# Patient Record
Sex: Female | Born: 1935 | Race: White | Hispanic: No | Smoking: Never smoker
Health system: Southern US, Community
[De-identification: ages and names within clinical notes are randomized; demographics above are authoritative.]

## PROBLEM LIST (undated history)

## (undated) DIAGNOSIS — R600 Localized edema: Secondary | ICD-10-CM

## (undated) DIAGNOSIS — N183 Chronic kidney disease, stage 3 unspecified: Secondary | ICD-10-CM

## (undated) DIAGNOSIS — E876 Hypokalemia: Secondary | ICD-10-CM

## (undated) DIAGNOSIS — Z803 Family history of malignant neoplasm of breast: Secondary | ICD-10-CM

## (undated) DIAGNOSIS — C4021 Malignant neoplasm of long bones of right lower limb: Secondary | ICD-10-CM

## (undated) DIAGNOSIS — I8393 Asymptomatic varicose veins of bilateral lower extremities: Secondary | ICD-10-CM

## (undated) DIAGNOSIS — I4510 Unspecified right bundle-branch block: Secondary | ICD-10-CM

## (undated) DIAGNOSIS — K219 Gastro-esophageal reflux disease without esophagitis: Secondary | ICD-10-CM

## (undated) DIAGNOSIS — I499 Cardiac arrhythmia, unspecified: Secondary | ICD-10-CM

## (undated) DIAGNOSIS — R609 Edema, unspecified: Secondary | ICD-10-CM

## (undated) DIAGNOSIS — A419 Sepsis, unspecified organism: Secondary | ICD-10-CM

## (undated) DIAGNOSIS — R238 Other skin changes: Secondary | ICD-10-CM

## (undated) DIAGNOSIS — Z8041 Family history of malignant neoplasm of ovary: Secondary | ICD-10-CM

## (undated) DIAGNOSIS — R001 Bradycardia, unspecified: Secondary | ICD-10-CM

## (undated) DIAGNOSIS — R233 Spontaneous ecchymoses: Secondary | ICD-10-CM

## (undated) DIAGNOSIS — I700 Atherosclerosis of aorta: Secondary | ICD-10-CM

## (undated) DIAGNOSIS — R2681 Unsteadiness on feet: Secondary | ICD-10-CM

## (undated) DIAGNOSIS — A4902 Methicillin resistant Staphylococcus aureus infection, unspecified site: Secondary | ICD-10-CM

## (undated) DIAGNOSIS — N390 Urinary tract infection, site not specified: Secondary | ICD-10-CM

## (undated) DIAGNOSIS — Z923 Personal history of irradiation: Secondary | ICD-10-CM

## (undated) DIAGNOSIS — E538 Deficiency of other specified B group vitamins: Secondary | ICD-10-CM

## (undated) DIAGNOSIS — I10 Essential (primary) hypertension: Secondary | ICD-10-CM

## (undated) DIAGNOSIS — I4821 Permanent atrial fibrillation: Secondary | ICD-10-CM

## (undated) DIAGNOSIS — E119 Type 2 diabetes mellitus without complications: Secondary | ICD-10-CM

## (undated) DIAGNOSIS — Z800 Family history of malignant neoplasm of digestive organs: Secondary | ICD-10-CM

## (undated) DIAGNOSIS — I4891 Unspecified atrial fibrillation: Secondary | ICD-10-CM

## (undated) DIAGNOSIS — D649 Anemia, unspecified: Secondary | ICD-10-CM

## (undated) DIAGNOSIS — H269 Unspecified cataract: Secondary | ICD-10-CM

## (undated) DIAGNOSIS — Z807 Family history of other malignant neoplasms of lymphoid, hematopoietic and related tissues: Secondary | ICD-10-CM

## (undated) DIAGNOSIS — C541 Malignant neoplasm of endometrium: Secondary | ICD-10-CM

## (undated) DIAGNOSIS — E039 Hypothyroidism, unspecified: Secondary | ICD-10-CM

## (undated) DIAGNOSIS — E785 Hyperlipidemia, unspecified: Secondary | ICD-10-CM

## (undated) DIAGNOSIS — H9190 Unspecified hearing loss, unspecified ear: Secondary | ICD-10-CM

## (undated) DIAGNOSIS — T7840XA Allergy, unspecified, initial encounter: Secondary | ICD-10-CM

## (undated) DIAGNOSIS — M79606 Pain in leg, unspecified: Secondary | ICD-10-CM

## (undated) DIAGNOSIS — N3946 Mixed incontinence: Secondary | ICD-10-CM

## (undated) DIAGNOSIS — I480 Paroxysmal atrial fibrillation: Secondary | ICD-10-CM

## (undated) DIAGNOSIS — Z9289 Personal history of other medical treatment: Secondary | ICD-10-CM

## (undated) DIAGNOSIS — M1990 Unspecified osteoarthritis, unspecified site: Secondary | ICD-10-CM

## (undated) DIAGNOSIS — Z7901 Long term (current) use of anticoagulants: Secondary | ICD-10-CM

## (undated) DIAGNOSIS — Z8719 Personal history of other diseases of the digestive system: Secondary | ICD-10-CM

## (undated) DIAGNOSIS — C8590 Non-Hodgkin lymphoma, unspecified, unspecified site: Secondary | ICD-10-CM

## (undated) DIAGNOSIS — E042 Nontoxic multinodular goiter: Secondary | ICD-10-CM

## (undated) HISTORY — DX: Essential (primary) hypertension: I10

## (undated) HISTORY — DX: Permanent atrial fibrillation: I48.21

## (undated) HISTORY — DX: Gastro-esophageal reflux disease without esophagitis: K21.9

## (undated) HISTORY — DX: Nontoxic multinodular goiter: E04.2

## (undated) HISTORY — PX: ABDOMINAL HYSTERECTOMY: SHX81

## (undated) HISTORY — DX: Allergy, unspecified, initial encounter: T78.40XA

## (undated) HISTORY — DX: Personal history of other medical treatment: Z92.89

## (undated) HISTORY — DX: Unspecified atrial fibrillation: I48.91

## (undated) HISTORY — DX: Family history of other malignant neoplasms of lymphoid, hematopoietic and related tissues: Z80.7

## (undated) HISTORY — DX: Malignant neoplasm of endometrium: C54.1

## (undated) HISTORY — DX: Malignant neoplasm of long bones of right lower limb: C40.21

## (undated) HISTORY — DX: Pain in leg, unspecified: M79.606

## (undated) HISTORY — PX: EYE SURGERY: SHX253

## (undated) HISTORY — DX: Family history of malignant neoplasm of digestive organs: Z80.0

## (undated) HISTORY — DX: Family history of malignant neoplasm of breast: Z80.3

## (undated) HISTORY — DX: Other skin changes: R23.8

## (undated) HISTORY — DX: Spontaneous ecchymoses: R23.3

## (undated) HISTORY — DX: Unspecified cataract: H26.9

## (undated) HISTORY — DX: Family history of malignant neoplasm of ovary: Z80.41

## (undated) HISTORY — DX: Unspecified osteoarthritis, unspecified site: M19.90

## (undated) HISTORY — PX: COLONOSCOPY: SHX174

---

## 2005-03-09 ENCOUNTER — Ambulatory Visit: Payer: Self-pay | Admitting: Unknown Physician Specialty

## 2006-03-23 ENCOUNTER — Ambulatory Visit: Payer: Self-pay | Admitting: Unknown Physician Specialty

## 2007-04-05 ENCOUNTER — Ambulatory Visit: Payer: Self-pay | Admitting: Unknown Physician Specialty

## 2008-04-10 ENCOUNTER — Ambulatory Visit: Payer: Self-pay | Admitting: Unknown Physician Specialty

## 2009-04-14 ENCOUNTER — Ambulatory Visit: Payer: Self-pay | Admitting: Unknown Physician Specialty

## 2010-02-11 ENCOUNTER — Ambulatory Visit: Payer: Self-pay | Admitting: Unknown Physician Specialty

## 2010-02-24 ENCOUNTER — Ambulatory Visit: Payer: Self-pay | Admitting: Unknown Physician Specialty

## 2010-02-26 ENCOUNTER — Ambulatory Visit: Payer: Self-pay | Admitting: Pain Medicine

## 2010-03-12 ENCOUNTER — Ambulatory Visit: Payer: Self-pay | Admitting: Pain Medicine

## 2010-04-17 ENCOUNTER — Ambulatory Visit: Payer: Self-pay | Admitting: Unknown Physician Specialty

## 2010-04-30 ENCOUNTER — Ambulatory Visit: Payer: Self-pay | Admitting: Pain Medicine

## 2010-05-09 ENCOUNTER — Encounter: Admission: RE | Admit: 2010-05-09 | Discharge: 2010-05-09 | Payer: Self-pay | Admitting: Orthopedic Surgery

## 2010-08-25 ENCOUNTER — Encounter: Admission: RE | Admit: 2010-08-25 | Discharge: 2010-08-25 | Payer: Self-pay | Admitting: Surgery

## 2010-10-20 ENCOUNTER — Encounter: Admission: RE | Admit: 2010-10-20 | Discharge: 2010-10-20 | Payer: Self-pay | Admitting: Unknown Physician Specialty

## 2010-11-11 ENCOUNTER — Encounter: Admission: RE | Admit: 2010-11-11 | Discharge: 2010-11-11 | Payer: Self-pay | Admitting: Unknown Physician Specialty

## 2011-01-02 ENCOUNTER — Encounter: Payer: Self-pay | Admitting: Unknown Physician Specialty

## 2011-02-11 ENCOUNTER — Ambulatory Visit: Payer: Self-pay | Admitting: Unknown Physician Specialty

## 2011-03-09 ENCOUNTER — Other Ambulatory Visit: Payer: Self-pay | Admitting: Unknown Physician Specialty

## 2011-03-09 ENCOUNTER — Other Ambulatory Visit: Payer: Self-pay | Admitting: Physician Assistant

## 2011-03-09 DIAGNOSIS — M549 Dorsalgia, unspecified: Secondary | ICD-10-CM

## 2011-03-11 ENCOUNTER — Ambulatory Visit
Admission: RE | Admit: 2011-03-11 | Discharge: 2011-03-11 | Disposition: A | Discharge status: Home / Self Care | Payer: Medicare Other | Source: Ambulatory Visit | Attending: Physician Assistant | Admitting: Physician Assistant

## 2011-03-11 DIAGNOSIS — M549 Dorsalgia, unspecified: Secondary | ICD-10-CM

## 2011-04-16 ENCOUNTER — Encounter (INDEPENDENT_AMBULATORY_CARE_PROVIDER_SITE_OTHER): Payer: Self-pay | Admitting: Surgery

## 2011-04-19 ENCOUNTER — Ambulatory Visit: Payer: Self-pay | Admitting: Unknown Physician Specialty

## 2011-06-08 ENCOUNTER — Other Ambulatory Visit: Payer: Self-pay | Admitting: Physician Assistant

## 2011-06-08 DIAGNOSIS — M4800 Spinal stenosis, site unspecified: Secondary | ICD-10-CM

## 2011-06-08 DIAGNOSIS — M5416 Radiculopathy, lumbar region: Secondary | ICD-10-CM

## 2011-06-10 ENCOUNTER — Ambulatory Visit
Admission: RE | Admit: 2011-06-10 | Discharge: 2011-06-10 | Disposition: A | Discharge status: Home / Self Care | Payer: Medicare Other | Source: Ambulatory Visit | Attending: Physician Assistant | Admitting: Physician Assistant

## 2011-06-10 DIAGNOSIS — M4800 Spinal stenosis, site unspecified: Secondary | ICD-10-CM

## 2011-06-10 DIAGNOSIS — M5416 Radiculopathy, lumbar region: Secondary | ICD-10-CM

## 2011-06-28 ENCOUNTER — Other Ambulatory Visit: Payer: Self-pay | Admitting: Unknown Physician Specialty

## 2011-06-28 DIAGNOSIS — M4800 Spinal stenosis, site unspecified: Secondary | ICD-10-CM

## 2011-06-28 DIAGNOSIS — M5416 Radiculopathy, lumbar region: Secondary | ICD-10-CM

## 2011-07-05 ENCOUNTER — Ambulatory Visit
Admission: RE | Admit: 2011-07-05 | Discharge: 2011-07-05 | Disposition: A | Discharge status: Home / Self Care | Payer: Medicare Other | Source: Ambulatory Visit | Attending: Unknown Physician Specialty | Admitting: Unknown Physician Specialty

## 2011-07-05 DIAGNOSIS — M4800 Spinal stenosis, site unspecified: Secondary | ICD-10-CM

## 2011-07-05 DIAGNOSIS — M5416 Radiculopathy, lumbar region: Secondary | ICD-10-CM

## 2011-07-05 MED ORDER — METHYLPREDNISOLONE ACETATE 40 MG/ML INJ SUSP (RADIOLOG
120.0000 mg | Freq: Once | INTRAMUSCULAR | Status: AC
  Administered 2011-07-05: 120 mg via EPIDURAL

## 2011-07-05 MED ORDER — IOHEXOL 180 MG/ML  SOLN
1.0000 mL | Freq: Once | INTRAMUSCULAR | Status: AC | PRN
  Administered 2011-07-05: 1 mL via EPIDURAL

## 2011-07-05 NOTE — Progress Notes (Signed)
Pt uncomfortable post injection, cold pack to injections site, d/c instructions reviewed with pt.  Will change PT appointment to later in the week.

## 2011-09-01 ENCOUNTER — Encounter (INDEPENDENT_AMBULATORY_CARE_PROVIDER_SITE_OTHER): Payer: Self-pay | Admitting: Surgery

## 2011-09-01 ENCOUNTER — Other Ambulatory Visit (INDEPENDENT_AMBULATORY_CARE_PROVIDER_SITE_OTHER): Payer: Self-pay | Admitting: Surgery

## 2011-09-01 DIAGNOSIS — E042 Nontoxic multinodular goiter: Secondary | ICD-10-CM

## 2011-09-03 ENCOUNTER — Ambulatory Visit
Admission: RE | Admit: 2011-09-03 | Discharge: 2011-09-03 | Disposition: A | Discharge status: Home / Self Care | Payer: Medicare Other | Source: Ambulatory Visit | Attending: Surgery | Admitting: Surgery

## 2011-09-03 DIAGNOSIS — E042 Nontoxic multinodular goiter: Secondary | ICD-10-CM

## 2011-09-07 ENCOUNTER — Encounter (INDEPENDENT_AMBULATORY_CARE_PROVIDER_SITE_OTHER): Payer: Self-pay | Admitting: Surgery

## 2011-09-07 ENCOUNTER — Ambulatory Visit (INDEPENDENT_AMBULATORY_CARE_PROVIDER_SITE_OTHER): Payer: Medicare Other | Admitting: Surgery

## 2011-09-07 VITALS — BP 144/68 | HR 72 | Temp 97.4°F | Resp 16 | Ht 66.0 in | Wt 177.2 lb

## 2011-09-07 DIAGNOSIS — E042 Nontoxic multinodular goiter: Secondary | ICD-10-CM | POA: Insufficient documentation

## 2011-09-07 LAB — TSH: TSH: 1.44 u[IU]/mL (ref 0.350–4.500)

## 2011-09-07 NOTE — Progress Notes (Signed)
Visit Diagnoses: 1. Multinodular goiter (nontoxic)     HISTORY: Patient is a 77 year old female followed in our practice for bilateral thyroid nodules. Patient has had previous fine needle aspiration biopsy of the dominant left nodule performed in [place]. This was benign. Patient has now been followed with sequential ultrasounds. Her most recent study was on September 03, 2011. This showed the dominant nodule in the right lobe to measure 19 mm. It was mostly cystic and extends into the isthmus. The left thyroid nodule is 16 mm in size and poorly marginated. This is slightly smaller than on the prior study. No convincing change in size of the dominant bilateral thyroid lesions was noted by the radiologist.  Patient returns today to review these results and for physical examination.   PERTINENT REVIEW OF SYSTEMS: Patient continues to note occasional globus sensation. She has in the frequent episodes of dysphagia. She denies tremors. She denies palpitations. She has not noted any palpable lesions on self-examination.   EXAM: HEENT: normocephalic; pupils equal and reactive; sclerae clear; dentition good; mucous membranes moist NECK:  Left thyroid lobe is slightly enlarged and mildly firm. There is a dominant nodule in the thyroid isthmus measuring approximately 1.5 cm. It is minimally tender and mobile with swallowing. Right lobe is without dominant mass; symmetric on extension; no palpable anterior or posterior cervical lymphadenopathy; no supraclavicular masses; no tenderness CHEST: clear to auscultation bilaterally without rales, rhonchi, or wheezes CARDIAC: regular rate and rhythm without significant murmur; peripheral pulses are full EXT:  non-tender without edema; no deformity NEURO: no gross focal deficits; no sign of tremor   IMPRESSION: Bilateral thyroid nodules, clinically stable   PLAN: The patient will have a TSH level drawn today. I have asked her to have a repeat thyroid  ultrasound in one year to assure that the bilateral nodules remained stable. She will return to see me at that time for repeat physical examination. At this point there is no indication for surgical intervention.   Velora Heckler, MD, FACS General & Endocrine Surgery [facility]

## 2011-09-09 ENCOUNTER — Other Ambulatory Visit: Payer: Self-pay | Admitting: Physician Assistant

## 2011-09-09 DIAGNOSIS — M549 Dorsalgia, unspecified: Secondary | ICD-10-CM

## 2011-09-09 NOTE — Progress Notes (Signed)
Patient called with results.

## 2011-09-20 ENCOUNTER — Ambulatory Visit
Admission: RE | Admit: 2011-09-20 | Discharge: 2011-09-20 | Disposition: A | Discharge status: Home / Self Care | Payer: Medicare Other | Source: Ambulatory Visit | Attending: Physician Assistant | Admitting: Physician Assistant

## 2011-09-20 DIAGNOSIS — M549 Dorsalgia, unspecified: Secondary | ICD-10-CM

## 2011-09-20 MED ORDER — IOHEXOL 180 MG/ML  SOLN
1.0000 mL | Freq: Once | INTRAMUSCULAR | Status: AC | PRN
  Administered 2011-09-20: 1 mL via EPIDURAL

## 2011-09-20 MED ORDER — METHYLPREDNISOLONE ACETATE 40 MG/ML INJ SUSP (RADIOLOG
120.0000 mg | Freq: Once | INTRAMUSCULAR | Status: AC
  Administered 2011-09-20: 120 mg via EPIDURAL

## 2011-09-21 ENCOUNTER — Other Ambulatory Visit: Payer: Medicare Other

## 2011-11-12 ENCOUNTER — Other Ambulatory Visit: Payer: Self-pay | Admitting: Physician Assistant

## 2011-11-12 DIAGNOSIS — M545 Low back pain, unspecified: Secondary | ICD-10-CM

## 2011-11-26 ENCOUNTER — Ambulatory Visit
Admission: RE | Admit: 2011-11-26 | Discharge: 2011-11-26 | Disposition: A | Discharge status: Home / Self Care | Payer: Medicare Other | Source: Ambulatory Visit | Attending: Physician Assistant | Admitting: Physician Assistant

## 2011-11-26 VITALS — BP 178/83 | HR 65

## 2011-11-26 DIAGNOSIS — M545 Low back pain: Secondary | ICD-10-CM

## 2011-11-26 MED ORDER — IOHEXOL 180 MG/ML  SOLN
1.0000 mL | Freq: Once | INTRAMUSCULAR | Status: AC | PRN
  Administered 2011-11-26: 1 mL via EPIDURAL

## 2011-11-26 MED ORDER — METHYLPREDNISOLONE ACETATE 40 MG/ML INJ SUSP (RADIOLOG
120.0000 mg | Freq: Once | INTRAMUSCULAR | Status: AC
  Administered 2011-11-26: 120 mg via EPIDURAL

## 2012-04-19 ENCOUNTER — Ambulatory Visit: Payer: Self-pay | Admitting: Unknown Physician Specialty

## 2012-08-25 ENCOUNTER — Telehealth (INDEPENDENT_AMBULATORY_CARE_PROVIDER_SITE_OTHER): Payer: Self-pay

## 2012-08-25 NOTE — Telephone Encounter (Signed)
LMOM pt due for u/s at [facility] prior to next appt.  Awaiting pt to call back. Order is in epic.

## 2012-09-19 ENCOUNTER — Ambulatory Visit
Admission: RE | Admit: 2012-09-19 | Discharge: 2012-09-19 | Disposition: A | Discharge status: Home / Self Care | Payer: Medicare Other | Source: Ambulatory Visit | Attending: Surgery | Admitting: Surgery

## 2012-09-19 DIAGNOSIS — E042 Nontoxic multinodular goiter: Secondary | ICD-10-CM

## 2012-09-25 ENCOUNTER — Encounter (INDEPENDENT_AMBULATORY_CARE_PROVIDER_SITE_OTHER): Payer: Self-pay | Admitting: Surgery

## 2012-09-25 ENCOUNTER — Ambulatory Visit (INDEPENDENT_AMBULATORY_CARE_PROVIDER_SITE_OTHER): Payer: Medicare Other | Admitting: Surgery

## 2012-09-25 VITALS — BP 134/62 | HR 66 | Temp 97.7°F | Resp 18 | Ht 66.0 in | Wt 181.4 lb

## 2012-09-25 DIAGNOSIS — E042 Nontoxic multinodular goiter: Secondary | ICD-10-CM

## 2012-09-25 NOTE — Progress Notes (Signed)
General Surgery [facility]  Visit Diagnoses: 1. Multinodular goiter (nontoxic)     HISTORY: Patient is a 78 year old white female followed in our practice for bilateral thyroid nodules. She has never been on thyroid hormone supplementation. TSH level has ranged between 1.0 in 2.0. She had undergone previous fine-needle aspiration cytology which was benign. She has been followed with sequential ultrasound scanning.  Patient underwent followup thyroid ultrasound last week. This demonstrated an essentially normal-sized thyroid gland with the right lobe measuring 3.9 cm in the left lobe measuring 4.5 cm. The dominant nodule in the left lobe measures 2.4 cm, is isoechoic, and is visually stable to the radiologist. The right lobe nodule has decreased in size to 1.2 cm. The previously identified cystic nodule in the isthmus has resolved. No new nodules were identified.  PERTINENT REVIEW OF SYSTEMS: Patient has occasional episodes of dysphagia. She notes chronic sinus drainage. She denies pain. She denies any new palpable masses.  EXAM: HEENT: normocephalic; pupils equal and reactive; sclerae clear; dentition good; mucous membranes moist NECK:  Thyroid gland is firm, dominant nodule on the left is mobile with swallowing; symmetric on extension; no palpable anterior or posterior cervical lymphadenopathy; no supraclavicular masses; no tenderness CHEST: clear to auscultation bilaterally without rales, rhonchi, or wheezes CARDIAC: regular rate and rhythm without significant murmur; peripheral pulses are full EXT:  non-tender without edema; no deformity NEURO: no gross focal deficits; no sign of tremor   IMPRESSION: Bilateral thyroid nodules, clinically stable  PLAN: Patient has a stable clinical examination and radiographic assessment. I think it is safe to wait 2 years before we repeat her thyroid ultrasound. She will continue followup with her endocrinologist. Patient will  return for physical examination following her ultrasound test in 2 years.  Velora Heckler, MD, [facility] Office: [phone]

## 2013-04-20 ENCOUNTER — Ambulatory Visit: Payer: Self-pay | Admitting: Unknown Physician Specialty

## 2014-04-10 ENCOUNTER — Emergency Department: Payer: Self-pay | Admitting: Emergency Medicine

## 2014-04-10 LAB — COMPREHENSIVE METABOLIC PANEL
ALBUMIN: 3.7 g/dL (ref 3.4–5.0)
ALK PHOS: 100 U/L
ALT: 26 U/L (ref 12–78)
ANION GAP: 6 — AB (ref 7–16)
BUN: 17 mg/dL (ref 7–18)
Bilirubin,Total: 0.4 mg/dL (ref 0.2–1.0)
CHLORIDE: 97 mmol/L — AB (ref 98–107)
CREATININE: 0.85 mg/dL (ref 0.60–1.30)
Calcium, Total: 8.8 mg/dL (ref 8.5–10.1)
Co2: 33 mmol/L — ABNORMAL HIGH (ref 21–32)
EGFR (African American): >60
GLUCOSE: 175 mg/dL — AB (ref 65–99)
Osmolality: 278 (ref 275–301)
Potassium: 3.1 mmol/L — ABNORMAL LOW (ref 3.5–5.1)
SGOT(AST): 25 U/L (ref 15–37)
Sodium: 136 mmol/L (ref 136–145)
TOTAL PROTEIN: 7.6 g/dL (ref 6.4–8.2)

## 2014-04-10 LAB — CBC
HCT: 38.4 % (ref 35.0–47.0)
HGB: 12.7 g/dL (ref 12.0–16.0)
MCH: 29.4 pg (ref 26.0–34.0)
MCHC: 33.1 g/dL (ref 32.0–36.0)
MCV: 89 fL (ref 80–100)
Platelet: 264 10*3/uL (ref 150–440)
RBC: 4.33 10*6/uL (ref 3.80–5.20)
RDW: 13.8 % (ref 11.5–14.5)
WBC: 11.6 10*3/uL — ABNORMAL HIGH (ref 3.6–11.0)

## 2014-04-10 LAB — URINALYSIS, COMPLETE
BACTERIA: NONE SEEN
BILIRUBIN, UR: NEGATIVE
Glucose,UR: NEGATIVE mg/dL (ref 0–75)
KETONE: NEGATIVE
Nitrite: NEGATIVE
PH: 7 (ref 4.5–8.0)
Protein: NEGATIVE
RBC,UR: 13 /HPF (ref 0–5)
SQUAMOUS EPITHELIAL: NONE SEEN
Specific Gravity: 1.012 (ref 1.003–1.030)
WBC UR: 24 /HPF (ref 0–5)

## 2014-05-09 ENCOUNTER — Ambulatory Visit: Payer: Self-pay | Admitting: Internal Medicine

## 2014-05-16 ENCOUNTER — Ambulatory Visit: Payer: Self-pay | Admitting: Gastroenterology

## 2014-06-13 DIAGNOSIS — E538 Deficiency of other specified B group vitamins: Secondary | ICD-10-CM | POA: Insufficient documentation

## 2014-09-26 ENCOUNTER — Other Ambulatory Visit (INDEPENDENT_AMBULATORY_CARE_PROVIDER_SITE_OTHER): Payer: Self-pay | Admitting: *Deleted

## 2014-09-26 ENCOUNTER — Other Ambulatory Visit (INDEPENDENT_AMBULATORY_CARE_PROVIDER_SITE_OTHER): Payer: Self-pay

## 2014-09-26 DIAGNOSIS — E042 Nontoxic multinodular goiter: Secondary | ICD-10-CM

## 2014-10-29 ENCOUNTER — Other Ambulatory Visit: Payer: 59

## 2014-10-29 ENCOUNTER — Ambulatory Visit
Admission: RE | Admit: 2014-10-29 | Discharge: 2014-10-29 | Disposition: A | Discharge status: Home / Self Care | Payer: Medicare Other | Source: Ambulatory Visit | Attending: Surgery | Admitting: Surgery

## 2014-10-29 DIAGNOSIS — E042 Nontoxic multinodular goiter: Secondary | ICD-10-CM

## 2014-11-11 ENCOUNTER — Other Ambulatory Visit: Payer: 59

## 2015-02-28 DIAGNOSIS — M1711 Unilateral primary osteoarthritis, right knee: Secondary | ICD-10-CM | POA: Insufficient documentation

## 2015-02-28 DIAGNOSIS — S86911D Strain of unspecified muscle(s) and tendon(s) at lower leg level, right leg, subsequent encounter: Secondary | ICD-10-CM | POA: Insufficient documentation

## 2015-03-11 DIAGNOSIS — K219 Gastro-esophageal reflux disease without esophagitis: Secondary | ICD-10-CM | POA: Insufficient documentation

## 2015-03-11 DIAGNOSIS — K644 Residual hemorrhoidal skin tags: Secondary | ICD-10-CM | POA: Insufficient documentation

## 2015-03-11 DIAGNOSIS — B351 Tinea unguium: Secondary | ICD-10-CM | POA: Insufficient documentation

## 2015-03-28 ENCOUNTER — Other Ambulatory Visit: Payer: Self-pay | Admitting: Internal Medicine

## 2015-03-28 DIAGNOSIS — Z1231 Encounter for screening mammogram for malignant neoplasm of breast: Secondary | ICD-10-CM

## 2015-05-13 ENCOUNTER — Other Ambulatory Visit: Payer: Self-pay | Admitting: Internal Medicine

## 2015-05-13 ENCOUNTER — Ambulatory Visit
Admission: RE | Admit: 2015-05-13 | Discharge: 2015-05-13 | Disposition: A | Discharge status: Home / Self Care | Payer: Medicare Other | Source: Ambulatory Visit | Attending: Internal Medicine | Admitting: Internal Medicine

## 2015-05-13 DIAGNOSIS — Z1231 Encounter for screening mammogram for malignant neoplasm of breast: Secondary | ICD-10-CM | POA: Insufficient documentation

## 2015-05-13 DIAGNOSIS — R922 Inconclusive mammogram: Secondary | ICD-10-CM | POA: Diagnosis not present

## 2015-06-11 DIAGNOSIS — N3946 Mixed incontinence: Secondary | ICD-10-CM | POA: Insufficient documentation

## 2015-09-17 ENCOUNTER — Other Ambulatory Visit: Payer: Self-pay | Admitting: Internal Medicine

## 2015-09-17 DIAGNOSIS — R748 Abnormal levels of other serum enzymes: Secondary | ICD-10-CM

## 2015-09-24 ENCOUNTER — Ambulatory Visit
Admission: RE | Admit: 2015-09-24 | Discharge: 2015-09-24 | Disposition: A | Discharge status: Home / Self Care | Payer: Medicare Other | Source: Ambulatory Visit | Attending: Internal Medicine | Admitting: Internal Medicine

## 2015-09-24 DIAGNOSIS — R748 Abnormal levels of other serum enzymes: Secondary | ICD-10-CM | POA: Insufficient documentation

## 2016-01-27 DIAGNOSIS — E1122 Type 2 diabetes mellitus with diabetic chronic kidney disease: Secondary | ICD-10-CM | POA: Insufficient documentation

## 2016-01-29 ENCOUNTER — Other Ambulatory Visit: Payer: Self-pay | Admitting: Internal Medicine

## 2016-01-29 DIAGNOSIS — Z1231 Encounter for screening mammogram for malignant neoplasm of breast: Secondary | ICD-10-CM

## 2016-03-17 DIAGNOSIS — I83893 Varicose veins of bilateral lower extremities with other complications: Secondary | ICD-10-CM | POA: Insufficient documentation

## 2016-05-13 ENCOUNTER — Other Ambulatory Visit: Payer: Self-pay | Admitting: Internal Medicine

## 2016-05-13 ENCOUNTER — Ambulatory Visit
Admission: RE | Admit: 2016-05-13 | Discharge: 2016-05-13 | Disposition: A | Discharge status: Home / Self Care | Payer: Medicare Other | Source: Ambulatory Visit | Attending: Internal Medicine | Admitting: Internal Medicine

## 2016-05-13 DIAGNOSIS — Z1231 Encounter for screening mammogram for malignant neoplasm of breast: Secondary | ICD-10-CM | POA: Diagnosis present

## 2016-08-09 DIAGNOSIS — M23301 Other meniscus derangements, unspecified lateral meniscus, left knee: Secondary | ICD-10-CM | POA: Insufficient documentation

## 2016-08-09 DIAGNOSIS — M1712 Unilateral primary osteoarthritis, left knee: Secondary | ICD-10-CM | POA: Insufficient documentation

## 2016-10-13 DIAGNOSIS — I4821 Permanent atrial fibrillation: Secondary | ICD-10-CM

## 2016-10-13 HISTORY — DX: Permanent atrial fibrillation: I48.21

## 2016-10-28 ENCOUNTER — Inpatient Hospital Stay
Admission: EM | Admit: 2016-10-28 | Discharge: 2016-11-02 | DRG: 871 | Disposition: A | Discharge status: Home Health | Payer: Medicare Other | Attending: Internal Medicine | Admitting: Internal Medicine

## 2016-10-28 ENCOUNTER — Encounter: Payer: Self-pay | Admitting: Emergency Medicine

## 2016-10-28 ENCOUNTER — Emergency Department: Payer: Medicare Other

## 2016-10-28 DIAGNOSIS — N3000 Acute cystitis without hematuria: Secondary | ICD-10-CM | POA: Diagnosis present

## 2016-10-28 DIAGNOSIS — M1990 Unspecified osteoarthritis, unspecified site: Secondary | ICD-10-CM | POA: Diagnosis present

## 2016-10-28 DIAGNOSIS — Z79899 Other long term (current) drug therapy: Secondary | ICD-10-CM

## 2016-10-28 DIAGNOSIS — Z7984 Long term (current) use of oral hypoglycemic drugs: Secondary | ICD-10-CM | POA: Diagnosis not present

## 2016-10-28 DIAGNOSIS — A419 Sepsis, unspecified organism: Secondary | ICD-10-CM | POA: Diagnosis present

## 2016-10-28 DIAGNOSIS — N170 Acute kidney failure with tubular necrosis: Secondary | ICD-10-CM | POA: Diagnosis present

## 2016-10-28 DIAGNOSIS — E119 Type 2 diabetes mellitus without complications: Secondary | ICD-10-CM | POA: Diagnosis present

## 2016-10-28 DIAGNOSIS — M6281 Muscle weakness (generalized): Secondary | ICD-10-CM

## 2016-10-28 DIAGNOSIS — R2681 Unsteadiness on feet: Secondary | ICD-10-CM

## 2016-10-28 DIAGNOSIS — R000 Tachycardia, unspecified: Secondary | ICD-10-CM | POA: Diagnosis present

## 2016-10-28 DIAGNOSIS — Z8041 Family history of malignant neoplasm of ovary: Secondary | ICD-10-CM | POA: Diagnosis not present

## 2016-10-28 DIAGNOSIS — A4151 Sepsis due to Escherichia coli [E. coli]: Principal | ICD-10-CM | POA: Diagnosis present

## 2016-10-28 DIAGNOSIS — R600 Localized edema: Secondary | ICD-10-CM | POA: Diagnosis present

## 2016-10-28 DIAGNOSIS — K219 Gastro-esophageal reflux disease without esophagitis: Secondary | ICD-10-CM | POA: Diagnosis present

## 2016-10-28 DIAGNOSIS — I480 Paroxysmal atrial fibrillation: Secondary | ICD-10-CM | POA: Diagnosis present

## 2016-10-28 DIAGNOSIS — I4891 Unspecified atrial fibrillation: Secondary | ICD-10-CM | POA: Diagnosis not present

## 2016-10-28 DIAGNOSIS — R6520 Severe sepsis without septic shock: Secondary | ICD-10-CM | POA: Diagnosis present

## 2016-10-28 DIAGNOSIS — Z803 Family history of malignant neoplasm of breast: Secondary | ICD-10-CM

## 2016-10-28 DIAGNOSIS — R630 Anorexia: Secondary | ICD-10-CM | POA: Diagnosis present

## 2016-10-28 DIAGNOSIS — I10 Essential (primary) hypertension: Secondary | ICD-10-CM

## 2016-10-28 DIAGNOSIS — I959 Hypotension, unspecified: Secondary | ICD-10-CM | POA: Diagnosis present

## 2016-10-28 DIAGNOSIS — I872 Venous insufficiency (chronic) (peripheral): Secondary | ICD-10-CM | POA: Diagnosis present

## 2016-10-28 DIAGNOSIS — D649 Anemia, unspecified: Secondary | ICD-10-CM

## 2016-10-28 DIAGNOSIS — R6510 Systemic inflammatory response syndrome (SIRS) of non-infectious origin without acute organ dysfunction: Secondary | ICD-10-CM | POA: Diagnosis not present

## 2016-10-28 LAB — CBC WITH DIFFERENTIAL/PLATELET
BASOS ABS: 0 10*3/uL (ref 0–0.1)
Basophils Relative: 0 %
Eosinophils Absolute: 0 10*3/uL (ref 0–0.7)
Eosinophils Relative: 0 %
HEMATOCRIT: 35.9 % (ref 35.0–47.0)
HEMOGLOBIN: 11.9 g/dL — AB (ref 12.0–16.0)
LYMPHS PCT: 2 %
Lymphs Abs: 0.2 10*3/uL — ABNORMAL LOW (ref 1.0–3.6)
MCH: 29.7 pg (ref 26.0–34.0)
MCHC: 33.2 g/dL (ref 32.0–36.0)
MCV: 89.5 fL (ref 80.0–100.0)
Monocytes Absolute: 0.5 10*3/uL (ref 0.2–0.9)
Monocytes Relative: 4 %
NEUTROS ABS: 13.5 10*3/uL — AB (ref 1.4–6.5)
Neutrophils Relative %: 94 %
Platelets: 146 10*3/uL — ABNORMAL LOW (ref 150–440)
RBC: 4.02 MIL/uL (ref 3.80–5.20)
RDW: 15.2 % — ABNORMAL HIGH (ref 11.5–14.5)
WBC: 14.3 10*3/uL — AB (ref 3.6–11.0)

## 2016-10-28 LAB — COMPREHENSIVE METABOLIC PANEL
ALBUMIN: 3.1 g/dL — AB (ref 3.5–5.0)
ALT: 14 U/L (ref 14–54)
ANION GAP: 12 (ref 5–15)
AST: 18 U/L (ref 15–41)
Alkaline Phosphatase: 71 U/L (ref 38–126)
BILIRUBIN TOTAL: 0.8 mg/dL (ref 0.3–1.2)
BUN: 30 mg/dL — ABNORMAL HIGH (ref 6–20)
CO2: 26 mmol/L (ref 22–32)
Calcium: 8.5 mg/dL — ABNORMAL LOW (ref 8.9–10.3)
Chloride: 97 mmol/L — ABNORMAL LOW (ref 101–111)
Creatinine, Ser: 1.7 mg/dL — ABNORMAL HIGH (ref 0.44–1.00)
GFR calc Af Amer: 32 mL/min — ABNORMAL LOW (ref >60)
GFR, EST NON AFRICAN AMERICAN: 27 mL/min — AB (ref >60)
Glucose, Bld: 137 mg/dL — ABNORMAL HIGH (ref 65–99)
POTASSIUM: 2.7 mmol/L — AB (ref 3.5–5.1)
Sodium: 135 mmol/L (ref 135–145)
TOTAL PROTEIN: 6.4 g/dL — AB (ref 6.5–8.1)

## 2016-10-28 LAB — INFLUENZA PANEL BY PCR (TYPE A & B)
Influenza A By PCR: NEGATIVE
Influenza B By PCR: NEGATIVE

## 2016-10-28 LAB — POTASSIUM: Potassium: 4 mmol/L (ref 3.5–5.1)

## 2016-10-28 LAB — MRSA PCR SCREENING: MRSA by PCR: NEGATIVE

## 2016-10-28 LAB — URINALYSIS COMPLETE WITH MICROSCOPIC (ARMC ONLY)
Bilirubin Urine: NEGATIVE
Glucose, UA: NEGATIVE mg/dL
KETONES UR: NEGATIVE mg/dL
NITRITE: POSITIVE — AB
PH: 5 (ref 5.0–8.0)
PROTEIN: 100 mg/dL — AB
SPECIFIC GRAVITY, URINE: 1.011 (ref 1.005–1.030)

## 2016-10-28 LAB — LACTIC ACID, PLASMA: LACTIC ACID, VENOUS: 1.6 mmol/L (ref 0.5–1.9)

## 2016-10-28 LAB — TROPONIN I: Troponin I: 0.05 ng/mL (ref <0.03)

## 2016-10-28 MED ORDER — TRAMADOL HCL 50 MG PO TABS
50.0000 mg | ORAL_TABLET | Freq: Four times a day (QID) | ORAL | Status: DC | PRN
  Administered 2016-10-28 – 2016-10-30 (×2): 50 mg via ORAL
  Filled 2016-10-28 (×3): qty 1

## 2016-10-28 MED ORDER — SODIUM CHLORIDE 0.9 % IV BOLUS (SEPSIS)
1000.0000 mL | Freq: Once | INTRAVENOUS | Status: AC
  Administered 2016-10-28: 1000 mL via INTRAVENOUS

## 2016-10-28 MED ORDER — SODIUM CHLORIDE 0.9 % IV SOLN
INTRAVENOUS | Status: DC
  Administered 2016-10-28 – 2016-10-29 (×2): via INTRAVENOUS

## 2016-10-28 MED ORDER — MORPHINE SULFATE (PF) 4 MG/ML IV SOLN
2.0000 mg | Freq: Once | INTRAVENOUS | Status: AC
Start: 2016-10-28 — End: 2016-10-28
  Administered 2016-10-28: 2 mg via INTRAVENOUS
  Filled 2016-10-28: qty 1

## 2016-10-28 MED ORDER — SODIUM CHLORIDE 0.9 % IV SOLN
30.0000 meq | Freq: Once | INTRAVENOUS | Status: AC
  Administered 2016-10-28: 30 meq via INTRAVENOUS
  Filled 2016-10-28: qty 15

## 2016-10-28 MED ORDER — DEXTROSE 5 % IV SOLN: 1.0000 g | INTRAVENOUS | Status: DC

## 2016-10-28 MED ORDER — CEFTRIAXONE SODIUM-DEXTROSE 1-3.74 GM-% IV SOLR
1.0000 g | INTRAVENOUS | Status: DC
  Filled 2016-10-28: qty 50

## 2016-10-28 MED ORDER — SODIUM CHLORIDE 0.9 % IV BOLUS (SEPSIS)
250.0000 mL | Freq: Once | INTRAVENOUS | Status: AC
  Administered 2016-10-28: 250 mL via INTRAVENOUS

## 2016-10-28 MED ORDER — POTASSIUM CHLORIDE 20 MEQ PO PACK: 20.0000 meq | PACK | Freq: Two times a day (BID) | ORAL | Status: DC

## 2016-10-28 MED ORDER — METOPROLOL SUCCINATE ER 50 MG PO TB24: 25.0000 mg | ORAL_TABLET | Freq: Every day | ORAL | Status: DC

## 2016-10-28 MED ORDER — POTASSIUM CHLORIDE 20 MEQ PO PACK
20.0000 meq | PACK | Freq: Two times a day (BID) | ORAL | Status: DC
  Administered 2016-10-29: 20 meq via ORAL
  Filled 2016-10-28: qty 1

## 2016-10-28 MED ORDER — DEXTROSE 5 % IV SOLN: 1.0000 g | Freq: Once | INTRAVENOUS | Status: DC

## 2016-10-28 MED ORDER — POTASSIUM CHLORIDE IN NACL 40-0.9 MEQ/L-% IV SOLN
INTRAVENOUS | Status: DC
  Administered 2016-10-28 – 2016-10-29 (×2): 100 mL/h via INTRAVENOUS
  Filled 2016-10-28 (×4): qty 1000

## 2016-10-28 MED ORDER — ACETAMINOPHEN 325 MG PO TABS
650.0000 mg | ORAL_TABLET | Freq: Four times a day (QID) | ORAL | Status: DC | PRN
  Administered 2016-10-29 – 2016-10-31 (×2): 650 mg via ORAL
  Filled 2016-10-28 (×3): qty 2

## 2016-10-28 MED ORDER — TERAZOSIN HCL 2 MG PO CAPS
2.0000 mg | ORAL_CAPSULE | Freq: Every day | ORAL | Status: DC
  Administered 2016-10-28 – 2016-11-01 (×5): 2 mg via ORAL
  Filled 2016-10-28 (×5): qty 1

## 2016-10-28 MED ORDER — ACETAMINOPHEN 650 MG RE SUPP: 650.0000 mg | Freq: Four times a day (QID) | RECTAL | Status: DC | PRN

## 2016-10-28 MED ORDER — ACETAMINOPHEN 325 MG PO TABS
650.0000 mg | ORAL_TABLET | Freq: Once | ORAL | Status: AC
  Administered 2016-10-28: 650 mg via ORAL
  Filled 2016-10-28: qty 2

## 2016-10-28 MED ORDER — ENOXAPARIN SODIUM 30 MG/0.3ML ~~LOC~~ SOLN
30.0000 mg | SUBCUTANEOUS | Status: DC
  Administered 2016-10-28: 30 mg via SUBCUTANEOUS
  Filled 2016-10-28: qty 0.3

## 2016-10-28 MED ORDER — METOPROLOL TARTRATE 5 MG/5ML IV SOLN
5.0000 mg | Freq: Once | INTRAVENOUS | Status: AC
  Administered 2016-10-28: 5 mg via INTRAVENOUS
  Filled 2016-10-28: qty 5

## 2016-10-28 MED ORDER — ONDANSETRON HCL 4 MG/2ML IJ SOLN
4.0000 mg | Freq: Four times a day (QID) | INTRAMUSCULAR | Status: DC | PRN
  Administered 2016-10-29 – 2016-10-30 (×3): 4 mg via INTRAVENOUS
  Filled 2016-10-28 (×3): qty 2

## 2016-10-28 MED ORDER — POTASSIUM CHLORIDE 20 MEQ PO PACK
40.0000 meq | PACK | Freq: Once | ORAL | Status: AC
  Administered 2016-10-28: 40 meq via ORAL
  Filled 2016-10-28: qty 2

## 2016-10-28 MED ORDER — SODIUM CHLORIDE 0.9 % IV BOLUS (SEPSIS)
1000.0000 mL | Freq: Once | INTRAVENOUS | Status: AC
Start: 2016-10-28 — End: 2016-10-28
  Administered 2016-10-28: 1000 mL via INTRAVENOUS

## 2016-10-28 MED ORDER — DILTIAZEM HCL ER COATED BEADS 240 MG PO CP24
240.0000 mg | ORAL_CAPSULE | Freq: Every day | ORAL | Status: DC
  Administered 2016-10-29 – 2016-10-31 (×3): 240 mg via ORAL
  Filled 2016-10-28 (×4): qty 1

## 2016-10-28 MED ORDER — ONDANSETRON HCL 4 MG PO TABS: 4.0000 mg | ORAL_TABLET | Freq: Four times a day (QID) | ORAL | Status: DC | PRN

## 2016-10-28 MED ORDER — CEFTRIAXONE SODIUM-DEXTROSE 1-3.74 GM-% IV SOLR
1.0000 g | Freq: Once | INTRAVENOUS | Status: AC
  Administered 2016-10-28: 1 g via INTRAVENOUS
  Filled 2016-10-28: qty 50

## 2016-10-28 MED ORDER — PANTOPRAZOLE SODIUM 40 MG PO TBEC
40.0000 mg | DELAYED_RELEASE_TABLET | Freq: Every day | ORAL | Status: DC
  Administered 2016-10-29 – 2016-11-02 (×5): 40 mg via ORAL
  Filled 2016-10-28 (×5): qty 1

## 2016-10-28 NOTE — ED Triage Notes (Signed)
Pt with UTI symptoms since Saturday to [facility] clinic today for follow-up. BP 80/50, HR 110 at [facility] and was brought here.

## 2016-10-28 NOTE — ED Notes (Signed)
Denyse Amass, CNA 2 rounding on patients at this time. Will continue to monitor. Pt denies any needs at this time.

## 2016-10-28 NOTE — ED Notes (Signed)
Pt placed on bedpan at this time. Dr. Posey Pronto attempted to see patient at this time pt however requested that Dr. Posey Pronto come back when she was done with the bedpan. Pt states she feels better after receiving morphine and the pain in her back is relieved.

## 2016-10-28 NOTE — Progress Notes (Signed)
PHARMACIST - PHYSICIAN COMMUNICATION  CONCERNING:  Enoxaparin (Lovenox) for DVT Prophylaxis    RECOMMENDATION: Patient was prescribed enoxaprin 40mg  q24 hours for VTE prophylaxis.   Filed Weights   10/28/16 0909  Weight: 165 lb (74.8 kg)    Body mass index is 26.63 kg/m.  Estimated Creatinine Clearance: 27.3 mL/min (by C-G formula based on SCr of 1.7 mg/dL (H)).   Based on [facility] patient is candidate for enoxaparin 30mg  every 24 hours based on CrCl <21ml/min   DESCRIPTION: Pharmacy has adjusted enoxaparin dose per [facility] policy.  Patient is now receiving enoxaparin 30mg  every 24 hours.  Nancy Fetter, PharmD Clinical Pharmacist  10/28/2016 4:26 PM

## 2016-10-28 NOTE — H&P (Signed)
[name]: Barbara Mooney    MR#:  [id number]  DATE OF BIRTH:  08-18-1934  DATE OF ADMISSION:  10/28/2016  PRIMARY CARE PHYSICIAN: Singh,Jasmine, MD   REQUESTING/REFERRING PHYSICIAN: Dr Corky Downs  CHIEF COMPLAINT:  Generalized weakness fever with chills  HISTORY OF PRESENT ILLNESS:  [place]  is a 82 y.o. female with a known history ofDiabetes, hypertension, GERD, multiple thyroid nodules comes to the emergency room accompanied with her daughter complaining of generalized weakness, fatigue and fever with chills. She also had symptoms of dysuria. Patient recently finished a course of antibiotic a few weeks ago for her UTI. This is her second episode of UTI for this year. She was found to be tachycardic with low-grade fever and elevated white count of 14,000 and urine which woke significantly abnormal suggestive of sepsis with UTI. Patient is being admitted for further evaluation management. In the emergency room received 1 g of IV Rocephin. Patient was hypotensive initially with blood pressure systolic in the 0000000. She received a liter bolus. She became tachycardic with heart rate in the 130s and I ordered IV metoprolol 5 mg times one to slow her heart rate. Her blood pressure currently is stable.  PAST MEDICAL HISTORY:   Past Medical History:  Diagnosis Date  . Arthritis   . Bruises easily   . Diabetes mellitus   . GERD (gastroesophageal reflux disease)   . Hypertension   . Multiple thyroid nodules     PAST SURGICAL HISTOIRY:  History reviewed. No pertinent surgical history.  SOCIAL HISTORY:   Social History  Substance Use Topics  . Smoking status: Never Smoker  . Smokeless tobacco: Not on file  . Alcohol use No    FAMILY HISTORY:   Family History  Problem Relation Age of Onset  . Breast cancer Sister 24  . Ovarian cancer Maternal Aunt     DRUG ALLERGIES:  No Known Allergies  REVIEW OF SYSTEMS:   Review of Systems  Constitutional: Positive for chills and fever. Negative for weight loss.  HENT: Negative for ear discharge, ear pain and nosebleeds.   Eyes: Negative for blurred vision, pain and discharge.  Respiratory: Negative for sputum production, shortness of breath, wheezing and stridor.   Cardiovascular: Negative for chest pain, palpitations, orthopnea and PND.  Gastrointestinal: Negative for abdominal pain, diarrhea, nausea and vomiting.  Genitourinary: Positive for dysuria and urgency. Negative for frequency.  Musculoskeletal: Positive for back pain. Negative for joint pain.  Neurological: Positive for weakness. Negative for sensory change, speech change and focal weakness.  Psychiatric/Behavioral: Negative for depression and hallucinations. The patient is not nervous/anxious.      MEDICATIONS AT HOME:   Prior to Admission medications   Medication Sig Start Date End Date Taking? Authorizing Provider  diltiazem (DILACOR XR) 240 MG 24 hr capsule Take 240 mg by mouth daily.   Yes Historical Provider, MD  hydrochlorothiazide 25 MG tablet Take 25 mg by mouth daily.     Yes Historical Provider, MD  losartan (COZAAR) 100 MG tablet Take 100 mg by mouth daily.   Yes Historical Provider, MD  metFORMIN (GLUMETZA) 500 MG (MOD) 24 hr tablet Take 500 mg by mouth 2 (two) times daily with a meal.    Yes Historical Provider, MD  metoprolol succinate (TOPROL-XL) 25 MG 24 hr tablet Take 25 mg by mouth daily.   Yes Historical Provider, MD  omeprazole (PRILOSEC) 20 MG capsule Take 20 mg by mouth daily.  Yes Historical Provider, MD      VITAL SIGNS:  Blood pressure 127/70, pulse (!) 136, temperature 99.7 F (37.6 C), temperature source Oral, resp. rate 19, height 5\' 6"  (1.676 m), weight 74.8 kg (165 lb), SpO2 91 %.  PHYSICAL EXAMINATION:  GENERAL:  82 y.o.-year-old patient lying in the bed with no acute distress.  EYES: Pupils equal, round, reactive to light and accommodation. No scleral  icterus. Extraocular muscles intact.  HEENT: Head atraumatic, normocephalic. Oropharynx and nasopharynx clear.  NECK:  Supple, no jugular venous distention. No thyroid enlargement, no tenderness.  LUNGS: Normal breath sounds bilaterally, no wheezing, rales,rhonchi or crepitation. No use of accessory muscles of respiration.  CARDIOVASCULAR: S1, S2 normal. No murmurs, rubs, or gallops. tachycardia ABDOMEN: Soft, nontender, nondistended. Bowel sounds present. No organomegaly or mass.  EXTREMITIES: No pedal edema, cyanosis, or clubbing.  NEUROLOGIC: Cranial nerves II through XII are intact. Muscle strength 5/5 in all extremities. Sensation intact. Gait not checked.  PSYCHIATRIC: The patient is alert and oriented x 3.  SKIN: No obvious rash, lesion, or ulcer.   LABORATORY PANEL:   CBC  Recent Labs Lab 10/28/16 0951  WBC 14.3*  HGB 11.9*  HCT 35.9  PLT 146*   ------------------------------------------------------------------------------------------------------------------  Chemistries   Recent Labs Lab 10/28/16 0951  NA 135  K 2.7*  CL 97*  CO2 26  GLUCOSE 137*  BUN 30*  CREATININE 1.70*  CALCIUM 8.5*  AST 18  ALT 14  ALKPHOS 71  BILITOT 0.8   ------------------------------------------------------------------------------------------------------------------  Cardiac Enzymes  Recent Labs Lab 10/28/16 0951  TROPONINI 0.05*   ------------------------------------------------------------------------------------------------------------------  RADIOLOGY:  Dg Chest Port 1 View  Result Date: 10/28/2016 CLINICAL DATA:  Urinary tract infection. EXAM: PORTABLE CHEST 1 VIEW COMPARISON:  CT chest 02/11/2010 FINDINGS: Cardiac enlargement. Negative for heart failure. Lungs are clear without infiltrate effusion or mass. No acute skeletal abnormality. IMPRESSION: No active disease. Electronically Signed   By: Franchot Gallo M.D.   On: 10/28/2016 10:09    EKG:   Sinus tachycardia   IMPRESSION AND PLAN:   Barbara Mooney  is a 82 y.o. female with a known history ofDiabetes, hypertension, GERD, multiple thyroid nodules comes to the emergency room accompanied with her daughter complaining of generalized weakness, fatigue and fever with chills. She also had symptoms of dysuria. Patient recently finished a course of antibiotic a few weeks ago for her UTI. This is her second episode of UTI for this year. She was found to be tachycardic with low-grade fever and elevated white count of 14,000 and urine which woke significantly abnormal suggestive of sepsis with UTI.  1. Sepsis due to UTI -Patient presented with hypotension and fever tachycardia elevated white count of 14,000 and abnormal urine. -Admitted to telemetry -IV Rocephin every 24 -Follow blood culture and urine culture -IV fluids  2. Sinus tachycardia due to #1 -Since blood pressure is stable give patient 1 dose of IV metoprolol -We'll resume metoprolol oral and Cardizem if blood pressure allows  3. Leukocytosis -Due to sepsis   4. Acute renal failure -Baseline creatinine 0.9 patient presents with 1.70 -IV fluids and monitor I's and O's avoid nephrotoxins  -appears ATN due to sepsis  -5. DVT prophylaxis subcutaneous Lovenox    - above was discussed with patient and patient's daughter present in the emergency room    All the records are reviewed and case discussed with ED provider. Management plans discussed with the patient, family and they are in agreement.  CODE STATUSDO NOT RESUSCITATE  this was discussed with patient  TOTAL TIME TAKING CARE OF THIS PATIEN 18minutes.    Caoilainn Sacks M.D on 10/28/2016 at 3:01 PM  Between 7am to 6pm - Pager - [phone]  After 6pm go to [email or web address] - password [email or web address] [facility]  Office  [phone]  CC: Primary care physician; Glendon Axe, MD

## 2016-10-28 NOTE — ED Notes (Signed)
Pt continues to complain of pain to R forearm under, no redness, or swelling noted at this time. This RN loosened patient bracelet at this time.

## 2016-10-28 NOTE — Progress Notes (Signed)
[place] NOTE  Pharmacy Consult for Potassium Management  Indication: Hypokalemia    No Known Allergies  Patient Measurements: Height: 5\' 6"  (167.6 cm) Weight: 165 lb (74.8 kg) IBW/kg (Calculated) : 59.3  Vital Signs: Temp: 99.7 F (37.6 C) (11/16 0908) Temp Source: Oral (11/16 0908) BP: 153/93 (11/16 1230) Pulse Rate: 90 (11/16 1130)  Recent Labs  10/28/16 0951  WBC 14.3*  HGB 11.9*  HCT 35.9  PLT 146*  CREATININE 1.70*  ALBUMIN 3.1*  PROT 6.4*  AST 18  ALT 14  ALKPHOS 71  BILITOT 0.8   Estimated Creatinine Clearance: 27.3 mL/min (by C-G formula based on SCr of 1.7 mg/dL (H)).  No results for input(s): GLUCAP in the last 72 hours.  Microbiology: No results found for this or any previous visit (from the past 720 hour(s)).   Assessment: Lab Results  Component Value Date   K 2.7 (LL) 10/28/2016   GOAL: K+: 3.5-5 mmol/L  Plan:  Will order KCL 12mEq IV x1 and KCL 37mEq PO x1.  Will redraw K+ level 1 hour after infusion is complete and continue to replace potassium as needed.  Pernell Dupre, PharmD Clinical Pharmacy  10/28/2016,1:23 PM

## 2016-10-28 NOTE — Plan of Care (Signed)
Problem: Education: Goal: Knowledge of [facility] Education information/materials will improve Outcome: Progressing Materials given  Problem: Safety: Goal: Ability to remain free from injury will improve Outcome: Progressing No injury thus far  Problem: Health Behavior/Discharge Planning: Goal: Ability to manage health-related needs will improve Outcome: Progressing Improving  Problem: Pain Managment: Goal: General experience of comfort will improve Outcome: Progressing Improved comfort  Problem: Physical Regulation: Goal: Ability to maintain clinical measurements within normal limits will improve Outcome: Progressing Levels are working their way to normal Goal: Will remain free from infection Outcome: Progressing Patient is being treated for UTI  Problem: Activity: Goal: Risk for activity intolerance will decrease Outcome: Progressing Patient is resting.   Problem: Bowel/Gastric: Goal: Will not experience complications related to bowel motility Outcome: Not Progressing Patient has not had a BM   Comments: Patient is slowly improving.

## 2016-10-28 NOTE — ED Notes (Signed)
Pt states pain has resolved in her arm. Denies any burning sensation at this time.

## 2016-10-28 NOTE — ED Notes (Signed)
Rocephin stopped at this time.

## 2016-10-28 NOTE — ED Notes (Signed)
Pt finished 243mL bolus, started 19mL continuous. Pt c/o pain to R forearm. No swelling or redness noted at this time, pt states pain is aching pain, denies any burning at the IV site.

## 2016-10-28 NOTE — Progress Notes (Signed)
[place] NOTE  Pharmacy Consult for Potassium Management  Indication: Hypokalemia    No Known Allergies  Patient Measurements: Height: 5\' 6"  (167.6 cm) Weight: 175 lb 1.6 oz (79.4 kg) IBW/kg (Calculated) : 59.3  Vital Signs: Temp: 98.2 F (36.8 C) (11/16 2001) Temp Source: Oral (11/16 2001) BP: 119/59 (11/16 2001) Pulse Rate: 105 (11/16 2001)  Recent Labs  10/28/16 0951  WBC 14.3*  HGB 11.9*  HCT 35.9  PLT 146*  CREATININE 1.70*  ALBUMIN 3.1*  PROT 6.4*  AST 18  ALT 14  ALKPHOS 71  BILITOT 0.8   Estimated Creatinine Clearance: 28 mL/min (by C-G formula based on SCr of 1.7 mg/dL (H)).  No results for input(s): GLUCAP in the last 72 hours.  Microbiology: Recent Results (from the past 720 hour(s))  MRSA PCR Screening     Status: None   Collection Time: 10/28/16  6:19 PM  Result Value Ref Range Status   MRSA by PCR NEGATIVE NEGATIVE Final    Comment:        The GeneXpert MRSA Assay (FDA approved for NASAL specimens only), is one component of a comprehensive MRSA colonization surveillance program. It is not intended to diagnose MRSA infection nor to guide or monitor treatment for MRSA infections.      Assessment: Lab Results  Component Value Date   K 4.0 10/28/2016   GOAL: K+: 3.5-5 mmol/L  Plan:  11/16 Will order KCL 31mEq IV x1 and KCL 79mEq PO x1.  Will redraw K+ level 1 hour after infusion is complete and continue to replace potassium as needed.  1116 1844: K+ 4.0   Patient does not need any additional supplementation at this time. Patient has NaCl w/78mEq running at 168ml/hr. Will re-time patients home PO KCL 72mEq for 1000 tomorrow and will recheck K+ in AM. May need to recommend D/C of fluids.   Pernell Dupre, PharmD Clinical Pharmacy  10/28/2016,8:03 PM

## 2016-10-28 NOTE — ED Provider Notes (Signed)
[facility] Emergency Department Provider Note   ____________________________________________    I have reviewed the triage vital signs and the nursing notes.   HISTORY  Chief Complaint Urinary Tract Infection     HPI Barbara Mooney is a 82 y.o. female who presents with chills, weakness, nausea and possible urinary tract infection. Per patient's daughter she has been treated for possible kidney infection over the last several weeks by PCP. She had been doing somewhat better but recently she developed nausea and complaints of feeling fatigued and weak. She had a urinalysis recent yesterday at the [facility] today she went to see her doctor who referred her to the emergency department because of elevated heart rate, fever and low blood pressure. Patient denies abdominal pain. She denies dysuria. She denies cough or shortness of breath. No chest pain. No rashes.   Past Medical History:  Diagnosis Date  . Arthritis   . Bruises easily   . Diabetes mellitus   . GERD (gastroesophageal reflux disease)   . Hypertension   . Multiple thyroid nodules     Patient Active Problem List   Diagnosis Date Noted  . Multinodular goiter (nontoxic) 09/07/2011    History reviewed. No pertinent surgical history.  Prior to Admission medications   Medication Sig Start Date End Date Taking? Authorizing Provider  hydrochlorothiazide 25 MG tablet Take 25 mg by mouth daily.      Historical Provider, MD  metFORMIN (GLUMETZA) 500 MG (MOD) 24 hr tablet Take 500 mg by mouth daily with breakfast.      Historical Provider, MD  omeprazole (PRILOSEC) 20 MG capsule Take 20 mg by mouth daily.      Historical Provider, MD  potassium chloride (KLOR-CON) 20 MEQ packet Take 20 mEq by mouth 2 (two) times daily.      Historical Provider, MD  terazosin (HYTRIN) 2 MG capsule Take 2 mg by mouth at bedtime.      Historical Provider, MD  valsartan (DIOVAN) 320 MG tablet Take 320 mg by  mouth daily.      Historical Provider, MD     Allergies Patient has no known allergies.  Family History  Problem Relation Age of Onset  . Breast cancer Sister 32  . Ovarian cancer Maternal Aunt     Social History Social History  Substance Use Topics  . Smoking status: Never Smoker  . Smokeless tobacco: Not on file  . Alcohol use No    Review of Systems  Constitutional:Positive chills Eyes: No visual changes.  ENT: No sore throat. Cardiovascular: Denies chest pain. Respiratory: Denies shortness of breath. No cough Gastrointestinal: No abdominal pain.   Genitourinary: Negative for dysuria. Musculoskeletal: Lower back is sore bilaterally Skin: Negative for rash. Neurological: Negative for headaches or weakness  10-point ROS otherwise negative.  ____________________________________________   PHYSICAL EXAM:  VITAL SIGNS: ED Triage Vitals  Enc Vitals Group     BP 10/28/16 0908 (!) 90/48     Pulse Rate 10/28/16 0908 (!) 102     Resp --      Temp 10/28/16 0908 99.7 F (37.6 C)     Temp Source 10/28/16 0908 Oral     SpO2 10/28/16 0908 97 %     Weight 10/28/16 0909 165 lb (74.8 kg)     Height 10/28/16 0909 5\' 6"  (1.676 m)     Head Circumference --      Peak Flow --      Pain Score --  Pain Loc --      Pain Edu? --      Excl. in [place]? --     Constitutional: Alert and oriented. No acute distressBut ill appearing Eyes: Conjunctivae are normal.   Nose: No congestion/rhinnorhea. Mouth/Throat: Mucous membranes are Dry Neck:  Painless ROM, No meningismus Cardiovascular: Tachycardia, regular rhythm. Grossly normal heart sounds.  Good peripheral circulation. Respiratory: Normal respiratory effort.  No retractions. Lungs CTAB. Gastrointestinal: Soft and nontender. No distention.  No CVA tenderness. Genitourinary: deferred Musculoskeletal: No lower extremity tenderness nor edema.  Warm and well perfused Neurologic:  Normal speech and language. No gross focal  neurologic deficits are appreciated.  Skin:  Skin is warm, dry and intact. No rash noted. Psychiatric: Mood and affect are normal. Speech and behavior are normal.  ____________________________________________   LABS (all labs ordered are listed, but only abnormal results are displayed)  Labs Reviewed  CULTURE, BLOOD (ROUTINE X 2)  CULTURE, BLOOD (ROUTINE X 2)  URINE CULTURE  LACTIC ACID, PLASMA  LACTIC ACID, PLASMA  COMPREHENSIVE METABOLIC PANEL  CBC WITH DIFFERENTIAL/PLATELET  TROPONIN I  URINALYSIS COMPLETEWITH MICROSCOPIC ([facility] ONLY)  INFLUENZA PANEL BY PCR (TYPE A & B, H1N1)   ____________________________________________  EKG  ED ECG REPORT I, Lavonia Drafts, the attending physician, personally viewed and interpreted this ECG.  Date: 10/28/2016   Rhythm: normal sinus rhythm QRS Axis: normal Intervals: normal ST/T Wave abnormalities: normal Conduction Disturbances: none   ____________________________________________  RADIOLOGY  Chest x-ray unremarkable ____________________________________________   PROCEDURES  Procedure(s) performed: No    Critical Care performed: No ____________________________________________   INITIAL IMPRESSION / ASSESSMENT AND PLAN / ED COURSE  Pertinent labs & imaging results that were available during my care of the patient were reviewed by me and considered in my medical decision making (see chart for details).  Patient presents with tachycardia, borderline blood pressure, possible urinary tract infection and elevated temperature. I'm concerned about sepsis. Code sepsis activated. We will treat with Rocephin at this time, fluid started.  Clinical Course   Patient is given fluid, Rocephin. She is having some increased tachycardia and shivering.  Urinalysis from yesterday consistent with UTI. Still waiting on repeat ED urinalysis. ____________________________________________   FINAL CLINICAL IMPRESSION(S) / ED  DIAGNOSES  Final diagnoses:  Sepsis, due to unspecified organism [facility])      NEW MEDICATIONS STARTED DURING THIS VISIT:  New Prescriptions   No medications on file     Note:  This document was prepared using Dragon voice recognition software and may include unintentional dictation errors.    Lavonia Drafts, MD 10/28/16 1249

## 2016-10-29 ENCOUNTER — Encounter: Payer: Self-pay | Admitting: Emergency Medicine

## 2016-10-29 LAB — BLOOD CULTURE ID PANEL (REFLEXED)
ACINETOBACTER BAUMANNII: NOT DETECTED
CANDIDA ALBICANS: NOT DETECTED
CANDIDA GLABRATA: NOT DETECTED
CANDIDA PARAPSILOSIS: NOT DETECTED
CANDIDA TROPICALIS: NOT DETECTED
Candida krusei: NOT DETECTED
Carbapenem resistance: NOT DETECTED
Enterobacter cloacae complex: NOT DETECTED
Enterobacteriaceae species: DETECTED — AB
Enterococcus species: NOT DETECTED
Escherichia coli: DETECTED — AB
HAEMOPHILUS INFLUENZAE: NOT DETECTED
KLEBSIELLA PNEUMONIAE: NOT DETECTED
Klebsiella oxytoca: NOT DETECTED
Listeria monocytogenes: NOT DETECTED
NEISSERIA MENINGITIDIS: NOT DETECTED
PROTEUS SPECIES: NOT DETECTED
Pseudomonas aeruginosa: NOT DETECTED
SERRATIA MARCESCENS: NOT DETECTED
STAPHYLOCOCCUS SPECIES: NOT DETECTED
STREPTOCOCCUS AGALACTIAE: NOT DETECTED
STREPTOCOCCUS SPECIES: NOT DETECTED
Staphylococcus aureus (BCID): NOT DETECTED
Streptococcus pneumoniae: NOT DETECTED
Streptococcus pyogenes: NOT DETECTED

## 2016-10-29 LAB — BASIC METABOLIC PANEL
ANION GAP: 8 (ref 5–15)
BUN: 26 mg/dL — AB (ref 6–20)
CHLORIDE: 108 mmol/L (ref 101–111)
CO2: 21 mmol/L — ABNORMAL LOW (ref 22–32)
Calcium: 7.7 mg/dL — ABNORMAL LOW (ref 8.9–10.3)
Creatinine, Ser: 1.33 mg/dL — ABNORMAL HIGH (ref 0.44–1.00)
GFR calc Af Amer: 43 mL/min — ABNORMAL LOW (ref >60)
GFR, EST NON AFRICAN AMERICAN: 37 mL/min — AB (ref >60)
Glucose, Bld: 110 mg/dL — ABNORMAL HIGH (ref 65–99)
POTASSIUM: 3.9 mmol/L (ref 3.5–5.1)
SODIUM: 137 mmol/L (ref 135–145)

## 2016-10-29 LAB — CBC
HEMATOCRIT: 30.3 % — AB (ref 35.0–47.0)
HEMOGLOBIN: 10.2 g/dL — AB (ref 12.0–16.0)
MCH: 30.3 pg (ref 26.0–34.0)
MCHC: 33.6 g/dL (ref 32.0–36.0)
MCV: 90.2 fL (ref 80.0–100.0)
Platelets: 107 10*3/uL — ABNORMAL LOW (ref 150–440)
RBC: 3.36 MIL/uL — ABNORMAL LOW (ref 3.80–5.20)
RDW: 15.8 % — AB (ref 11.5–14.5)
WBC: 9.7 10*3/uL (ref 3.6–11.0)

## 2016-10-29 MED ORDER — SODIUM CHLORIDE 0.9 % IV SOLN
1.0000 g | Freq: Two times a day (BID) | INTRAVENOUS | Status: DC
  Administered 2016-10-29 – 2016-10-30 (×3): 1 g via INTRAVENOUS
  Filled 2016-10-29 (×4): qty 1

## 2016-10-29 MED ORDER — CEFTRIAXONE SODIUM-DEXTROSE 2-2.22 GM-% IV SOLR
2.0000 g | INTRAVENOUS | Status: DC
  Filled 2016-10-29: qty 50

## 2016-10-29 MED ORDER — ENSURE ENLIVE PO LIQD
237.0000 mL | Freq: Two times a day (BID) | ORAL | Status: DC
  Administered 2016-10-29 – 2016-11-02 (×10): 237 mL via ORAL

## 2016-10-29 MED ORDER — POTASSIUM CHLORIDE 20 MEQ PO PACK
20.0000 meq | PACK | Freq: Two times a day (BID) | ORAL | Status: DC
  Administered 2016-10-29: 20 meq via ORAL
  Filled 2016-10-29 (×2): qty 1

## 2016-10-29 MED ORDER — ENOXAPARIN SODIUM 40 MG/0.4ML ~~LOC~~ SOLN
40.0000 mg | SUBCUTANEOUS | Status: DC
  Administered 2016-10-29: 40 mg via SUBCUTANEOUS
  Filled 2016-10-29: qty 0.4

## 2016-10-29 MED ORDER — METOPROLOL SUCCINATE ER 25 MG PO TB24
25.0000 mg | ORAL_TABLET | Freq: Every day | ORAL | Status: DC
  Administered 2016-10-29 – 2016-10-30 (×2): 25 mg via ORAL
  Filled 2016-10-29 (×3): qty 1

## 2016-10-29 MED ORDER — PHENOL 1.4 % MT LIQD
1.0000 | OROMUCOSAL | Status: DC | PRN
  Filled 2016-10-29: qty 177

## 2016-10-29 NOTE — Progress Notes (Signed)
PHARMACIST - PHYSICIAN COMMUNICATION  CONCERNING:  Enoxaparin (Lovenox) for DVT Prophylaxis    RECOMMENDATION: Patient was prescribed enoxaprin 40mg  q24 hours for VTE prophylaxis. Dose adjusted to 30mg  Q24H for CrCl < 49ml/min  Filed Weights   10/28/16 0909 10/28/16 1620  Weight: 165 lb (74.8 kg) 175 lb 1.6 oz (79.4 kg)    Body mass index is 28.26 kg/m.  Estimated Creatinine Clearance: 35.8 mL/min (by C-G formula based on SCr of 1.33 mg/dL (H)).   Renal function improved, will resume original dosing of enoxaparin 40mg  SQ Q24H  DESCRIPTION: Pharmacy has adjusted enoxaparin dose per [facility] policy.  Patient is now receiving enoxaparin 40mg  every 24 hours.  Rexene Edison, PharmD Clinical Pharmacist   10/29/2016 12:12 PM

## 2016-10-29 NOTE — Care Management Important Message (Signed)
Important Message  Patient Details  Name: Barbara Mooney MRN: [id number] Date of Birth: 04-Feb-1934   Medicare Important Message Given:  Yes    Katrina Stack, RN 10/29/2016, 10:05 AM

## 2016-10-29 NOTE — Care Management (Signed)
Physical therapy recommending home physical therapy. Will also  benefit from home health nurse.  No agency preference.  Heads up referral to [facility] for PT and SN

## 2016-10-29 NOTE — Progress Notes (Signed)
Dr. Tressia Miners notified that there are two orders for continuous fluids - instructed to stop the NS40K and only run the order for NS. Also informed MD that patient has poor appetite but wants to try ensure. Ok to order per MD.

## 2016-10-29 NOTE — Progress Notes (Signed)
PHARMACY - PHYSICIAN COMMUNICATION CRITICAL VALUE ALERT - BLOOD CULTURE IDENTIFICATION (BCID)  Results for orders placed or performed during the hospital encounter of 10/28/16  Blood Culture ID Panel (Reflexed) (Collected: 10/28/2016  9:52 AM)  Result Value Ref Range   Enterococcus species NOT DETECTED NOT DETECTED   Listeria monocytogenes NOT DETECTED NOT DETECTED   Staphylococcus species NOT DETECTED NOT DETECTED   Staphylococcus aureus NOT DETECTED NOT DETECTED   Streptococcus species NOT DETECTED NOT DETECTED   Streptococcus agalactiae NOT DETECTED NOT DETECTED   Streptococcus pneumoniae NOT DETECTED NOT DETECTED   Streptococcus pyogenes NOT DETECTED NOT DETECTED   Acinetobacter baumannii NOT DETECTED NOT DETECTED   Enterobacteriaceae species DETECTED (A) NOT DETECTED   Enterobacter cloacae complex NOT DETECTED NOT DETECTED   Escherichia coli DETECTED (A) NOT DETECTED   Klebsiella oxytoca NOT DETECTED NOT DETECTED   Klebsiella pneumoniae NOT DETECTED NOT DETECTED   Proteus species NOT DETECTED NOT DETECTED   Serratia marcescens NOT DETECTED NOT DETECTED   Carbapenem resistance NOT DETECTED NOT DETECTED   Haemophilus influenzae NOT DETECTED NOT DETECTED   Neisseria meningitidis NOT DETECTED NOT DETECTED   Pseudomonas aeruginosa NOT DETECTED NOT DETECTED   Candida albicans NOT DETECTED NOT DETECTED   Candida glabrata NOT DETECTED NOT DETECTED   Candida krusei NOT DETECTED NOT DETECTED   Candida parapsilosis NOT DETECTED NOT DETECTED   Candida tropicalis NOT DETECTED NOT DETECTED    Name of physician (or Provider) Contacted: Diamond  Changes to prescribed antibiotics required: Ceftriaxone changed to 2 grams q 24 hours.  Shenita Trego S 10/29/2016  3:24 AM

## 2016-10-29 NOTE — Evaluation (Signed)
Physical Therapy Evaluation Patient Details Name: Barbara Mooney MRN: [id number] DOB: Sep 15, 1934 Today's Date: 10/29/2016   History of Present Illness  Pt is a 82 y.o. female with a known history ofDiabetes, hypertension, GERD, multiple thyroid nodules comes to the emergency room accompanied with her daughter complaining of generalized weakness, fatigue and fever with chills. She also had symptoms of dysuria. Patient recently finished a course of antibiotic a few weeks ago for her UTI. This is her second episode of UTI for this year. She was found to be tachycardic with low-grade fever and elevated white count of 14,000 and urine which woke significantly abnormal suggestive of sepsis with UTI. Patient is being admitted for further evaluation management.  Clinical Impression  Pt presents with deficits in strength, transfers, mobility, gait, balance, and activity tolerance.  Pt required CGA with sit to/from stand transfer and min A to prevent LOB with amb 2' at edge of chair without AD.  Pt limited in amb secondary to weakness/fatigue/lethargy.  RW brought to pt's room for improved safety during future functional transfers with staff.  Pt will benefit from PT services to address above deficits for decreased caregiver assistance upon discharge.      Follow Up Recommendations Home health PT    Equipment Recommendations  Rolling walker with 5" wheels    Recommendations for Other Services       Precautions / Restrictions Precautions Precautions: Fall Restrictions Weight Bearing Restrictions: No      Mobility  Bed Mobility               General bed mobility comments: Not assessed this session secondary to pt had just gotten into recliner with assist from nursing and was lethargic/fatigued.  Transfers Overall transfer level: Needs assistance Equipment used: None Transfers: Sit to/from Stand Sit to Stand: Min guard            Ambulation/Gait Ambulation/Gait assistance:  Min assist Ambulation Distance (Feet): 2 Feet Assistive device: None Gait Pattern/deviations: Step-to pattern     General Gait Details: Pt fatigued with 1-2 small steps at edge of chair without AD with min-mod instability and min A to prevent LOB  Stairs Stairs:  (deferred)          Wheelchair Mobility    Modified Rankin (Stroke Patients Only)       Balance Overall balance assessment: Needs assistance         Standing balance support: No upper extremity supported Standing balance-Leahy Scale: Poor                               Pertinent Vitals/Pain Pain Assessment: No/denies pain    Home Living Family/patient expects to be discharged to:: Private residence Living Arrangements: Spouse/significant other Available Help at Discharge: Family Type of Home: House Home Access: Stairs to enter Entrance Stairs-Rails: Right;Left;Can reach both Technical brewer of Steps: 5 Home Layout: One level Home Equipment: None      Prior Function Level of Independence: Independent         Comments: Ind amb community distances without AD, Ind with all ADLs     Hand Dominance        Extremity/Trunk Assessment   Upper Extremity Assessment: Overall WFL for tasks assessed           Lower Extremity Assessment: Generalized weakness         Communication   Communication: No difficulties  Cognition Arousal/Alertness: Lethargic Behavior During Therapy:  WFL for tasks assessed/performed Overall Cognitive Status: Within Functional Limits for tasks assessed                      General Comments      Exercises Total Joint Exercises Ankle Circles/Pumps: AROM;Both;10 reps Quad Sets: AROM;Both;10 reps Gluteal Sets: AROM;Both;10 reps Towel Squeeze: AROM;Both;10 reps Long Arc Quad: Strengthening;Both;10 reps Knee Flexion: Strengthening;Both;10 reps Other Exercises Other Exercises: Seated hip flex x 10 each to RLE and LLE   Assessment/Plan     PT Assessment Patient needs continued PT services  PT Problem List Decreased strength;Decreased activity tolerance;Decreased balance          PT Treatment Interventions DME instruction;Gait training;Stair training;Functional mobility training;Neuromuscular re-education;Balance training;Therapeutic exercise;Therapeutic activities;Patient/family education    PT Goals (Current goals can be found in the Care Plan section)  Acute Rehab PT Goals Patient Stated Goal: "To get stronger, I'm so week" PT Goal Formulation: With patient Time For Goal Achievement: 11/11/16 Potential to Achieve Goals: Good    Frequency Min 2X/week   Barriers to discharge        Co-evaluation               End of Session Equipment Utilized During Treatment: Gait belt Activity Tolerance: Patient limited by fatigue Patient left: in chair;with family/visitor present;with call bell/phone within reach Nurse Communication: Other (comment) (Need for extended call bell to reach pt in chair)         Time: [id number] PT Time Calculation (min) (ACUTE ONLY): 30 min   Charges:   PT Evaluation $PT Eval Low Complexity: 1 Procedure PT Treatments $Therapeutic Exercise: 8-22 mins   PT G Codes:        DRoyetta Asal PT, DPT 10/29/16, 1:30 PM

## 2016-10-29 NOTE — Progress Notes (Signed)
[place] [facility] [place] NAME: Barbara Mooney    MR#:  [id number]  DATE OF BIRTH:  10/29/1934  SUBJECTIVE:  CHIEF COMPLAINT:   Chief Complaint  Patient presents with  . Urinary Tract Infection   -Admitted with sepsis criteria and noted to have recurrent UTI. -Urine cultures are pending. Currently on meropenem. -Complains of decreased appetite and also noted to have increased heart rate.  REVIEW OF SYSTEMS:  Review of Systems  Constitutional: Positive for malaise/fatigue. Negative for chills and fever.  HENT: Negative for ear discharge, ear pain, hearing loss and nosebleeds.   Eyes: Negative for blurred vision and double vision.  Respiratory: Negative for cough, hemoptysis, shortness of breath and wheezing.   Cardiovascular: Negative for chest pain, palpitations, orthopnea and leg swelling.  Gastrointestinal: Positive for constipation and nausea. Negative for abdominal pain, diarrhea, heartburn, melena and vomiting.  Genitourinary: Negative for dysuria and urgency.  Musculoskeletal: Positive for myalgias. Negative for back pain and neck pain.  Skin: Negative for rash.  Neurological: Negative for dizziness, sensory change, speech change, focal weakness and headaches.  Endo/Heme/Allergies: Does not bruise/bleed easily.  Psychiatric/Behavioral: Negative for depression.    DRUG ALLERGIES:  No Known Allergies  VITALS:  Blood pressure (!) 163/77, pulse (!) 106, temperature 98.4 F (36.9 C), temperature source Oral, resp. rate 18, height 5\' 6"  (1.676 m), weight 79.4 kg (175 lb 1.6 oz), SpO2 99 %.  PHYSICAL EXAMINATION:  Physical Exam  GENERAL:  82 y.o.-year-old patient lying in the bed with no acute distress.  EYES: Pupils equal, round, reactive to light and accommodation. No scleral icterus. Extraocular muscles intact.  HEENT: Head atraumatic, normocephalic. Oropharynx and nasopharynx clear.  NECK:  Supple, no jugular venous distention.  No thyroid enlargement, no tenderness.  LUNGS: Normal breath sounds bilaterally, no wheezing, rales,rhonchi or crepitation. No use of accessory muscles of respiration.  CARDIOVASCULAR: S1, S2 normal. No  rubs, or gallops. 2/6 systolic murmur present, rapid heart rate ABDOMEN: Soft, nontender, nondistended. Bowel sounds present. No organomegaly or mass.  EXTREMITIES: No pedal edema, cyanosis, or clubbing.  NEUROLOGIC: Cranial nerves II through XII are intact. Muscle strength 5/5 in all extremities. Sensation intact. Gait not checked.  PSYCHIATRIC: The patient is alert and oriented x 3.  SKIN: No obvious rash, lesion, or ulcer.    LABORATORY PANEL:   CBC  Recent Labs Lab 10/29/16 0412  WBC 9.7  HGB 10.2*  HCT 30.3*  PLT 107*   ------------------------------------------------------------------------------------------------------------------  Chemistries   Recent Labs Lab 10/28/16 0951  10/29/16 0412  NA 135  --  137  K 2.7*  < > 3.9  CL 97*  --  108  CO2 26  --  21*  GLUCOSE 137*  --  110*  BUN 30*  --  26*  CREATININE 1.70*  --  1.33*  CALCIUM 8.5*  --  7.7*  AST 18  --   --   ALT 14  --   --   ALKPHOS 71  --   --   BILITOT 0.8  --   --   < > = values in this interval not displayed. ------------------------------------------------------------------------------------------------------------------  Cardiac Enzymes  Recent Labs Lab 10/28/16 0951  TROPONINI 0.05*   ------------------------------------------------------------------------------------------------------------------  RADIOLOGY:  Dg Chest Port 1 View  Result Date: 10/28/2016 CLINICAL DATA:  Urinary tract infection. EXAM: PORTABLE CHEST 1 VIEW COMPARISON:  CT chest 02/11/2010 FINDINGS: Cardiac enlargement. Negative for heart failure. Lungs are clear without infiltrate effusion or  mass. No acute skeletal abnormality. IMPRESSION: No active disease. Electronically Signed   By: Franchot Gallo M.D.   On:  10/28/2016 10:09    EKG:  No orders found for this or any previous visit.  ASSESSMENT AND PLAN:   81 year old female with past medical history significant for hypertension, diabetes, GERD who recently was treated with antibiotic for a UTI comes back to the hospital secondary to sepsis.  #1 sepsis-secondary to acute cystitis. -Blood cultures and urine cultures are pending. WBC is improving. -Continue meropenem. -Sepsis is improving  #2 sinus tachycardia-continue oral Cardizem and oral metoprolol has been added. Likely secondary to underlying infection and sepsis.  #3 acute renal failure-secondary to ATN from sepsis. -Continue IV fluids. Improving creatinine.  #4 decreased appetite-likely from infection. Encourage oral intake. -Ensure with meals  #5 Hypertension-blood pressure low normal from sepsis. Cardizem and oral metoprolol restarted. Losartan and hydrochlorothiazide are still being held  #6 DVT prophylaxis-on Lovenox   Physical Therapy consulted. Daughter updated at bedside    All the records are reviewed and case discussed with Care Management/Social Workerr. Management plans discussed with the patient, family and they are in agreement.  CODE STATUS: Full code  TOTAL TIME TAKING CARE OF THIS PATIENT: 38 minutes.   POSSIBLE D/C IN 1-2 DAYS, DEPENDING ON CLINICAL CONDITION.   Gladstone Lighter M.D on 10/29/2016 at 1:11 PM  Between 7am to 6pm - Pager - [phone]  After 6pm go to [email or web address] - password [email or web address]  Office  [phone]  CC: Primary care physician; Glendon Axe, MD

## 2016-10-29 NOTE — Progress Notes (Signed)
Pharmacy Antibiotic Note  Barbara Mooney is a 82 y.o. female admitted on 10/28/2016 with UTI.  Pharmacy has been consulted for meropenem dosing.  Plan: Admitted for UTI, now with e. Coli bacteremia and AKI  Meropenem 1gm IV Q12H appropriate for current renal function will continue to follow and adjust as indicated.   Height: 5\' 6"  (167.6 cm) Weight: 175 lb 1.6 oz (79.4 kg) IBW/kg (Calculated) : 59.3  Temp (24hrs), Avg:98.3 F (36.8 C), Min:98.2 F (36.8 C), Max:98.4 F (36.9 C)   Recent Labs Lab 10/28/16 0951 10/29/16 0412  WBC 14.3* 9.7  CREATININE 1.70* 1.33*  LATICACIDVEN 1.6  --     Estimated Creatinine Clearance: 35.8 mL/min (by C-G formula based on SCr of 1.33 mg/dL (H)).    No Known Allergies  Antimicrobials this admission: Ceftriaxon x 1 11/16 Meropenem 11/17>>  Dose adjustments this admission:   Microbiology results: 11/16 Blood: GRN x 2 BCID: E.coli 11/16 Urine  Thank you for allowing pharmacy to be a part of this patient's care.  Mykaila Blunck C 10/29/2016 3:49 PM

## 2016-10-29 NOTE — Care Management (Signed)
Patient admitted from home with sepsis due to uti.  She was treated as an outpatient with antibiotics one week prior.  She also required one dose of IV metoprolol for tachycardia.  Independent in all adls, denies issues accessing medical care, obtaining medications or with transportation.  Current with her PCP.  PT consult is pending. Patient daughter [name] is providing support for the patient

## 2016-10-30 DIAGNOSIS — I480 Paroxysmal atrial fibrillation: Secondary | ICD-10-CM

## 2016-10-30 DIAGNOSIS — I10 Essential (primary) hypertension: Secondary | ICD-10-CM

## 2016-10-30 LAB — CBC
HCT: 26.7 % — ABNORMAL LOW (ref 35.0–47.0)
HEMOGLOBIN: 9.1 g/dL — AB (ref 12.0–16.0)
MCH: 31.4 pg (ref 26.0–34.0)
MCHC: 34.2 g/dL (ref 32.0–36.0)
MCV: 91.7 fL (ref 80.0–100.0)
PLATELETS: 113 10*3/uL — AB (ref 150–440)
RBC: 2.91 MIL/uL — ABNORMAL LOW (ref 3.80–5.20)
RDW: 16.3 % — ABNORMAL HIGH (ref 11.5–14.5)
WBC: 6.3 10*3/uL (ref 3.6–11.0)

## 2016-10-30 LAB — URINE CULTURE: Culture: 10000 — AB

## 2016-10-30 LAB — BASIC METABOLIC PANEL
Anion gap: 4 — ABNORMAL LOW (ref 5–15)
BUN: 24 mg/dL — AB (ref 6–20)
CHLORIDE: 113 mmol/L — AB (ref 101–111)
CO2: 21 mmol/L — ABNORMAL LOW (ref 22–32)
CREATININE: 1.01 mg/dL — AB (ref 0.44–1.00)
Calcium: 7.5 mg/dL — ABNORMAL LOW (ref 8.9–10.3)
GFR calc Af Amer: 59 mL/min — ABNORMAL LOW (ref >60)
GFR calc non Af Amer: 51 mL/min — ABNORMAL LOW (ref >60)
GLUCOSE: 137 mg/dL — AB (ref 65–99)
Potassium: 3.9 mmol/L (ref 3.5–5.1)
SODIUM: 138 mmol/L (ref 135–145)

## 2016-10-30 LAB — GLUCOSE, CAPILLARY: Glucose-Capillary: 203 mg/dL — ABNORMAL HIGH (ref 65–99)

## 2016-10-30 MED ORDER — ENSURE ENLIVE PO LIQD: 237.0000 mL | Freq: Two times a day (BID) | ORAL | 12 refills | Status: DC

## 2016-10-30 MED ORDER — POTASSIUM CHLORIDE CRYS ER 20 MEQ PO TBCR: 20.0000 meq | EXTENDED_RELEASE_TABLET | Freq: Two times a day (BID) | ORAL | Status: DC

## 2016-10-30 MED ORDER — LOSARTAN POTASSIUM 50 MG PO TABS
100.0000 mg | ORAL_TABLET | Freq: Every day | ORAL | Status: DC
Start: 2016-10-30 — End: 2016-10-31
  Administered 2016-10-30: 100 mg via ORAL
  Filled 2016-10-30: qty 2

## 2016-10-30 MED ORDER — ONDANSETRON HCL 4 MG/2ML IJ SOLN
4.0000 mg | INTRAMUSCULAR | Status: DC | PRN
  Administered 2016-10-30 – 2016-11-01 (×5): 4 mg via INTRAVENOUS
  Filled 2016-10-30 (×5): qty 2

## 2016-10-30 MED ORDER — HYDROCORTISONE 2.5 % RE CREA
TOPICAL_CREAM | Freq: Two times a day (BID) | RECTAL | Status: DC
  Administered 2016-10-30 – 2016-11-02 (×7): via RECTAL
  Filled 2016-10-30: qty 28.35

## 2016-10-30 MED ORDER — APIXABAN 5 MG PO TABS
5.0000 mg | ORAL_TABLET | Freq: Two times a day (BID) | ORAL | Status: DC
  Administered 2016-10-30 – 2016-11-02 (×7): 5 mg via ORAL
  Filled 2016-10-30 (×7): qty 1

## 2016-10-30 MED ORDER — CEFUROXIME AXETIL 250 MG PO TABS: 250.0000 mg | ORAL_TABLET | Freq: Two times a day (BID) | ORAL | 0 refills | Status: DC

## 2016-10-30 MED ORDER — DILTIAZEM HCL 25 MG/5ML IV SOLN
10.0000 mg | Freq: Once | INTRAVENOUS | Status: AC
  Administered 2016-10-30: 10 mg via INTRAVENOUS
  Filled 2016-10-30: qty 5

## 2016-10-30 MED ORDER — POTASSIUM CHLORIDE CRYS ER 20 MEQ PO TBCR
20.0000 meq | EXTENDED_RELEASE_TABLET | Freq: Once | ORAL | Status: AC
  Administered 2016-10-30: 20 meq via ORAL
  Filled 2016-10-30: qty 1

## 2016-10-30 MED ORDER — MEROPENEM-SODIUM CHLORIDE 1 GM/50ML IV SOLR
1.0000 g | Freq: Two times a day (BID) | INTRAVENOUS | Status: DC
  Filled 2016-10-30: qty 50

## 2016-10-30 MED ORDER — CEFTRIAXONE SODIUM-DEXTROSE 2-2.22 GM-% IV SOLR
2.0000 g | INTRAVENOUS | Status: DC
  Administered 2016-10-30 – 2016-11-01 (×3): 2 g via INTRAVENOUS
  Filled 2016-10-30 (×5): qty 50

## 2016-10-30 MED ORDER — METOPROLOL SUCCINATE ER 50 MG PO TB24
50.0000 mg | ORAL_TABLET | Freq: Two times a day (BID) | ORAL | Status: DC
  Administered 2016-10-30 – 2016-10-31 (×2): 50 mg via ORAL
  Filled 2016-10-30 (×2): qty 1

## 2016-10-30 NOTE — Progress Notes (Signed)
ANTICOAGULATION CONSULT NOTE - Initial Consult  Pharmacy Consult for apixaban dosing Indication: non valvular atrial fibrillation  No Known Allergies  Patient Measurements: Height: 5\' 6"  (167.6 cm) Weight: 175 lb 1.6 oz (79.4 kg) IBW/kg (Calculated) : 59.3  Vital Signs: Temp: 98.2 F (36.8 C) (11/18 1038) Temp Source: Oral (11/18 1038) BP: 159/82 (11/18 1038) Pulse Rate: 94 (11/18 1038)  Labs:  Recent Labs  10/28/16 0951 10/29/16 0412 10/30/16 0441  HGB 11.9* 10.2* 9.1*  HCT 35.9 30.3* 26.7*  PLT 146* 107* 113*  CREATININE 1.70* 1.33* 1.01*  TROPONINI 0.05*  --   --     Estimated Creatinine Clearance: 47.2 mL/min (by C-G formula based on SCr of 1.01 mg/dL (H)).   Medical History: Past Medical History:  Diagnosis Date  . Arthritis   . Bruises easily   . Diabetes mellitus   . GERD (gastroesophageal reflux disease)   . Hypertension   . Multiple thyroid nodules     Medications:  Scheduled:  . apixaban  5 mg Oral BID  . cefTRIAXone  2 g Intravenous Q24H  . diltiazem  240 mg Oral Daily  . feeding supplement (ENSURE ENLIVE)  237 mL Oral BID BM  . hydrocortisone   Rectal BID  . losartan  100 mg Oral Daily  . metoprolol succinate  50 mg Oral BID  . pantoprazole  40 mg Oral Daily  . terazosin  2 mg Oral QHS    Assessment: Pharmacy has been consulted to dose apixaban in this 82 yoF admitted with sepsis 2/2 to UTI. She has been newly diagnosed with afib with RVR. It does not appear as if patient was on anticoagulants PTA. Pt is 82 years old, 79.4 kg, and has a SCr of 1.01  Goal of Therapy:  Monitor platelets by anticoagulation protocol: Yes   Plan:  Discontinue enoxaparin 40 mg QD for DVT prophylaxis and initiate apixaban 5 mg bid  Darrow Bussing, PharmD Pharmacy Resident 10/30/2016 3:03 PM

## 2016-10-30 NOTE — Progress Notes (Signed)
Patients HR in 150's sustaining. MD notified. Cardiology consult and IV cardizem push ordered. Will give cardizem and continue to monitor.

## 2016-10-30 NOTE — Progress Notes (Signed)
[facility] [place] [facility] [place] NAME: Barbara Mooney    MR#:  [id number]  DATE OF BIRTH:  [birth year]  SUBJECTIVE:  CHIEF COMPLAINT:   Chief Complaint  Patient presents with  . Urinary Tract Infection   -urine cultures with E.coli -feels much better, legs aching - appetite slowly improving -Has chronic nausea in the mornings  REVIEW OF SYSTEMS:  Review of Systems  Constitutional: Positive for malaise/fatigue. Negative for chills and fever.  HENT: Negative for ear discharge, ear pain, hearing loss and nosebleeds.   Eyes: Negative for blurred vision and double vision.  Respiratory: Negative for cough, hemoptysis, shortness of breath and wheezing.   Cardiovascular: Negative for chest pain, palpitations, orthopnea and leg swelling.  Gastrointestinal: Positive for nausea. Negative for abdominal pain, constipation, diarrhea, heartburn, melena and vomiting.  Genitourinary: Negative for dysuria and urgency.  Musculoskeletal: Positive for myalgias. Negative for back pain and neck pain.  Skin: Negative for rash.  Neurological: Negative for dizziness, sensory change, speech change, focal weakness and headaches.  Endo/Heme/Allergies: Does not bruise/bleed easily.  Psychiatric/Behavioral: Negative for depression.    DRUG ALLERGIES:  No Known Allergies  VITALS:  Blood pressure (!) 159/82, pulse 94, temperature 98.2 F (36.8 C), temperature source Oral, resp. rate 18, height 5\' 6"  (1.676 m), weight 79.4 kg (175 lb 1.6 oz), SpO2 100 %.  PHYSICAL EXAMINATION:  Physical Exam  GENERAL:  82 y.o.-year-old patient lying in the bed with no acute distress.  EYES: Pupils equal, round, reactive to light and accommodation. No scleral icterus. Extraocular muscles intact.  HEENT: Head atraumatic, normocephalic. Oropharynx and nasopharynx clear.  NECK:  Supple, no jugular venous distention. No thyroid enlargement, no tenderness.  LUNGS: Normal breath sounds  bilaterally, no wheezing, rales,rhonchi or crepitation. No use of accessory muscles of respiration.  CARDIOVASCULAR: S1, S2 normal. No  rubs, or gallops. 2/6 systolic murmur present, rapid heart rate ABDOMEN: Soft, nontender, nondistended. Bowel sounds present. No organomegaly or mass.  EXTREMITIES: No  cyanosis, or clubbing. 1+ pedal edema noted NEUROLOGIC: Cranial nerves II through XII are intact. Muscle strength 5/5 in all extremities. Sensation intact. Gait not checked.  PSYCHIATRIC: The patient is alert and oriented x 3.  SKIN: No obvious rash, lesion, or ulcer.    LABORATORY PANEL:   CBC  Recent Labs Lab 10/30/16 0441  WBC 6.3  HGB 9.1*  HCT 26.7*  PLT 113*   ------------------------------------------------------------------------------------------------------------------  Chemistries   Recent Labs Lab 10/28/16 0951  10/30/16 0441  NA 135  < > 138  K 2.7*  < > 3.9  CL 97*  < > 113*  CO2 26  < > 21*  GLUCOSE 137*  < > 137*  BUN 30*  < > 24*  CREATININE 1.70*  < > 1.01*  CALCIUM 8.5*  < > 7.5*  AST 18  --   --   ALT 14  --   --   ALKPHOS 71  --   --   BILITOT 0.8  --   --   < > = values in this interval not displayed. ------------------------------------------------------------------------------------------------------------------  Cardiac Enzymes  Recent Labs Lab 10/28/16 0951  TROPONINI 0.05*   ------------------------------------------------------------------------------------------------------------------  RADIOLOGY:  No results found.  EKG:  No orders found for this or any previous visit.  ASSESSMENT AND PLAN:   82 year old female with past medical history significant for hypertension, diabetes, GERD who recently was treated with antibiotic for a UTI comes back to the hospital secondary to sepsis.  #  1 sepsis-secondary to acute cystitis. -Blood cultures Are negative and urine cultures are growing Escherichia coli. WBC is improving. -Change  meropenem to Rocephin -Sepsis is improving  #2 sinus tachycardia-continue oral Cardizem and oral metoprolol has been added.  -secondary to underlying infection and sepsis.  If continues to be elevated, will get ECHO and cardiology consult  #3 acute renal failure-secondary to ATN from sepsis. -Discontinue IV fluids. Improved creatinine.  #4 decreased appetite-likely from infection. Encourage oral intake. -Ensure with meals. Improving  #5 Hypertension-blood pressure improving. On Cardizem and oral metoprolol. Losartan being started today, continue to hold hydrochlorothiazide  #6 DVT prophylaxis-on Lovenox   Physical Therapy consulted. Recommended home health. Daughter updated at bedside If patient continues to feel better, we'll discharge today, if not tomorrow morning    All the records are reviewed and case discussed with Care Management/Social Workerr. Management plans discussed with the patient, family and they are in agreement.  CODE STATUS: Full code  TOTAL TIME TAKING CARE OF THIS PATIENT: 38 minutes.   POSSIBLE D/C TODAY OR TOMORROW, DEPENDING ON CLINICAL CONDITION.   Gladstone Lighter M.D on 10/30/2016 at 11:12 AM  Between 7am to 6pm - Pager - [phone]  After 6pm go to [email or web address] - password [facility]  Office  [phone]  CC: Primary care physician; Glendon Axe, MD

## 2016-10-30 NOTE — Progress Notes (Signed)
Pharmacy Antibiotic Note  Barbara Mooney is a 82 y.o. female admitted on 10/28/2016 with UTI now with Ecoli bacteremia and UTI, UCx sensitive to ceftriaxone, negative for ESBL.  Pharmacy has been consulted for ceftriaxone dosing.  Plan: Pt was receiving meropenem 1g IV q 12 hours. With UCx sensitives back, will transition patient to ceftriaxone 2 g IV q 24 hours for Ecoli bacteremia and UTI.    Height: 5\' 6"  (167.6 cm) Weight: 175 lb 1.6 oz (79.4 kg) IBW/kg (Calculated) : 59.3  Temp (24hrs), Avg:98.4 F (36.9 C), Min:98.2 F (36.8 C), Max:98.5 F (36.9 C)   Recent Labs Lab 10/28/16 0951 10/29/16 0412 10/30/16 0441  WBC 14.3* 9.7 6.3  CREATININE 1.70* 1.33* 1.01*  LATICACIDVEN 1.6  --   --     Estimated Creatinine Clearance: 47.2 mL/min (by C-G formula based on SCr of 1.01 mg/dL (H)).    No Known Allergies  Antimicrobials this admission: Ceftriaxone x 1 11/16 Meropenem 11/17 >> 11/18 Ceftriaxone 2 g 11/18 >>  Dose adjustments this admission:  Microbiology results: 11/16 Blood: pending E.coli 11/16 Urine: Final Ecoli  Thank you for allowing pharmacy to be a part of this patient's care.  Darrow Bussing, PharmD Pharmacy Resident 10/30/2016 10:43 AM

## 2016-10-30 NOTE — Consult Note (Signed)
Patient ID: Barbara Mooney MRN: [id number] DOB/AGE: 1934/11/16 82 y.o.  Admit date: 10/28/2016 Primary Physician Glendon Axe, MD Primary Cardiologist new Requesting physician: Dr. Gladstone Lighter   Chief Complaint  tachycardia  HPI: Barbara Mooney is an 81F with hypertension, diabetes and GERD who presented to the ED 10/28/16 with generalized weakness, fatigue, fever and chills.  She was found to have sepsis secondary to a urinary source and was treated with IV fluid and IV ceftriaxone.  She was hypotensive and reportedly had sinus tachycardia with heart rates in the 130s and received IV metoprolol. Barbara Mooney feels generally fatigued and had chills, but denies palpitations, shortness of breath, lightheadedness, dizziness, orthopnea, or PND. She does endorse chronic lower extremity edema that she attributes to venous insufficiency. She has never been told that she had atrial fibrillation in the past. Cardiology was consulted for persistent tachycardia.   Review of Systems: A 12 point review of systems was obtained and was negative with exceptions noted in the HPI.   Past Medical History:  Diagnosis Date  . Arthritis   . Bruises easily   . Diabetes mellitus   . GERD (gastroesophageal reflux disease)   . Hypertension   . Multiple thyroid nodules     Medications Prior to Admission  Medication Sig Dispense Refill  . diltiazem (DILACOR XR) 240 MG 24 hr capsule Take 240 mg by mouth daily.    . hydrochlorothiazide 25 MG tablet Take 25 mg by mouth daily.      Marland Kitchen losartan (COZAAR) 100 MG tablet Take 100 mg by mouth daily.    . metFORMIN (GLUMETZA) 500 MG (MOD) 24 hr tablet Take 500 mg by mouth 2 (two) times daily with a meal.     . metoprolol succinate (TOPROL-XL) 25 MG 24 hr tablet Take 25 mg by mouth daily.    Marland Kitchen omeprazole (PRILOSEC) 20 MG capsule Take 20 mg by mouth daily.         . cefTRIAXone  2 g Intravenous Q24H  . diltiazem  240 mg Oral Daily  . enoxaparin (LOVENOX)  injection  40 mg Subcutaneous Q24H  . feeding supplement (ENSURE ENLIVE)  237 mL Oral BID BM  . hydrocortisone   Rectal BID  . losartan  100 mg Oral Daily  . metoprolol succinate  25 mg Oral Daily  . pantoprazole  40 mg Oral Daily  . terazosin  2 mg Oral QHS    Infusions:   No Known Allergies  Social History   Social History  . Marital status: Married    Spouse name: N/A  . Number of children: N/A  . Years of education: N/A   Occupational History  . Not on file.   Social History Main Topics  . Smoking status: Never Smoker  . Smokeless tobacco: Not on file  . Alcohol use No  . Drug use: No  . Sexual activity: Not on file   Other Topics Concern  . Not on file   Social History Narrative  . No narrative on file    Family History  Problem Relation Age of Onset  . Breast cancer Sister 61  . Ovarian cancer Maternal Aunt     PHYSICAL EXAM: Vitals:   10/30/16 1037 10/30/16 1038  BP: (!) 159/82 (!) 159/82  Pulse: (!) 106 94  Resp:    Temp:  98.2 F (36.8 C)     Intake/Output Summary (Last 24 hours) at 10/30/16 1217 Last data filed at 10/30/16 0959  Gross per 24  hour  Intake             1000 ml  Output              700 ml  Net              300 ml    General:  Well appearing. No respiratory difficulty HEENT: normal Neck: supple. JVP 3 cm above the clavicle at 45. Carotids 2+ bilat; no bruits. No lymphadenopathy or thryomegaly appreciated. Cor: PMI nondisplaced. Regular rate & rhythm. No rubs, gallops or murmurs. Lungs: clear Abdomen: soft, nontender, nondistended. No hepatosplenomegaly. No bruits or masses. Good bowel sounds. Extremities: no cyanosis, clubbing, rash, 1+ pitting edema Neuro: alert & oriented x 3, cranial nerves grossly intact. moves all 4 extremities w/o difficulty. Affect pleasant.  Results for orders placed or performed during the hospital encounter of 10/28/16 (from the past 24 hour(s))  CBC     Status: Abnormal   Collection Time:  10/30/16  4:41 AM  Result Value Ref Range   WBC 6.3 3.6 - 11.0 K/uL   RBC 2.91 (L) 3.80 - 5.20 MIL/uL   Hemoglobin 9.1 (L) 12.0 - 16.0 g/dL   HCT 26.7 (L) 35.0 - 47.0 %   MCV 91.7 80.0 - 100.0 fL   MCH 31.4 26.0 - 34.0 pg   MCHC 34.2 32.0 - 36.0 g/dL   RDW 16.3 (H) 11.5 - 14.5 %   Platelets 113 (L) 150 - 440 K/uL  Basic metabolic panel     Status: Abnormal   Collection Time: 10/30/16  4:41 AM  Result Value Ref Range   Sodium 138 135 - 145 mmol/L   Potassium 3.9 3.5 - 5.1 mmol/L   Chloride 113 (H) 101 - 111 mmol/L   CO2 21 (L) 22 - 32 mmol/L   Glucose, Bld 137 (H) 65 - 99 mg/dL   BUN 24 (H) 6 - 20 mg/dL   Creatinine, Ser 1.01 (H) 0.44 - 1.00 mg/dL   Calcium 7.5 (L) 8.9 - 10.3 mg/dL   GFR calc non Af Amer 51 (L) >60 mL/min   GFR calc Af Amer 59 (L) >60 mL/min   Anion gap 4 (L) 5 - 15   No results found.    ECG: n/a  Telemetry: Sinus rhythm with periods of atrial fibrillation with heart rates into the 140s. Occasional PVCs.   Echo pending  ASSESSMENT/PLAN:  # Paroxysmal atrial fibrillation: Barbara Mooney has episodes of atrial fibrillation with rapid ventricular response. She is completely asymptomatic. Given this fact it is unclear how long she has had atrial fibrillation. We will work on rate control. She can continue on diltiazem and we will increase metoprolol to 50 mg twice daily. We will also start Eliquis 5mg  bid.  Echocardiogram is pending we will check her thyroid function. She does appear to be mildly volume overloaded. However, given that she required IV fluids for hypotension we will not diurese her at this time. This patients CHA2DS2-VASc Score and unadjusted Ischemic Stroke Rate (% per year) is equal to 7.2 % stroke rate/year from a score of 5  Above score calculated as 1 point each if present [CHF, HTN, DM, Vascular=MI/PAD/Aortic Plaque, Age if 65-74, or Female] Above score calculated as 2 points each if present [Age > 75, or Stroke/TIA/TE]  # Hypertension:  Blood  pressure is above goal. Her home hydrochlorothiazide and lisinopril were held due to acute renal dysfunction on admission in the setting of hypotension. We will increase metoprolol as above.  Continue diltiazem.   Signed: Yousef Huge C. Oval Linsey, MD, [facility]  10/30/2016, 12:17 PM

## 2016-10-31 ENCOUNTER — Inpatient Hospital Stay (HOSPITAL_COMMUNITY)
Admit: 2016-10-31 | Discharge: 2016-10-31 | Disposition: A | Discharge status: Home / Self Care | Payer: Medicare Other | Attending: Internal Medicine | Admitting: Internal Medicine

## 2016-10-31 DIAGNOSIS — I4891 Unspecified atrial fibrillation: Secondary | ICD-10-CM

## 2016-10-31 LAB — BASIC METABOLIC PANEL
ANION GAP: 5 (ref 5–15)
BUN: 18 mg/dL (ref 6–20)
CALCIUM: 8.4 mg/dL — AB (ref 8.9–10.3)
CO2: 24 mmol/L (ref 22–32)
Chloride: 107 mmol/L (ref 101–111)
Creatinine, Ser: 0.88 mg/dL (ref 0.44–1.00)
GFR calc Af Amer: >60 mL/min (ref >60)
GFR calc non Af Amer: >60 mL/min (ref >60)
GLUCOSE: 140 mg/dL — AB (ref 65–99)
POTASSIUM: 4.3 mmol/L (ref 3.5–5.1)
Sodium: 136 mmol/L (ref 135–145)

## 2016-10-31 LAB — ECHOCARDIOGRAM COMPLETE
HEIGHTINCHES: 66 in
Weight: 2801.6 oz

## 2016-10-31 MED ORDER — METOPROLOL SUCCINATE ER 50 MG PO TB24: 50.0000 mg | ORAL_TABLET | Freq: Two times a day (BID) | ORAL | 2 refills | Status: DC

## 2016-10-31 MED ORDER — ONDANSETRON 4 MG PO TBDP: 4.0000 mg | ORAL_TABLET | Freq: Three times a day (TID) | ORAL | 0 refills | Status: DC | PRN

## 2016-10-31 MED ORDER — DILTIAZEM HCL ER 60 MG PO CP12
120.0000 mg | ORAL_CAPSULE | Freq: Once | ORAL | Status: AC
  Administered 2016-10-31: 120 mg via ORAL
  Filled 2016-10-31: qty 2

## 2016-10-31 MED ORDER — METOPROLOL TARTRATE 5 MG/5ML IV SOLN
5.0000 mg | Freq: Once | INTRAVENOUS | Status: AC
  Administered 2016-10-31: 5 mg via INTRAVENOUS
  Filled 2016-10-31: qty 5

## 2016-10-31 MED ORDER — METOPROLOL SUCCINATE ER 100 MG PO TB24
100.0000 mg | ORAL_TABLET | Freq: Two times a day (BID) | ORAL | Status: DC
  Administered 2016-10-31 – 2016-11-02 (×4): 100 mg via ORAL
  Filled 2016-10-31 (×4): qty 1

## 2016-10-31 MED ORDER — DILTIAZEM HCL ER COATED BEADS 180 MG PO CP24
360.0000 mg | ORAL_CAPSULE | Freq: Every day | ORAL | Status: DC
  Administered 2016-11-01 – 2016-11-02 (×2): 360 mg via ORAL
  Filled 2016-10-31 (×2): qty 2

## 2016-10-31 MED ORDER — APIXABAN 5 MG PO TABS: 5.0000 mg | ORAL_TABLET | Freq: Two times a day (BID) | ORAL | 2 refills | Status: DC

## 2016-10-31 MED ORDER — FUROSEMIDE 10 MG/ML IJ SOLN
20.0000 mg | Freq: Once | INTRAMUSCULAR | Status: AC
  Administered 2016-10-31: 20 mg via INTRAVENOUS
  Filled 2016-10-31: qty 2

## 2016-10-31 NOTE — Progress Notes (Signed)
HR elevated as high as 170's intermittently Further increased metoprolol, 1 dose IV stat Will monitor today as well. F/u cardiology recommendations

## 2016-10-31 NOTE — Progress Notes (Signed)
[place] [facility] [place] NAME: Barbara Mooney    MR#:  [id number]  DATE OF BIRTH:  [birth year]  SUBJECTIVE:  CHIEF COMPLAINT:   Chief Complaint  Patient presents with  . Urinary Tract Infection   - feels about the same, HR elevated, remains in afib - appreciate cardiology consult  REVIEW OF SYSTEMS:  Review of Systems  Constitutional: Positive for malaise/fatigue. Negative for chills and fever.  HENT: Negative for ear discharge, ear pain, hearing loss and nosebleeds.   Eyes: Negative for blurred vision and double vision.  Respiratory: Negative for cough, hemoptysis, shortness of breath and wheezing.   Cardiovascular: Negative for chest pain, palpitations, orthopnea and leg swelling.  Gastrointestinal: Positive for nausea. Negative for abdominal pain, constipation, diarrhea, heartburn, melena and vomiting.  Genitourinary: Negative for dysuria and urgency.  Musculoskeletal: Positive for myalgias. Negative for back pain and neck pain.  Skin: Negative for rash.  Neurological: Negative for dizziness, sensory change, speech change, focal weakness and headaches.  Endo/Heme/Allergies: Does not bruise/bleed easily.  Psychiatric/Behavioral: Negative for depression.    DRUG ALLERGIES:  No Known Allergies  VITALS:  Blood pressure (!) 147/97, pulse 93, temperature 98.1 F (36.7 C), temperature source Oral, resp. rate 16, height 5\' 6"  (1.676 m), weight 79.4 kg (175 lb 1.6 oz), SpO2 95 %.  PHYSICAL EXAMINATION:  Physical Exam  GENERAL:  82 y.o.-year-old patient lying in the bed with no acute distress.  EYES: Pupils equal, round, reactive to light and accommodation. No scleral icterus. Extraocular muscles intact.  HEENT: Head atraumatic, normocephalic. Oropharynx and nasopharynx clear.  NECK:  Supple, no jugular venous distention. No thyroid enlargement, no tenderness.  LUNGS: Normal breath sounds bilaterally, no wheezing, rales,rhonchi or  crepitation. No use of accessory muscles of respiration.  CARDIOVASCULAR: S1, S2 normal. No  rubs, or gallops. 2/6 systolic murmur present, rapid heart rate ABDOMEN: Soft, nontender, nondistended. Bowel sounds present. No organomegaly or mass.  EXTREMITIES: No  cyanosis, or clubbing. 1+ pedal edema noted NEUROLOGIC: Cranial nerves II through XII are intact. Muscle strength 5/5 in all extremities. Sensation intact. Gait not checked.  PSYCHIATRIC: The patient is alert and oriented x 3.  SKIN: No obvious rash, lesion, or ulcer.    LABORATORY PANEL:   CBC  Recent Labs Lab 10/30/16 0441  WBC 6.3  HGB 9.1*  HCT 26.7*  PLT 113*   ------------------------------------------------------------------------------------------------------------------  Chemistries   Recent Labs Lab 10/28/16 0951  10/31/16 0521  NA 135  < > 136  K 2.7*  < > 4.3  CL 97*  < > 107  CO2 26  < > 24  GLUCOSE 137*  < > 140*  BUN 30*  < > 18  CREATININE 1.70*  < > 0.88  CALCIUM 8.5*  < > 8.4*  AST 18  --   --   ALT 14  --   --   ALKPHOS 71  --   --   BILITOT 0.8  --   --   < > = values in this interval not displayed. ------------------------------------------------------------------------------------------------------------------  Cardiac Enzymes  Recent Labs Lab 10/28/16 0951  TROPONINI 0.05*   ------------------------------------------------------------------------------------------------------------------  RADIOLOGY:  No results found.  EKG:  No orders found for this or any previous visit.  ASSESSMENT AND PLAN:   82 year old female with past medical history significant for hypertension, diabetes, GERD who recently was treated with antibiotic for a UTI comes back to the hospital secondary to sepsis.  #1 sepsis-secondary to acute  cystitis. -Blood cultures Are negative and urine cultures are growing Escherichia coli. WBC is improving. -Changed meropenem to Rocephin. Discharge on keflex for a  total of 7 days -Sepsis is improving  #2 sinus tachycardia/ atrial fibrillation- persistent.  -continue oral Cardizem and oral metoprolol has been added and dose increased.  -appreciate cardiology consult. ECHO pending  #3 acute renal failure-secondary to ATN from sepsis. -Discontinued IV fluids. Improved creatinine. Small dose of lasix today  #4 decreased appetite-likely from infection. Encourage oral intake. -Ensure with meals. Improving  #5 Hypertension-blood pressure improving. On Cardizem and oral metoprolol.  continue to hold hydrochlorothiazide and losartan  #6 DVT prophylaxis-on Lovenox   Physical Therapy consulted. Recommended home health. Son updated at bedside If HR better and cleared by cardiology, can be discharged today    All the records are reviewed and case discussed with Care Management/Social Workerr. Management plans discussed with the patient, family and they are in agreement.  CODE STATUS: Full code  TOTAL TIME TAKING CARE OF THIS PATIENT: 38 minutes.   POSSIBLE D/C TODAY OR TOMORROW, DEPENDING ON CLINICAL CONDITION.   Gladstone Lighter M.D on 10/31/2016 at 11:21 AM  Between 7am to 6pm - Pager - [phone]  After 6pm go to [email or web address] - password [facility]  Office  [phone]  CC: Primary care physician; Glendon Axe, MD

## 2016-10-31 NOTE — Progress Notes (Signed)
*  PRELIMINARY RESULTS* Echocardiogram 2D Echocardiogram has been performed.  Barbara Mooney 10/31/2016, 9:00 AM

## 2016-10-31 NOTE — Progress Notes (Signed)
Patient Name: Barbara Mooney Date of Encounter: 10/31/2016  Primary Cardiologist: new  Hospital Problem List     Active Problems:   SIRS (systemic inflammatory response syndrome) (HCC)   Paroxysmal atrial fibrillation (HCC)   Essential hypertension     Subjective   Feeling well but tired.  Walked the halls this am and denies chest pain, shortness of breath, palpitations, lightheadedness or dizziness.   Inpatient Medications    Scheduled Meds: . apixaban  5 mg Oral BID  . cefTRIAXone  2 g Intravenous Q24H  . [START ON 11/01/2016] diltiazem  360 mg Oral Daily  . diltiazem  120 mg Oral Once  . feeding supplement (ENSURE ENLIVE)  237 mL Oral BID BM  . hydrocortisone   Rectal BID  . metoprolol  5 mg Intravenous Once  . metoprolol succinate  100 mg Oral BID  . pantoprazole  40 mg Oral Daily  . terazosin  2 mg Oral QHS   Continuous Infusions:  PRN Meds: acetaminophen **OR** acetaminophen, [DISCONTINUED] ondansetron **OR** ondansetron (ZOFRAN) IV, phenol, traMADol   Vital Signs    Vitals:   10/30/16 1956 10/31/16 0455 10/31/16 0919 10/31/16 1126  BP: (!) 134/98 127/89 (!) 147/97 (!) 127/104  Pulse: (!) 125 (!) 101 93 (!) 114  Resp: 18 18 16 19   Temp: 99.5 F (37.5 C) 98.4 F (36.9 C) 98.1 F (36.7 C) 98.2 F (36.8 C)  TempSrc: Oral Oral Oral Oral  SpO2: 100% 100% 95% 98%  Weight:      Height:        Intake/Output Summary (Last 24 hours) at 10/31/16 1203 Last data filed at 10/31/16 1032  Gross per 24 hour  Intake              420 ml  Output              350 ml  Net               70 ml   Filed Weights   10/28/16 0909 10/28/16 1620  Weight: 74.8 kg (165 lb) 79.4 kg (175 lb 1.6 oz)    Physical Exam  General:  Well appearing. No respiratory difficulty HEENT: normal Neck: supple. No JVD. Carotids 2+ bilat; no bruits. No lymphadenopathy or thryomegaly appreciated. Cor: PMI nondisplaced. Irregularly irregular.  Tachycardic.  No rubs, gallops or  murmurs. Lungs: clear Abdomen: soft, nontender, nondistended. No hepatosplenomegaly. No bruits or masses. Good bowel sounds. Extremities: no cyanosis, clubbing, rash, trace edema Neuro: alert & oriented x 3, cranial nerves grossly intact. moves all 4 extremities w/o difficulty. Affect pleasant.  Labs    CBC  Recent Labs  10/29/16 0412 10/30/16 0441  WBC 9.7 6.3  HGB 10.2* 9.1*  HCT 30.3* 26.7*  MCV 90.2 91.7  PLT 107* [id number]*   Basic Metabolic Panel  Recent Labs  10/30/16 0441 10/31/16 0521  NA 138 136  K 3.9 4.3  CL 113* 107  CO2 21* 24  GLUCOSE 137* 140*  BUN 24* 18  CREATININE 1.01* 0.88  CALCIUM 7.5* 8.4*   Liver Function Tests No results for input(s): AST, ALT, ALKPHOS, BILITOT, PROT, ALBUMIN in the last 72 hours. No results for input(s): LIPASE, AMYLASE in the last 72 hours. Cardiac Enzymes No results for input(s): CKTOTAL, CKMB, CKMBINDEX, TROPONINI in the last 72 hours. BNP Invalid input(s): POCBNP D-Dimer No results for input(s): DDIMER in the last 72 hours. Hemoglobin A1C No results for input(s): HGBA1C in the last 72 hours. Fasting Lipid  Panel No results for input(s): CHOL, HDL, LDLCALC, TRIG, CHOLHDL, LDLDIRECT in the last 72 hours. Thyroid Function Tests No results for input(s): TSH, T4TOTAL, T3FREE, THYROIDAB in the last 72 hours.  Invalid input(s): FREET3  Telemetry    Atrial fibrillation.  Rates 110s-150s - Personally Reviewed  ECG    n/a - Personally Reviewed  Radiology    No results found.  Cardiac Studies   Echo 10/31/16: Study Conclusions  - Left ventricle: The cavity size was normal. There was mild   concentric hypertrophy. Systolic function was normal. The   estimated ejection fraction was in the range of 55% to 60%. Wall   motion was normal; there were no regional wall motion   abnormalities. - Aortic valve: Transvalvular velocity was within the normal range.   There was no stenosis. There was no regurgitation. - Mitral  valve: Calcified annulus. Transvalvular velocity was   within the normal range. There was no evidence for stenosis.   There was trivial regurgitation. - Left atrium: The atrium was moderately dilated. - Right ventricle: The cavity size was normal. Wall thickness was   normal. Systolic function was normal. - Atrial septum: No defect or patent foramen ovale was identified   by color flow Doppler. - Tricuspid valve: There was mild regurgitation. - Pulmonary arteries: Systolic pressure was within the normal   range. PA peak pressure: 35 mm Hg (S).  Patient Profile     74F with hypertension, diabetes and GERD who presented to the ED 10/28/16 here with sepsis from a urinary source and newly diagnosed atrial fibrillation with RVR.    Assessment & Plan    # Paroxysmal atrial fibrillation: Barbara Mooney remains in atrial fibrillation.  Rates are poorly-controlled.  We will increase metoprolol to 100mg  bid and give an additional dose of diltiazem 120mg  now.  Tomorrow we will increase it to 360mg  daily.  Echo shows normal systolic function and no significant valvular heart disease.  Continue Eliquis 5mg  bid.  We will check her thyroid function.  This patients CHA2DS2-VASc Score and unadjusted Ischemic Stroke Rate (% per year) is equal to 7.2 % stroke rate/year from a score of 5  Above score calculated as 1 point each if present [CHF, HTN, DM, Vascular=MI/PAD/Aortic Plaque, Age if 65-74, or Female] Above score calculated as 2 points each if present [Age > 75, or Stroke/TIA/TE]  # Hypertension:  Blood pressure is above goal. Increase metoprolol and diltiazem as above.   Signed, Skeet Latch, MD  10/31/2016, 12:03 PM

## 2016-11-01 LAB — CULTURE, BLOOD (ROUTINE X 2)

## 2016-11-01 LAB — BASIC METABOLIC PANEL
ANION GAP: 6 (ref 5–15)
BUN: 19 mg/dL (ref 6–20)
CHLORIDE: 104 mmol/L (ref 101–111)
CO2: 28 mmol/L (ref 22–32)
Calcium: 8.5 mg/dL — ABNORMAL LOW (ref 8.9–10.3)
Creatinine, Ser: 0.91 mg/dL (ref 0.44–1.00)
GFR calc Af Amer: >60 mL/min (ref >60)
GFR, EST NON AFRICAN AMERICAN: 58 mL/min — AB (ref >60)
Glucose, Bld: 146 mg/dL — ABNORMAL HIGH (ref 65–99)
POTASSIUM: 3.9 mmol/L (ref 3.5–5.1)
SODIUM: 138 mmol/L (ref 135–145)

## 2016-11-01 MED ORDER — DIPHENHYDRAMINE HCL 25 MG PO CAPS
25.0000 mg | ORAL_CAPSULE | Freq: Every evening | ORAL | Status: DC | PRN
  Administered 2016-11-01: 25 mg via ORAL
  Filled 2016-11-01: qty 1

## 2016-11-01 NOTE — Progress Notes (Signed)
Patient: Barbara Mooney / Admit Date: 10/28/2016 / Date of Encounter: 11/01/2016, 7:26 AM   Subjective: Nausea overnight. Feels weak. Ambulated in the hallway x 1 on 11/19. Echo with normal LV systolic function. BP improved. Thyroid function pending. No bmet this morning.   Review of Systems: Review of Systems  Constitutional: Positive for malaise/fatigue. Negative for chills, diaphoresis, fever and weight loss.  HENT: Negative for congestion.   Eyes: Negative for discharge and redness.  Respiratory: Negative for cough, hemoptysis, sputum production, shortness of breath and wheezing.   Cardiovascular: Negative for chest pain, palpitations, orthopnea, claudication, leg swelling and PND.  Gastrointestinal: Positive for nausea. Negative for abdominal pain, blood in stool, heartburn, melena and vomiting.  Genitourinary: Negative for hematuria.  Musculoskeletal: Negative for falls and myalgias.  Skin: Negative for rash.  Neurological: Positive for weakness. Negative for dizziness, tingling, tremors, sensory change, speech change, focal weakness and loss of consciousness.  Endo/Heme/Allergies: Does not bruise/bleed easily.  Psychiatric/Behavioral: Negative for substance abuse. The patient is not nervous/anxious.     Objective: Telemetry: Afib, upper 90's to low 100's bpm Physical Exam: Blood pressure 124/68, pulse 96, temperature 98.2 F (36.8 C), temperature source Oral, resp. rate 18, height 5\' 6"  (1.676 m), weight 175 lb 1.6 oz (79.4 kg), SpO2 99 %. Body mass index is 28.26 kg/m. General: Well developed, well nourished, in no acute distress. Head: Normocephalic, atraumatic, sclera non-icteric, no xanthomas, nares are without discharge. Neck: Negative for carotid bruits. JVP not elevated. Lungs: Faint crackles right base. Breathing is unlabored. Heart: Irregularly irregular S1 S2 without murmurs, rubs, or gallops.  Abdomen: Soft, non-tender, non-distended with normoactive bowel  sounds. No rebound/guarding. Extremities: No clubbing or cyanosis. No edema. Distal pedal pulses are 2+ and equal bilaterally. Neuro: Alert and oriented X 3. Moves all extremities spontaneously. Psych:  Responds to questions appropriately with a normal affect.   Intake/Output Summary (Last 24 hours) at 11/01/16 0726 Last data filed at 10/31/16 2348  Gross per 24 hour  Intake              540 ml  Output                0 ml  Net              540 ml    Inpatient Medications:  . apixaban  5 mg Oral BID  . cefTRIAXone  2 g Intravenous Q24H  . diltiazem  360 mg Oral Daily  . feeding supplement (ENSURE ENLIVE)  237 mL Oral BID BM  . hydrocortisone   Rectal BID  . metoprolol succinate  100 mg Oral BID  . pantoprazole  40 mg Oral Daily  . terazosin  2 mg Oral QHS   Infusions:   Labs:  Recent Labs  10/30/16 0441 10/31/16 0521  NA 138 136  K 3.9 4.3  CL 113* 107  CO2 21* 24  GLUCOSE 137* 140*  BUN 24* 18  CREATININE 1.01* 0.88  CALCIUM 7.5* 8.4*   No results for input(s): AST, ALT, ALKPHOS, BILITOT, PROT, ALBUMIN in the last 72 hours.  Recent Labs  10/30/16 0441  WBC 6.3  HGB 9.1*  HCT 26.7*  MCV 91.7  PLT 113*   No results for input(s): CKTOTAL, CKMB, TROPONINI in the last 72 hours. Invalid input(s): POCBNP No results for input(s): HGBA1C in the last 72 hours.   Weights: Filed Weights   10/28/16 0909 10/28/16 1620  Weight: 165 lb (74.8 kg) 175 lb  1.6 oz (79.4 kg)     Radiology/Studies:  Dg Chest Port 1 View  Result Date: 10/28/2016 CLINICAL DATA:  Urinary tract infection. EXAM: PORTABLE CHEST 1 VIEW COMPARISON:  CT chest 02/11/2010 FINDINGS: Cardiac enlargement. Negative for heart failure. Lungs are clear without infiltrate effusion or mass. No acute skeletal abnormality. IMPRESSION: No active disease. Electronically Signed   By: Franchot Gallo M.D.   On: 10/28/2016 10:09     Assessment and Plan  Active Problems:   SIRS (systemic inflammatory response  syndrome) (HCC)   Paroxysmal atrial fibrillation (HCC)   Essential hypertension    1. PAF: -She remains in Afib with heart rates in the upper 90's to low 100's bpm -She is scheduled for increased dose of Cardizem CD to 360 mg this morning -Continue with Lopressor 100 mg bid -Continue Eliquis 5 mg bid -CHADS2VASc 5 (HTN, DM, age x 2, female) -Discussed importance of anticoagulation -If she remains in Afib in outpatient follow up after 3 full weeks of anticoagulation can pursue cardioversion via either DCCV or pharmacological  -Ambulate in the hallway this morning to assess heart rate   2. HTN: -Much improved -Continue medications as above   Signed, Christell Faith, PA-C [facility] Pager: [phone] 11/01/2016, 7:26 AM

## 2016-11-01 NOTE — Progress Notes (Signed)
Physical Therapy Treatment Patient Details Name: Barbara Mooney MRN: [id number] DOB: 25-May-1934 Today's Date: 11/01/2016    History of Present Illness Pt is a 82 y.o. female with a known history ofDiabetes, hypertension, GERD, multiple thyroid nodules comes to the emergency room accompanied with her daughter complaining of generalized weakness, fatigue and fever with chills. She also had symptoms of dysuria. Patient recently finished a course of antibiotic a few weeks ago for her UTI. This is her second episode of UTI for this year. She was found to be tachycardic with low-grade fever and elevated white count of 14,000 and urine which woke significantly abnormal suggestive of sepsis with UTI. Patient is being admitted for further evaluation management.    PT Comments    Pt fatigued, but agreeable to PT; daughter present for session and has several questions regarding stairs and set up at home for bathroom. Education on stair climbing methods and education/suggestions regarding tub/bathroom set up with recommendations for equipment; CM notified. Pt participates in seated and stand exercises. Pt notes extreme weakness as well as several bouts of onset of dizziness requiring seated rest to resolve. Pt does display 1 episode of sudden weakness in Bilateral lower extremities with uncontrolled descent to bed requiring assist. Deferred walking at this time due to symptoms. Pt heart rate monitored via telemetry monitor and between 75 and 86 throughout session. Pt requires increased time/effort for bed mobility and Min guard assist for transfers and stand activities. Continue recommendation as home health PT pending pt's ability to demonstrate ambulation and stair climbing; this was explained to pt and daughter.   Follow Up Recommendations  Home health PT     Equipment Recommendations  Rolling walker with 5" wheels (Bedside commode; tub bench)    Recommendations for Other Services       Precautions  / Restrictions Precautions Precautions: Fall Restrictions Weight Bearing Restrictions: No    Mobility  Bed Mobility Overal bed mobility: Modified Independent             General bed mobility comments: slow with heavy use of rail; moderately effortful  Transfers Overall transfer level: Needs assistance Equipment used: Rolling walker (2 wheeled) Transfers: Sit to/from Stand Sit to Stand: Min guard         General transfer comment: cues for safe hand placement with stand and sit  Ambulation/Gait             General Gait Details: Not tested due to dizziness increasing with stand march   Stairs            Wheelchair Mobility    Modified Rankin (Stroke Patients Only)       Balance Overall balance assessment: Needs assistance Sitting-balance support: Bilateral upper extremity supported Sitting balance-Leahy Scale: Fair                              Cognition Arousal/Alertness: Awake/alert (Fatigued) Behavior During Therapy: WFL for tasks assessed/performed Overall Cognitive Status: Within Functional Limits for tasks assessed                      Exercises General Exercises - Lower Extremity Long Arc Quad: AROM;Both;20 reps;Seated Hip ABduction/ADduction: AROM;Strengthening;Both;Seated;10 reps Straight Leg Raises: Strengthening;Both;10 reps;Standing (uncontrolled sit due to knees buckling during first 10) Hip Flexion/Marching: Strengthening;Both;Standing;10 reps Toe Raises: AROM;Both;20 reps;Seated Heel Raises: AROM;Both;20 reps;Seated Other Exercises Other Exercises: B hip extension 10x R, 5x L    General Comments  Pertinent Vitals/Pain Pain Assessment: No/denies pain    Home Living                      Prior Function            PT Goals (current goals can now be found in the care plan section) Progress towards PT goals: Progressing toward goals    Frequency    Min 2X/week      PT Plan  Current plan remains appropriate    Co-evaluation             End of Session   Activity Tolerance: Patient limited by fatigue Patient left: in bed;with call bell/phone within reach;with bed alarm set;with family/visitor present     Time: [id number] PT Time Calculation (min) (ACUTE ONLY): 43 min  Charges:  $Therapeutic Exercise: 23-37 mins $Therapeutic Activity: 8-22 mins                    G CodesLarae Grooms, PTA 11/01/2016, 3:54 PM

## 2016-11-01 NOTE — Care Management (Signed)
Barrier to discharge: heart rate.  Has required IV metoprolol, IV lasix dose and cardizem within the last 24 hours.  Physical therapy assessed on 11/17 and recommended home health.  Will need reassess today to make sure this is still the appropriate disposition and eval heart rate with activity

## 2016-11-01 NOTE — Care Management (Signed)
Physical therapy has made some recommendations for home equipment.  may benefit from OT at home if physical therapy not able to make recommendations for bathroom safety.  Will provide med Alert information.  Provided Eliquis 30 day coupon.

## 2016-11-01 NOTE — Progress Notes (Signed)
[place] [facility] [place] NAME: Barbara Mooney    MR#:  [id number]  DATE OF BIRTH:  [birth year]  SUBJECTIVE:  CHIEF COMPLAINT:   Chief Complaint  Patient presents with  . Urinary Tract Infection   No SOB or palpitation, HR elevated to [id number] on excertion, remains in afib REVIEW OF SYSTEMS:  Review of Systems  Constitutional: Positive for malaise/fatigue. Negative for chills and fever.  HENT: Negative for ear discharge, ear pain, hearing loss and nosebleeds.   Eyes: Negative for blurred vision and double vision.  Respiratory: Negative for cough, hemoptysis, shortness of breath and wheezing.   Cardiovascular: Negative for chest pain, palpitations, orthopnea and leg swelling.  Gastrointestinal: Negative for abdominal pain, constipation, diarrhea, heartburn, melena, nausea and vomiting.  Genitourinary: Negative for dysuria and urgency.  Musculoskeletal: Negative for back pain, myalgias and neck pain.  Skin: Negative for rash.  Neurological: Negative for dizziness, sensory change, speech change, focal weakness and headaches.  Endo/Heme/Allergies: Does not bruise/bleed easily.  Psychiatric/Behavioral: Negative for depression.    DRUG ALLERGIES:  No Known Allergies  VITALS:  Blood pressure (!) 142/71, pulse 83, temperature 97.9 F (36.6 C), temperature source Oral, resp. rate 18, height 5\' 6"  (1.676 m), weight 175 lb 1.6 oz (79.4 kg), SpO2 97 %.  PHYSICAL EXAMINATION:  Physical Exam  GENERAL:  82 y.o.-year-old patient lying in the bed with no acute distress.  EYES: Pupils equal, round, reactive to light and accommodation. No scleral icterus. Extraocular muscles intact.  HEENT: Head atraumatic, normocephalic. Oropharynx and nasopharynx clear.  NECK:  Supple, no jugular venous distention. No thyroid enlargement, no tenderness.  LUNGS: Normal breath sounds bilaterally, no wheezing, rales,rhonchi or crepitation. No use of accessory muscles of  respiration.  CARDIOVASCULAR: S1, S2 normal. No  rubs, or gallops. 2/6 systolic murmur present, rapid heart rate ABDOMEN: Soft, nontender, nondistended. Bowel sounds present. No organomegaly or mass.  EXTREMITIES: No  cyanosis, or clubbing. 1+ pedal edema noted NEUROLOGIC: Cranial nerves II through XII are intact. Muscle strength 5/5 in all extremities. Sensation intact. Gait not checked.  PSYCHIATRIC: The patient is alert and oriented x 3.  SKIN: No obvious rash, lesion, or ulcer.    LABORATORY PANEL:   CBC  Recent Labs Lab 10/30/16 0441  WBC 6.3  HGB 9.1*  HCT 26.7*  PLT 113*   ------------------------------------------------------------------------------------------------------------------  Chemistries   Recent Labs Lab 10/28/16 0951  11/01/16 0319  NA 135  < > 138  K 2.7*  < > 3.9  CL 97*  < > 104  CO2 26  < > 28  GLUCOSE 137*  < > 146*  BUN 30*  < > 19  CREATININE 1.70*  < > 0.91  CALCIUM 8.5*  < > 8.5*  AST 18  --   --   ALT 14  --   --   ALKPHOS 71  --   --   BILITOT 0.8  --   --   < > = values in this interval not displayed. ------------------------------------------------------------------------------------------------------------------  Cardiac Enzymes  Recent Labs Lab 10/28/16 0951  TROPONINI 0.05*   ------------------------------------------------------------------------------------------------------------------  RADIOLOGY:  No results found.  EKG:   Orders placed or performed during the hospital encounter of 10/28/16  . EKG 12-Lead  . EKG 12-Lead    ASSESSMENT AND PLAN:   82 year old female with past medical history significant for hypertension, diabetes, GERD who recently was treated with antibiotic for a UTI comes back to the hospital secondary to  sepsis.  #1 sepsis-secondary to acute cystitis. -Blood cultures Are negative and urine cultures are growing Escherichia coli. WBC is improving. -Changed meropenem to Rocephin. Discharge on  keflex for a total of 7 days -Sepsis is improving  #2 sinus tachycardia/ atrial fibrillation with RVR- persistent.  increased oral Cardizem and oral metoprolol.  -appreciate cardiology consult. ECHO EF 55-60%.  #3 acute renal failure-secondary to ATN from sepsis. -Discontinued IV fluids. Improved creatinine. She was given small dose of lasix.  #4 decreased appetite-likely from infection. Encourage oral intake. -Ensure with meals. Improving  #5 Hypertension-blood pressure improving. On Cardizem and oral metoprolol.  continue to hold hydrochlorothiazide and losartan  #6 DVT prophylaxis-on Lovenox   Physical Therapy consulted. Recommended home health. Son updated at bedside If HR better and cleared by cardiology, can be discharged tomorrow.  All the records are reviewed and case discussed with Care Management/Social Workerr. Management plans discussed with the patient, her daughter and they are in agreement.  CODE STATUS: Full code  TOTAL TIME TAKING CARE OF THIS PATIENT: 38 minutes.   POSSIBLE D/C TOMORROW, DEPENDING ON CLINICAL CONDITION.   Demetrios Loll M.D on 11/01/2016 at 3:41 PM  Between 7am to 6pm - Pager - [phone]  After 6pm go to [email or web address] - password [facility]  Office  [phone]  CC: Primary care physician; Glendon Axe, MD

## 2016-11-02 ENCOUNTER — Telehealth: Payer: Self-pay

## 2016-11-02 ENCOUNTER — Telehealth: Payer: Self-pay | Admitting: Physician Assistant

## 2016-11-02 DIAGNOSIS — R2681 Unsteadiness on feet: Secondary | ICD-10-CM

## 2016-11-02 DIAGNOSIS — A419 Sepsis, unspecified organism: Secondary | ICD-10-CM

## 2016-11-02 DIAGNOSIS — R6510 Systemic inflammatory response syndrome (SIRS) of non-infectious origin without acute organ dysfunction: Secondary | ICD-10-CM

## 2016-11-02 DIAGNOSIS — D649 Anemia, unspecified: Secondary | ICD-10-CM

## 2016-11-02 LAB — THYROID PANEL WITH TSH
FREE THYROXINE INDEX: 2 (ref 1.2–4.9)
T3 UPTAKE RATIO: 28 % (ref 24–39)
T4, Total: 7.1 ug/dL (ref 4.5–12.0)
TSH: 2.34 u[IU]/mL (ref 0.450–4.500)

## 2016-11-02 MED ORDER — FUROSEMIDE 20 MG PO TABS: 20.0000 mg | ORAL_TABLET | Freq: Every day | ORAL | 5 refills | Status: DC | PRN

## 2016-11-02 MED ORDER — FUROSEMIDE 20 MG PO TABS: 20.0000 mg | ORAL_TABLET | Freq: Every day | ORAL | Status: DC | PRN

## 2016-11-02 MED ORDER — LOPERAMIDE HCL 2 MG PO CAPS
4.0000 mg | ORAL_CAPSULE | Freq: Once | ORAL | Status: AC
  Administered 2016-11-02: 4 mg via ORAL
  Filled 2016-11-02: qty 2

## 2016-11-02 MED ORDER — METOPROLOL SUCCINATE ER 100 MG PO TB24: 100.0000 mg | ORAL_TABLET | Freq: Two times a day (BID) | ORAL | 1 refills | Status: DC

## 2016-11-02 MED ORDER — DILTIAZEM HCL ER COATED BEADS 360 MG PO CP24: 360.0000 mg | ORAL_CAPSULE | Freq: Every day | ORAL | 1 refills | Status: DC

## 2016-11-02 MED ORDER — OMEPRAZOLE 20 MG PO CPDR: 20.0000 mg | DELAYED_RELEASE_CAPSULE | Freq: Every day | ORAL | 6 refills | Status: DC

## 2016-11-02 NOTE — Telephone Encounter (Signed)
-----   Message from Blain Pais sent at 11/02/2016  9:50 AM EST ----- Regarding: tcm/ph 11/29 Dr. Rockey Situ 8:20

## 2016-11-02 NOTE — Care Management Important Message (Signed)
Important Message  Patient Details  Name: Barbara Mooney MRN: [id number] Date of Birth: 1934-11-18   Medicare Important Message Given:  Yes    Katrina Stack, RN 11/02/2016, 9:58 AM

## 2016-11-02 NOTE — Telephone Encounter (Signed)
Paged by answering service. Patient hasn't received rx of Cardizem CD 360mg  and Prilosec. Rx set to [facility] @ [address]

## 2016-11-02 NOTE — Progress Notes (Signed)
Physical Therapy Treatment Patient Details Name: Barbara Mooney MRN: [id number] DOB: 18-Mar-1934 Today's Date: 11/02/2016    History of Present Illness Pt is a 82 y.o. female with a known history ofDiabetes, hypertension, GERD, multiple thyroid nodules comes to the emergency room accompanied with her daughter complaining of generalized weakness, fatigue and fever with chills. She also had symptoms of dysuria. Patient recently finished a course of antibiotic a few weeks ago for her UTI. This is her second episode of UTI for this year. She was found to be tachycardic with low-grade fever and elevated white count of 14,000 and urine which woke significantly abnormal suggestive of sepsis with UTI. Patient is being admitted for further evaluation management.    PT Comments    Pt up in bathroom with daughter initially; requires seated rest afterward, as HR 120 and mildly short of breath. Pt demonstrates good progression with ambulation and stair climbing today without issue other than decreased endurance from baseline. Pt/daughter have several question, and all answered to their satisfaction regarding nutrition supplementation, progressing endurance and infection prevention. Pt to discharge home today and continue with HHPT.  Follow Up Recommendations  Home health PT     Equipment Recommendations  Rolling walker with 5" wheels;Other (comment) (BSC, tub bench)    Recommendations for Other Services       Precautions / Restrictions Precautions Precautions: Fall Restrictions Weight Bearing Restrictions: No    Mobility  Bed Mobility               General bed mobility comments: Not tested; up in bathroom with daughter  Transfers Overall transfer level: Needs assistance Equipment used: Rolling walker (2 wheeled) Transfers: Sit to/from Stand Sit to Stand: Supervision         General transfer comment: cues for hand placement with sitting. No complaints of dizziness. No evidence of  knees buckling  Ambulation/Gait Ambulation/Gait assistance: Min guard;Supervision Ambulation Distance (Feet): 120 Feet Assistive device: Rolling walker (2 wheeled) Gait Pattern/deviations: Step-through pattern Gait velocity: reduced   General Gait Details: Slower than baseline subjectively. No LOB or knee buckling. Heart rate did increase temporarily to 136 post ambulation/steps; returned to mid 90s quickly with seated rest.   Stairs Stairs: Yes Stairs assistance: Min guard Stair Management: One rail Left;Sideways (2 hands on 1 rail) Number of Stairs: 3 General stair comments: Steady, no LOB or knee buckling. Good concentric/eccentric control in LEs  Wheelchair Mobility    Modified Rankin (Stroke Patients Only)       Balance Overall balance assessment: Needs assistance Sitting-balance support: Feet supported Sitting balance-Leahy Scale: Good     Standing balance support: Bilateral upper extremity supported Standing balance-Leahy Scale: Fair                      Cognition Arousal/Alertness: Awake/alert Behavior During Therapy: WFL for tasks assessed/performed Overall Cognitive Status: Within Functional Limits for tasks assessed                      Exercises Other Exercises Other Exercises: Education/discussion regarding nutrition, building endurance, infection prevention answering pt/daughter questions    General Comments        Pertinent Vitals/Pain      Home Living                      Prior Function            PT Goals (current goals can now be found in the  care plan section) Progress towards PT goals: Progressing toward goals;Goals met/education completed, patient discharged from PT    Frequency    Min 2X/week      PT Plan Current plan remains appropriate    Co-evaluation             End of Session Equipment Utilized During Treatment: Gait belt Activity Tolerance: Patient tolerated treatment well Patient  left: in chair;with family/visitor present;Other (comment) (does not wish phone or alarm. Daughter will take care of)     Time: 0956-1030 PT Time Calculation (min) (ACUTE ONLY): 34 min  Charges:  $Gait Training: 8-22 mins $Therapeutic Activity: 8-22 mins                    G Codes:      Larae Grooms, PTA 11/02/2016, 10:43 AM

## 2016-11-02 NOTE — Progress Notes (Signed)
Discharge instructions explained to pt and pts daughter / verbalized an understanding/ iv and tele removed/ RX given to pt/ will transport off unit via wheelchair.  

## 2016-11-02 NOTE — Telephone Encounter (Signed)
Patient contacted regarding discharge from [facility] on 11/02/16. Spoke w/ pt's daughter.   She reports that instructions/meds were reviewed in great detail and they are preparing to fil pt's pill box.  Patient understands to follow up with Dr. Rockey Situ on 11/10/16 at 8:20 at [facility]. Patient understands discharge instructions? yes Patient understands medications and regiment? yes Patient understands to bring all medications to this visit? yes

## 2016-11-02 NOTE — Progress Notes (Signed)
Patient Name: Barbara Mooney Date of Encounter: 11/02/2016  Primary Cardiologist: new  Hospital Problem List     Active Problems:   SIRS (systemic inflammatory response syndrome) (HCC)   Paroxysmal atrial fibrillation (HCC)   Essential hypertension     Subjective   Ambulated in the hallways with heart rates in the 1-teens, at rest heart rate in the 80's to 90's bpm. No SOB or chest pain.   Inpatient Medications    Scheduled Meds: . apixaban  5 mg Oral BID  . cefTRIAXone  2 g Intravenous Q24H  . diltiazem  360 mg Oral Daily  . feeding supplement (ENSURE ENLIVE)  237 mL Oral BID BM  . hydrocortisone   Rectal BID  . metoprolol succinate  100 mg Oral BID  . pantoprazole  40 mg Oral Daily  . terazosin  2 mg Oral QHS   Continuous Infusions:  PRN Meds: acetaminophen **OR** acetaminophen, diphenhydrAMINE, [DISCONTINUED] ondansetron **OR** ondansetron (ZOFRAN) IV, phenol, traMADol   Vital Signs    Vitals:   11/01/16 1117 11/01/16 1502 11/01/16 1946 11/02/16 0431  BP: (!) 142/71  138/68 127/70  Pulse: (!) 107 83 85 90  Resp: 18  18 18   Temp: 97.9 F (36.6 C)  98.3 F (36.8 C) 98.4 F (36.9 C)  TempSrc: Oral  Oral Oral  SpO2: 96% 97% 95% 92%  Weight:      Height:        Intake/Output Summary (Last 24 hours) at 11/02/16 0724 Last data filed at 11/02/16 0615  Gross per 24 hour  Intake              360 ml  Output              850 ml  Net             -490 ml   Filed Weights   10/28/16 0909 10/28/16 1620  Weight: 165 lb (74.8 kg) 175 lb 1.6 oz (79.4 kg)    Physical Exam    GEN: Well nourished, well developed, in no acute distress.  HEENT: Grossly normal.  Neck: Supple, no JVD, carotid bruits, or masses. Cardiac: Irregularly irregular, no murmurs, rubs, or gallops. No clubbing, cyanosis. Trace pre-tibial edema.  Radials/DP/PT 2+ and equal bilaterally.  Respiratory:  Respirations regular and unlabored, clear to auscultation bilaterally. GI: Soft, nontender,  nondistended, BS + x 4. MS: no deformity or atrophy. Skin: warm and dry, no rash. Neuro:  Strength and sensation are intact. Psych: AAOx3.  Normal affect.  Labs    CBC No results for input(s): WBC, NEUTROABS, HGB, HCT, MCV, PLT in the last 72 hours. Basic Metabolic Panel  Recent Labs  10/31/16 0521 11/01/16 0319  NA 136 138  K 4.3 3.9  CL 107 104  CO2 24 28  GLUCOSE 140* 146*  BUN 18 19  CREATININE 0.88 0.91  CALCIUM 8.4* 8.5*   Liver Function Tests No results for input(s): AST, ALT, ALKPHOS, BILITOT, PROT, ALBUMIN in the last 72 hours. No results for input(s): LIPASE, AMYLASE in the last 72 hours. Cardiac Enzymes No results for input(s): CKTOTAL, CKMB, CKMBINDEX, TROPONINI in the last 72 hours. BNP Invalid input(s): POCBNP D-Dimer No results for input(s): DDIMER in the last 72 hours. Hemoglobin A1C No results for input(s): HGBA1C in the last 72 hours. Fasting Lipid Panel No results for input(s): CHOL, HDL, LDLCALC, TRIG, CHOLHDL, LDLDIRECT in the last 72 hours. Thyroid Function Tests  Recent Labs  11/01/16 0319  TSH 2.340  T4TOTAL 7.1    Telemetry    Afib, 80's to 1-teens - Personally Reviewed  ECG    n/a - Personally Reviewed  Radiology    No results found.  Cardiac Studies   Echo 10/31/16: Study Conclusions  - Left ventricle: The cavity size was normal. There was mild concentric hypertrophy. Systolic function was normal. The estimated ejection fraction was in the range of 55% to 60%. Wall motion was normal; there were no regional wall motion abnormalities. - Aortic valve: Transvalvular velocity was within the normal range. There was no stenosis. There was no regurgitation. - Mitral valve: Calcified annulus. Transvalvular velocity was within the normal range. There was no evidence for stenosis. There was trivial regurgitation. - Left atrium: The atrium was moderately dilated. - Right ventricle: The cavity size was normal.  Wall thickness was normal. Systolic function was normal. - Atrial septum: No defect or patent foramen ovale was identified by color flow Doppler. - Tricuspid valve: There was mild regurgitation. - Pulmonary arteries: Systolic pressure was within the normal range. PA peak pressure: 35 mm Hg (S).  Patient Profile     27F with hypertension, diabetes and GERD who presented to the ED 10/28/16 here with sepsis from a urinary source and newly diagnosed atrial fibrillation with RVR  Assessment & Plan    1. PAF: -She remains in Afib with heart rates in the upper 80's to 90's bpm -With ambulation heart rate into the 1-teens -Continue with Cardizem CD 360 mg daily and Lopressor 100 mg bid for rate control -Continue Eliquis 5 mg bid -CHADS2VASc 5 (HTN, DM, age x 2, female) -Discussed importance of anticoagulation -If she remains in Afib in outpatient follow up after 3 full weeks of anticoagulation can pursue cardioversion via either DCCV or pharmacological  -Ambulate in the hallway this morning to assess heart rate   2. HTN: -Much improved -Continue medications as above  3. LE swelling: -Increased risk of fluid overload in the setting of Afib -Add prn Lasix 20 mg daily at discharge   Signed, Marcille Blanco [place] Pager: [phone] 11/02/2016, 7:24 AM

## 2016-11-02 NOTE — Progress Notes (Signed)
Patient requested sleep aid, new orders given by Dr. Jannifer Franklin.

## 2016-11-02 NOTE — Discharge Summary (Addendum)
[place] [facility] [place] NAME: Barbara Mooney    MR#:  [id number]  DATE OF BIRTH:  May 26, 1934  DATE OF ADMISSION:  10/28/2016   ADMITTING PHYSICIAN: Fritzi Mandes, MD  DATE OF DISCHARGE: 11/02/2016 PRIMARY CARE PHYSICIAN: Singh,Jasmine, MD   ADMISSION DIAGNOSIS:  Sepsis, due to unspecified organism ([place]) [A41.9] DISCHARGE DIAGNOSIS:  Active Problems:   SIRS (systemic inflammatory response syndrome) (HCC)   Paroxysmal atrial fibrillation (HCC)   Essential hypertension sepsis-secondary to acute cystitis. atrial fibrillation with RVR SECONDARY DIAGNOSIS:   Past Medical History:  Diagnosis Date  . Arthritis   . Bruises easily   . Diabetes mellitus   . GERD (gastroesophageal reflux disease)   . Hypertension   . Multiple thyroid nodules    HOSPITAL COURSE:   82 year old female with past medical history significant for hypertension, diabetes, GERD who recently was treated with antibiotic for a UTI comes back to the hospital secondary to sepsis.  #1 sepsis-secondary to acute cystitis. -Blood cultures Are negative and urine cultures are growing Escherichia coli. WBC is improving. -Changed meropenem to Rocephin. Discharge on keflex for a total of 7 days -Sepsis improved.  #2 sinus tachycardia/ atrial fibrillation with RVR- persistent.  increased oral Cardizem and oral metoprolol.  Continue Eliquis. -appreciate cardiology consult. ECHO EF 55-60%. Better controlled. Per cardiology, patient can be discharge home today.  #3 acute renal failure-secondary to ATN from sepsis. -Discontinued IV fluids. Improved creatinine. She was given small dose of lasix.  #4 decreased appetite-likely from infection. Encourage oral intake. -Ensure with meals. Improving  #5 Hypertension-blood pressure is controlled. On Cardizem and oral metoprolol.  continue to hold hydrochlorothiazide and losartan  #6 DVT prophylaxis-on Eliquis. Discussed with Dr.  Rockey Situ.  DISCHARGE CONDITIONS:  Stable, discharge to home with HHPT today. CONSULTS OBTAINED:  Treatment Team:  Skeet Latch, MD DRUG ALLERGIES:  No Known Allergies DISCHARGE MEDICATIONS:     Medication List    STOP taking these medications   diltiazem 240 MG 24 hr capsule Commonly known as:  DILACOR XR Replaced by:  diltiazem 360 MG 24 hr capsule   hydrochlorothiazide 25 MG tablet Commonly known as:  HYDRODIURIL   losartan 100 MG tablet Commonly known as:  COZAAR     TAKE these medications   apixaban 5 MG Tabs tablet Commonly known as:  ELIQUIS Take 1 tablet (5 mg total) by mouth 2 (two) times daily.   cefUROXime 250 MG tablet Commonly known as:  CEFTIN Take 1 tablet (250 mg total) by mouth 2 (two) times daily with a meal. X 7 days   diltiazem 360 MG 24 hr capsule Commonly known as:  CARDIZEM CD Take 1 capsule (360 mg total) by mouth daily. Replaces:  diltiazem 240 MG 24 hr capsule   feeding supplement (ENSURE ENLIVE) Liqd Take 237 mLs by mouth 2 (two) times daily between meals.   furosemide 20 MG tablet Commonly known as:  LASIX Take 1 tablet (20 mg total) by mouth daily as needed for fluid or edema (SOB).   metFORMIN 500 MG (MOD) 24 hr tablet Commonly known as:  GLUMETZA Take 500 mg by mouth 2 (two) times daily with a meal.   metoprolol succinate 100 MG 24 hr tablet Commonly known as:  TOPROL-XL Take 1 tablet (100 mg total) by mouth 2 (two) times daily. Take with or immediately following a meal. What changed:  medication strength  how much to take  when to take this  additional instructions  omeprazole 20 MG capsule Commonly known as:  PRILOSEC Take 20 mg by mouth daily.   ondansetron 4 MG disintegrating tablet Commonly known as:  ZOFRAN ODT Take 1 tablet (4 mg total) by mouth every 8 (eight) hours as needed for nausea or vomiting.            Durable Medical Equipment        Start     Ordered   11/02/16 [id number]  For home use only  DME Tub bench  Once     11/02/16 0954   11/02/16 0954  For home use only DME Bedside commode  Once    Question:  Patient needs a bedside commode to treat with the following condition  Answer:  Difficulty in walking   11/02/16 0954   11/02/16 0953  For home use only DME Walker rolling  Once    Question:  Patient needs a walker to treat with the following condition  Answer:  Difficulty in walking   11/02/16 0954       DISCHARGE INSTRUCTIONS:  See AVS.  If you experience worsening of your admission symptoms, develop shortness of breath, life threatening emergency, suicidal or homicidal thoughts you must seek medical attention immediately by calling 911 or calling your MD immediately  if symptoms less severe.  You Must read complete instructions/literature along with all the possible adverse reactions/side effects for all the Medicines you take and that have been prescribed to you. Take any new Medicines after you have completely understood and accpet all the possible adverse reactions/side effects.   Please note  You were cared for by a hospitalist during your hospital stay. If you have any questions about your discharge medications or the care you received while you were in the hospital after you are discharged, you can call the unit and asked to speak with the hospitalist on call if the hospitalist that took care of you is not available. Once you are discharged, your primary care physician will handle any further medical issues. Please note that NO REFILLS for any discharge medications will be authorized once you are discharged, as it is imperative that you return to your primary care physician (or establish a relationship with a primary care physician if you do not have one) for your aftercare needs so that they can reassess your need for medications and monitor your lab values.    On the day of Discharge:  VITAL SIGNS:  Blood pressure (!) 142/71, pulse 96, temperature 97.6 F (36.4 C),  temperature source Oral, resp. rate 18, height 5\' 6"  (1.676 m), weight 175 lb 1.6 oz (79.4 kg), SpO2 97 %. PHYSICAL EXAMINATION:  GENERAL:  82 y.o.-year-old patient sitting in chair with no acute distress.  EYES: Pupils equal, round, reactive to light and accommodation. No scleral icterus. Extraocular muscles intact.  HEENT: Head atraumatic, normocephalic. Oropharynx and nasopharynx clear.  NECK:  Supple, no jugular venous distention. No thyroid enlargement, no tenderness.  LUNGS: Normal breath sounds bilaterally, no wheezing, rales,rhonchi or crepitation. No use of accessory muscles of respiration.  CARDIOVASCULAR: S1, S2 normal. No murmurs, rubs, or gallops.  ABDOMEN: Soft, non-tender, non-distended. Bowel sounds present. No organomegaly or mass.  EXTREMITIES: No pedal edema, cyanosis, or clubbing.  NEUROLOGIC: Cranial nerves II through XII are intact. Muscle strength 4/5 in all extremities. Sensation intact. Gait not checked.  PSYCHIATRIC: The patient is alert and oriented x 3.  SKIN: No obvious rash, lesion, or ulcer.  DATA REVIEW:   CBC  Recent Labs  Lab 10/30/16 0441  WBC 6.3  HGB 9.1*  HCT 26.7*  PLT 113*    Chemistries   Recent Labs Lab 10/28/16 0951  11/01/16 0319  NA 135  < > 138  K 2.7*  < > 3.9  CL 97*  < > 104  CO2 26  < > 28  GLUCOSE 137*  < > 146*  BUN 30*  < > 19  CREATININE 1.70*  < > 0.91  CALCIUM 8.5*  < > 8.5*  AST 18  --   --   ALT 14  --   --   ALKPHOS 71  --   --   BILITOT 0.8  --   --   < > = values in this interval not displayed.   Microbiology Results  Results for orders placed or performed during the hospital encounter of 10/28/16  Blood Culture (routine x 2)     Status: Abnormal   Collection Time: 10/28/16  9:52 AM  Result Value Ref Range Status   Specimen Description BLOOD RIGHT ARM  Final   Special Requests   Final    BOTTLES DRAWN AEROBIC AND ANAEROBIC AER 6ML ANA 9ML   Culture  Setup Time   Final    GRAM NEGATIVE RODS IN BOTH  AEROBIC AND ANAEROBIC BOTTLES CRITICAL VALUE NOTED.  VALUE IS CONSISTENT WITH PREVIOUSLY REPORTED AND CALLED VALUE. SDR    Culture (A)  Final    ESCHERICHIA COLI SUSCEPTIBILITIES PERFORMED ON PREVIOUS CULTURE WITHIN THE LAST 5 DAYS. Performed at [facility]    Report Status 11/01/2016 FINAL  Final  Blood Culture (routine x 2)     Status: Abnormal   Collection Time: 10/28/16  9:52 AM  Result Value Ref Range Status   Specimen Description BLOOD RIGHT WRIST  Final   Special Requests   Final    BOTTLES DRAWN AEROBIC AND ANAEROBIC ANA 7ML AER 6ML   Culture  Setup Time   Final    GRAM NEGATIVE RODS IN BOTH AEROBIC AND ANAEROBIC BOTTLES CRITICAL RESULT CALLED TO, READ BACK BY AND VERIFIED WITH: MATT MCBANE ON 10/29/16 AT 0240 BY MSS    Culture ESCHERICHIA COLI (A)  Final   Report Status 10/31/2016 FINAL  Final   Organism ID, Bacteria ESCHERICHIA COLI  Final      Susceptibility   Escherichia coli - MIC*    AMPICILLIN >=32 RESISTANT Resistant     CEFAZOLIN <=4 SENSITIVE Sensitive     CEFEPIME <=1 SENSITIVE Sensitive     CEFTAZIDIME <=1 SENSITIVE Sensitive     CEFTRIAXONE <=1 SENSITIVE Sensitive     CIPROFLOXACIN <=0.25 SENSITIVE Sensitive     GENTAMICIN <=1 SENSITIVE Sensitive     IMIPENEM <=0.25 SENSITIVE Sensitive     TRIMETH/SULFA <=20 SENSITIVE Sensitive     AMPICILLIN/SULBACTAM >=32 RESISTANT Resistant     PIP/TAZO <=4 SENSITIVE Sensitive     Extended ESBL NEGATIVE Sensitive     * ESCHERICHIA COLI  Blood Culture ID Panel (Reflexed)     Status: Abnormal   Collection Time: 10/28/16  9:52 AM  Result Value Ref Range Status   Enterococcus species NOT DETECTED NOT DETECTED Final   Listeria monocytogenes NOT DETECTED NOT DETECTED Final   Staphylococcus species NOT DETECTED NOT DETECTED Final   Staphylococcus aureus NOT DETECTED NOT DETECTED Final   Streptococcus species NOT DETECTED NOT DETECTED Final   Streptococcus agalactiae NOT DETECTED NOT DETECTED Final    Streptococcus pneumoniae NOT DETECTED NOT DETECTED Final   Streptococcus  pyogenes NOT DETECTED NOT DETECTED Final   Acinetobacter baumannii NOT DETECTED NOT DETECTED Final   Enterobacteriaceae species DETECTED (A) NOT DETECTED Final    Comment: CRITICAL RESULT CALLED TO, READ BACK BY AND VERIFIED WITH: MATT MCBANE AT 0240 10/29/16 MSS    Enterobacter cloacae complex NOT DETECTED NOT DETECTED Final   Escherichia coli DETECTED (A) NOT DETECTED Final    Comment: CRITICAL RESULT CALLED TO, READ BACK BY AND VERIFIED WITH: MATT MCBANE AT 0240 10/29/16 MSS.    Klebsiella oxytoca NOT DETECTED NOT DETECTED Final   Klebsiella pneumoniae NOT DETECTED NOT DETECTED Final   Proteus species NOT DETECTED NOT DETECTED Final   Serratia marcescens NOT DETECTED NOT DETECTED Final   Carbapenem resistance NOT DETECTED NOT DETECTED Final   Haemophilus influenzae NOT DETECTED NOT DETECTED Final   Neisseria meningitidis NOT DETECTED NOT DETECTED Final   Pseudomonas aeruginosa NOT DETECTED NOT DETECTED Final   Candida albicans NOT DETECTED NOT DETECTED Final   Candida glabrata NOT DETECTED NOT DETECTED Final   Candida krusei NOT DETECTED NOT DETECTED Final   Candida parapsilosis NOT DETECTED NOT DETECTED Final   Candida tropicalis NOT DETECTED NOT DETECTED Final  Urine culture     Status: Abnormal   Collection Time: 10/28/16  1:23 PM  Result Value Ref Range Status   Specimen Description URINE, RANDOM  Final   Special Requests NONE  Final   Culture 10,000 COLONIES/mL ESCHERICHIA COLI (A)  Final   Report Status 10/30/2016 FINAL  Final   Organism ID, Bacteria ESCHERICHIA COLI (A)  Final      Susceptibility   Escherichia coli - MIC*    AMPICILLIN >=32 RESISTANT Resistant     CEFAZOLIN <=4 SENSITIVE Sensitive     CEFTRIAXONE <=1 SENSITIVE Sensitive     CIPROFLOXACIN <=0.25 SENSITIVE Sensitive     GENTAMICIN <=1 SENSITIVE Sensitive     IMIPENEM <=0.25 SENSITIVE Sensitive     NITROFURANTOIN <=16 SENSITIVE  Sensitive     TRIMETH/SULFA <=20 SENSITIVE Sensitive     AMPICILLIN/SULBACTAM 16 INTERMEDIATE Intermediate     PIP/TAZO 64 INTERMEDIATE Intermediate     Extended ESBL NEGATIVE Sensitive     * 10,000 COLONIES/mL ESCHERICHIA COLI  MRSA PCR Screening     Status: None   Collection Time: 10/28/16  6:19 PM  Result Value Ref Range Status   MRSA by PCR NEGATIVE NEGATIVE Final    Comment:        The GeneXpert MRSA Assay (FDA approved for NASAL specimens only), is one component of a comprehensive MRSA colonization surveillance program. It is not intended to diagnose MRSA infection nor to guide or monitor treatment for MRSA infections.     RADIOLOGY:  No results found.   Management plans discussed with the patient, her daughter and they are in agreement.  CODE STATUS:     Code Status Orders        Start     Ordered   10/28/16 1442  Full code  Continuous     10/28/16 1441    Code Status History    Date Active Date Inactive Code Status Order ID Comments User Context   10/28/2016  1:51 PM 10/28/2016  2:41 PM DNR [id number]  Fritzi Mandes, MD ED      TOTAL TIME TAKING CARE OF THIS PATIENT: 37 minutes.    Demetrios Loll M.D on 11/02/2016 at 2:17 PM  Between 7am to 6pm - Pager - [phone]  After 6pm go to [email or web address] - [place]  [facility]  Office  [phone]  CC: Primary care physician; Glendon Axe, MD   Note: This dictation was prepared with Dragon dictation along with smaller phrase technology. Any transcriptional errors that result from this process are unintentional.

## 2016-11-02 NOTE — Discharge Instructions (Signed)
Heart healthy diet. °HHPT °

## 2016-11-02 NOTE — Plan of Care (Signed)
Problem: Activity: Goal: Risk for activity intolerance will decrease Outcome: Completed/Met Date Met: 11/02/16 Patient is ambulating in the hallway with a walker escorted with daughter. No dyspnea or complications noted.

## 2016-11-02 NOTE — Care Management (Signed)
Patient for discharge home today.  Notified [facility] and agency will contact this evening to schedule home visit for 11.22.  Obtained scripts for walker, tub bench and bsc.  Referral to [facility] and will be delivered to patient's room.  Provided med alert information. Confirmed with daughter [name] has Eliquis 30 day coupon.

## 2016-11-02 NOTE — Telephone Encounter (Signed)
Attempted to contact pt. Her husband states that she has not arrived home yet, he is "standing in the door waiting for her". Advised him that I will attempt to call her back shortly.

## 2016-11-04 LAB — CULTURE, BLOOD (ROUTINE X 2)

## 2016-11-10 ENCOUNTER — Ambulatory Visit (INDEPENDENT_AMBULATORY_CARE_PROVIDER_SITE_OTHER): Payer: Medicare Other | Admitting: Cardiovascular Disease

## 2016-11-10 ENCOUNTER — Encounter: Payer: Self-pay | Admitting: Cardiovascular Disease

## 2016-11-10 VITALS — BP 126/70 | HR 94 | Ht 66.0 in | Wt 168.5 lb

## 2016-11-10 DIAGNOSIS — A419 Sepsis, unspecified organism: Secondary | ICD-10-CM

## 2016-11-10 DIAGNOSIS — D649 Anemia, unspecified: Secondary | ICD-10-CM

## 2016-11-10 DIAGNOSIS — I480 Paroxysmal atrial fibrillation: Secondary | ICD-10-CM

## 2016-11-10 DIAGNOSIS — R2681 Unsteadiness on feet: Secondary | ICD-10-CM

## 2016-11-10 DIAGNOSIS — I10 Essential (primary) hypertension: Secondary | ICD-10-CM | POA: Diagnosis not present

## 2016-11-10 NOTE — Patient Instructions (Signed)
Medication Instructions:   No medication changes made  Labwork:  No new labs needed  Testing/Procedures:  No further testing at this time   I recommend watching educational videos on topics of interest to you at:       [email or web address]  Enter code: [email or web address]    Follow-Up: It was a pleasure seeing you in the office today. Please call us if you have new issues that need to be addressed before your next appt.  [phone]  Your physician wants you to follow-up in: 1 month.    If you need a refill on your cardiac medications before your next appointment, please call your pharmacy.

## 2016-11-10 NOTE — Progress Notes (Signed)
Cardiology Office Note  Date:  11/10/2016   ID:  Barbara Mooney, DOB 12-11-1934, MRN [id number]  PCP:  Glendon Axe, MD   Chief Complaint  Patient presents with  . Other    F/u hospital no complaints today. Meds reviewed verbally.    HPI:  [name] is an 83F with hypertension, diabetes and GERD history of sepsis November 2017, noted to be in atrial fibrillation with RVR, seen by cardiology who presents to establish care in the [facility] office today after recent hospital discharge  She presented to the ED 10/28/16 with generalized weakness, fatigue, fever and chills.  found to have sepsis secondary to a urinary source   hypotensive , atrial fibrillation  Echocardiogram done in the hospital showed concentric hypertrophy. Systolic function was normal. The estimated ejection fraction was in the range of 55% to 60%.   She was asymptomatic from her atrial fibrillation  Metoprolol and Cardizem added for rate control  Currently taking Eliquis 5mg  bid.    This patients CHA2DS2-VASc Score and unadjusted Ischemic Stroke Rate (% per year) is equal to 7.2 % stroke rate/year from a score of 5  Above score calculated as 1 point each if present [CHF, HTN, DM, Vascular=MI/PAD/Aortic Plaque, Age if 65-74, or Female] Above score calculated as 2 points each if present [Age > 75, or Stroke/TIA/TE]   in follow-up today she feels fine, blood pressure stable, she would like to do more but her family is limiting her activities. She would like to start driving but family is nervous. Family presents with her to the office today Denies any tachycardia, shortness of breath. She has Lasix but has not had to take this, denies any leg swelling or shortness of breath on exertion.  Daughter feels she is tolerating atrial fibrillation well without any significant symptoms  EKG on today's visit shows atrial fibrillation with ventricular rate 94 bpm, right bundle block  PMH:   has a past medical history of  Arthritis; Bruises easily; Diabetes mellitus; GERD (gastroesophageal reflux disease); Hypertension; and Multiple thyroid nodules.  PSH:   History reviewed. No pertinent surgical history.  Current Outpatient Prescriptions  Medication Sig Dispense Refill  . apixaban (ELIQUIS) 5 MG TABS tablet Take 1 tablet (5 mg total) by mouth 2 (two) times daily. 60 tablet 2  . diltiazem (CARDIZEM CD) 360 MG 24 hr capsule Take 1 capsule (360 mg total) by mouth daily. 30 capsule 1  . feeding supplement, ENSURE ENLIVE, (ENSURE ENLIVE) LIQD Take 237 mLs by mouth 2 (two) times daily between meals. 237 mL 12  . furosemide (LASIX) 20 MG tablet Take 1 tablet (20 mg total) by mouth daily as needed for fluid or edema (SOB). 30 tablet 5  . metFORMIN (GLUMETZA) 500 MG (MOD) 24 hr tablet Take 500 mg by mouth 2 (two) times daily with a meal.     . metoprolol succinate (TOPROL-XL) 100 MG 24 hr tablet Take 1 tablet (100 mg total) by mouth 2 (two) times daily. Take with or immediately following a meal. 60 tablet 1  . omeprazole (PRILOSEC) 20 MG capsule Take 1 capsule (20 mg total) by mouth daily. 30 capsule 6  . ondansetron (ZOFRAN ODT) 4 MG disintegrating tablet Take 1 tablet (4 mg total) by mouth every 8 (eight) hours as needed for nausea or vomiting. 20 tablet 0   No current facility-administered medications for this visit.      Allergies:   Patient has no known allergies.   Social History:  The patient  reports that she has never smoked. She has never used smokeless tobacco. She reports that she does not drink alcohol or use drugs.   Family History:   family history includes Breast cancer (age of onset: 78) in her sister; Ovarian cancer in her maternal aunt.    Review of Systems: Review of Systems  Constitutional: Negative.   Respiratory: Negative.   Cardiovascular: Negative.   Gastrointestinal: Negative.   Musculoskeletal: Negative.   Neurological: Negative.   Psychiatric/Behavioral: Negative.   All other  systems reviewed and are negative.    PHYSICAL EXAM: VS:  BP 126/70 (BP Location: Left Arm, Patient Position: Sitting, Cuff Size: Normal)   Pulse 94   Ht 5\' 6"  (1.676 m)   Wt 168 lb 8 oz (76.4 kg)   BMI 27.20 kg/m  , BMI Body mass index is 27.2 kg/m. GEN: Well nourished, well developed, in no acute distress  HEENT: normal  Neck: no JVD, carotid bruits, or masses Cardiac: Irregularly irregular,  no murmurs, rubs, or gallops,no edema  Respiratory:  clear to auscultation bilaterally, normal work of breathing GI: soft, nontender, nondistended, + BS MS: no deformity or atrophy  Skin: warm and dry, no rash Neuro:  Strength and sensation are intact Psych: euthymic mood, full affect    Recent Labs: 10/28/2016: ALT 14 10/30/2016: Hemoglobin 9.1; Platelets 113 11/01/2016: BUN 19; Creatinine, Ser 0.91; Potassium 3.9; Sodium 138; TSH 2.340    Lipid Panel No results found for: CHOL, HDL, LDLCALC, TRIG    Wt Readings from Last 3 Encounters:  11/10/16 168 lb 8 oz (76.4 kg)  10/28/16 175 lb 1.6 oz (79.4 kg)  09/25/12 181 lb 6.4 oz (82.3 kg)       ASSESSMENT AND PLAN:  Paroxysmal atrial fibrillation ([place]) - Plan: EKG 12-Lead Long discussion concerning paroxysmal atrial fibrillation All questions answered Recommended she continue her current medications, monitor heart rate at home Visiting nurses are monitoring blood pressure and heart rate We will meet again in 1 month's time consider starting medication/antiarrhythmic in effort to restore normal sinus rhythm. If this does not restore normal rhythm, may need cardioversion. Samples of eliquis provided.  Prescription sent to pharmacy  timing of her atrial fibrillation unclear She reports weakness, general malaise for one year prior to hospitalization  Essential hypertension Blood pressure is well controlled on today's visit. No changes made to the medications.  Sepsis, due to unspecified organism [facility]) Recently recovered from  sepsis. Hospital records reviewed  Anemia, unspecified type  hematocrit 26.7 likely in the setting of sepsis We'll need to be closely monitored Would order new CVC as she is on blood thinners  Unsteady gait  slow recovery, feeling stronger Doing physical therapy at home   Total encounter time more than 45 minutes  Greater than 50% was spent in counseling and coordination of care with the patient   Disposition:   F/U  1 month   Orders Placed This Encounter  Procedures  . EKG 12-Lead     Signed, Esmond Plants, M.D., Ph.D. 11/10/2016  [place], [place]

## 2016-11-11 ENCOUNTER — Other Ambulatory Visit
Admission: RE | Admit: 2016-11-11 | Discharge: 2016-11-11 | Disposition: A | Discharge status: Home / Self Care | Payer: Medicare Other | Source: Ambulatory Visit | Attending: Cardiovascular Disease | Admitting: Cardiovascular Disease

## 2016-11-11 ENCOUNTER — Telehealth: Payer: Self-pay | Admitting: Cardiovascular Disease

## 2016-11-11 DIAGNOSIS — R710 Precipitous drop in hematocrit: Secondary | ICD-10-CM | POA: Diagnosis present

## 2016-11-11 LAB — CBC WITH DIFFERENTIAL/PLATELET
BASOS ABS: 0.1 10*3/uL (ref 0–0.1)
Basophils Relative: 1 %
Eosinophils Absolute: 0.1 10*3/uL (ref 0–0.7)
Eosinophils Relative: 2 %
HEMATOCRIT: 35 % (ref 35.0–47.0)
Hemoglobin: 11.9 g/dL — ABNORMAL LOW (ref 12.0–16.0)
LYMPHS ABS: 0.7 10*3/uL — AB (ref 1.0–3.6)
LYMPHS PCT: 9 %
MCH: 30.8 pg (ref 26.0–34.0)
MCHC: 34 g/dL (ref 32.0–36.0)
MCV: 90.6 fL (ref 80.0–100.0)
Monocytes Absolute: 0.6 10*3/uL (ref 0.2–0.9)
Monocytes Relative: 8 %
NEUTROS ABS: 6.3 10*3/uL (ref 1.4–6.5)
Neutrophils Relative %: 80 %
Platelets: 445 10*3/uL — ABNORMAL HIGH (ref 150–440)
RBC: 3.87 MIL/uL (ref 3.80–5.20)
RDW: 16.3 % — ABNORMAL HIGH (ref 11.5–14.5)
WBC: 7.7 10*3/uL (ref 3.6–11.0)

## 2016-11-11 NOTE — Telephone Encounter (Signed)
Spoke w/ pt.  Advised her that Dr. Rockey Situ reviewed her recent labs, HCT 26 on 10/30/16. He requests that she come over for STAT CBC today. She will try to get a ride and proceed to [facility] as soon as she can. Asked her to call back if she will be unable to do so today.

## 2016-11-23 ENCOUNTER — Telehealth: Payer: Self-pay | Admitting: Cardiovascular Disease

## 2016-11-23 NOTE — Telephone Encounter (Signed)
Pt daughter is calling regarding PA for Eliquis. States pt will need this refilled in a week. Please call. Ok to leave detailed message

## 2016-11-24 NOTE — Telephone Encounter (Signed)
Pt has been approved for Eliquis 5 mg tablet  Case# [id number] 08/26/16-11/24/19 PA will not be required first of January due to medication being on Formulary.

## 2016-11-25 NOTE — Telephone Encounter (Signed)
Please advise pt alternative pt can't afford after PA.

## 2016-11-25 NOTE — Telephone Encounter (Signed)
Options include changing to warfarin Second option would be see if price gets cheaper in January Could also check price of Xarelto (once a day medication). Will likely be similar

## 2016-11-25 NOTE — Telephone Encounter (Signed)
Pt daughter asking if we can see if there is another option Maybe a generic version of this They went to pharmacy and insurance is only paying but just a little  Can't afford it  Please advise Also would like to know what to do since patient will be out in about a week

## 2016-11-26 NOTE — Telephone Encounter (Signed)
Spoke w/ [name].  Advised her of Dr. Donivan Scull recommendation. Advised her that I am leaving samples of Eliquis 5 mg at the front desk for her to p/u at her convenience.  Pt has appt on 12/20/16, she will discuss other options w/ Dr. Rockey Situ at that time.

## 2016-12-13 DIAGNOSIS — C8290 Follicular lymphoma, unspecified, unspecified site: Secondary | ICD-10-CM

## 2016-12-13 DIAGNOSIS — C8590 Non-Hodgkin lymphoma, unspecified, unspecified site: Secondary | ICD-10-CM | POA: Insufficient documentation

## 2016-12-13 HISTORY — DX: Non-Hodgkin lymphoma, unspecified, unspecified site: C85.90

## 2016-12-13 HISTORY — DX: Follicular lymphoma, unspecified, unspecified site: C82.90

## 2016-12-13 HISTORY — PX: BONE BIOPSY: SHX375

## 2016-12-17 ENCOUNTER — Other Ambulatory Visit: Payer: Self-pay | Admitting: Physician Assistant

## 2016-12-17 DIAGNOSIS — Z1231 Encounter for screening mammogram for malignant neoplasm of breast: Secondary | ICD-10-CM

## 2016-12-20 ENCOUNTER — Encounter: Payer: Self-pay | Admitting: Cardiovascular Disease

## 2016-12-20 ENCOUNTER — Ambulatory Visit (INDEPENDENT_AMBULATORY_CARE_PROVIDER_SITE_OTHER): Payer: Medicare Other | Admitting: Cardiovascular Disease

## 2016-12-20 VITALS — BP 120/80 | HR 84 | Ht 65.0 in | Wt 167.5 lb

## 2016-12-20 DIAGNOSIS — I480 Paroxysmal atrial fibrillation: Secondary | ICD-10-CM | POA: Diagnosis not present

## 2016-12-20 MED ORDER — AMIODARONE HCL 200 MG PO TABS: 200.0000 mg | ORAL_TABLET | Freq: Two times a day (BID) | ORAL | 3 refills | Status: DC

## 2016-12-20 NOTE — Patient Instructions (Addendum)
Medication Instructions:   Please start amiodarone 2 pills twice a day for 5 days Then decrease down to one pill twice a day    Labwork:  No new labs needed  Testing/Procedures:  EKG in 2 to 3 weeks If still in atrial fibrillation, we can schedule a cardioversion   I recommend watching educational videos on topics of interest to you at:       [email or web address]  Enter code: HEARTCARE    Follow-Up: It was a pleasure seeing you in the office today. Please call us if you have new issues that need to be addressed before your next appt.  [phone]  Your physician wants you to follow-up in: 6 weeks    If you need a refill on your cardiac medications before your next appointment, please call your pharmacy.    \

## 2016-12-20 NOTE — Progress Notes (Signed)
Cardiology Office Note  Date:  12/20/2016   ID:  Barbara Mooney, DOB 1934-04-29, MRN [id number]  PCP:  Barbara Axe, MD   Chief Complaint  Patient presents with  . other    1 month follow up. Meds reviewed by the pt. verbally. Pt. took a fall and bruised up her right knee, she is concerned since on eliquis.     HPI:  Barbara Mooney is an 84F with hypertension, diabetes and GERD history of sepsis November 2017, noted to be in atrial fibrillation with RVR, Who presents for routine follow-up of her persistent atrial fibrillation   ED 10/28/16 with generalized weakness, fatigue, fever and chills.  found to have sepsis secondary to a urinary source   hypotensive , atrial fibrillation  Echocardiogram done in the hospital showed concentric hypertrophy. Systolic function was normal. The estimated ejection fraction was in the range of 55% to 60%.   She was asymptomatic from her atrial fibrillation  Metoprolol and Cardizem added for rate control  Currently taking Eliquis 5mg  bid.    This patients CHA2DS2-VASc Score and unadjusted Ischemic Stroke Rate (% per year) is equal to 7.2 % stroke rate/year from a score of 5  Above score calculated as 1 point each if present [CHF, HTN, DM, Vascular=MI/PAD/Aortic Plaque, Age if 65-74, or Female] Above score calculated as 2 points each if present [Age > 75, or Stroke/TIA/TE]   in follow-up today she feels fine, She has been on anticoagulation for at least one month, interested in restoring normal sinus rhythm. Denies any significant shortness of breath, leg swelling Daughter feels she is tolerating atrial fibrillation well without any significant symptoms Still tired after her hospital admission last month  EKG on today's visit shows atrial fibrillation with ventricular rate 84 bpm, right bundle block  PMH:   has a past medical history of Arthritis; Bruises easily; Diabetes mellitus; GERD (gastroesophageal reflux disease); Hypertension; and Multiple  thyroid nodules.  PSH:   No past surgical history on file.  Current Outpatient Prescriptions  Medication Sig Dispense Refill  . apixaban (ELIQUIS) 5 MG TABS tablet Take 1 tablet (5 mg total) by mouth 2 (two) times daily. 60 tablet 2  . diltiazem (CARDIZEM CD) 360 MG 24 hr capsule Take 1 capsule (360 mg total) by mouth daily. 30 capsule 1  . feeding supplement, ENSURE ENLIVE, (ENSURE ENLIVE) LIQD Take 237 mLs by mouth 2 (two) times daily between meals. 237 mL 12  . furosemide (LASIX) 20 MG tablet Take 1 tablet (20 mg total) by mouth daily as needed for fluid or edema (SOB). 30 tablet 5  . metFORMIN (GLUMETZA) 500 MG (MOD) 24 hr tablet Take 500 mg by mouth 2 (two) times daily with a meal.     . metoprolol succinate (TOPROL-XL) 100 MG 24 hr tablet Take 1 tablet (100 mg total) by mouth 2 (two) times daily. Take with or immediately following a meal. 60 tablet 1  . omeprazole (PRILOSEC) 20 MG capsule Take 1 capsule (20 mg total) by mouth daily. 30 capsule 6  . ondansetron (ZOFRAN ODT) 4 MG disintegrating tablet Take 1 tablet (4 mg total) by mouth every 8 (eight) hours as needed for nausea or vomiting. 20 tablet 0   No current facility-administered medications for this visit.      Allergies:   Patient has no known allergies.   Social History:  The patient  reports that she has never smoked. She has never used smokeless tobacco. She reports that she does not drink alcohol  or use drugs.   Family History:   family history includes Breast cancer (age of onset: 30) in her sister; Ovarian cancer in her maternal aunt.    Review of Systems: Review of Systems  Constitutional: Positive for malaise/fatigue.  Respiratory: Negative.   Cardiovascular: Negative.   Gastrointestinal: Negative.   Musculoskeletal: Negative.   Neurological: Positive for weakness.  Psychiatric/Behavioral: Negative.   All other systems reviewed and are negative.    PHYSICAL EXAM: VS:  BP 120/80 (BP Location: Left Arm,  Patient Position: Sitting, Cuff Size: Normal)   Pulse 84   Ht 5\' 5"  (1.651 m)   Wt 167 lb 8 oz (76 kg)   BMI 27.87 kg/m  , BMI Body mass index is 27.87 kg/m. GEN: Well nourished, well developed, in no acute distress  HEENT: normal  Neck: no JVD, carotid bruits, or masses Cardiac: Irregularly irregular,  no murmurs, rubs, or gallops,no edema  Respiratory:  clear to auscultation bilaterally, normal work of breathing GI: soft, nontender, nondistended, + BS MS: no deformity or atrophy  Skin: warm and dry, no rash Neuro:  Strength and sensation are intact Psych: euthymic mood, full affect    Recent Labs: 10/28/2016: ALT 14 11/01/2016: BUN 19; Creatinine, Ser 0.91; Potassium 3.9; Sodium 138; TSH 2.340 11/11/2016: Hemoglobin 11.9; Platelets 445    Lipid Panel No results found for: CHOL, HDL, LDLCALC, TRIG    Wt Readings from Last 3 Encounters:  12/20/16 167 lb 8 oz (76 kg)  11/10/16 168 lb 8 oz (76.4 kg)  10/28/16 175 lb 1.6 oz (79.4 kg)       ASSESSMENT AND PLAN:  Paroxysmal atrial fibrillation ([facility]) - Plan: EKG 12-Lead Long discussion concerning paroxysmal atrial fibrillation Discussed with her daughter After discussing various treatment options, she is interested in trying to restore normal sinus rhythm Given moderately dilated left atrium, will use medication in addition to possible cardioversion in 2-3 weeks time We'll start amiodarone 40 mg twice a day for 5 days then titrate down to 200 mg twice a day, EKG in 3 weeks' time. If still in atrial fibrillation, would then schedule cardioversion Unclear how long she is been in atrial fibrillation  Essential hypertension Blood pressure is well controlled on today's visit. No changes made to the medications.  Sepsis, due to unspecified organism [facility]) Recently recovered from sepsis. Still feels somewhat weak  Anemia, unspecified type Blood count improved, hematocrit 35  Unsteady gait She completed home PT   Total  encounter time more than 25 minutes  Greater than 50% was spent in counseling and coordination of care with the patient   Disposition:   F/U  6 weeks    Orders Placed This Encounter  Procedures  . EKG 12-Lead     Signed, Barbara Plants, M.D., Ph.D. 12/20/2016  [place], [place]

## 2017-01-03 ENCOUNTER — Ambulatory Visit: Payer: Self-pay | Admitting: Urology

## 2017-01-04 NOTE — Progress Notes (Addendum)
01/05/2017 9:41 AM   Barbara Mooney 25-Sep-1934 [id number]  Referring provider: Marinda Elk, MD [facility] [place], [place] [address]  Chief Complaint  Patient presents with  . New Patient (Initial Visit)    Recurrent UTI    HPI: Patient is a 83 -year-old Caucasian female who is referred to [place] by, Barbara Elk, MD, for recurrent urinary tract infections who presents with her daughter, Barbara Mooney.    Patient states that she has had five urinary tract infections over the last year.  Her symptoms with a urinary tract infection consist of flu like symptoms, dysuria, chills, lower back pain and malodorous urine.   She denies gross hematuria, suprapubic pain, back pain, abdominal pain or flank pain.   She has not had any recent fevers, chills, nausea or vomiting.   She does not have a history of nephrolithiasis, GU surgery or GU trauma.    Reviewing her records,  she has had five documented positive urine cultures.    06/16/2016 Klebsiella - variable resistant pattern  10/06/2016 E. Coli - variable resistant pattern  10/28/2016 E. Coli - variable resistant pattern (admitted for sepsis)  11/10/2016 Enterococcus - variable resistant pattern  11/22/2016 E. Coli - variable resistant pattern  She is not sexually active.  She is post menopausal.   She denies constipation and/or diarrhea.  She does engage in good perineal hygiene. She does not take tub baths.   She has not had any recent imaging studies.    She is not drinking enough of water daily.  One cup of coffee daily.    Today, she is complaining of frequency x 6, urgency, nocturia x 3 -4 and incontinence that occurs mostly in the evenings.  She does not wear incontinent pads regularly.     PMH: Past Medical History:  Diagnosis Date  . Arthritis   . Bruises easily   . Diabetes mellitus   . GERD (gastroesophageal reflux disease)   . Hypertension   . Multiple thyroid nodules      Surgical History: History reviewed. No pertinent surgical history.  Home Medications:  Allergies as of 01/05/2017   No Known Allergies     Medication List       Accurate as of 01/05/17  9:41 AM. Always use your most recent med list.          amiodarone 200 MG tablet Commonly known as:  PACERONE Take 1 tablet (200 mg total) by mouth 2 (two) times daily.   apixaban 5 MG Tabs tablet Commonly known as:  ELIQUIS Take 1 tablet (5 mg total) by mouth 2 (two) times daily.   diltiazem 360 MG 24 hr capsule Commonly known as:  CARDIZEM CD Take 1 capsule (360 mg total) by mouth daily.   feeding supplement (ENSURE ENLIVE) Liqd Take 237 mLs by mouth 2 (two) times daily between meals.   furosemide 20 MG tablet Commonly known as:  LASIX Take 1 tablet (20 mg total) by mouth daily as needed for fluid or edema (SOB).   metFORMIN 500 MG (MOD) 24 hr tablet Commonly known as:  GLUMETZA Take 500 mg by mouth 2 (two) times daily with a meal.   metoprolol succinate 100 MG 24 hr tablet Commonly known as:  TOPROL-XL Take 1 tablet (100 mg total) by mouth 2 (two) times daily. Take with or immediately following a meal.   omeprazole 20 MG capsule Commonly known as:  PRILOSEC Take 1 capsule (20 mg total)  by mouth daily.   ondansetron 4 MG disintegrating tablet Commonly known as:  ZOFRAN ODT Take 1 tablet (4 mg total) by mouth every 8 (eight) hours as needed for nausea or vomiting.       Allergies: No Known Allergies  Family History: Family History  Problem Relation Age of Onset  . Breast cancer Sister 65  . Ovarian cancer Maternal Aunt   . Kidney cancer Neg Hx   . Bladder Cancer Neg Hx     Social History:  reports that she has never smoked. She has never used smokeless tobacco. She reports that she does not drink alcohol or use drugs.  ROS: UROLOGY Frequent Urination?: Yes Hard to postpone urination?: Yes Burning/pain with urination?: No Get up at night to urinate?:  Yes Leakage of urine?: Yes Urine stream starts and stops?: No Trouble starting stream?: No Do you have to strain to urinate?: No Blood in urine?: No Urinary tract infection?: Yes Sexually transmitted disease?: No Injury to kidneys or bladder?: No Painful intercourse?: No Weak stream?: No Currently pregnant?: No Vaginal bleeding?: No Last menstrual period?: n  Gastrointestinal Nausea?: Yes Vomiting?: No Indigestion/heartburn?: Yes Diarrhea?: No Constipation?: No  Constitutional Fever: No Night sweats?: No Weight loss?: No Fatigue?: Yes  Skin Skin rash/lesions?: No Itching?: Yes  Eyes Blurred vision?: No Double vision?: No  Ears/Nose/Throat Sore throat?: No Sinus problems?: Yes  Hematologic/Lymphatic Swollen glands?: No Easy bruising?: Yes  Cardiovascular Leg swelling?: Yes Chest pain?: No  Respiratory Cough?: No Shortness of breath?: No  Endocrine Excessive thirst?: No  Musculoskeletal Back pain?: No Joint pain?: Yes  Neurological Headaches?: No Dizziness?: No  Psychologic Depression?: No Anxiety?: No  Physical Exam: BP (!) 185/83 (BP Location: Left Arm, Patient Position: Sitting, Cuff Size: Normal)   Pulse 71   Ht 5\' 5"  (1.651 m)   Wt 169 lb 12.8 oz (77 kg)   BMI 28.26 kg/m   Constitutional: Well nourished. Alert and oriented, No acute distress. HEENT: [place] AT, moist mucus membranes. Trachea midline, no masses. Cardiovascular: No clubbing, cyanosis, or edema. Respiratory: Normal respiratory effort, no increased work of breathing. GI: Abdomen is soft, non tender, non distended, no abdominal masses. Liver and spleen not palpable.  No hernias appreciated.  Stool sample for occult testing is not indicated.   GU: No CVA tenderness.  No bladder fullness or masses.  Atrophic external genitalia, normal pubic hair distribution, no lesions.  Normal urethral meatus, no lesions, no prolapse, no discharge.   No urethral masses, tenderness and/or  tenderness. A 4 mm x 10 mm vaginal polyp is seen adjacent to the right side of the urethra.  No bladder fullness, tenderness or masses. Pale vagina mucosa, poor estrogen effect, no discharge, no lesions, good pelvic support, Grade II cystocele is noted.  No rectocele noted.  No cervical motion tenderness.  Uterus is freely mobile and non-fixed.  No adnexal/parametria masses or tenderness noted.  Anus and perineum with large polyp/external hemorrhoid.  Patient states it has been there for years.   Skin: No rashes, bruises or suspicious lesions. Lymph: No cervical or inguinal adenopathy. Neurologic: Grossly intact, no focal deficits, moving all 4 extremities. Psychiatric: Normal mood and affect.  Laboratory Data: Lab Results  Component Value Date   WBC 7.7 11/11/2016   HGB 11.9 (L) 11/11/2016   HCT 35.0 11/11/2016   MCV 90.6 11/11/2016   PLT 445 (H) 11/11/2016    Lab Results  Component Value Date   CREATININE 0.91 11/01/2016  Lab Results  Component Value Date   TSH 2.340 11/01/2016     Lab Results  Component Value Date   AST 18 10/28/2016   Lab Results  Component Value Date   ALT 14 10/28/2016    Pertinent Imaging: Results for LIORA, AMRINE (MRN [id number]) as of 01/05/2017 09:06  Ref. Range 01/05/2017 08:52  Scan Result Unknown 79    Assessment & Plan:    1. Recurrent UTI"s  - Patient is instructed to increase her water intake until the urine is pale yellow or clear.  I have advised her to take probiotics (yogurt, oral pills or vaginal suppositories), take cranberry pills or drink the juice and use the estrogen cream.  She is to take Vitamin C 1,000 mg daily to acidify the urine.  She should also avoid soaking in tubs and wipe front to back after urinating.  She may benefit from core strengthening exercises.  We can refer her to PT if she desires.    - Because of her history of recurrent UTI's, I have asked the patient to contact our office if she should experience  symptoms of urinary tract infection so that we can CATH her for an urine specimen for urinalysis and culture. This is to prevent a skin contaminant from showing up in the urine culture.  If she should have her symptoms after hours or cannot get to our office, she should notify her other providers that she needs a catheterized specimen for UA and culture.   - I reviewed the symptoms of a urinary tract infection, such as a worsening of urinary urgency and frequency, dysuria, which is painful urination and not the pain of urine hitting sensitive perineal skin, hematuria, foul-smelling urine, suprapubic pain or mental status changes. Fevers, chills, nausea and or vomiting can also be signs of a possible UTI.  Positive urinalyses and positive urine cultures that are not associated with urinary symptoms should not be treated with antibiotics.    - I explained to the patient that being exposed to unnecessary antibiotics can put her at risk for increasing resistance of the bacteria to antibiotics, C. difficile and the side effects of the antibiotics.    - obtain a renal ultrasound to rule out a nidus for infections  2. Vaginal atrophy  - I explained to the patient that when women go through menopause and her estrogen levels are severely diminished, the normal vaginal flora will change.  This is due to an increase of the vaginal canal's pH. Because of this, the vaginal canal may be colonized by bacteria from the rectum instead of the protective lactobacillus.  This accompanied by the loss of the mucus barrier with vaginal atrophy is a cause of recurrent urinary tract infections.  - In some studies, the use of vaginal estrogen cream has been demonstrated to reduce  recurrent urinary tract infections to one a year.   - will hold vaginal estrogen cream until evaluated by gynecology for the vaginal polyp  3. Cystocele  - refer to gynecology for pessary fitting                                          4. Nocturia  -  I explained to the patient that nocturia is often multi-factorial and difficult to treat.  Sleeping disorders, heart conditions, peripheral vascular disease, diabetes, an enlarged prostate for men, an urethral stricture causing  bladder outlet obstruction and/or certain medications can contribute to nocturia.  - I have suggested that the patient avoid caffeine after noon and alcohol in the evening.  He or she may also benefit from fluid restrictions after 6:00 in the evening and voiding just prior to bedtime.  - I have explained that research studies have showed that over 84% of patients with sleep apnea reported frequent nighttime urination.   With sleep apnea, oxygen decreases, carbon dioxide increases, the blood become more acidic, the heart rate drops and blood vessels in the lung constrict.  The body is then alerted that something is very wrong. The sleeper must wake enough to reopen the airway. By this time, the heart is racing and experiences a false signal of fluid overload. The heart excretes a hormone-like protein that tells the body to get rid of sodium and water, resulting in nocturia.  -  I also informed the patient that a recent study noted that decreasing sodium intake to 2.3 grams daily, if they don't have issues with hyponatremia, can also reduce the number of nightly voids  - The patient may benefit from a discussion with his or her primary care physician to see if he or she has risk factors for sleep apnea or other sleep disturbances and obtaining a sleep study.  5. Mixed urinary incontinence  - not a candidate for anticholinergic therapy due to age and has failed it in the past  - not a candidate for Myrbetriq due to uncontrolled HTN  - patient deferred PT and PTNS at this time  6. Vaginal polyp  - refer to gynecology   Return for RUS report.  These notes generated with voice recognition software. I apologize for typographical errors.  Zara Council, [facility] [address] [place], [place] [address] [phone]

## 2017-01-05 ENCOUNTER — Ambulatory Visit (INDEPENDENT_AMBULATORY_CARE_PROVIDER_SITE_OTHER): Payer: Medicare Other | Admitting: Urology

## 2017-01-05 ENCOUNTER — Encounter: Payer: Self-pay | Admitting: Urology

## 2017-01-05 VITALS — BP 185/83 | HR 71 | Ht 65.0 in | Wt 169.8 lb

## 2017-01-05 DIAGNOSIS — N8111 Cystocele, midline: Secondary | ICD-10-CM

## 2017-01-05 DIAGNOSIS — R351 Nocturia: Secondary | ICD-10-CM

## 2017-01-05 DIAGNOSIS — N842 Polyp of vagina: Secondary | ICD-10-CM

## 2017-01-05 DIAGNOSIS — N3946 Mixed incontinence: Secondary | ICD-10-CM

## 2017-01-05 DIAGNOSIS — N390 Urinary tract infection, site not specified: Secondary | ICD-10-CM

## 2017-01-05 DIAGNOSIS — N952 Postmenopausal atrophic vaginitis: Secondary | ICD-10-CM

## 2017-01-05 LAB — BLADDER SCAN AMB NON-IMAGING: Scan Result: 79

## 2017-01-05 NOTE — Patient Instructions (Addendum)

## 2017-01-10 ENCOUNTER — Ambulatory Visit (INDEPENDENT_AMBULATORY_CARE_PROVIDER_SITE_OTHER): Payer: Medicare Other | Admitting: *Deleted

## 2017-01-10 VITALS — BP 157/84 | HR 71 | Ht 65.0 in | Wt 163.5 lb

## 2017-01-10 DIAGNOSIS — I481 Persistent atrial fibrillation: Secondary | ICD-10-CM | POA: Diagnosis not present

## 2017-01-10 DIAGNOSIS — I4819 Other persistent atrial fibrillation: Secondary | ICD-10-CM

## 2017-01-10 NOTE — Patient Instructions (Addendum)
Medication Instructions:  Your physician has recommended you make the following change in your medication:  1. If you continue to have nausea you can decrease Amiodarone to once daily.    Your physician has requested that you regularly monitor and record your blood pressure readings at home. Please use the same machine at the same time of day to check your readings and record them to bring to your follow-up visit.  Keep log and if blood pressures remain greater than 140/80 please give [place] a call with your readings.  Follow up appointment is 01/31/17 at 08:20AM with Dr. Rockey Situ  It was a pleasure seeing you today here in the office. Please do not hesitate to give [place] a call back if you have any further questions. [place], BSN

## 2017-01-10 NOTE — Progress Notes (Signed)
Spoke w/ pt's daughter, [name].  Advised her that Dr. Rockey Situ recommends she proceed w/ DCCV; offered her Tues or Thurs this week if pt would like.  Advised her that we will need to obtain pre-procedure labs; I can send her the hospital to get the results back ASAP. [name] states that pt is very hesitant to proceed w/ DCCV and would like to discuss w/ her further. Advised her to have pt remain on amiodarone - she reports that pt has been extremely nauseous since starting amiodarone to the point that she has not been able to keep anything down for over 5 days.  Pt was advised at nurse visit this am to decrease amio to once daily. She states that pt did not take this am and was able to keep her lunch down.  She will speak w/ her and call back if she would like to proceed w/ DCCV.

## 2017-01-10 NOTE — Progress Notes (Signed)
Patient in today to have EKG done which showed Afib. Patient reports that over the past week or so she has had progressive nausea since starting the amiodarone. Her and daughter had several questions at this visit. She had me check her blood pressure to verify her home cuff is similar and also wanted to verify medications and possible plan. Let her know that Dr. Rockey Situ would review her EKG and records and that someone would be in touch. Notified Dr. Rockey Situ about nausea and he stated that if it persists to have them go to once daily. Discussed this with both the patient and her daughter and they both verbalized understanding.

## 2017-01-11 ENCOUNTER — Encounter: Payer: Self-pay | Admitting: Cardiovascular Disease

## 2017-01-11 ENCOUNTER — Other Ambulatory Visit: Payer: Self-pay

## 2017-01-11 ENCOUNTER — Encounter: Payer: Self-pay | Admitting: Urology

## 2017-01-11 DIAGNOSIS — Z01812 Encounter for preprocedural laboratory examination: Secondary | ICD-10-CM

## 2017-01-11 DIAGNOSIS — I4892 Unspecified atrial flutter: Secondary | ICD-10-CM

## 2017-01-13 ENCOUNTER — Ambulatory Visit (INDEPENDENT_AMBULATORY_CARE_PROVIDER_SITE_OTHER): Payer: Medicare Other

## 2017-01-13 ENCOUNTER — Encounter: Payer: Self-pay | Admitting: Urology

## 2017-01-13 ENCOUNTER — Other Ambulatory Visit
Admission: RE | Admit: 2017-01-13 | Discharge: 2017-01-13 | Disposition: A | Discharge status: Home / Self Care | Payer: Medicare Other | Source: Ambulatory Visit | Attending: Cardiovascular Disease | Admitting: Cardiovascular Disease

## 2017-01-13 VITALS — BP 149/86 | HR 80 | Ht 65.0 in | Wt 165.4 lb

## 2017-01-13 DIAGNOSIS — I4892 Unspecified atrial flutter: Secondary | ICD-10-CM | POA: Insufficient documentation

## 2017-01-13 DIAGNOSIS — N390 Urinary tract infection, site not specified: Secondary | ICD-10-CM

## 2017-01-13 DIAGNOSIS — Z01812 Encounter for preprocedural laboratory examination: Secondary | ICD-10-CM

## 2017-01-13 LAB — URINALYSIS, COMPLETE
Bilirubin, UA: NEGATIVE
Glucose, UA: NEGATIVE
Leukocytes, UA: NEGATIVE
NITRITE UA: NEGATIVE
PH UA: 6 (ref 5.0–7.5)
Specific Gravity, UA: 1.02 (ref 1.005–1.030)
Urobilinogen, Ur: 0.2 mg/dL (ref 0.2–1.0)

## 2017-01-13 LAB — PROTIME-INR
INR: 1.38
PROTHROMBIN TIME: 17.1 s — AB (ref 11.4–15.2)

## 2017-01-13 LAB — BASIC METABOLIC PANEL
ANION GAP: 10 (ref 5–15)
BUN: 19 mg/dL (ref 6–20)
CHLORIDE: 103 mmol/L (ref 101–111)
CO2: 27 mmol/L (ref 22–32)
Calcium: 9.2 mg/dL (ref 8.9–10.3)
Creatinine, Ser: 1.06 mg/dL — ABNORMAL HIGH (ref 0.44–1.00)
GFR calc non Af Amer: 48 mL/min — ABNORMAL LOW (ref >60)
GFR, EST AFRICAN AMERICAN: 56 mL/min — AB (ref >60)
Glucose, Bld: 120 mg/dL — ABNORMAL HIGH (ref 65–99)
POTASSIUM: 3 mmol/L — AB (ref 3.5–5.1)
SODIUM: 140 mmol/L (ref 135–145)

## 2017-01-13 LAB — CBC WITH DIFFERENTIAL/PLATELET
Basophils Absolute: 0 10*3/uL (ref 0–0.1)
Basophils Relative: 0 %
Eosinophils Absolute: 0.1 10*3/uL (ref 0–0.7)
Eosinophils Relative: 2 %
HCT: 40.1 % (ref 35.0–47.0)
HEMOGLOBIN: 13.2 g/dL (ref 12.0–16.0)
LYMPHS ABS: 0.7 10*3/uL — AB (ref 1.0–3.6)
LYMPHS PCT: 9 %
MCH: 29.9 pg (ref 26.0–34.0)
MCHC: 32.8 g/dL (ref 32.0–36.0)
MCV: 91.1 fL (ref 80.0–100.0)
Monocytes Absolute: 0.5 10*3/uL (ref 0.2–0.9)
Monocytes Relative: 6 %
NEUTROS PCT: 83 %
Neutro Abs: 6.6 10*3/uL — ABNORMAL HIGH (ref 1.4–6.5)
Platelets: 296 10*3/uL (ref 150–440)
RBC: 4.4 MIL/uL (ref 3.80–5.20)
RDW: 15 % — ABNORMAL HIGH (ref 11.5–14.5)
WBC: 7.9 10*3/uL (ref 3.6–11.0)

## 2017-01-13 LAB — MICROSCOPIC EXAMINATION

## 2017-01-13 NOTE — Progress Notes (Signed)
Pt presents today with c/o hard to postpone urination, leakage of urine, chills and urinary frequency. Pt also c/o n/v but daughter stated pt just started a new medication for a-fib and a main side effect is n/v. Therefore daughter is not sure which is causing the discomfort. A CATH specimen was obtained for u/a and cx.    Blood pressure (!) 149/86, pulse 80, height 5\' 5"  (1.651 m), weight 165 lb 6.4 oz (75 kg).

## 2017-01-14 ENCOUNTER — Encounter: Payer: Self-pay | Admitting: Urology

## 2017-01-14 ENCOUNTER — Other Ambulatory Visit: Payer: Self-pay

## 2017-01-14 MED ORDER — POTASSIUM CHLORIDE CRYS ER 20 MEQ PO TBCR: 20.0000 meq | EXTENDED_RELEASE_TABLET | Freq: Every day | ORAL | 6 refills | Status: DC | PRN

## 2017-01-17 ENCOUNTER — Encounter: Payer: Self-pay | Admitting: Cardiovascular Disease

## 2017-01-17 ENCOUNTER — Encounter: Payer: Self-pay | Admitting: Urology

## 2017-01-17 LAB — CULTURE, URINE COMPREHENSIVE

## 2017-01-17 NOTE — Telephone Encounter (Signed)
Changed app and lm for her daughter to call me back to confirm her new app   Barbara Mooney

## 2017-01-18 ENCOUNTER — Telehealth: Payer: Self-pay

## 2017-01-18 ENCOUNTER — Encounter: Payer: Self-pay | Admitting: Cardiovascular Disease

## 2017-01-18 DIAGNOSIS — N390 Urinary tract infection, site not specified: Secondary | ICD-10-CM

## 2017-01-18 MED ORDER — CIPROFLOXACIN HCL 250 MG PO TABS: 250.0000 mg | ORAL_TABLET | Freq: Two times a day (BID) | ORAL | 0 refills | Status: DC

## 2017-01-18 NOTE — Telephone Encounter (Signed)
-----   Message from Nori Riis, PA-C sent at 01/17/2017  8:42 PM EST ----- Patient has a +UCx.  They need to start Cipro 250 mg, one tablet twice daily for seven days.  They also need to take a probiotic with the antibiotic course.  The dosage is listed below:  L. acidophilus and L. casei (25 x 109 CFU/day for 2 days, then 50 x 109 CFU/day for duration of the antibiotic course)

## 2017-01-18 NOTE — Telephone Encounter (Signed)
Message sent via mychart at daughter request.

## 2017-01-19 ENCOUNTER — Telehealth: Payer: Self-pay | Admitting: Cardiovascular Disease

## 2017-01-19 ENCOUNTER — Other Ambulatory Visit: Payer: Self-pay | Admitting: Cardiovascular Disease

## 2017-01-19 NOTE — Telephone Encounter (Signed)
Patient daughter had to change s/p cardioversion fu.  She wants to be sure that it is ok to change appointment and delay fu until 02/07/17.

## 2017-01-19 NOTE — Telephone Encounter (Signed)
Spoke with patients daughter per release form and she reports that her mother is currently on cipro for UTI and is scheduled tomorrow for cardioversion. Let her know this is fine and she also had to reschedule upcoming appointment. She was appreciative for the call back and had no further questions at this time.

## 2017-01-19 NOTE — Telephone Encounter (Signed)
Spoke with patients daughter and she states that her mother has not missed any doses of medication and they have no questions about procedure scheduled tomorrow. She verbalized understanding of all instructions and had no further questions at this time.

## 2017-01-19 NOTE — Telephone Encounter (Signed)
Left voicemail message to call back  

## 2017-01-19 NOTE — Telephone Encounter (Signed)
Patient daughter calling to make sure patient can still have procedure .  See note below.  Please call [phone] [name] .

## 2017-01-20 ENCOUNTER — Ambulatory Visit: Payer: Medicare Other | Admitting: Anesthesiology

## 2017-01-20 ENCOUNTER — Encounter
Admission: RE | Disposition: A | Discharge status: Home / Self Care | Payer: Self-pay | Source: Ambulatory Visit | Attending: Cardiovascular Disease

## 2017-01-20 ENCOUNTER — Ambulatory Visit: Payer: Medicare Other

## 2017-01-20 ENCOUNTER — Encounter: Payer: Self-pay | Admitting: Cardiovascular Disease

## 2017-01-20 ENCOUNTER — Ambulatory Visit
Admission: RE | Admit: 2017-01-20 | Discharge: 2017-01-20 | Disposition: A | Discharge status: Home / Self Care | Payer: Medicare Other | Source: Ambulatory Visit | Attending: Cardiovascular Disease | Admitting: Cardiovascular Disease

## 2017-01-20 DIAGNOSIS — Z7984 Long term (current) use of oral hypoglycemic drugs: Secondary | ICD-10-CM | POA: Insufficient documentation

## 2017-01-20 DIAGNOSIS — I10 Essential (primary) hypertension: Secondary | ICD-10-CM | POA: Diagnosis not present

## 2017-01-20 DIAGNOSIS — E119 Type 2 diabetes mellitus without complications: Secondary | ICD-10-CM | POA: Insufficient documentation

## 2017-01-20 DIAGNOSIS — K219 Gastro-esophageal reflux disease without esophagitis: Secondary | ICD-10-CM | POA: Diagnosis not present

## 2017-01-20 DIAGNOSIS — Z79899 Other long term (current) drug therapy: Secondary | ICD-10-CM | POA: Diagnosis not present

## 2017-01-20 DIAGNOSIS — I481 Persistent atrial fibrillation: Secondary | ICD-10-CM | POA: Insufficient documentation

## 2017-01-20 DIAGNOSIS — I480 Paroxysmal atrial fibrillation: Secondary | ICD-10-CM | POA: Insufficient documentation

## 2017-01-20 DIAGNOSIS — Z7901 Long term (current) use of anticoagulants: Secondary | ICD-10-CM | POA: Insufficient documentation

## 2017-01-20 HISTORY — PX: CARDIOVERSION: EP1203

## 2017-01-20 SURGERY — CARDIOVERSION (CATH LAB)
Anesthesia: General

## 2017-01-20 SURGERY — CARDIOVERSION (CATH LAB)
Anesthesia: General | Laterality: Bilateral

## 2017-01-20 MED ORDER — PROPOFOL 10 MG/ML IV BOLUS
INTRAVENOUS | Status: AC
  Filled 2017-01-20: qty 20

## 2017-01-20 MED ORDER — PHENYLEPHRINE HCL 10 MG/ML IJ SOLN
INTRAMUSCULAR | Status: AC
  Filled 2017-01-20: qty 1

## 2017-01-20 MED ORDER — PROPOFOL 10 MG/ML IV BOLUS
INTRAVENOUS | Status: DC | PRN
  Administered 2017-01-20: 75 mg via INTRAVENOUS

## 2017-01-20 MED ORDER — SODIUM CHLORIDE 0.9 % IV SOLN
INTRAVENOUS | Status: DC | PRN
  Administered 2017-01-20: 08:00:00 via INTRAVENOUS

## 2017-01-20 MED ORDER — ONDANSETRON HCL 4 MG/2ML IJ SOLN: 4.0000 mg | Freq: Once | INTRAMUSCULAR | Status: DC | PRN

## 2017-01-20 MED ORDER — FENTANYL CITRATE (PF) 100 MCG/2ML IJ SOLN
INTRAMUSCULAR | Status: AC
  Filled 2017-01-20: qty 2

## 2017-01-20 MED ORDER — FENTANYL CITRATE (PF) 100 MCG/2ML IJ SOLN: 25.0000 ug | INTRAMUSCULAR | Status: DC | PRN

## 2017-01-20 NOTE — CV Procedure (Signed)
Cardioversion procedure note For atrial fibrillation, persistent  Procedure Details:  Consent: Risks of procedure as well as the alternatives and risks of each were explained to the (patient/caregiver). Consent for procedure obtained.  Time Out: Verified patient identification, verified procedure, site/side was marked, verified correct patient position, special equipment/implants available, medications/allergies/relevent history reviewed, required imaging and test results available. Performed  Patient placed on cardiac monitor, pulse oximetry, supplemental oxygen as necessary.  Sedation given: propofol IV, Dr. [place] Service Pacer pads placed anterior and posterior chest.   Cardioverted 1 time(s).  Cardioverted at 150 J. Synchronized biphasic Converted to NSR   Evaluation: Findings: Post procedure EKG shows: NSR Complications: None Patient did tolerate procedure well.  She will hold her amiodarone for now given significant GI distress, which she attributes to the amiodarone medication. Stay on other medications including anticoagulation.  F/U in 10 days to 2 weeks in clinic  Time Spent Directly with the Patient: 25 minutes   Esmond Plants, M.D., Ph.D.

## 2017-01-20 NOTE — Progress Notes (Signed)
Patient post cardioversion to Sinus Rhythm, family at bedside. Vitals stable at this time. Alert and oriented. Dr Rockey Situ out to speak with family with questions answered. Noting to Dr Rockey Situ that even on lower dose of amiodarone, pt still cont's to be very nauseated requiring use of antiemetics., decision made to hold on amiodarone at this point and continue other meds. Denies complaints at this time. Pt already has appointment made prior to this procedure.

## 2017-01-20 NOTE — Anesthesia Post-op Follow-up Note (Cosign Needed)
Anesthesia QCDR form completed.        

## 2017-01-20 NOTE — Anesthesia Postprocedure Evaluation (Signed)
Anesthesia Post Note  Patient: Barbara Mooney  Procedure(s) Performed: Procedure(s) (LRB): CARDIOVERSION (N/A)  Patient location during evaluation: PACU Anesthesia Type: General Level of consciousness: awake Pain management: pain level controlled Vital Signs Assessment: post-procedure vital signs reviewed and stable Respiratory status: spontaneous breathing Cardiovascular status: stable Anesthetic complications: no     Last Vitals:  Vitals:   01/20/17 0827 01/20/17 0836  BP: (!) 151/64   Pulse: (!) 58 (!) 59  Resp: 16 15  Temp:      Last Pain:  Vitals:   01/20/17 0637  TempSrc: Oral                 VAN STAVEREN,[name]

## 2017-01-20 NOTE — Discharge Instructions (Signed)
Electrical Cardioversion, Care After °This sheet gives you information about how to care for yourself after your procedure. Your health care provider may also give you more specific instructions. If you have problems or questions, contact your health care provider. °What can I expect after the procedure? °After the procedure, it is common to have: °· Some redness on the skin where the shocks were given. °Follow these instructions at home: °· Do not drive for 24 hours if you were given a medicine to help you relax (sedative). °· Take over-the-counter and prescription medicines only as told by your health care provider. °· Ask your health care provider how to check your pulse. Check it often. °· Rest for 48 hours after the procedure or as told by your health care provider. °· Avoid or limit your caffeine use as told by your health care provider. °Contact a health care provider if: °· You feel like your heart is beating too quickly or your pulse is not regular. °· You have a serious muscle cramp that does not go away. °Get help right away if: °· You have discomfort in your chest. °· You are dizzy or you feel faint. °· You have trouble breathing or you are short of breath. °· Your speech is slurred. °· You have trouble moving an arm or leg on one side of your body. °· Your fingers or toes turn cold or blue. °This information is not intended to replace advice given to you by your health care provider. Make sure you discuss any questions you have with your health care provider. °Document Released: 09/19/2013 Document Revised: 07/02/2016 Document Reviewed: 06/04/2016 °Elsevier Interactive Patient Education © 2017 [employer] ° °

## 2017-01-20 NOTE — Anesthesia Preprocedure Evaluation (Signed)
Anesthesia Evaluation  Patient identified by MRN, date of birth, ID band Patient awake    Reviewed: Allergy & Precautions, NPO status , Patient's Chart, lab work & pertinent test results  Airway Mallampati: II       Dental  (+) Teeth Intact   Pulmonary neg pulmonary ROS,    breath sounds clear to auscultation       Cardiovascular Exercise Tolerance: Good hypertension, Pt. on medications + dysrhythmias Atrial Fibrillation  Rhythm:Irregular Rate:Abnormal     Neuro/Psych negative neurological ROS  negative psych ROS   GI/Hepatic Neg liver ROS, GERD  Medicated,  Endo/Other  diabetes, Type 2, Oral Hypoglycemic Agents  Renal/GU negative Renal ROS     Musculoskeletal   Abdominal Normal abdominal exam  (+)   Peds negative pediatric ROS (+)  Hematology  (+) anemia ,   Anesthesia Other Findings   Reproductive/Obstetrics                             Anesthesia Physical Anesthesia Plan  ASA: III  Anesthesia Plan: General   Post-op Pain Management:    Induction: Intravenous  Airway Management Planned: Natural Airway and Nasal Cannula  Additional Equipment:   Intra-op Plan:   Post-operative Plan:   Informed Consent: I have reviewed the patients History and Physical, chart, labs and discussed the procedure including the risks, benefits and alternatives for the proposed anesthesia with the patient or authorized representative who has indicated his/her understanding and acceptance.     Plan Discussed with: CRNA and Surgeon  Anesthesia Plan Comments:         Anesthesia Quick Evaluation

## 2017-01-20 NOTE — Transfer of Care (Signed)
Immediate Anesthesia Transfer of Care Note  Patient: [place] B Saner  Procedure(s) Performed: Procedure(s): CARDIOVERSION (N/A)  Patient Location: PACU  Anesthesia Type:General  Level of Consciousness: awake and patient cooperative  Airway & Oxygen Therapy: Patient Spontanous Breathing and Patient connected to nasal cannula oxygen  Post-op Assessment: Report given to RN and Post -op Vital signs reviewed and stable  Post vital signs: Reviewed and stable  Last Vitals:  Vitals:   01/20/17 0805 01/20/17 0806  BP: 136/61   Pulse: (!) 58 (!) 58  Resp: 16 15  Temp:      Last Pain:  Vitals:   01/20/17 0637  TempSrc: Oral         Complications: No apparent anesthesia complications

## 2017-01-25 ENCOUNTER — Ambulatory Visit: Payer: Medicare Other | Admitting: Urology

## 2017-01-31 ENCOUNTER — Ambulatory Visit: Payer: Self-pay | Admitting: Cardiovascular Disease

## 2017-02-07 ENCOUNTER — Ambulatory Visit: Payer: Self-pay | Admitting: Cardiovascular Disease

## 2017-02-07 ENCOUNTER — Ambulatory Visit
Admission: RE | Admit: 2017-02-07 | Discharge: 2017-02-07 | Disposition: A | Discharge status: Home / Self Care | Payer: Medicare Other | Source: Ambulatory Visit | Attending: Urology | Admitting: Urology

## 2017-02-07 DIAGNOSIS — N281 Cyst of kidney, acquired: Secondary | ICD-10-CM | POA: Diagnosis not present

## 2017-02-07 DIAGNOSIS — N390 Urinary tract infection, site not specified: Secondary | ICD-10-CM | POA: Diagnosis not present

## 2017-02-08 ENCOUNTER — Encounter: Payer: Self-pay | Admitting: Cardiovascular Disease

## 2017-02-08 ENCOUNTER — Ambulatory Visit (INDEPENDENT_AMBULATORY_CARE_PROVIDER_SITE_OTHER): Payer: Medicare Other | Admitting: Cardiovascular Disease

## 2017-02-08 VITALS — BP 160/80 | HR 67 | Ht 65.0 in | Wt 166.8 lb

## 2017-02-08 DIAGNOSIS — I10 Essential (primary) hypertension: Secondary | ICD-10-CM

## 2017-02-08 DIAGNOSIS — R2681 Unsteadiness on feet: Secondary | ICD-10-CM | POA: Diagnosis not present

## 2017-02-08 DIAGNOSIS — M7989 Other specified soft tissue disorders: Secondary | ICD-10-CM | POA: Diagnosis not present

## 2017-02-08 DIAGNOSIS — I480 Paroxysmal atrial fibrillation: Secondary | ICD-10-CM | POA: Diagnosis not present

## 2017-02-08 DIAGNOSIS — E876 Hypokalemia: Secondary | ICD-10-CM

## 2017-02-08 DIAGNOSIS — Z7189 Other specified counseling: Secondary | ICD-10-CM | POA: Diagnosis not present

## 2017-02-08 NOTE — Patient Instructions (Addendum)
Medication Instructions:   Please monitor blood pressures  If blood pressure runs high, call the office  If leg swelling gets worse, call the office We could decrease the dose of the cardizem  Please stop the potassium Take only with lasix as needed for leg swelling  Labwork:  BMP in one month  Testing/Procedures:  No further testing at this time   I recommend watching educational videos on topics of interest to you at:       [email or web address]  Enter code: [email or web address]    Follow-Up: It was a pleasure seeing you in the office today. Please call us if you have new issues that need to be addressed before your next appt.  [phone]  Your physician wants you to follow-up in: 6 months.  You will receive a reminder letter in the mail two months in advance. If you don't receive a letter, please call our office to schedule the follow-up appointment.  If you need a refill on your cardiac medications before your next appointment, please call your pharmacy.

## 2017-02-08 NOTE — Progress Notes (Signed)
Cardiology Office Note  Date:  02/08/2017   ID:  Barbara Mooney, DOB [birth year], MRN [id number]  PCP:  Marinda Elk, MD   Chief Complaint  Patient presents with  . other    6wk f/u. PT c/o swelling in feet/legs. Reviewed meds with pt verbally.    HPI:  [name] is an 73F with hypertension, diabetes and GERD history of sepsis November 2017, noted to be in atrial fibrillation with RVR, Who presents for routine follow-up of her persistent atrial fibrillation  On her last clinic visit we started amiodarone in an effort to restore normal sinus rhythm Amiodarone caused nausea, after one or 2 weeks she stopped the medication Converted to NSR somewhere since her last visit, she was unaware that she had converted to normal sinus rhythm but did appreciate a more regular pulse  No significant change in energy, breathing   She has had slight worsening of her leg swelling left leg more swollen than the right  Typically has mild swelling, perhaps worse recently   in the hospital Cardizem dose was increased from 240 up to 360 mg rate control She does not like taking Lasix, feels it washes her out, and causes leg swelling  She has not been checking her blood pressure at home Blood pressure markedly elevated on arrival today [id number] systolic On recheck 0000000 systolic done by myself  EKG on today's visit reviewed by myself showing normal sinus rhythm with right bundle branch block rate 67 bpm  Other past medical history reviewed  ED 10/28/16 with generalized weakness, fatigue, fever and chills.  found to have sepsis secondary to a urinary source   hypotensive , atrial fibrillation  Echocardiogram done in the hospital showedconcentric hypertrophy. Systolic function was normal. Theestimated ejection fraction was in the range of 55% to 60%.   She was asymptomatic from her atrial fibrillation  Metoprolol and Cardizem added for rate control  Currently taking Eliquis 5mg  bid.    This  patients CHA2DS2-VASc Score and unadjusted Ischemic Stroke Rate (% per year) is equal to 7.2 % stroke rate/year from a score of 5  Above score calculated as 1 point each if present [CHF, HTN, DM,Vascular=MI/PAD/Aortic Plaque, Age if 76-74, or Female] Above score calculated as 2 points each if present [Age >75, or Stroke/TIA/TE]   PMH:   has a past medical history of Arthritis; Bruises easily; Diabetes mellitus; GERD (gastroesophageal reflux disease); Hypertension; and Multiple thyroid nodules.  PSH:    Past Surgical History:  Procedure Laterality Date  . CARDIOVERSION N/A 01/20/2017   Procedure: CARDIOVERSION;  Surgeon: Minna Merritts, MD;  Location: [facility] ORS;  Service: Cardiovascular;  Laterality: N/A;    Current Outpatient Prescriptions  Medication Sig Dispense Refill  . apixaban (ELIQUIS) 5 MG TABS tablet Take 1 tablet (5 mg total) by mouth 2 (two) times daily. 60 tablet 2  . ciprofloxacin (CIPRO) 250 MG tablet Take 1 tablet (250 mg total) by mouth 2 (two) times daily. 14 tablet 0  . CRANBERRY PO Take 2 tablets by mouth daily.    Marland Kitchen diltiazem (CARDIZEM CD) 360 MG 24 hr capsule Take 1 capsule (360 mg total) by mouth daily. 30 capsule 1  . FERROUS SULFATE PO Take 1 tablet by mouth daily.    . furosemide (LASIX) 20 MG tablet Take 1 tablet (20 mg total) by mouth daily as needed for fluid or edema (SOB). 30 tablet 5  . metFORMIN (GLUCOPHAGE) 500 MG tablet Take 500 mg by mouth 2 (two) times  daily with a meal.    . metoprolol succinate (TOPROL-XL) 100 MG 24 hr tablet Take 1 tablet (100 mg total) by mouth 2 (two) times daily. Take with or immediately following a meal. 60 tablet 1  . naproxen sodium (ANAPROX) 220 MG tablet Take 220-440 mg by mouth 2 (two) times daily as needed (pain).    Marland Kitchen omeprazole (PRILOSEC) 20 MG capsule Take 1 capsule (20 mg total) by mouth daily. 30 capsule 6  . ondansetron (ZOFRAN ODT) 4 MG disintegrating tablet Take 1 tablet (4 mg total) by mouth every 8 (eight) hours  as needed for nausea or vomiting. 20 tablet 0  . potassium chloride SA (KLOR-CON M20) 20 MEQ tablet Take 1 tablet (20 mEq total) by mouth daily as needed (take with lasix). (Patient taking differently: Take 20 mEq by mouth daily. ) 30 tablet 6   No current facility-administered medications for this visit.      Allergies:   Patient has no known allergies.   Social History:  The patient  reports that she has never smoked. She has never used smokeless tobacco. She reports that she does not drink alcohol or use drugs.   Family History:   family history includes Breast cancer (age of onset: 65) in her sister; Ovarian cancer in her maternal aunt.    Review of Systems: Review of Systems  Constitutional: Positive for malaise/fatigue.  Respiratory: Negative.   Cardiovascular: Negative.   Gastrointestinal: Negative.   Musculoskeletal: Negative.   Neurological: Positive for weakness.  Psychiatric/Behavioral: Negative.   All other systems reviewed and are negative.    PHYSICAL EXAM: VS:  BP (!) 160/80 (BP Location: Left Arm, Patient Position: Sitting, Cuff Size: Normal)   Pulse 67   Ht 5\' 5"  (1.651 m)   Wt 166 lb 12 oz (75.6 kg)   BMI 27.75 kg/m  , BMI Body mass index is 27.75 kg/m. Repeat blood pressure 160/80 GEN: Well nourished, well developed, in no acute distress  HEENT: normal  Neck: no JVD, carotid bruits, or masses Cardiac: RRR; no murmurs, rubs, or gallops,no edema  Respiratory:  clear to auscultation bilaterally, normal work of breathing GI: soft, nontender, nondistended, + BS MS: no deformity or atrophy  Skin: warm and dry, no rash Neuro:  Strength and sensation are intact Psych: euthymic mood, full affect    Recent Labs: 10/28/2016: ALT 14 11/01/2016: TSH 2.340 01/13/2017: BUN 19; Creatinine, Ser 1.06; Hemoglobin 13.2; Platelets 296; Potassium 3.0; Sodium 140    Lipid Panel No results found for: CHOL, HDL, LDLCALC, TRIG    Wt Readings from Last 3 Encounters:   02/08/17 166 lb 12 oz (75.6 kg)  01/20/17 165 lb (74.8 kg)  01/13/17 165 lb 6.4 oz (75 kg)       ASSESSMENT AND PLAN:  Paroxysmal atrial fibrillation (HCC) - Plan: EKG 12-Lead Using amiodarone and diltiazem metoprolol normal sinus rhythm has been restored Unable to tolerate amiodarone secondary to nausea Slight worsening of her leg swelling likely from Cardizem Recommended she stay on anticoagulation  Essential hypertension - Plan: EKG 12-Lead Blood pressure markedly elevated on today's visit, discussed with her, recheck mildly improved. Prior blood pressure on previous visits was well controlled. No medication changes made at this time, recommended that she monitor blood pressure at home and call our office if blood pressure numbers run high  Leg swelling She does not want to take Lasix, reports that it makes her feel bad, causes leg swelling. For some reason she has been taking potassium  daily. Recommended that she stop the potassium, only take the potassium when she takes Lasix. We will recheck BMP in 1 week Recommended compression hose If no improvement of leg swelling we may need to decrease the dose of her Cardizem  Unsteady gait Recommended she start to get out and restart her exercise program  Hypokalemia Low potassium 3 weeks ago likely from taking Lasix with no potassium She does not like to take Lasix. We will stop the potassium, recheck BMP in one month to evaluate potassium level  Long discussion with patient and daughter concerning issues above Extra time spent answering questions  Total encounter time more than 45 minutes  Greater than 50% was spent in counseling and coordination of care with the patient   Disposition:   F/U  6 months   Orders Placed This Encounter  Procedures  . EKG 12-Lead     Signed, Esmond Plants, M.D., Ph.D. 02/08/2017  [place], [place]

## 2017-02-08 NOTE — Addendum Note (Signed)
Addended by: Stana Bunting on: 02/08/2017 08:45 AM   Modules accepted: Orders

## 2017-02-09 ENCOUNTER — Encounter: Payer: Self-pay | Admitting: Urology

## 2017-02-09 ENCOUNTER — Ambulatory Visit (INDEPENDENT_AMBULATORY_CARE_PROVIDER_SITE_OTHER): Payer: Medicare Other | Admitting: Urology

## 2017-02-09 VITALS — BP 144/78 | HR 70 | Ht 65.0 in | Wt 167.4 lb

## 2017-02-09 DIAGNOSIS — N8111 Cystocele, midline: Secondary | ICD-10-CM

## 2017-02-09 DIAGNOSIS — N3946 Mixed incontinence: Secondary | ICD-10-CM | POA: Diagnosis not present

## 2017-02-09 DIAGNOSIS — N842 Polyp of vagina: Secondary | ICD-10-CM | POA: Diagnosis not present

## 2017-02-09 DIAGNOSIS — N952 Postmenopausal atrophic vaginitis: Secondary | ICD-10-CM

## 2017-02-09 DIAGNOSIS — N390 Urinary tract infection, site not specified: Secondary | ICD-10-CM | POA: Diagnosis not present

## 2017-02-09 DIAGNOSIS — R351 Nocturia: Secondary | ICD-10-CM | POA: Diagnosis not present

## 2017-02-09 NOTE — Progress Notes (Signed)
02/09/2017 2:10 PM   [place] 1934-04-23 [id number]  Referring provider: Marinda Elk, MD [facility] [place], [place] [address]  Chief Complaint  Patient presents with  . Results    RUS    HPI: 83 yo WF who presents today to discuss her RUS results that was performed to evaluate for a possible nidus for her recurrent UTI's.  Background history Patient is a 35 -year-old Caucasian female who is referred to [place] by, Barbara Elk, MD, for recurrent urinary tract infections who presents with her daughter, Barbara Mooney.  Patient states that she has had five urinary tract infections over the last year.  Her symptoms with a urinary tract infection consist of flu like symptoms, dysuria, chills, lower back pain and malodorous urine.   She denies gross hematuria, suprapubic pain, back pain, abdominal pain or flank pain.   She has not had any recent fevers, chills, nausea or vomiting.  She does not have a history of nephrolithiasis, GU surgery or GU trauma.   Reviewing her records,  she has had five documented positive urine cultures.    06/16/2016 Klebsiella - variable resistant pattern  10/06/2016 E. Coli - variable resistant pattern  10/28/2016 E. Coli - variable resistant pattern (admitted for sepsis)  11/10/2016 Enterococcus - variable resistant pattern  11/22/2016 E. Coli - variable resistant pattern  She is not sexually active.  She is post menopausal.  She denies constipation and/or diarrhea.  She does engage in good perineal hygiene. She does not take tub baths.  She has not had any recent imaging studies.  She is not drinking enough of water daily.  One cup of coffee daily.    RUS performed on 02/07/2017 noted left kidney is somewhat smaller than right kidney and shows evidence of prior scarring.  There is a cyst arising from the lower pole the right kidney.  No obstructing focus on either side. Renal cortical thickness overall is normal, and  renal echogenicity bilaterally is within normal limits.  I have independently reviewed the films.    Today, she is complaining of nocturia x 3 -4.  She does not wear incontinent pads regularly.  She has not seen the gynecologist as of this appointment.   She did have an UTI with enterococcus since she was last seen.     PMH: Past Medical History:  Diagnosis Date  . Arthritis   . Bruises easily   . Diabetes mellitus   . GERD (gastroesophageal reflux disease)   . Hypertension   . Multiple thyroid nodules     Surgical History: Past Surgical History:  Procedure Laterality Date  . CARDIOVERSION N/A 01/20/2017   Procedure: CARDIOVERSION;  Surgeon: Minna Merritts, MD;  Location: [facility] ORS;  Service: Cardiovascular;  Laterality: N/A;    Home Medications:  Allergies as of 02/09/2017   No Known Allergies     Medication List       Accurate as of 02/09/17  2:10 PM. Always use your most recent med list.          apixaban 5 MG Tabs tablet Commonly known as:  ELIQUIS Take 1 tablet (5 mg total) by mouth 2 (two) times daily.   ciprofloxacin 250 MG tablet Commonly known as:  CIPRO Take 1 tablet (250 mg total) by mouth 2 (two) times daily.   CRANBERRY PO Take 2 tablets by mouth daily.   cyanocobalamin 1000 MCG/ML injection Commonly known as:  (VITAMIN B-12) Inject into the  muscle.   diltiazem 360 MG 24 hr capsule Commonly known as:  CARDIZEM CD Take 1 capsule (360 mg total) by mouth daily.   FERROUS SULFATE PO Take 1 tablet by mouth daily.   furosemide 20 MG tablet Commonly known as:  LASIX Take 1 tablet (20 mg total) by mouth daily as needed for fluid or edema (SOB).   metFORMIN 500 MG tablet Commonly known as:  GLUCOPHAGE Take 500 mg by mouth 2 (two) times daily with a meal.   metoprolol succinate 100 MG 24 hr tablet Commonly known as:  TOPROL-XL Take 1 tablet (100 mg total) by mouth 2 (two) times daily. Take with or immediately following a meal.   naproxen sodium  220 MG tablet Commonly known as:  ANAPROX Take 220-440 mg by mouth 2 (two) times daily as needed (pain).   omeprazole 20 MG capsule Commonly known as:  PRILOSEC Take 1 capsule (20 mg total) by mouth daily.   ondansetron 4 MG disintegrating tablet Commonly known as:  ZOFRAN ODT Take 1 tablet (4 mg total) by mouth every 8 (eight) hours as needed for nausea or vomiting.   potassium chloride SA 20 MEQ tablet Commonly known as:  KLOR-CON M20 Take 1 tablet (20 mEq total) by mouth daily as needed (take with lasix).       Allergies: No Known Allergies  Family History: Family History  Problem Relation Age of Onset  . Breast cancer Sister 37  . Ovarian cancer Maternal Aunt   . Kidney cancer Neg Hx   . Bladder Cancer Neg Hx     Social History:  reports that she has never smoked. She has never used smokeless tobacco. She reports that she does not drink alcohol or use drugs.  ROS: UROLOGY Frequent Urination?: No Hard to postpone urination?: No Burning/pain with urination?: No Get up at night to urinate?: Yes Leakage of urine?: No Urine stream starts and stops?: No Trouble starting stream?: No Do you have to strain to urinate?: No Blood in urine?: No Urinary tract infection?: No Sexually transmitted disease?: No Injury to kidneys or bladder?: No Painful intercourse?: No Weak stream?: No Currently pregnant?: No Vaginal bleeding?: No Last menstrual period?: n  Gastrointestinal Nausea?: No Vomiting?: No Indigestion/heartburn?: No Diarrhea?: No Constipation?: No  Constitutional Fever: No Night sweats?: No Weight loss?: No Fatigue?: No  Skin Skin rash/lesions?: No Itching?: No  Eyes Blurred vision?: No Double vision?: No  Ears/Nose/Throat Sore throat?: No Sinus problems?: No  Hematologic/Lymphatic Swollen glands?: No Easy bruising?: No  Cardiovascular Leg swelling?: No Chest pain?: No  Respiratory Cough?: No Shortness of breath?:  No  Endocrine Excessive thirst?: No  Musculoskeletal Back pain?: No Joint pain?: No  Neurological Headaches?: No Dizziness?: No  Psychologic Depression?: No Anxiety?: No  Physical Exam: BP (!) 144/78   Pulse 70   Ht 5\' 5"  (1.651 m)   Wt 167 lb 6.4 oz (75.9 kg)   BMI 27.86 kg/m   Constitutional: Well nourished. Alert and oriented, No acute distress. HEENT: [place] AT, moist mucus membranes. Trachea midline, no masses. Cardiovascular: No clubbing, cyanosis, or edema. Respiratory: Normal respiratory effort, no increased work of breathing. Skin: No rashes, bruises or suspicious lesions. Lymph: No cervical or inguinal adenopathy. Neurologic: Grossly intact, no focal deficits, moving all 4 extremities. Psychiatric: Normal mood and affect.  Laboratory Data: Lab Results  Component Value Date   WBC 7.9 01/13/2017   HGB 13.2 01/13/2017   HCT 40.1 01/13/2017   MCV 91.1 01/13/2017  PLT 296 01/13/2017    Lab Results  Component Value Date   CREATININE 1.06 (H) 01/13/2017     Lab Results  Component Value Date   TSH 2.340 11/01/2016     Lab Results  Component Value Date   AST 18 10/28/2016   Lab Results  Component Value Date   ALT 14 10/28/2016    Pertinent Imaging: Results for Barbara, Mooney (MRN [id number]) as of 01/05/2017 09:06  Ref. Range 01/05/2017 08:52  Scan Result Unknown 79   CLINICAL DATA:  Recurrent urinary tract infections  EXAM: RENAL / URINARY TRACT ULTRASOUND COMPLETE  COMPARISON:  None.  FINDINGS: Right Kidney:  Length: 10.4 cm. Echogenicity and renal cortical thickness are within normal limits. No perinephric fluid or hydronephrosis visualized. There is a cyst arising from the lower pole of the right kidney measuring 1.9 x 1.9 x 1.4 cm. No sonographically demonstrable calculus or ureterectasis.  Left Kidney:  Length: 8.8 cm. Echogenicity and renal cortical thickness are within normal limits. There is scarring in the upper  pole left kidney. No mass, perinephric fluid, or hydronephrosis visualized. No sonographically demonstrable calculus or ureterectasis.  Bladder:  Appears normal for degree of bladder distention.  IMPRESSION: Left kidney is somewhat smaller than right kidney and shows evidence of prior scarring.  There is a cyst arising from the lower pole the right kidney.  No obstructing focus on either side. Renal cortical thickness overall is normal, and renal echogenicity bilaterally is within normal limits.   Electronically Signed   By: Lowella Grip III M.D.   On: 02/07/2017 10:31  Assessment & Plan:    1. Recurrent UTI"s  - RUS was negative  - patient has attempted to increase her water intake  - she is eating yogurt and fermented vegatables  - she is taking cranberry tablets  - she is not taking Vitamin C 1000 mg  - reminded patient to come in for CATH UA for symptoms of an UTI   2. Vaginal atrophy  - I explained to the patient that when women go through menopause and her estrogen levels are severely diminished, the normal vaginal flora will change.  This is due to an increase of the vaginal canal's pH. Because of this, the vaginal canal may be colonized by bacteria from the rectum instead of the protective lactobacillus.  This accompanied by the loss of the mucus barrier with vaginal atrophy is a cause of recurrent urinary tract infections.  - In some studies, the use of vaginal estrogen cream has been demonstrated to reduce  recurrent urinary tract infections to one a year.   - will hold vaginal estrogen cream until evaluated by gynecology for the vaginal polyp  - encouraged the patient to see gynecology  3. Cystocele  - refer to gynecology for pessary fitting               - encouraged the patient to see gynecology                            4. Nocturia  - Reviewed with the patient that nocturia is often multi-factorial and difficult to treat.  Sleeping disorders,  heart conditions, peripheral vascular disease, diabetes, an enlarged prostate for men, an urethral stricture causing bladder outlet obstruction and/or certain medications can contribute to nocturia.  - I have suggested that the patient avoid caffeine after noon and alcohol in the evening.  He or she may also benefit  from fluid restrictions after 6:00 in the evening and voiding just prior to bedtime.  - I have explained that research studies have showed that over 84% of patients with sleep apnea reported frequent nighttime urination.   With sleep apnea, oxygen decreases, carbon dioxide increases, the blood become more acidic, the heart rate drops and blood vessels in the lung constrict.  The body is then alerted that something is very wrong. The sleeper must wake enough to reopen the airway. By this time, the heart is racing and experiences a false signal of fluid overload. The heart excretes a hormone-like protein that tells the body to get rid of sodium and water, resulting in nocturia.  -  I also informed the patient that a recent study noted that decreasing sodium intake to 2.3 grams daily, if they don't have issues with hyponatremia, can also reduce the number of nightly voids  - The patient may benefit from a discussion with his or her primary care physician to see if he or she has risk factors for sleep apnea or other sleep disturbances and obtaining a sleep study.  5. Mixed urinary incontinence  - not a candidate for anticholinergic therapy due to age and has failed it in the past  - not a candidate for Myrbetriq due to uncontrolled HTN  - patient still deferred PT and PTNS at this time  6. Vaginal polyp  - refer to gynecology   Return in about 3 months (around 05/09/2017) for PVR and OAB questionnaire.  These notes generated with voice recognition software. I apologize for typographical errors.  Barbara Mooney, [facility] [address], [place], [place] [address] [phone]

## 2017-02-15 ENCOUNTER — Ambulatory Visit (INDEPENDENT_AMBULATORY_CARE_PROVIDER_SITE_OTHER): Payer: Medicare Other

## 2017-02-15 ENCOUNTER — Encounter: Payer: Self-pay | Admitting: Urology

## 2017-02-15 VITALS — BP 136/70 | HR 69 | Ht 65.0 in | Wt 170.0 lb

## 2017-02-15 DIAGNOSIS — N390 Urinary tract infection, site not specified: Secondary | ICD-10-CM

## 2017-02-15 NOTE — Progress Notes (Signed)
Pt presents today with urinary frequency and urgency, hard to postpone urination, and leakage of urine.  Pt has been on abx in the last 30 days for a UTI. A CATH specimen was obtained for u/a and cx.   Blood pressure 136/70, pulse 69, height 5\' 5"  (1.651 m), weight 170 lb (77.1 kg).

## 2017-02-16 ENCOUNTER — Encounter: Payer: Medicare Other | Admitting: Obstetrics and Gynecology

## 2017-02-16 LAB — URINALYSIS, COMPLETE
Bilirubin, UA: NEGATIVE
Glucose, UA: NEGATIVE
Leukocytes, UA: NEGATIVE
NITRITE UA: NEGATIVE
RBC, UA: NEGATIVE
Specific Gravity, UA: 1.02 (ref 1.005–1.030)
UUROB: 0.2 mg/dL (ref 0.2–1.0)
pH, UA: 5.5 (ref 5.0–7.5)

## 2017-02-16 LAB — MICROSCOPIC EXAMINATION

## 2017-02-21 ENCOUNTER — Other Ambulatory Visit: Payer: Self-pay | Admitting: Urology

## 2017-02-21 LAB — CULTURE, URINE COMPREHENSIVE

## 2017-02-21 MED ORDER — NITROFURANTOIN MONOHYD MACRO 100 MG PO CAPS: 100.0000 mg | ORAL_CAPSULE | Freq: Two times a day (BID) | ORAL | 0 refills | Status: DC

## 2017-02-22 ENCOUNTER — Telehealth: Payer: Self-pay

## 2017-02-22 NOTE — Telephone Encounter (Signed)
Spoke with pt in reference to +ucx. Made aware abx were called into pharmacy. Pt voiced understanding.  Pt voiced frustration and concern about "having a UTI every other week." Pt stated "I need help and I need help now." Made pt aware can make an appt to speak with [facility]. Pt stated that she would call back to make appt when daughter is present.

## 2017-02-22 NOTE — Telephone Encounter (Signed)
-----   Message from Nori Riis, PA-C sent at 02/21/2017  2:43 PM EDT ----- Patient has a +UCx.  They need to start Macrobid,  one capsule twice daily for seven days.  They also need to take a probiotic with the antibiotic course.     I have e scribed their prescription to [facility] on [address]

## 2017-02-28 ENCOUNTER — Encounter: Payer: Self-pay | Admitting: Cardiovascular Disease

## 2017-03-05 ENCOUNTER — Other Ambulatory Visit: Payer: Self-pay | Admitting: Physician Assistant

## 2017-03-08 ENCOUNTER — Other Ambulatory Visit: Payer: Medicare Other

## 2017-03-14 ENCOUNTER — Other Ambulatory Visit (INDEPENDENT_AMBULATORY_CARE_PROVIDER_SITE_OTHER): Payer: Medicare Other

## 2017-03-14 DIAGNOSIS — I480 Paroxysmal atrial fibrillation: Secondary | ICD-10-CM | POA: Diagnosis not present

## 2017-03-14 DIAGNOSIS — R2681 Unsteadiness on feet: Secondary | ICD-10-CM

## 2017-03-14 DIAGNOSIS — I10 Essential (primary) hypertension: Secondary | ICD-10-CM

## 2017-03-14 DIAGNOSIS — M7989 Other specified soft tissue disorders: Secondary | ICD-10-CM

## 2017-03-14 DIAGNOSIS — E876 Hypokalemia: Secondary | ICD-10-CM

## 2017-03-14 DIAGNOSIS — Z7189 Other specified counseling: Secondary | ICD-10-CM

## 2017-03-15 LAB — BASIC METABOLIC PANEL
BUN / CREAT RATIO: 19 (ref 12–28)
BUN: 16 mg/dL (ref 8–27)
CHLORIDE: 98 mmol/L (ref 96–106)
CO2: 28 mmol/L (ref 18–29)
Calcium: 9.3 mg/dL (ref 8.7–10.3)
Creatinine, Ser: 0.84 mg/dL (ref 0.57–1.00)
GFR calc Af Amer: 76 mL/min/{1.73_m2} (ref >59)
GFR calc non Af Amer: 66 mL/min/{1.73_m2} (ref >59)
GLUCOSE: 158 mg/dL — AB (ref 65–99)
POTASSIUM: 3.8 mmol/L (ref 3.5–5.2)
SODIUM: 142 mmol/L (ref 134–144)

## 2017-04-04 ENCOUNTER — Encounter: Payer: Self-pay | Admitting: Urology

## 2017-04-05 ENCOUNTER — Ambulatory Visit (INDEPENDENT_AMBULATORY_CARE_PROVIDER_SITE_OTHER): Payer: Medicare Other

## 2017-04-05 VITALS — BP 163/75 | HR 70 | Ht 65.0 in | Wt 166.5 lb

## 2017-04-05 DIAGNOSIS — R3915 Urgency of urination: Secondary | ICD-10-CM | POA: Diagnosis not present

## 2017-04-05 LAB — URINALYSIS, COMPLETE
BILIRUBIN UA: NEGATIVE
Glucose, UA: NEGATIVE
Nitrite, UA: NEGATIVE
PH UA: 6 (ref 5.0–7.5)
SPEC GRAV UA: 1.015 (ref 1.005–1.030)
Urobilinogen, Ur: 0.2 mg/dL (ref 0.2–1.0)

## 2017-04-05 LAB — MICROSCOPIC EXAMINATION
RBC, UA: NONE SEEN /hpf (ref 0–?)
WBC, UA: >30 /hpf — ABNORMAL HIGH (ref 0–?)

## 2017-04-05 NOTE — Progress Notes (Signed)
Pt presents today with c/o urinary urgency. A CATH specimen was obtained for u/a and cx.   Blood pressure (!) 163/75, pulse 70, height 5\' 5"  (1.651 m), weight 166 lb 8 oz (75.5 kg).

## 2017-04-06 ENCOUNTER — Ambulatory Visit: Payer: Self-pay

## 2017-04-08 ENCOUNTER — Other Ambulatory Visit: Payer: Self-pay | Admitting: Urology

## 2017-04-08 ENCOUNTER — Encounter: Payer: Self-pay | Admitting: Urology

## 2017-04-08 ENCOUNTER — Telehealth: Payer: Self-pay | Admitting: Urology

## 2017-04-08 LAB — CULTURE, URINE COMPREHENSIVE

## 2017-04-08 MED ORDER — AMOXICILLIN-POT CLAVULANATE 875-125 MG PO TABS: 1.0000 | ORAL_TABLET | Freq: Two times a day (BID) | ORAL | 0 refills | Status: DC

## 2017-04-15 ENCOUNTER — Other Ambulatory Visit: Payer: Self-pay | Admitting: Student

## 2017-04-15 DIAGNOSIS — M75101 Unspecified rotator cuff tear or rupture of right shoulder, not specified as traumatic: Secondary | ICD-10-CM

## 2017-05-02 ENCOUNTER — Ambulatory Visit (HOSPITAL_COMMUNITY)
Admission: RE | Admit: 2017-05-02 | Discharge: 2017-05-02 | Disposition: A | Discharge status: Home / Self Care | Payer: Medicare Other | Source: Ambulatory Visit | Attending: Student | Admitting: Student

## 2017-05-02 DIAGNOSIS — M12811 Other specific arthropathies, not elsewhere classified, right shoulder: Secondary | ICD-10-CM | POA: Diagnosis not present

## 2017-05-02 DIAGNOSIS — M75101 Unspecified rotator cuff tear or rupture of right shoulder, not specified as traumatic: Secondary | ICD-10-CM

## 2017-05-10 NOTE — Telephone Encounter (Signed)
Error

## 2017-05-11 ENCOUNTER — Ambulatory Visit: Payer: Medicare Other | Admitting: Urology

## 2017-05-16 DIAGNOSIS — M7581 Other shoulder lesions, right shoulder: Secondary | ICD-10-CM | POA: Insufficient documentation

## 2017-05-16 DIAGNOSIS — M75121 Complete rotator cuff tear or rupture of right shoulder, not specified as traumatic: Secondary | ICD-10-CM | POA: Insufficient documentation

## 2017-05-17 ENCOUNTER — Ambulatory Visit
Admission: RE | Admit: 2017-05-17 | Discharge: 2017-05-17 | Disposition: A | Discharge status: Home / Self Care | Payer: Medicare Other | Source: Ambulatory Visit | Attending: Physician Assistant | Admitting: Physician Assistant

## 2017-05-17 DIAGNOSIS — Z1231 Encounter for screening mammogram for malignant neoplasm of breast: Secondary | ICD-10-CM

## 2017-05-25 ENCOUNTER — Ambulatory Visit (INDEPENDENT_AMBULATORY_CARE_PROVIDER_SITE_OTHER): Payer: Medicare Other

## 2017-05-25 DIAGNOSIS — R8290 Unspecified abnormal findings in urine: Secondary | ICD-10-CM | POA: Diagnosis not present

## 2017-05-25 LAB — MICROSCOPIC EXAMINATION: RBC, UA: NONE SEEN /hpf (ref 0–?)

## 2017-05-25 LAB — URINALYSIS, COMPLETE
Bilirubin, UA: NEGATIVE
Glucose, UA: NEGATIVE
Ketones, UA: NEGATIVE
Nitrite, UA: NEGATIVE
PH UA: 5.5 (ref 5.0–7.5)
PROTEIN UA: NEGATIVE
Specific Gravity, UA: <1.005 — ABNORMAL LOW (ref 1.005–1.030)
UUROB: 0.2 mg/dL (ref 0.2–1.0)

## 2017-05-25 NOTE — Progress Notes (Signed)
In and Out Catheterization  Patient is present today for a I & O catheterization due to possible UTI, patient complaining of bad odor in urine. Patient was cleaned and prepped in a sterile fashion with betadine and Lidocaine 2% jelly was instilled into the urethra.  A 14FR cath was inserted no complications were noted , 41ml of urine return was noted, urine was yellow in color. A clean urine sample was collected for UA and Culture. Bladder was drained  And catheter was removed with out difficulty.    Preformed by: Fonnie Jarvis, CMA  Follow up/ Additional notes: will call with results

## 2017-05-29 NOTE — Progress Notes (Signed)
05/30/2017 2:09 PM   Barbara Mooney 12/23/1933 [id number]  Referring provider: Marinda Elk, MD [facility] [place], [place] [address]  Chief Complaint  Patient presents with  . Urinary Incontinence    Mixed /  Cystocele  3 month follow up     HPI: 83 yo WF who presents today for a three month follow up for recurrent UTI's, vaginal atrophy, mixed urinary incontinence, cystocele, vaginal polyp and nocturia with her daughter, Barbara Mooney.    Recurrent UTI's Patient is a 55 -year-old Caucasian female who is referred to [place] by, Barbara Elk, MD, for recurrent urinary tract infections.   Patient has had more than three urinary tract infections over the last year.  Her symptoms with a urinary tract infection consist of flu like symptoms, dysuria, chills, lower back pain and malodorous urine.   RUS in 01/2017 did not identify a nidus for UTI's.   She is drinking four 16 ounce bottles of water daily.  She is taking cranberry tablets.  She is not taking probiotics and she not taking Vitamin C.  One cup of coffee daily.  On 05/25/2017, patient's daughter called the office stating that her mother was having a foul smell to her urine.  She was catheterized for a specimen.  Final urine culture are made available this morning for E. Coli.    Vaginal atrophy She was started on vaginal estrogen cream five weeks ago by Dr. Georga Mooney.  She is applying it three nights weekly.    Mixed urinary incontinence She is engaging in toilet mapping and has 0-3 incontinent episodes weekly.  Her PVR is 0 mL.    Cystocele She has seen Dr. Georga Mooney and has been fitted for a pessary.    Vaginal polyp She was told it was benign.    Nocturia Patient has been experiencing nocturia 4-7 times nightly.     PMH: Past Medical History:  Diagnosis Date  . Arthritis   . Bruises easily   . Diabetes mellitus   . GERD (gastroesophageal reflux disease)   . Hypertension    . Multiple thyroid nodules     Surgical History: Past Surgical History:  Procedure Laterality Date  . CARDIOVERSION N/A 01/20/2017   Procedure: CARDIOVERSION;  Surgeon: Minna Merritts, MD;  Location: [facility] ORS;  Service: Cardiovascular;  Laterality: N/A;    Home Medications:  Allergies as of 05/30/2017   No Known Allergies     Medication List       Accurate as of 05/30/17  2:09 PM. Always use your most recent med list.          amoxicillin-clavulanate 875-125 MG tablet Commonly known as:  AUGMENTIN Take 1 tablet by mouth every 12 (twelve) hours.   apixaban 5 MG Tabs tablet Commonly known as:  ELIQUIS Take 1 tablet (5 mg total) by mouth 2 (two) times daily.   ciprofloxacin 250 MG tablet Commonly known as:  CIPRO Take 1 tablet (250 mg total) by mouth 2 (two) times daily.   CRANBERRY PO Take 2 tablets by mouth daily.   cyanocobalamin 1000 MCG/ML injection Commonly known as:  (VITAMIN B-12) Inject into the muscle.   diltiazem 360 MG 24 hr capsule Commonly known as:  CARDIZEM CD TAKE 1 CAPSULE BY MOUTH DAILY   FERROUS SULFATE PO Take 1 tablet by mouth daily.   furosemide 20 MG tablet Commonly known as:  LASIX Take 1 tablet (20 mg total) by mouth daily as needed for  fluid or edema (SOB).   metFORMIN 500 MG tablet Commonly known as:  GLUCOPHAGE Take 500 mg by mouth 2 (two) times daily with a meal.   metoprolol succinate 100 MG 24 hr tablet Commonly known as:  TOPROL-XL Take 1 tablet (100 mg total) by mouth 2 (two) times daily. Take with or immediately following a meal.   naproxen sodium 220 MG tablet Commonly known as:  ANAPROX Take 220-440 mg by mouth 2 (two) times daily as needed (pain).   nitrofurantoin (macrocrystal-monohydrate) 100 MG capsule Commonly known as:  MACROBID Take 1 capsule (100 mg total) by mouth every 12 (twelve) hours.   omeprazole 20 MG capsule Commonly known as:  PRILOSEC Take 1 capsule (20 mg total) by mouth daily.   ondansetron  4 MG disintegrating tablet Commonly known as:  ZOFRAN ODT Take 1 tablet (4 mg total) by mouth every 8 (eight) hours as needed for nausea or vomiting.   potassium chloride SA 20 MEQ tablet Commonly known as:  KLOR-CON M20 Take 1 tablet (20 mEq total) by mouth daily as needed (take with lasix).   potassium chloride SA 20 MEQ tablet Commonly known as:  K-DUR,KLOR-CON Take by mouth.       Allergies: No Known Allergies  Family History: Family History  Problem Relation Age of Onset  . Breast cancer Sister 65  . Ovarian cancer Maternal Aunt   . Kidney cancer Neg Hx   . Bladder Cancer Neg Hx     Social History:  reports that she has never smoked. She has never used smokeless tobacco. She reports that she does not drink alcohol or use drugs.  ROS: UROLOGY Frequent Urination?: No Hard to postpone urination?: No Burning/pain with urination?: No Get up at night to urinate?: No Leakage of urine?: No Urine stream starts and stops?: No Trouble starting stream?: No Do you have to strain to urinate?: No Blood in urine?: No Urinary tract infection?: No Sexually transmitted disease?: No Injury to kidneys or bladder?: No Painful intercourse?: No Weak stream?: No Currently pregnant?: No Vaginal bleeding?: No Last menstrual period?: n  Gastrointestinal Nausea?: No Vomiting?: No Indigestion/heartburn?: No Diarrhea?: No Constipation?: No  Constitutional Fever: No Night sweats?: No Weight loss?: No Fatigue?: No  Skin Skin rash/lesions?: No Itching?: No  Eyes Blurred vision?: No Double vision?: No  Ears/Nose/Throat Sore throat?: No Sinus problems?: No  Hematologic/Lymphatic Swollen glands?: No Easy bruising?: No  Cardiovascular Leg swelling?: No Chest pain?: No  Respiratory Cough?: No Shortness of breath?: No  Endocrine Excessive thirst?: No  Musculoskeletal Back pain?: No Joint pain?: No  Neurological Headaches?: No Dizziness?:  No  Psychologic Depression?: No Anxiety?: No  Physical Exam: BP (!) 162/74   Pulse 66   Ht 5' 5.5" (1.664 m)   Wt 166 lb 6.4 oz (75.5 kg)   BMI 27.27 kg/m   Constitutional: Well nourished. Alert and oriented, No acute distress. HEENT: [place] AT, moist mucus membranes. Trachea midline, no masses. Cardiovascular: No clubbing, cyanosis, or edema. Respiratory: Normal respiratory effort, no increased work of breathing. GI: Abdomen is soft, non tender, non distended, no abdominal masses. Liver and spleen not palpable.  No hernias appreciated.  Stool sample for occult testing is not indicated.   GU: No CVA tenderness.  No bladder fullness or masses.  Atrophic external genitalia, normal pubic hair distribution, no lesions.  Normal urethral meatus, no lesions, no prolapse, no discharge.   No urethral masses, tenderness and/or tenderness. No bladder fullness, tenderness or masses. Pale vagina  mucosa, poor estrogen effect, pessary in place.   Anus and perineum are without rashes or lesions.    Skin: No rashes, bruises or suspicious lesions. Lymph: No cervical or inguinal adenopathy. Neurologic: Grossly intact, no focal deficits, moving all 4 extremities. Psychiatric: Normal mood and affect.  Laboratory Data: Lab Results  Component Value Date   WBC 7.9 01/13/2017   HGB 13.2 01/13/2017   HCT 40.1 01/13/2017   MCV 91.1 01/13/2017   PLT 296 01/13/2017    Lab Results  Component Value Date   CREATININE 0.84 03/14/2017     Lab Results  Component Value Date   TSH 2.340 11/01/2016     Lab Results  Component Value Date   AST 18 10/28/2016   Lab Results  Component Value Date   ALT 14 10/28/2016    Pertinent Imaging: Results for Barbara Mooney, Barbara Mooney (MRN [id number]) as of 05/30/2017 13:44  Ref. Range 05/30/2017 13:42  Scan Result Unknown 0    Assessment & Plan:    1. Recurrent UTI"s  - RUS was negative  - patient has increased her water intake  - she is eating yogurt and  fermented vegetables  - she is taking cranberry tablets  - she is not taking Vitamin C 1000 mg  - patient started on Macrobid at today's visit for current UTI  - reminded patient to come in for CATH UA for symptoms of an UTI   2. Vaginal atrophy  - patient has been seen by gynecology and started on vaginal estrogen cream  3. Cystocele  - pessary in place                            4. Nocturia  - not bothersome to patient at this time  5. Mixed urinary incontinence  - not a candidate for anticholinergic therapy due to age and has failed it in the past  - not a candidate for Myrbetriq due to uncontrolled HTN  - patient still deferred PT and PTNS at this time     Return in about 1 year (around 05/30/2018) for OAB questionnaire, PVR and exam.  These notes generated with voice recognition software. I apologize for typographical errors.  Zara Council, [facility] [address] [place], [place] [address] [phone]

## 2017-05-30 ENCOUNTER — Encounter: Payer: Self-pay | Admitting: Urology

## 2017-05-30 ENCOUNTER — Ambulatory Visit (INDEPENDENT_AMBULATORY_CARE_PROVIDER_SITE_OTHER): Payer: Medicare Other | Admitting: Urology

## 2017-05-30 VITALS — BP 162/74 | HR 66 | Ht 65.5 in | Wt 166.4 lb

## 2017-05-30 DIAGNOSIS — N3946 Mixed incontinence: Secondary | ICD-10-CM

## 2017-05-30 DIAGNOSIS — N8111 Cystocele, midline: Secondary | ICD-10-CM

## 2017-05-30 DIAGNOSIS — R351 Nocturia: Secondary | ICD-10-CM

## 2017-05-30 DIAGNOSIS — N842 Polyp of vagina: Secondary | ICD-10-CM

## 2017-05-30 DIAGNOSIS — N390 Urinary tract infection, site not specified: Secondary | ICD-10-CM | POA: Diagnosis not present

## 2017-05-30 DIAGNOSIS — N952 Postmenopausal atrophic vaginitis: Secondary | ICD-10-CM

## 2017-05-30 LAB — BLADDER SCAN AMB NON-IMAGING: SCAN RESULT: 0

## 2017-05-30 LAB — CULTURE, URINE COMPREHENSIVE

## 2017-05-30 MED ORDER — NITROFURANTOIN MONOHYD MACRO 100 MG PO CAPS: 100.0000 mg | ORAL_CAPSULE | Freq: Two times a day (BID) | ORAL | 0 refills | Status: DC

## 2017-06-24 ENCOUNTER — Encounter: Payer: Self-pay | Admitting: Urology

## 2017-06-24 ENCOUNTER — Telehealth: Payer: Self-pay | Admitting: *Deleted

## 2017-06-24 NOTE — Telephone Encounter (Signed)
Patient Email Open    06/24/2017 [facility]  Laneta Simmers  Urology   Conversation: Non-Urgent Medical Question  (Newest Message First)  June 24, 2017  Me  to [place]       2:29 PM  Hello, Larene Beach is out of the office until next week. I will forward her this message. I think you are doing everything Larene Beach has mentioned. If she has something to add she will have someone call you next week.    This MyChart message has not been read.  Barbara Mooney  to [employer], PA-C   Larene Beach,   My mom, [place] has her regular check up on Monday with Mimi at [facility]. She did her labs this week and they have contacted her that she has another UTI. They have prescribed Ciftin - 250mg  twice/day. Wanted to let you know for her chart since you all do not connect with [facility] My Chart.   She is very discouraged. Has been doing everything suggested - water, cranberry juice and tabs, drinking vinegar, eating foods that are vinegar based. Is there anything else at this point she should try or just continue to monitor?   Thank you.   [name]

## 2017-06-26 NOTE — Telephone Encounter (Signed)
Was Mrs. Schleicher having any symptoms when she saw Mimi at [facility]?

## 2017-06-29 NOTE — Telephone Encounter (Signed)
Returned daughter's call. No answer. Will try later.

## 2017-07-05 NOTE — Telephone Encounter (Signed)
Spoke to daughter [name]. Says pt was asymptomatic when she saw [facility] [place]. Just wanted to let [facility] know. Pt doing better now.

## 2017-08-02 ENCOUNTER — Encounter: Payer: Self-pay | Admitting: Cardiovascular Disease

## 2017-08-07 DIAGNOSIS — E118 Type 2 diabetes mellitus with unspecified complications: Secondary | ICD-10-CM | POA: Insufficient documentation

## 2017-08-07 DIAGNOSIS — Z794 Long term (current) use of insulin: Secondary | ICD-10-CM

## 2017-08-07 NOTE — Progress Notes (Signed)
Cardiology Office Note  Date:  08/08/2017   ID:  Barbara Mooney, DOB 02/14/1934, MRN [id number]  PCP:  Marinda Elk, MD   Chief Complaint  Patient presents with  . other    6 month f/u c/o skipping heart beat yesterday. Meds reviewed verbally with pt.    HPI:  [name] is an 83yo woman with  hypertension,  diabetes  GERD  sepsis November 2017,  noted to be in atrial fibrillation with RVR,  Who presents for routine follow-up of her persistent atrial fibrillation  Since her last clinic visit she has been doing well Maintaining normal sinus rhythm Thought she felt some irregular heartbeat yesterday, unclear Concerned about the cost of her medications including the Cardizem and eliquis   previously in the ED 10/28/16 with generalized weakness, fatigue, fever and chills  found to have sepsis secondary to a urinary source   hypotensive , atrial fibrillation  Echocardiogram done in the hospital   concentric hypertrophy. Systolic function was normal. EF 55% to 60%.   asymptomatic from her atrial fibrillation  Metoprolol and Cardizem added for rate control  Currently taking Eliquis 5mg  bid.    This patients CHA2DS2-VASc Score and unadjusted Ischemic Stroke Rate (% per year) is equal to 7.2 % stroke rate/year from a score of 5  Above score calculated as 1 point each if present [CHF, HTN, DM, Vascular=MI/PAD/Aortic Plaque, Age if 65-74, or Female] Above score calculated as 2 points each if present [Age > 75, or Stroke/TIA/TE]  Cardioversion on 01/20/2017, back to NSR Holding normal sinus rhythm since that time  EKG personally reviewed by myself on todays visit  normal sinus rhythm with rate 65 bpm right bundle branch block   PMH:   has a past medical history of Arthritis; Bruises easily; Diabetes mellitus; GERD (gastroesophageal reflux disease); Hypertension; and Multiple thyroid nodules.  PSH:    Past Surgical History:  Procedure Laterality Date  . CARDIOVERSION  N/A 01/20/2017   Procedure: CARDIOVERSION;  Surgeon: Minna Merritts, MD;  Location: [facility] ORS;  Service: Cardiovascular;  Laterality: N/A;    Current Outpatient Prescriptions  Medication Sig Dispense Refill  . apixaban (ELIQUIS) 5 MG TABS tablet Take 1 tablet (5 mg total) by mouth 2 (two) times daily. 60 tablet 2  . CRANBERRY PO Take 2 tablets by mouth daily.    . cyanocobalamin (,VITAMIN B-12,) 1000 MCG/ML injection Inject into the muscle.    . diltiazem (CARDIZEM CD) 360 MG 24 hr capsule TAKE 1 CAPSULE BY MOUTH DAILY 30 capsule 3  . FERROUS SULFATE PO Take 1 tablet by mouth daily.    . furosemide (LASIX) 20 MG tablet Take 1 tablet (20 mg total) by mouth daily as needed for fluid or edema (SOB). 30 tablet 5  . metFORMIN (GLUCOPHAGE) 500 MG tablet Take 500 mg by mouth 2 (two) times daily with a meal.    . metoprolol succinate (TOPROL-XL) 100 MG 24 hr tablet Take 1 tablet (100 mg total) by mouth 2 (two) times daily. Take with or immediately following a meal. 60 tablet 1  . omeprazole (PRILOSEC) 20 MG capsule Take 1 capsule (20 mg total) by mouth daily. 30 capsule 6  . potassium chloride SA (K-DUR,KLOR-CON) 20 MEQ tablet Take by mouth.     No current facility-administered medications for this visit.      Allergies:   Patient has no known allergies.   Social History:  The patient  reports that she has never smoked. She has never  used smokeless tobacco. She reports that she does not drink alcohol or use drugs.   Family History:   family history includes Breast cancer (age of onset: 79) in her sister; Ovarian cancer in her maternal aunt.    Review of Systems: Review of Systems  Constitutional: Positive for malaise/fatigue.  Respiratory: Negative.   Cardiovascular: Negative.   Gastrointestinal: Negative.   Musculoskeletal: Negative.   Neurological: Positive for weakness.  Psychiatric/Behavioral: Negative.   All other systems reviewed and are negative.    PHYSICAL EXAM: VS:  BP (!)  168/64 (BP Location: Left Arm, Patient Position: Sitting, Cuff Size: Normal)   Pulse 65   Ht 5\' 6"  (1.676 m)   Wt 162 lb 4 oz (73.6 kg)   BMI 26.19 kg/m  , BMI Body mass index is 26.19 kg/m. GEN: Well nourished, well developed, in no acute distress  HEENT: normal  Neck: no JVD, carotid bruits, or masses Cardiac: Irregularly irregular,  no murmurs, rubs, or gallops,no edema  Respiratory:  clear to auscultation bilaterally, normal work of breathing GI: soft, nontender, nondistended, + BS MS: no deformity or atrophy  Skin: warm and dry, no rash Neuro:  Strength and sensation are intact Psych: euthymic mood, full affect    Recent Labs: 10/28/2016: ALT 14 11/01/2016: TSH 2.340 01/13/2017: Hemoglobin 13.2; Platelets 296 03/14/2017: BUN 16; Creatinine, Ser 0.84; Potassium 3.8; Sodium 142    Lipid Panel No results found for: CHOL, HDL, LDLCALC, TRIG    Wt Readings from Last 3 Encounters:  08/08/17 162 lb 4 oz (73.6 kg)  05/30/17 166 lb 6.4 oz (75.5 kg)  04/05/17 166 lb 8 oz (75.5 kg)       ASSESSMENT AND PLAN:  Paroxysmal atrial fibrillation (HCC) - Plan: EKG 12-Lead Long discussion concerning paroxysmal atrial fibrillation Will stay on current medications, recommended a check the price of xarelto Suggested she change her Cardizem down to 180 mg twice a day for cost reasons  Essential hypertension Blood pressure elevated on today's visit Previous office visits reviewed showing systolic pressure typically 140 Before we had any medications, recommended she monitor blood pressure at home and call us with some numbers  Unsteady gait She completed home PT   Total encounter time more than 25 minutes  Greater than 50% was spent in counseling and coordination of care with the patient   Disposition:   F/U  6 months   No orders of the defined types were placed in this encounter.    Signed, Esmond Plants, M.D., Ph.D. 08/08/2017  [place],  [place]

## 2017-08-08 ENCOUNTER — Encounter: Payer: Self-pay | Admitting: Cardiovascular Disease

## 2017-08-08 ENCOUNTER — Ambulatory Visit (INDEPENDENT_AMBULATORY_CARE_PROVIDER_SITE_OTHER): Payer: Medicare Other | Admitting: Cardiovascular Disease

## 2017-08-08 VITALS — BP 168/64 | HR 65 | Ht 66.0 in | Wt 162.2 lb

## 2017-08-08 DIAGNOSIS — I480 Paroxysmal atrial fibrillation: Secondary | ICD-10-CM | POA: Diagnosis not present

## 2017-08-08 DIAGNOSIS — M7989 Other specified soft tissue disorders: Secondary | ICD-10-CM | POA: Diagnosis not present

## 2017-08-08 DIAGNOSIS — R2681 Unsteadiness on feet: Secondary | ICD-10-CM | POA: Diagnosis not present

## 2017-08-08 DIAGNOSIS — E118 Type 2 diabetes mellitus with unspecified complications: Secondary | ICD-10-CM | POA: Diagnosis not present

## 2017-08-08 DIAGNOSIS — Z794 Long term (current) use of insulin: Secondary | ICD-10-CM | POA: Diagnosis not present

## 2017-08-08 DIAGNOSIS — I10 Essential (primary) hypertension: Secondary | ICD-10-CM

## 2017-08-08 DIAGNOSIS — Z7189 Other specified counseling: Secondary | ICD-10-CM | POA: Diagnosis not present

## 2017-08-08 MED ORDER — DILTIAZEM HCL ER COATED BEADS 180 MG PO CP24: 180.0000 mg | ORAL_CAPSULE | Freq: Two times a day (BID) | ORAL | 3 refills | Status: DC

## 2017-08-08 NOTE — Patient Instructions (Signed)
Medication Instructions:   Change to diltiazem 180 mg twice a day, Stop the 360 mg pill  Labwork:  No new labs needed  Testing/Procedures:  No further testing at this time   Follow-Up: It was a pleasure seeing you in the office today. Please call us if you have new issues that need to be addressed before your next appt.  [phone]  Your physician wants you to follow-up in: 6 months.  You will receive a reminder letter in the mail two months in advance. If you don't receive a letter, please call our office to schedule the follow-up appointment.  If you need a refill on your cardiac medications before your next appointment, please call your pharmacy.

## 2017-08-25 ENCOUNTER — Encounter: Payer: Self-pay | Admitting: Cardiovascular Disease

## 2017-09-22 ENCOUNTER — Other Ambulatory Visit: Payer: Self-pay | Admitting: Surgery

## 2017-09-22 DIAGNOSIS — M1711 Unilateral primary osteoarthritis, right knee: Secondary | ICD-10-CM

## 2017-10-05 ENCOUNTER — Ambulatory Visit
Admission: RE | Admit: 2017-10-05 | Discharge: 2017-10-05 | Disposition: A | Discharge status: Home / Self Care | Payer: Medicare Other | Source: Ambulatory Visit | Attending: Surgery | Admitting: Surgery

## 2017-10-05 DIAGNOSIS — X58XXXA Exposure to other specified factors, initial encounter: Secondary | ICD-10-CM | POA: Insufficient documentation

## 2017-10-05 DIAGNOSIS — M1711 Unilateral primary osteoarthritis, right knee: Secondary | ICD-10-CM | POA: Diagnosis not present

## 2017-10-05 DIAGNOSIS — S83241A Other tear of medial meniscus, current injury, right knee, initial encounter: Secondary | ICD-10-CM | POA: Diagnosis not present

## 2017-10-07 DIAGNOSIS — C4021 Malignant neoplasm of long bones of right lower limb: Secondary | ICD-10-CM | POA: Insufficient documentation

## 2017-10-10 ENCOUNTER — Encounter: Payer: Self-pay | Admitting: Cardiovascular Disease

## 2017-10-13 DIAGNOSIS — M898X6 Other specified disorders of bone, lower leg: Secondary | ICD-10-CM | POA: Insufficient documentation

## 2017-10-24 ENCOUNTER — Other Ambulatory Visit: Payer: Self-pay | Admitting: *Deleted

## 2017-10-24 MED ORDER — POTASSIUM CHLORIDE CRYS ER 20 MEQ PO TBCR: 20.0000 meq | EXTENDED_RELEASE_TABLET | ORAL | 6 refills | Status: DC

## 2017-11-16 DIAGNOSIS — C8299 Follicular lymphoma, unspecified, extranodal and solid organ sites: Secondary | ICD-10-CM | POA: Insufficient documentation

## 2017-11-25 IMAGING — MR MR KNEE*R* W/O CM
7 series · 37 of 40 positions shown · non-contrast
Comparison: None.

CLINICAL DATA: Chronic posterior right knee pain.

EXAM:
MRI OF THE RIGHT KNEE WITHOUT CONTRAST
TECHNIQUE: Multiplanar, multisequence MR imaging of the knee was performed. No
intravenous contrast was administered.

[Series 3: PD fat-sat · axial · 3.0mm · 0.50mm/px · z∈[-95,+33]mm · 7 of 40 slices shown (1 of 4)]
[im 1/40]
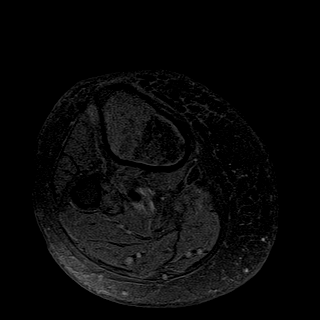
[im 7/40]
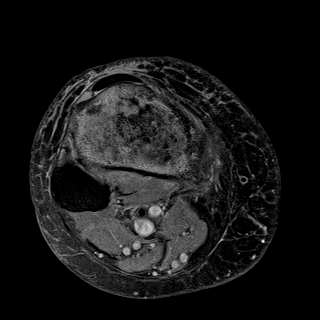
[im 14/40]
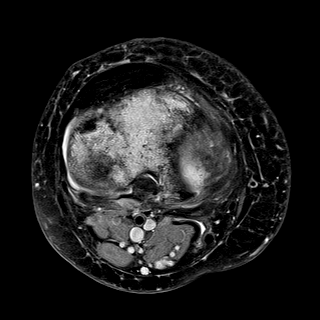
[im 20/40]
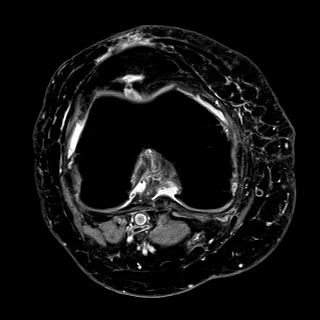
[im 27/40]
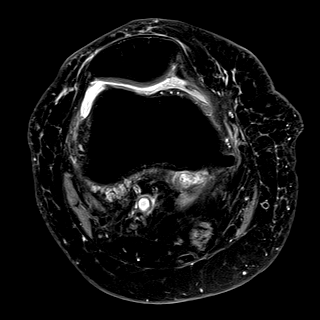
[im 33/40]
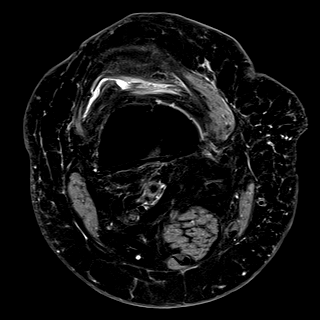
[im 40/40]
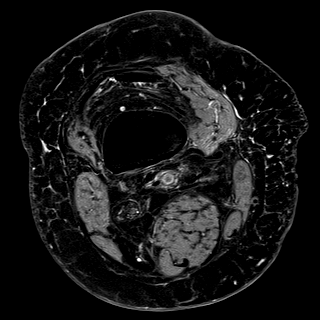

[Series 4: T1 · coronal · 3.0mm · 0.50mm/px · 6 of 33 slices shown]
[im 1/33]
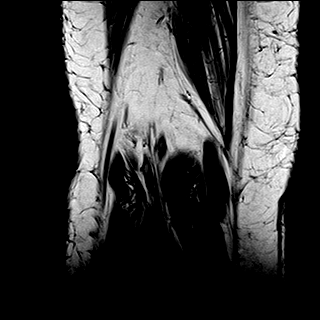
[im 7/33]
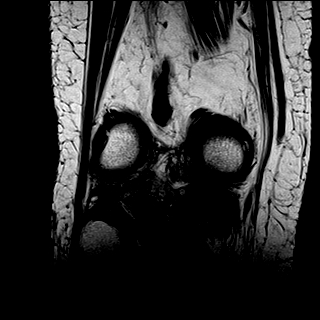
[im 13/33]
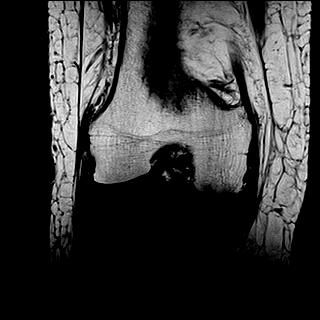
[im 20/33]
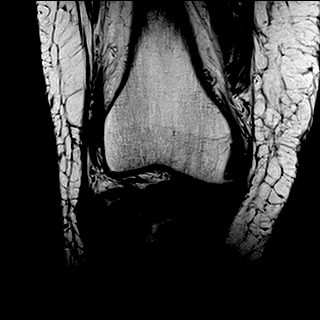
[im 26/33]
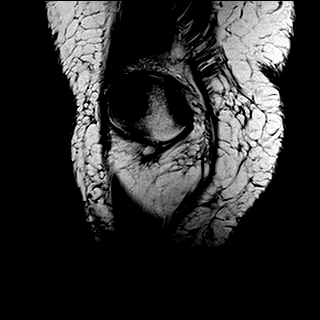
[im 33/33]
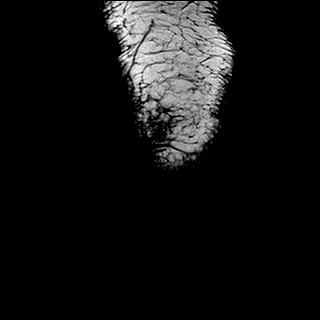

[Series 5: T2 fat-sat · coronal · 3.0mm · 0.31mm/px · 6 of 33 slices shown]
[im 1/33]
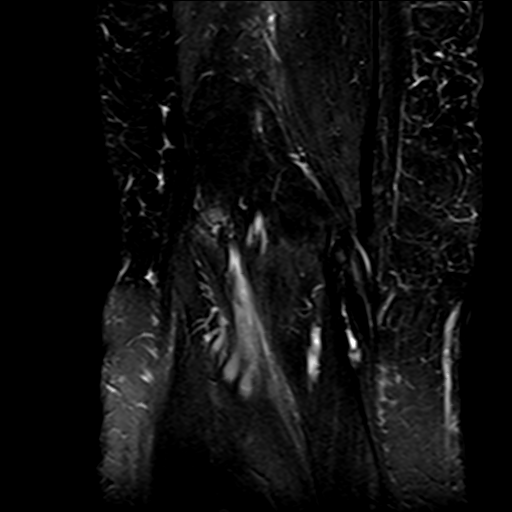
[im 7/33]
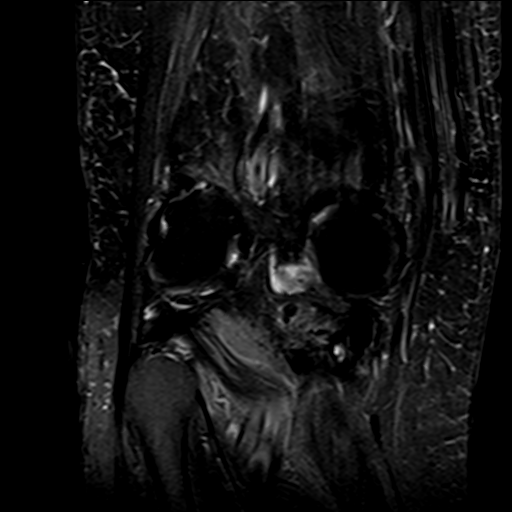
[im 13/33]
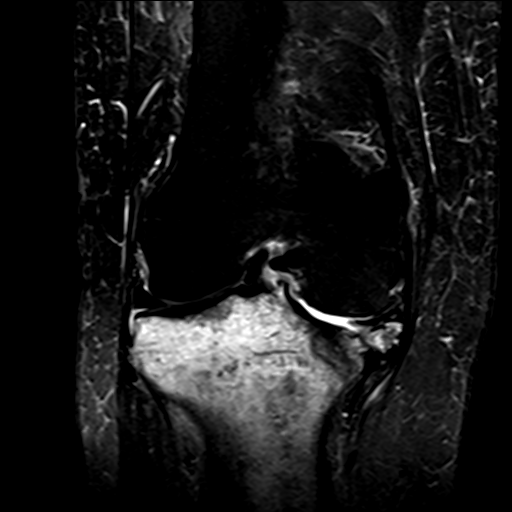
[im 20/33]
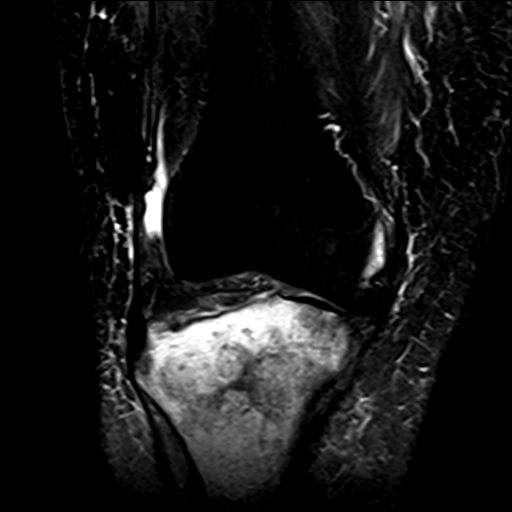
[im 26/33]
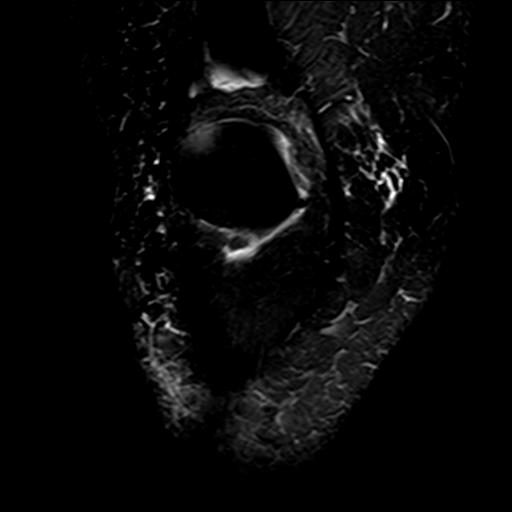
[im 33/33]
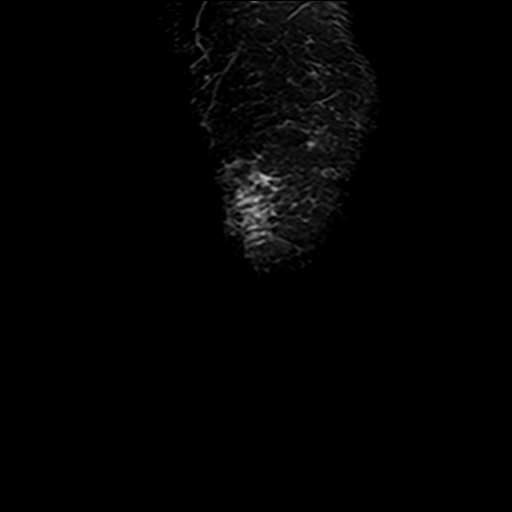

[Series 6: PD fat-sat · sagittal · 3.0mm · 0.50mm/px · 6 of 34 slices shown (2 of 4)]
[im 1/34]
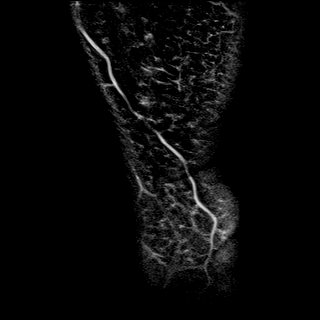
[im 7/34]
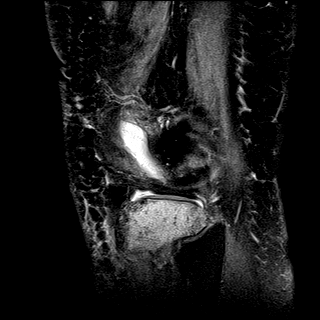
[im 14/34]
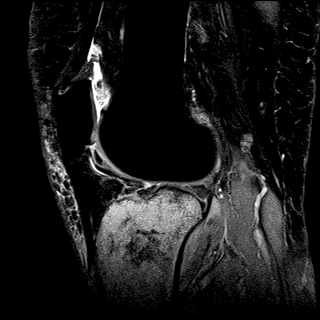
[im 20/34]
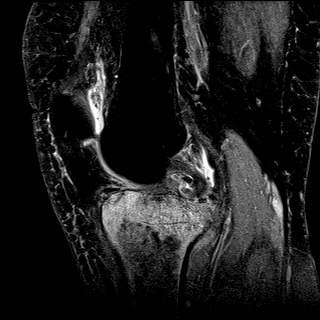
[im 27/34]
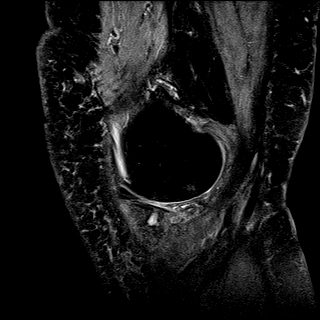
[im 34/34]
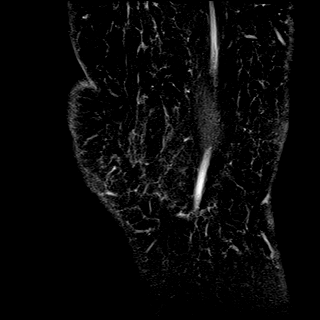

[Series 7: PD fat-sat · coronal · 3.0mm · 0.50mm/px · 6 of 33 slices shown (3 of 4)]
[im 1/33]
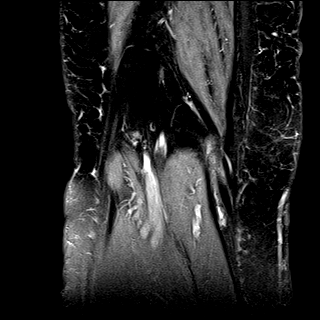
[im 7/33]
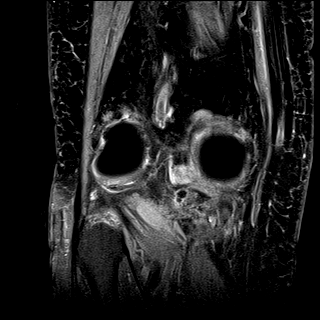
[im 13/33]
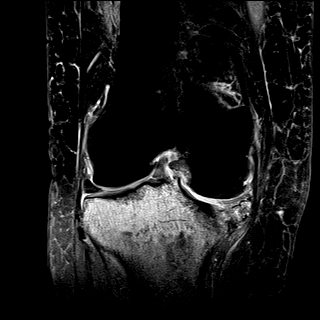
[im 20/33]
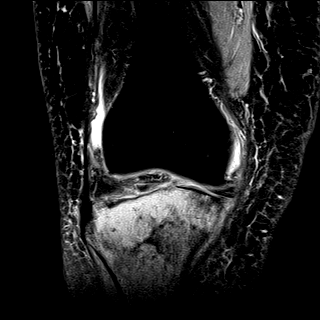
[im 26/33]
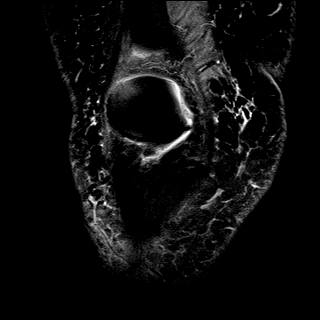
[im 33/33]
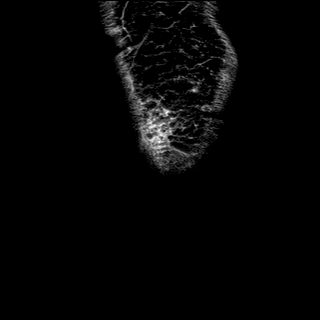

[Series 8: STIR · sagittal · 3.0mm · 0.62mm/px · 3 of 35 slices shown]
[im 1/35]
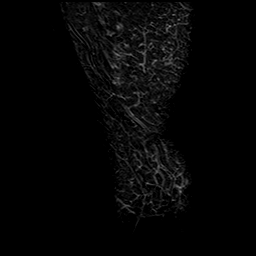
[im 7/35]
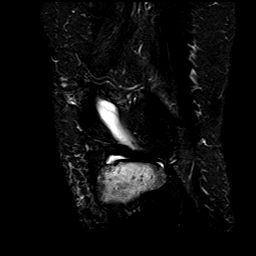
[im 14/35]
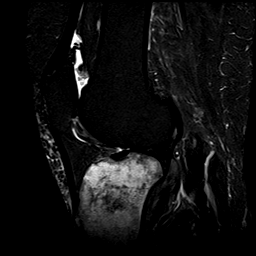

[Series 9: PD fat-sat · oblique · 2.0mm · 0.62mm/px · 3 of 17 slices shown (4 of 4)]
[im 1/17]
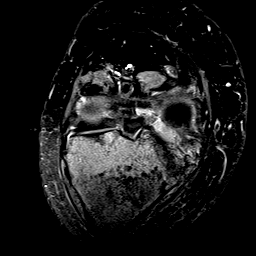
[im 9/17]
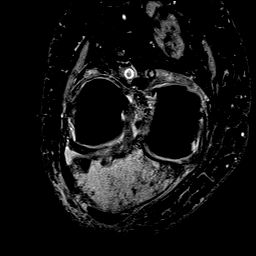
[im 17/17]
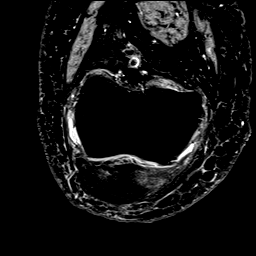

[37 of 40 positions shown; findings below may reference images not displayed]

FINDINGS: MENISCI

Medial meniscus: Severely degenerated and torn the posterior horn
and midbody regions.

Lateral meniscus:  Intact

LIGAMENTS

Cruciates:  Intact

Collaterals:  Intact

CARTILAGE

Patellofemoral:  Mild to moderate degenerative chondrosis.

Medial: Advanced degenerative chondrosis with areas of
full-thickness cartilage loss along with joint space narrowing and
spurring.

Lateral:  Moderate degenerative chondrosis.

Joint: Small amount of joint fluid but no overt joint effusion. Mild
synovitis. Superior and medial patellar plica are noted. No findings
to suggest septic arthritis.

Popliteal Fossa:  No popliteal mass or Baker's cyst.

Extensor Mechanism: The patella retinacular structures are intact
and the quadriceps and patellar tendons are intact.

Bones: Diffuse abnormal marrow signal in the tibia involving both
the epiphysis and metaphysis. Somewhat marbled appearance. Findings
suspicious for infection or infiltrating tumor such as lymphoma or
metastatic disease. There are also subchondral fractures and mild
fragmentation involving the medial tibial plateau which are likely
chronic and could be pathologic.

The femur, fibula and patella are normal. A recent MRI of the
shoulder from 05/02/2017 does not demonstrate any marrow
abnormality.

Other: The knee musculature appears normal.
IMPRESSION: 1. Abnormal marrow signal involving the tibia with probable remote
fractures involving the medial tibial plateau, possibly pathologic.
Findings worrisome for infection or neoplasm, which is favored.
Recommend correlation any known primary cancer. A whole-body bone
scan may be helpful to assess for other areas of abnormality.
Patient may require bone biopsy.
2. Degenerated and torn medial meniscus.
3. Intact ligamentous structures.

These results will be called to the ordering clinician or
representative by the Radiologist Assistant, and communication
documented in the PACS or zVision Dashboard.

## 2017-11-29 ENCOUNTER — Encounter: Payer: Self-pay | Admitting: Cardiovascular Disease

## 2017-12-01 ENCOUNTER — Encounter: Payer: Self-pay | Admitting: Radiation Oncology

## 2017-12-08 ENCOUNTER — Ambulatory Visit
Admission: RE | Admit: 2017-12-08 | Discharge: 2017-12-08 | Disposition: A | Discharge status: Home / Self Care | Payer: Medicare Other | Source: Ambulatory Visit | Attending: Radiation Oncology | Admitting: Radiation Oncology

## 2017-12-08 VITALS — BP 145/74 | HR 67 | Temp 97.3°F

## 2017-12-08 DIAGNOSIS — Z8041 Family history of malignant neoplasm of ovary: Secondary | ICD-10-CM | POA: Insufficient documentation

## 2017-12-08 DIAGNOSIS — K219 Gastro-esophageal reflux disease without esophagitis: Secondary | ICD-10-CM | POA: Insufficient documentation

## 2017-12-08 DIAGNOSIS — M129 Arthropathy, unspecified: Secondary | ICD-10-CM | POA: Insufficient documentation

## 2017-12-08 DIAGNOSIS — Z7901 Long term (current) use of anticoagulants: Secondary | ICD-10-CM | POA: Insufficient documentation

## 2017-12-08 DIAGNOSIS — Z803 Family history of malignant neoplasm of breast: Secondary | ICD-10-CM | POA: Diagnosis not present

## 2017-12-08 DIAGNOSIS — Z510 Encounter for antineoplastic radiation therapy: Secondary | ICD-10-CM | POA: Insufficient documentation

## 2017-12-08 DIAGNOSIS — E042 Nontoxic multinodular goiter: Secondary | ICD-10-CM | POA: Insufficient documentation

## 2017-12-08 DIAGNOSIS — Z79899 Other long term (current) drug therapy: Secondary | ICD-10-CM | POA: Diagnosis not present

## 2017-12-08 DIAGNOSIS — I10 Essential (primary) hypertension: Secondary | ICD-10-CM | POA: Diagnosis not present

## 2017-12-08 DIAGNOSIS — Z7984 Long term (current) use of oral hypoglycemic drugs: Secondary | ICD-10-CM | POA: Insufficient documentation

## 2017-12-08 DIAGNOSIS — E119 Type 2 diabetes mellitus without complications: Secondary | ICD-10-CM | POA: Insufficient documentation

## 2017-12-08 DIAGNOSIS — C8205 Follicular lymphoma grade I, lymph nodes of inguinal region and lower limb: Secondary | ICD-10-CM | POA: Insufficient documentation

## 2017-12-08 DIAGNOSIS — C8299 Follicular lymphoma, unspecified, extranodal and solid organ sites: Secondary | ICD-10-CM

## 2017-12-08 NOTE — Consult Note (Signed)
NEW PATIENT EVALUATION  Name: Barbara Mooney  MRN: [id number]  Date:   12/08/2017     DOB: [birth year]   This 83 y.o. 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 Lymphoma    Initial Evaluation    DIAGNOSIS: The encounter diagnosis was Follicular non-Hodgkin's lymphoma of bone ([place]).   PREVIOUS INVESTIGATIONS:  MRI scans reviewed PET CT scans requested Pathology reports reviewed Clinical notes reviewed  HPI: Patient is a 83 year old female who is being evaluated by orthopedic surgery for potential right knee replacement and in and MRI scan performed 10/06/2017 showing an abnormal marrow signal involving the proximal tibia with probable remote fractures involving the medial tibial plateau which may be consistent with malignancy. Bone scan performed 10/19/2017 showed increased uptake in the proximal right tibia and a bone biopsy was performed at [facility] on 10/31/2017 which was consistent with B-cell lymphoma of germinal center origin suggestive of follicular lymphoma HE03 positive. She underwent a PET CT scan which showed no other evidence of disease. She was evaluated by oncology at [facility] and recognition was made for radiation therapy his sole modality of treatment. She is seen today with her daughter for evaluation closer to home. She is doing well she is able to ambulate without significant pain. She has no fever chills or night sweats or any other areas of known adenopathy.  PLANNED TREATMENT REGIMEN: Radiation therapy to proximal right tibia  PAST MEDICAL HISTORY:  has a past medical history of Arthritis, Bruises easily, Diabetes mellitus, GERD (gastroesophageal reflux disease), Hypertension, and Multiple thyroid nodules.    PAST SURGICAL  HISTORY:  Past Surgical History:  Procedure Laterality Date  . CARDIOVERSION N/A 01/20/2017   Procedure: CARDIOVERSION;  Surgeon: Minna Merritts, MD;  Location: [facility] ORS;  Service: Cardiovascular;  Laterality: N/A;    FAMILY HISTORY: family history includes Breast cancer (age of onset: 34) in her sister; Ovarian cancer in her maternal aunt.  SOCIAL HISTORY:  reports that  has never smoked. she has never used smokeless tobacco. She reports that she does not drink alcohol or use drugs.  ALLERGIES: Patient has no known allergies.  MEDICATIONS:  Current Outpatient Medications  Medication Sig Dispense Refill  . apixaban (ELIQUIS) 5 MG TABS tablet Take 1 tablet (5 mg total) by mouth 2 (two) times daily. 60 tablet 2  . CRANBERRY PO Take 2 tablets by mouth daily.    . cyanocobalamin (,VITAMIN B-12,) 1000 MCG/ML injection Inject into the muscle.    . diltiazem (CARDIZEM CD) 180 MG 24 hr capsule Take 1 capsule (180 mg total) by mouth 2 (two) times daily. 180 capsule 3  . FERROUS SULFATE PO Take 1 tablet by mouth daily.    . furosemide (LASIX) 20 MG tablet Take 1 tablet (20 mg total) by mouth daily as needed for fluid or edema (SOB). 30 tablet 5  . metFORMIN (GLUCOPHAGE) 500 MG tablet Take 500 mg by mouth 2 (two) times daily with a meal.    . metoprolol succinate (TOPROL-XL) 100 MG 24 hr tablet Take 1 tablet (100 mg total) by mouth 2 (two) times daily. Take with or immediately following a meal. 60 tablet 1  . omeprazole (PRILOSEC) 20 MG capsule Take 1 capsule (20 mg total) by mouth daily. 30 capsule 6  .  potassium chloride SA (K-DUR,KLOR-CON) 20 MEQ tablet Take 1 tablet (20 mEq total) as directed by mouth. Once daily with extra pill 2 days a week 38 tablet 6   No current facility-administered medications for this encounter.     ECOG PERFORMANCE STATUS:  0 - Asymptomatic  REVIEW OF SYSTEMS:  Patient denies any weight loss, fatigue, weakness, fever, chills or night sweats. Patient denies any loss  of vision, blurred vision. Patient denies any ringing  of the ears or hearing loss. No irregular heartbeat. Patient denies heart murmur or history of fainting. Patient denies any chest pain or pain radiating to her upper extremities. Patient denies any shortness of breath, difficulty breathing at night, cough or hemoptysis. Patient denies any swelling in the lower legs. Patient denies any nausea vomiting, vomiting of blood, or coffee ground material in the vomitus. Patient denies any stomach pain. Patient states has had normal bowel movements no significant constipation or diarrhea. Patient denies any dysuria, hematuria or significant nocturia. Patient denies any problems walking, swelling in the joints or loss of balance. Patient denies any skin changes, loss of hair or loss of weight. Patient denies any excessive worrying or anxiety or significant depression. Patient denies any problems with insomnia. Patient denies excessive thirst, polyuria, polydipsia. Patient denies any swollen glands, patient denies easy bruising or easy bleeding. Patient denies any recent infections, allergies or URI. Patient "s visual fields have not changed significantly in recent time.    PHYSICAL EXAM: BP (!) 145/74   Pulse 67   Temp (!) 97.3 F (36.3 C)  There is no peripheral adenopathy detected. Range of motion of her right knee does not express pain. She has significant slightly pitting edema bilateral lower extremities. Well-developed well-nourished patient in NAD. HEENT reveals PERLA, EOMI, discs not visualized.  Oral cavity is clear. No oral mucosal lesions are identified. Neck is clear without evidence of cervical or supraclavicular adenopathy. Lungs are clear to A&P. Cardiac examination is essentially unremarkable with regular rate and rhythm without murmur rub or thrill. Abdomen is benign with no organomegaly or masses noted. Motor sensory and DTR levels are equal and symmetric in the upper and lower extremities.  Cranial nerves II through XII are grossly intact. Proprioception is intact. No peripheral adenopathy or edema is identified. No motor or sensory levels are noted. Crude visual fields are within normal range.  LABORATORY DATA: Pathology reports from [facility] reviewed and compatible above-stated findings    RADIOLOGY RESULTS: MRI scan reviewed PET CT scan requested for my review.   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 Would plan on delivering 3000 cGy in 15 fractions. I have personally 7 ordered CT simulation in about a week's time to allow for [place] to obtain her PET CT scan for review and allow some more healing from her bone biopsy. Risks and benefits of treatment including potential for increased edema in her right lower extremity skin reaction fatigue alteration of blood counts all were discussed in detail with the patient and her daughter. Both seem to comprehend my treatment plan well.  I would like to take this opportunity to thank you for allowing me to participate in the care of your patient.Noreene Filbert, MD

## 2017-12-13 DIAGNOSIS — I10 Essential (primary) hypertension: Secondary | ICD-10-CM | POA: Diagnosis not present

## 2017-12-13 DIAGNOSIS — K219 Gastro-esophageal reflux disease without esophagitis: Secondary | ICD-10-CM | POA: Diagnosis not present

## 2017-12-13 DIAGNOSIS — M129 Arthropathy, unspecified: Secondary | ICD-10-CM | POA: Diagnosis not present

## 2017-12-13 DIAGNOSIS — Z7901 Long term (current) use of anticoagulants: Secondary | ICD-10-CM | POA: Diagnosis not present

## 2017-12-13 DIAGNOSIS — E119 Type 2 diabetes mellitus without complications: Secondary | ICD-10-CM | POA: Diagnosis not present

## 2017-12-13 DIAGNOSIS — Z7984 Long term (current) use of oral hypoglycemic drugs: Secondary | ICD-10-CM | POA: Diagnosis not present

## 2017-12-13 DIAGNOSIS — E042 Nontoxic multinodular goiter: Secondary | ICD-10-CM | POA: Diagnosis not present

## 2017-12-13 DIAGNOSIS — C8590 Non-Hodgkin lymphoma, unspecified, unspecified site: Secondary | ICD-10-CM

## 2017-12-13 DIAGNOSIS — Z923 Personal history of irradiation: Secondary | ICD-10-CM

## 2017-12-13 DIAGNOSIS — Z8041 Family history of malignant neoplasm of ovary: Secondary | ICD-10-CM | POA: Diagnosis not present

## 2017-12-13 DIAGNOSIS — Z79899 Other long term (current) drug therapy: Secondary | ICD-10-CM | POA: Diagnosis not present

## 2017-12-13 DIAGNOSIS — Z803 Family history of malignant neoplasm of breast: Secondary | ICD-10-CM | POA: Diagnosis not present

## 2017-12-13 DIAGNOSIS — C8205 Follicular lymphoma grade I, lymph nodes of inguinal region and lower limb: Secondary | ICD-10-CM | POA: Diagnosis not present

## 2017-12-13 DIAGNOSIS — Z510 Encounter for antineoplastic radiation therapy: Secondary | ICD-10-CM | POA: Diagnosis present

## 2017-12-13 HISTORY — DX: Personal history of irradiation: Z92.3

## 2017-12-13 HISTORY — DX: Non-Hodgkin lymphoma, unspecified, unspecified site: C85.90

## 2017-12-14 ENCOUNTER — Ambulatory Visit
Admission: RE | Admit: 2017-12-14 | Discharge: 2017-12-14 | Disposition: A | Discharge status: Home / Self Care | Payer: Self-pay | Source: Ambulatory Visit | Attending: Radiation Oncology | Admitting: Radiation Oncology

## 2017-12-14 ENCOUNTER — Other Ambulatory Visit: Payer: Self-pay | Admitting: Radiation Oncology

## 2017-12-14 DIAGNOSIS — C8200 Follicular lymphoma grade I, unspecified site: Secondary | ICD-10-CM

## 2017-12-20 ENCOUNTER — Ambulatory Visit
Admission: RE | Admit: 2017-12-20 | Discharge: 2017-12-20 | Disposition: A | Discharge status: Home / Self Care | Payer: Medicare Other | Source: Ambulatory Visit | Attending: Radiation Oncology | Admitting: Radiation Oncology

## 2017-12-20 DIAGNOSIS — Z510 Encounter for antineoplastic radiation therapy: Secondary | ICD-10-CM | POA: Diagnosis not present

## 2017-12-22 ENCOUNTER — Other Ambulatory Visit: Payer: Self-pay | Admitting: *Deleted

## 2017-12-22 DIAGNOSIS — C8205 Follicular lymphoma grade I, lymph nodes of inguinal region and lower limb: Secondary | ICD-10-CM

## 2017-12-24 DIAGNOSIS — Z510 Encounter for antineoplastic radiation therapy: Secondary | ICD-10-CM | POA: Diagnosis not present

## 2017-12-27 ENCOUNTER — Ambulatory Visit: Payer: Medicare Other

## 2017-12-28 ENCOUNTER — Ambulatory Visit: Payer: Medicare Other

## 2017-12-29 ENCOUNTER — Ambulatory Visit: Payer: Medicare Other

## 2017-12-29 ENCOUNTER — Ambulatory Visit
Admission: RE | Admit: 2017-12-29 | Discharge: 2017-12-29 | Disposition: A | Discharge status: Home / Self Care | Payer: Medicare Other | Source: Ambulatory Visit | Attending: Radiation Oncology | Admitting: Radiation Oncology

## 2017-12-29 DIAGNOSIS — Z510 Encounter for antineoplastic radiation therapy: Secondary | ICD-10-CM | POA: Diagnosis not present

## 2017-12-30 ENCOUNTER — Ambulatory Visit: Payer: Medicare Other

## 2018-01-02 ENCOUNTER — Ambulatory Visit
Admission: RE | Admit: 2018-01-02 | Discharge: 2018-01-02 | Disposition: A | Discharge status: Home / Self Care | Payer: Medicare Other | Source: Ambulatory Visit | Attending: Radiation Oncology | Admitting: Radiation Oncology

## 2018-01-02 DIAGNOSIS — Z510 Encounter for antineoplastic radiation therapy: Secondary | ICD-10-CM | POA: Diagnosis not present

## 2018-01-03 ENCOUNTER — Ambulatory Visit
Admission: RE | Admit: 2018-01-03 | Discharge: 2018-01-03 | Disposition: A | Discharge status: Home / Self Care | Payer: Medicare Other | Source: Ambulatory Visit | Attending: Radiation Oncology | Admitting: Radiation Oncology

## 2018-01-03 DIAGNOSIS — Z510 Encounter for antineoplastic radiation therapy: Secondary | ICD-10-CM | POA: Diagnosis not present

## 2018-01-04 ENCOUNTER — Inpatient Hospital Stay: Payer: Medicare Other | Attending: Radiation Oncology

## 2018-01-04 ENCOUNTER — Ambulatory Visit
Admission: RE | Admit: 2018-01-04 | Discharge: 2018-01-04 | Disposition: A | Discharge status: Home / Self Care | Payer: Medicare Other | Source: Ambulatory Visit | Attending: Radiation Oncology | Admitting: Radiation Oncology

## 2018-01-04 DIAGNOSIS — Z510 Encounter for antineoplastic radiation therapy: Secondary | ICD-10-CM | POA: Diagnosis not present

## 2018-01-04 DIAGNOSIS — C8205 Follicular lymphoma grade I, lymph nodes of inguinal region and lower limb: Secondary | ICD-10-CM | POA: Diagnosis not present

## 2018-01-04 LAB — CBC
HEMATOCRIT: 35.5 % (ref 35.0–47.0)
HEMOGLOBIN: 11.6 g/dL — AB (ref 12.0–16.0)
MCH: 28.7 pg (ref 26.0–34.0)
MCHC: 32.7 g/dL (ref 32.0–36.0)
MCV: 87.9 fL (ref 80.0–100.0)
Platelets: 312 10*3/uL (ref 150–440)
RBC: 4.04 MIL/uL (ref 3.80–5.20)
RDW: 14.1 % (ref 11.5–14.5)
WBC: 6.6 10*3/uL (ref 3.6–11.0)

## 2018-01-05 ENCOUNTER — Ambulatory Visit
Admission: RE | Admit: 2018-01-05 | Discharge: 2018-01-05 | Disposition: A | Discharge status: Home / Self Care | Payer: Medicare Other | Source: Ambulatory Visit | Attending: Radiation Oncology | Admitting: Radiation Oncology

## 2018-01-05 DIAGNOSIS — Z510 Encounter for antineoplastic radiation therapy: Secondary | ICD-10-CM | POA: Diagnosis not present

## 2018-01-06 ENCOUNTER — Ambulatory Visit
Admission: RE | Admit: 2018-01-06 | Discharge: 2018-01-06 | Disposition: A | Discharge status: Home / Self Care | Payer: Medicare Other | Source: Ambulatory Visit | Attending: Radiation Oncology | Admitting: Radiation Oncology

## 2018-01-06 DIAGNOSIS — Z510 Encounter for antineoplastic radiation therapy: Secondary | ICD-10-CM | POA: Diagnosis not present

## 2018-01-09 ENCOUNTER — Ambulatory Visit
Admission: RE | Admit: 2018-01-09 | Discharge: 2018-01-09 | Disposition: A | Discharge status: Home / Self Care | Payer: Medicare Other | Source: Ambulatory Visit | Attending: Radiation Oncology | Admitting: Radiation Oncology

## 2018-01-09 DIAGNOSIS — Z510 Encounter for antineoplastic radiation therapy: Secondary | ICD-10-CM | POA: Diagnosis not present

## 2018-01-10 ENCOUNTER — Ambulatory Visit
Admission: RE | Admit: 2018-01-10 | Discharge: 2018-01-10 | Disposition: A | Discharge status: Home / Self Care | Payer: Medicare Other | Source: Ambulatory Visit | Attending: Radiation Oncology | Admitting: Radiation Oncology

## 2018-01-10 DIAGNOSIS — Z510 Encounter for antineoplastic radiation therapy: Secondary | ICD-10-CM | POA: Diagnosis not present

## 2018-01-11 ENCOUNTER — Inpatient Hospital Stay: Payer: Medicare Other

## 2018-01-11 ENCOUNTER — Ambulatory Visit
Admission: RE | Admit: 2018-01-11 | Discharge: 2018-01-11 | Disposition: A | Discharge status: Home / Self Care | Payer: Medicare Other | Source: Ambulatory Visit | Attending: Radiation Oncology | Admitting: Radiation Oncology

## 2018-01-11 DIAGNOSIS — Z510 Encounter for antineoplastic radiation therapy: Secondary | ICD-10-CM | POA: Diagnosis not present

## 2018-01-11 DIAGNOSIS — C8205 Follicular lymphoma grade I, lymph nodes of inguinal region and lower limb: Secondary | ICD-10-CM

## 2018-01-11 LAB — CBC
HEMATOCRIT: 38.1 % (ref 35.0–47.0)
Hemoglobin: 12.4 g/dL (ref 12.0–16.0)
MCH: 28.7 pg (ref 26.0–34.0)
MCHC: 32.5 g/dL (ref 32.0–36.0)
MCV: 88.5 fL (ref 80.0–100.0)
Platelets: 321 10*3/uL (ref 150–440)
RBC: 4.3 MIL/uL (ref 3.80–5.20)
RDW: 14.1 % (ref 11.5–14.5)
WBC: 6.2 10*3/uL (ref 3.6–11.0)

## 2018-01-12 ENCOUNTER — Ambulatory Visit
Admission: RE | Admit: 2018-01-12 | Discharge: 2018-01-12 | Disposition: A | Discharge status: Home / Self Care | Payer: Medicare Other | Source: Ambulatory Visit | Attending: Radiation Oncology | Admitting: Radiation Oncology

## 2018-01-12 DIAGNOSIS — Z510 Encounter for antineoplastic radiation therapy: Secondary | ICD-10-CM | POA: Diagnosis not present

## 2018-01-13 ENCOUNTER — Ambulatory Visit
Admission: RE | Admit: 2018-01-13 | Discharge: 2018-01-13 | Disposition: A | Discharge status: Home / Self Care | Payer: Medicare Other | Source: Ambulatory Visit | Attending: Radiation Oncology | Admitting: Radiation Oncology

## 2018-01-13 DIAGNOSIS — Z510 Encounter for antineoplastic radiation therapy: Secondary | ICD-10-CM | POA: Diagnosis not present

## 2018-01-16 ENCOUNTER — Ambulatory Visit
Admission: RE | Admit: 2018-01-16 | Discharge: 2018-01-16 | Disposition: A | Discharge status: Home / Self Care | Payer: Medicare Other | Source: Ambulatory Visit | Attending: Radiation Oncology | Admitting: Radiation Oncology

## 2018-01-16 DIAGNOSIS — Z510 Encounter for antineoplastic radiation therapy: Secondary | ICD-10-CM | POA: Diagnosis not present

## 2018-01-17 ENCOUNTER — Ambulatory Visit: Payer: Medicare Other

## 2018-01-18 ENCOUNTER — Ambulatory Visit
Admission: RE | Admit: 2018-01-18 | Discharge: 2018-01-18 | Disposition: A | Discharge status: Home / Self Care | Payer: Medicare Other | Source: Ambulatory Visit | Attending: Radiation Oncology | Admitting: Radiation Oncology

## 2018-01-18 DIAGNOSIS — Z510 Encounter for antineoplastic radiation therapy: Secondary | ICD-10-CM | POA: Diagnosis not present

## 2018-01-19 ENCOUNTER — Ambulatory Visit
Admission: RE | Admit: 2018-01-19 | Discharge: 2018-01-19 | Disposition: A | Discharge status: Home / Self Care | Payer: Medicare Other | Source: Ambulatory Visit | Attending: Radiation Oncology | Admitting: Radiation Oncology

## 2018-01-19 DIAGNOSIS — Z510 Encounter for antineoplastic radiation therapy: Secondary | ICD-10-CM | POA: Diagnosis not present

## 2018-01-20 ENCOUNTER — Ambulatory Visit
Admission: RE | Admit: 2018-01-20 | Discharge: 2018-01-20 | Disposition: A | Discharge status: Home / Self Care | Payer: Medicare Other | Source: Ambulatory Visit | Attending: Radiation Oncology | Admitting: Radiation Oncology

## 2018-01-20 ENCOUNTER — Ambulatory Visit: Payer: Medicare Other

## 2018-01-20 DIAGNOSIS — Z510 Encounter for antineoplastic radiation therapy: Secondary | ICD-10-CM | POA: Diagnosis present

## 2018-01-20 DIAGNOSIS — C8595 Non-Hodgkin lymphoma, unspecified, lymph nodes of inguinal region and lower limb: Secondary | ICD-10-CM | POA: Insufficient documentation

## 2018-01-23 ENCOUNTER — Ambulatory Visit
Admission: RE | Admit: 2018-01-23 | Discharge: 2018-01-23 | Disposition: A | Discharge status: Home / Self Care | Payer: Medicare Other | Source: Ambulatory Visit | Attending: Radiation Oncology | Admitting: Radiation Oncology

## 2018-01-23 DIAGNOSIS — Z510 Encounter for antineoplastic radiation therapy: Secondary | ICD-10-CM | POA: Diagnosis not present

## 2018-02-04 NOTE — Progress Notes (Signed)
Cardiology Office Note  Date:  02/06/2018   ID:  DEANIA SIGUENZA, DOB [birth year], MRN [id number]  PCP:  Marinda Elk, MD   Chief Complaint  Patient presents with  . Other    6 month follow. Patient states she she was at the eye doctor today and they stated her heart was skipping beats. Meds reviewed verbally     HPI:  Ms. Guynes is an 84 yo woman with  hypertension,  diabetes  GERD  sepsis November 2017,  noted to be in atrial fibrillation with RVR,  Cardioversion on 01/20/2017, back to [facility] Who presents for routine follow-up of her persistent atrial fibrillation  catartact surgery in march Does not need to stop eliquis  Significant stressors discussed with her folicular lymphoma, XRT on right knee  Currently feeling well no complaints no shortness of breath or chest tightness Energy feels good  EKG personally reviewed by myself on todays visit Shows atrial fibrillation with ventricular rate 82 bpm   did not realize she was back in atrial fibrillation No idea when she went back into A. fib  Lab work reviewed with her hba1C 6.4 Cholesterol jumped acutely high 240s typically runs 190s  Tolerating her Cardizem and metoprolol Eliquis is expensive  Other past medical history reviewed  previously in the ED 10/28/16 with generalized weakness, fatigue, fever and chills  found to have sepsis secondary to a urinary source   hypotensive , atrial fibrillation  Echocardiogram done in the hospital   concentric hypertrophy. Systolic function was normal. EF 55% to 60%.   asymptomatic from her atrial fibrillation  Metoprolol and Cardizem added for rate control  Currently taking Eliquis 5mg  bid.    This patients CHA2DS2-VASc Score and unadjusted Ischemic Stroke Rate (% per year) is equal to 7.2 % stroke rate/year from a score of 5  Above score calculated as 1 point each if present [CHF, HTN, DM, Vascular=MI/PAD/Aortic Plaque, Age if 65-74, or Female] Above score  calculated as 2 points each if present [Age > 75, or Stroke/TIA/TE]  Cardioversion on 01/20/2017, back to [facility]   PMH:   has a past medical history of Arthritis, Bruises easily, Diabetes mellitus, GERD (gastroesophageal reflux disease), Hypertension, and Multiple thyroid nodules.  PSH:    Past Surgical History:  Procedure Laterality Date  . CARDIOVERSION N/A 01/20/2017   Procedure: CARDIOVERSION;  Surgeon: Minna Merritts, MD;  Location: [facility] ORS;  Service: Cardiovascular;  Laterality: N/A;    Current Outpatient Medications  Medication Sig Dispense Refill  . apixaban (ELIQUIS) 5 MG TABS tablet Take 1 tablet (5 mg total) by mouth 2 (two) times daily. 60 tablet 2  . CRANBERRY PO Take 2 capsules by mouth daily.     . cyanocobalamin (,VITAMIN B-12,) 1000 MCG/ML injection Inject 1,000 mcg into the muscle every 30 (thirty) days.     Marland Kitchen diltiazem (CARDIZEM CD) 180 MG 24 hr capsule Take 1 capsule (180 mg total) by mouth 2 (two) times daily. (Patient taking differently: Take 360 mg by mouth daily. ) 180 capsule 3  . furosemide (LASIX) 20 MG tablet Take 1 tablet (20 mg total) by mouth daily as needed for fluid or edema (SOB). 30 tablet 5  . metFORMIN (GLUCOPHAGE) 500 MG tablet Take 500 mg by mouth 2 (two) times daily with a meal.    . metoprolol succinate (TOPROL-XL) 100 MG 24 hr tablet Take 1 tablet (100 mg total) by mouth 2 (two) times daily. Take with or immediately following a meal. 60 tablet  1  . Omega-3 Fatty Acids (FISH OIL) 1000 MG CAPS Take 1,000 mg by mouth 2 (two) times daily.    Marland Kitchen omeprazole (PRILOSEC) 20 MG capsule Take 1 capsule (20 mg total) by mouth daily. 30 capsule 6  . potassium chloride SA (K-DUR,KLOR-CON) 20 MEQ tablet Take 1 tablet (20 mEq total) as directed by mouth. Once daily with extra pill 2 days a week (Patient taking differently: Take 20 mEq by mouth See admin instructions. Take 20 MEQ by mouth daily and taking an extra 20 MEQ by mouth on Sunday and Monday) 38 tablet 6  .  triamcinolone cream (KENALOG) 0.1 % Apply 1 application topically 2 (two) times daily.     No current facility-administered medications for this visit.      Allergies:   Patient has no known allergies.   Social History:  The patient  reports that  has never smoked. she has never used smokeless tobacco. She reports that she does not drink alcohol or use drugs.   Family History:   family history includes Breast cancer (age of onset: 17) in her sister; Ovarian cancer in her maternal aunt.    Review of Systems: Review of Systems  Constitutional: Negative.   Respiratory: Negative.   Cardiovascular: Negative.   Gastrointestinal: Negative.   Musculoskeletal: Negative.   Psychiatric/Behavioral: Negative.   All other systems reviewed and are negative.    PHYSICAL EXAM: VS:  BP 140/60 (BP Location: Left Arm, Patient Position: Sitting, Cuff Size: Normal)   Pulse 82   Ht 5\' 6"  (1.676 m)   Wt 172 lb (78 kg)   BMI 27.76 kg/m  , BMI Body mass index is 27.76 kg/m. Constitutional:  oriented to person, place, and time. No distress.  HENT:  Head: Normocephalic and atraumatic.  Eyes:  no discharge. No scleral icterus.  Neck: Normal range of motion. Neck supple. No JVD present.  Cardiovascular: Irregularly irregular , normal heart sounds and intact distal pulses. Exam reveals no gallop and no friction rub. No edema No murmur heard. Pulmonary/Chest: Effort normal and breath sounds normal. No stridor. No respiratory distress.  no wheezes.  no rales.  no tenderness.  Abdominal: Soft.  no distension.  no tenderness.  Musculoskeletal: Normal range of motion.  no  tenderness or deformity.  Neurological:  normal muscle tone. Coordination normal. No atrophy Skin: Skin is warm and dry. No rash noted. not diaphoretic.  Psychiatric:  normal mood and affect. behavior is normal. Thought content normal.   Recent Labs: 03/14/2017: BUN 16; Creatinine, Ser 0.84; Potassium 3.8; Sodium 142 01/11/2018:  Hemoglobin 12.4; Platelets 321    Lipid Panel No results found for: CHOL, HDL, LDLCALC, TRIG    Wt Readings from Last 3 Encounters:  02/06/18 172 lb (78 kg)  08/08/17 162 lb 4 oz (73.6 kg)  05/30/17 166 lb 6.4 oz (75.5 kg)     ASSESSMENT AND PLAN:  Paroxysmal atrial fibrillation (HCC) - Long discussion concerning paroxysmal atrial fibrillation In atrial fibrillation on today's visit documented on EKG Does not know when she went into atrial fibrillation As she is asymptomatic with no restrictions she is thinking she will not proceed with cardioversion.  She will think about it and let us know Stressed importance of staying on her Eliquis 5 twice daily  Essential hypertension Blood pressure is well controlled on today's visit. No changes made to the medications.  Unsteady gait She completed home PT No recent falls Stressed importance of regular exercise  Hyperlipidemia Recommended recheck of her  numbers in 6 months time Suspect possibly lab error given no change in her diabetes numbers or dramatic weight change Fish oils would likely not help and in fact perhaps contraindicated with Eliquis, will act as a blood thinner.  Triglycerides already low    Total encounter time more than 25 minutes  Greater than 50% was spent in counseling and coordination of care with the patient   Disposition:   F/U  12 months   No orders of the defined types were placed in this encounter.    Signed, Esmond Plants, M.D., Ph.D. 02/06/2018  [place], [place]

## 2018-02-06 ENCOUNTER — Ambulatory Visit (INDEPENDENT_AMBULATORY_CARE_PROVIDER_SITE_OTHER): Payer: Medicare Other | Admitting: Cardiovascular Disease

## 2018-02-06 ENCOUNTER — Ambulatory Visit: Payer: Medicare Other | Admitting: Cardiovascular Disease

## 2018-02-06 ENCOUNTER — Encounter: Payer: Self-pay | Admitting: Cardiovascular Disease

## 2018-02-06 VITALS — BP 140/60 | HR 82 | Ht 66.0 in | Wt 172.0 lb

## 2018-02-06 DIAGNOSIS — R2681 Unsteadiness on feet: Secondary | ICD-10-CM | POA: Diagnosis not present

## 2018-02-06 DIAGNOSIS — E118 Type 2 diabetes mellitus with unspecified complications: Secondary | ICD-10-CM

## 2018-02-06 DIAGNOSIS — D649 Anemia, unspecified: Secondary | ICD-10-CM | POA: Diagnosis not present

## 2018-02-06 DIAGNOSIS — Z794 Long term (current) use of insulin: Secondary | ICD-10-CM

## 2018-02-06 DIAGNOSIS — Z7189 Other specified counseling: Secondary | ICD-10-CM | POA: Diagnosis not present

## 2018-02-06 DIAGNOSIS — I10 Essential (primary) hypertension: Secondary | ICD-10-CM | POA: Diagnosis not present

## 2018-02-06 DIAGNOSIS — I480 Paroxysmal atrial fibrillation: Secondary | ICD-10-CM

## 2018-02-06 MED ORDER — POTASSIUM CHLORIDE CRYS ER 20 MEQ PO TBCR: 20.0000 meq | EXTENDED_RELEASE_TABLET | ORAL | 3 refills | Status: DC

## 2018-02-06 MED ORDER — APIXABAN 5 MG PO TABS: 5.0000 mg | ORAL_TABLET | Freq: Two times a day (BID) | ORAL | 3 refills | Status: DC

## 2018-02-06 NOTE — Patient Instructions (Signed)
Medication Instructions:   Please hold the fish oil  Labwork:  No new labs needed  Testing/Procedures:  No further testing at this time   Follow-Up: It was a pleasure seeing you in the office today. Please call us if you have new issues that need to be addressed before your next appt.  [phone]  Your physician wants you to follow-up in: 12 months.  You will receive a reminder letter in the mail two months in advance. If you don't receive a letter, please call our office to schedule the follow-up appointment.  If you need a refill on your cardiac medications before your next appointment, please call your pharmacy.  For educational health videos Log in to : [email or web address] Or : [email or web address], password : [email or web address]

## 2018-02-07 ENCOUNTER — Encounter: Payer: Self-pay | Admitting: *Deleted

## 2018-02-10 DIAGNOSIS — Z9841 Cataract extraction status, right eye: Secondary | ICD-10-CM

## 2018-02-10 HISTORY — DX: Cataract extraction status, right eye: Z98.41

## 2018-02-15 ENCOUNTER — Ambulatory Visit: Payer: Medicare Other | Admitting: Certified Registered Nurse Anesthetist

## 2018-02-15 ENCOUNTER — Other Ambulatory Visit: Payer: Self-pay

## 2018-02-15 ENCOUNTER — Encounter
Admission: RE | Disposition: A | Discharge status: Home / Self Care | Payer: Self-pay | Source: Ambulatory Visit | Attending: Ophthalmology

## 2018-02-15 ENCOUNTER — Ambulatory Visit
Admission: RE | Admit: 2018-02-15 | Discharge: 2018-02-15 | Disposition: A | Discharge status: Home / Self Care | Payer: Medicare Other | Source: Ambulatory Visit | Attending: Ophthalmology | Admitting: Ophthalmology

## 2018-02-15 DIAGNOSIS — E119 Type 2 diabetes mellitus without complications: Secondary | ICD-10-CM | POA: Insufficient documentation

## 2018-02-15 DIAGNOSIS — I10 Essential (primary) hypertension: Secondary | ICD-10-CM | POA: Diagnosis not present

## 2018-02-15 DIAGNOSIS — Z79899 Other long term (current) drug therapy: Secondary | ICD-10-CM | POA: Insufficient documentation

## 2018-02-15 DIAGNOSIS — Z8614 Personal history of Methicillin resistant Staphylococcus aureus infection: Secondary | ICD-10-CM | POA: Insufficient documentation

## 2018-02-15 DIAGNOSIS — Z7902 Long term (current) use of antithrombotics/antiplatelets: Secondary | ICD-10-CM | POA: Insufficient documentation

## 2018-02-15 DIAGNOSIS — K219 Gastro-esophageal reflux disease without esophagitis: Secondary | ICD-10-CM | POA: Diagnosis not present

## 2018-02-15 DIAGNOSIS — H2511 Age-related nuclear cataract, right eye: Secondary | ICD-10-CM | POA: Diagnosis not present

## 2018-02-15 DIAGNOSIS — Z7984 Long term (current) use of oral hypoglycemic drugs: Secondary | ICD-10-CM | POA: Insufficient documentation

## 2018-02-15 DIAGNOSIS — I4891 Unspecified atrial fibrillation: Secondary | ICD-10-CM | POA: Insufficient documentation

## 2018-02-15 HISTORY — DX: Methicillin resistant Staphylococcus aureus infection, unspecified site: A49.02

## 2018-02-15 HISTORY — DX: Non-Hodgkin lymphoma, unspecified, unspecified site: C85.90

## 2018-02-15 HISTORY — DX: Edema, unspecified: R60.9

## 2018-02-15 HISTORY — DX: Cardiac arrhythmia, unspecified: I49.9

## 2018-02-15 HISTORY — PX: CATARACT EXTRACTION W/PHACO: SHX586

## 2018-02-15 LAB — GLUCOSE, CAPILLARY: GLUCOSE-CAPILLARY: 145 mg/dL — AB (ref 65–99)

## 2018-02-15 SURGERY — PHACOEMULSIFICATION, CATARACT, WITH IOL INSERTION
Anesthesia: Monitor Anesthesia Care | Site: Eye | Laterality: Right | Wound class: Clean

## 2018-02-15 MED ORDER — EPINEPHRINE PF 1 MG/ML IJ SOLN
INTRAOCULAR | Status: DC | PRN
  Administered 2018-02-15: 07:00:00 via OPHTHALMIC

## 2018-02-15 MED ORDER — CARBACHOL 0.01 % IO SOLN
INTRAOCULAR | Status: DC | PRN
  Administered 2018-02-15: 0.5 mL via INTRAOCULAR

## 2018-02-15 MED ORDER — ARMC OPHTHALMIC DILATING DROPS
1.0000 "application " | OPHTHALMIC | Status: AC
  Administered 2018-02-15 (×3): 1 via OPHTHALMIC

## 2018-02-15 MED ORDER — MOXIFLOXACIN HCL 0.5 % OP SOLN
OPHTHALMIC | Status: DC | PRN
  Administered 2018-02-15: 0.2 mL via OPHTHALMIC

## 2018-02-15 MED ORDER — MOXIFLOXACIN HCL 0.5 % OP SOLN
OPHTHALMIC | Status: AC
  Filled 2018-02-15: qty 3

## 2018-02-15 MED ORDER — ARMC OPHTHALMIC DILATING DROPS
OPHTHALMIC | Status: AC
  Administered 2018-02-15: 1 via OPHTHALMIC
  Filled 2018-02-15: qty 0.4

## 2018-02-15 MED ORDER — POVIDONE-IODINE 5 % OP SOLN
OPHTHALMIC | Status: AC
  Filled 2018-02-15: qty 30

## 2018-02-15 MED ORDER — NA CHONDROIT SULF-NA HYALURON 40-17 MG/ML IO SOLN
INTRAOCULAR | Status: DC | PRN
  Administered 2018-02-15: 1 mL via INTRAOCULAR

## 2018-02-15 MED ORDER — MIDAZOLAM HCL 2 MG/2ML IJ SOLN
INTRAMUSCULAR | Status: AC
  Filled 2018-02-15: qty 2

## 2018-02-15 MED ORDER — NA CHONDROIT SULF-NA HYALURON 40-17 MG/ML IO SOLN
INTRAOCULAR | Status: AC
  Filled 2018-02-15: qty 1

## 2018-02-15 MED ORDER — MOXIFLOXACIN HCL 0.5 % OP SOLN: 1.0000 [drp] | OPHTHALMIC | Status: DC | PRN

## 2018-02-15 MED ORDER — MIDAZOLAM HCL 2 MG/2ML IJ SOLN
INTRAMUSCULAR | Status: DC | PRN
  Administered 2018-02-15: 0.5 mg via INTRAVENOUS

## 2018-02-15 MED ORDER — LIDOCAINE HCL (PF) 4 % IJ SOLN
INTRAMUSCULAR | Status: AC
  Filled 2018-02-15: qty 5

## 2018-02-15 MED ORDER — BSS IO SOLN
INTRAOCULAR | Status: DC | PRN
  Administered 2018-02-15: 4 mL via OPHTHALMIC

## 2018-02-15 MED ORDER — POVIDONE-IODINE 5 % OP SOLN
OPHTHALMIC | Status: DC | PRN
  Administered 2018-02-15: 1 via OPHTHALMIC

## 2018-02-15 MED ORDER — SODIUM CHLORIDE 0.9 % IV SOLN
INTRAVENOUS | Status: DC
  Administered 2018-02-15: 06:00:00 via INTRAVENOUS

## 2018-02-15 MED ORDER — EPINEPHRINE PF 1 MG/ML IJ SOLN
INTRAMUSCULAR | Status: AC
  Filled 2018-02-15: qty 2

## 2018-02-15 SURGICAL SUPPLY — 16 items
GLOVE BIO SURGEON STRL SZ8 (GLOVE) ×3 IMPLANT
GLOVE BIOGEL M 6.5 STRL (GLOVE) ×3 IMPLANT
GLOVE SURG LX 8.0 MICRO (GLOVE) ×2
GLOVE SURG LX STRL 8.0 MICRO (GLOVE) ×1 IMPLANT
GOWN STRL REUS W/ TWL LRG LVL3 (GOWN DISPOSABLE) ×2 IMPLANT
GOWN STRL REUS W/TWL LRG LVL3 (GOWN DISPOSABLE) ×4
LABEL CATARACT MEDS ST (LABEL) ×3 IMPLANT
LENS IOL TECNIS ITEC 23.5 (Intraocular Lens) ×3 IMPLANT
PACK CATARACT (MISCELLANEOUS) ×3 IMPLANT
PACK CATARACT BRASINGTON LX (MISCELLANEOUS) ×3 IMPLANT
PACK EYE AFTER SURG (MISCELLANEOUS) ×3 IMPLANT
SOL BSS BAG (MISCELLANEOUS) ×3
SOLUTION BSS BAG (MISCELLANEOUS) ×1 IMPLANT
SYR 5ML LL (SYRINGE) ×3 IMPLANT
WATER STERILE IRR 250ML POUR (IV SOLUTION) ×3 IMPLANT
WIPE NON LINTING 3.25X3.25 (MISCELLANEOUS) ×3 IMPLANT

## 2018-02-15 NOTE — Transfer of Care (Signed)
Immediate Anesthesia Transfer of Care Note  Patient: Barbara Mooney  Procedure(s) Performed: CATARACT EXTRACTION PHACO AND INTRAOCULAR LENS PLACEMENT (IOC) (Right Eye)  Patient Location: Short Stay  Anesthesia Type:MAC  Level of Consciousness: awake, alert , oriented and patient cooperative  Airway & Oxygen Therapy: Patient Spontanous Breathing  Post-op Assessment: Report given to RN and Post -op Vital signs reviewed and stable  Post vital signs: Reviewed and stable  Last Vitals:  Vitals:   02/15/18 0553  BP: (!) 173/87  Pulse: 77  Resp: 16  Temp: 36.6 C  SpO2: 99%    Last Pain:  Vitals:   02/15/18 0553  TempSrc: Temporal         Complications: No apparent anesthesia complications

## 2018-02-15 NOTE — H&P (Signed)
All labs reviewed. Abnormal studies sent to patients PCP when indicated.  Previous H&P reviewed, patient examined, there are NO CHANGES.  Barbara Kassim Porfilio3/6/20197:10 AM

## 2018-02-15 NOTE — Discharge Instructions (Signed)
Eye Surgery Discharge Instructions  Expect mild scratchy sensation or mild soreness. DO NOT RUB YOUR EYE!  The day of surgery:  Minimal physical activity, but bed rest is not required  No reading, computer work, or close hand work  No bending, lifting, or straining.  May watch TV  For 24 hours:  No driving, legal decisions, or alcoholic beverages  Safety precautions  Eat anything you prefer: It is better to start with liquids, then soup then solid foods.  _____ Eye patch should be worn until postoperative exam tomorrow.  ____ Solar shield eyeglasses should be worn for comfort in the sunlight/patch while sleeping  Resume all regular medications including aspirin or Coumadin if these were discontinued prior to surgery. You may shower, bathe, shave, or wash your hair. Tylenol may be taken for mild discomfort.  Call your doctor if you experience significant pain, nausea, or vomiting, fever > 101 or other signs of infection. [phone] or [phone] Specific instructions:  Follow-up Information    Birder Robson, MD Follow up.   Specialty:  Ophthalmology Why:  02/16/18 at8:25 Contact information: [address] [place] [address] [phone]          Eye Surgery Discharge Instructions  Expect mild scratchy sensation or mild soreness. DO NOT RUB YOUR EYE!  The day of surgery:  Minimal physical activity, but bed rest is not required  No reading, computer work, or close hand work  No bending, lifting, or straining.  May watch TV  For 24 hours:  No driving, legal decisions, or alcoholic beverages  Safety precautions  Eat anything you prefer: It is better to start with liquids, then soup then solid foods.  _____ Eye patch should be worn until postoperative exam tomorrow.  ____ Solar shield eyeglasses should be worn for comfort in the sunlight/patch while sleeping  Resume all regular medications including aspirin or Coumadin if these were  discontinued prior to surgery. You may shower, bathe, shave, or wash your hair. Tylenol may be taken for mild discomfort.  Call your doctor if you experience significant pain, nausea, or vomiting, fever > 101 or other signs of infection. [phone] or [phone] Specific instructions:  Follow-up Information    Birder Robson, MD Follow up.   Specialty:  Ophthalmology Why:  02/16/18 at8:25 Contact information: [place] [address] [phone]

## 2018-02-15 NOTE — Anesthesia Preprocedure Evaluation (Signed)
Anesthesia Evaluation  Patient identified by MRN, date of birth, ID band Patient awake    Reviewed: Allergy & Precautions, NPO status , Patient's Chart, lab work & pertinent test results  Airway Mallampati: II       Dental  (+) Teeth Intact   Pulmonary neg pulmonary ROS,    breath sounds clear to auscultation       Cardiovascular Exercise Tolerance: Good hypertension, Pt. on medications + dysrhythmias Atrial Fibrillation  Rhythm:Irregular Rate:Abnormal     Neuro/Psych negative neurological ROS  negative psych ROS   GI/Hepatic Neg liver ROS, GERD  Medicated,  Endo/Other  diabetes, Type 2, Oral Hypoglycemic Agents  Renal/GU negative Renal ROS     Musculoskeletal  (+) Arthritis , Osteoarthritis,    Abdominal Normal abdominal exam  (+)   Peds negative pediatric ROS (+)  Hematology  (+) anemia ,   Anesthesia Other Findings   Reproductive/Obstetrics                             Anesthesia Physical  Anesthesia Plan  ASA: III  Anesthesia Plan: MAC   Post-op Pain Management:    Induction: Intravenous  PONV Risk Score and Plan:   Airway Management Planned: Nasal Cannula  Additional Equipment:   Intra-op Plan:   Post-operative Plan:   Informed Consent: I have reviewed the patients History and Physical, chart, labs and discussed the procedure including the risks, benefits and alternatives for the proposed anesthesia with the patient or authorized representative who has indicated his/her understanding and acceptance.   Dental advisory given  Plan Discussed with: CRNA and Surgeon  Anesthesia Plan Comments:         Anesthesia Quick Evaluation

## 2018-02-15 NOTE — Anesthesia Post-op Follow-up Note (Signed)
Anesthesia QCDR form completed.        

## 2018-02-15 NOTE — Op Note (Signed)
PREOPERATIVE DIAGNOSIS:  Nuclear sclerotic cataract of the right eye.   POSTOPERATIVE DIAGNOSIS:  NUCLEAR SCLEROTIC CATARACT RIGHT EYE   OPERATIVE PROCEDURE: Procedure(s): CATARACT EXTRACTION PHACO AND INTRAOCULAR LENS PLACEMENT (IOC)   SURGEON:  Birder Robson, MD.   ANESTHESIA:  Anesthesiologist: Alvin Critchley, MD CRNA: Eben Burow, CRNA  1.      Managed anesthesia care. 2.      0.33ml of Shugarcaine was instilled in the eye following the paracentesis.   COMPLICATIONS:  None.   TECHNIQUE:   Stop and chop   DESCRIPTION OF PROCEDURE:  The patient was examined and consented in the preoperative holding area where the aforementioned topical anesthesia was applied to the right eye and then brought back to the Operating Room where the right eye was prepped and draped in the usual sterile ophthalmic fashion and a lid speculum was placed. A paracentesis was created with the side port blade and the anterior chamber was filled with viscoelastic. A near clear corneal incision was performed with the steel keratome. A continuous curvilinear capsulorrhexis was performed with a cystotome followed by the capsulorrhexis forceps. Hydrodissection and hydrodelineation were carried out with BSS on a blunt cannula. The lens was removed in a stop and chop  technique and the remaining cortical material was removed with the irrigation-aspiration handpiece. The capsular bag was inflated with viscoelastic and the Technis [id number]  lens was placed in the capsular bag without complication. The remaining viscoelastic was removed from the eye with the irrigation-aspiration handpiece. The wounds were hydrated. The anterior chamber was flushed with Miostat and the eye was inflated to physiologic pressure. 0.38ml of Vigamox was placed in the anterior chamber. The wounds were found to be water tight. The eye was dressed with Vigamox. The patient was given protective glasses to wear throughout the day and a shield with which to  sleep tonight. The patient was also given drops with which to begin a drop regimen today and will follow-up with me in one day. Implant Name Type Inv. Item Serial No. Manufacturer Lot No. LRB No. Used  LENS IOL DIOP 23.5 - [id number] Intraocular Lens LENS IOL DIOP 23.5 [id number] AMO  Right 1   Procedure(s) with comments: CATARACT EXTRACTION PHACO AND INTRAOCULAR LENS PLACEMENT (IOC) (Right) - [place] 01:22.8 AP% 18.9 CDE 15.62 Fluid Pack Lot # [id number]  Electronically signed: Birder Robson 02/15/2018 7:35 AM

## 2018-02-15 NOTE — Anesthesia Postprocedure Evaluation (Signed)
Anesthesia Post Note  Patient: Barbara Mooney  Procedure(s) Performed: CATARACT EXTRACTION PHACO AND INTRAOCULAR LENS PLACEMENT (IOC) (Right Eye)  Patient location during evaluation: PACU Anesthesia Type: MAC Level of consciousness: awake and alert and oriented Pain management: pain level controlled Vital Signs Assessment: post-procedure vital signs reviewed and stable Respiratory status: spontaneous breathing Cardiovascular status: blood pressure returned to baseline Anesthetic complications: no     Last Vitals:  Vitals:   02/15/18 0553 02/15/18 0735  BP: (!) 173/87 (!) 158/83  Pulse: 77 62  Resp: 16 16  Temp: 36.6 C (!) 36 C  SpO2: 99% 100%    Last Pain:  Vitals:   02/15/18 0735  TempSrc: Temporal                 Dinnis Rog

## 2018-02-23 ENCOUNTER — Ambulatory Visit: Payer: Medicare Other | Admitting: Radiation Oncology

## 2018-03-01 ENCOUNTER — Encounter: Payer: Self-pay | Admitting: *Deleted

## 2018-03-01 ENCOUNTER — Ambulatory Visit
Admission: RE | Admit: 2018-03-01 | Discharge: 2018-03-01 | Disposition: A | Discharge status: Home / Self Care | Payer: Medicare Other | Source: Ambulatory Visit | Attending: Radiation Oncology | Admitting: Radiation Oncology

## 2018-03-01 ENCOUNTER — Other Ambulatory Visit: Payer: Self-pay

## 2018-03-01 VITALS — BP 142/62 | Temp 97.4°F | Resp 18 | Wt 171.7 lb

## 2018-03-01 DIAGNOSIS — C8295 Follicular lymphoma, unspecified, lymph nodes of inguinal region and lower limb: Secondary | ICD-10-CM | POA: Diagnosis not present

## 2018-03-01 DIAGNOSIS — Z923 Personal history of irradiation: Secondary | ICD-10-CM | POA: Diagnosis not present

## 2018-03-01 DIAGNOSIS — C8299 Follicular lymphoma, unspecified, extranodal and solid organ sites: Secondary | ICD-10-CM

## 2018-03-01 DIAGNOSIS — C8290 Follicular lymphoma, unspecified, unspecified site: Secondary | ICD-10-CM | POA: Diagnosis present

## 2018-03-01 NOTE — Progress Notes (Signed)
Radiation Oncology Follow up Note  Name: Barbara Mooney   Date:   03/01/2018 MRN:  [id number] DOB: 03-09-1934    This 84 y.o. female presents to the clinic today for one-month follow-up status post radiation therapy to her right knee for stage IA follicular lymphoma.  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 She seen today in routine follow-up and is doing well she states the pain in her right knee is markedly improved. She specifically denies that decreased range of motion or any other areas of adenopathy..she has recently had nuclear sclerotic cataract of the right eye with intraocular lens replacement.  COMPLICATIONS OF TREATMENT: none  FOLLOW UP COMPLIANCE: keeps appointments   PHYSICAL EXAM:  BP (!) 142/62   Temp (!) 97.4 F (36.3 C)   Resp 18   Wt 171 lb 11.8 oz (77.9 kg)   BMI 27.72 kg/m  Range of motion the right knee does not elicit pain. She is ambulating well proprioception is intact no other evidence of inguinal adenopathy or other areas of adenopathy are noted. Well-developed well-nourished patient in NAD. HEENT reveals PERLA, EOMI, discs not visualized.  Oral cavity is clear. No oral mucosal lesions are identified. Neck is clear without evidence of cervical or supraclavicular adenopathy. Lungs are clear to A&P. Cardiac examination is essentially unremarkable with regular rate and rhythm without murmur rub or thrill. Abdomen is benign with no organomegaly or masses noted. Motor sensory and DTR levels are equal and symmetric in the upper and lower extremities. Cranial nerves II through XII are grossly intact. Proprioception is intact. No peripheral adenopathy or edema is identified. No motor or sensory levels are noted. Crude visual fields are within normal  range.  RADIOLOGY RESULTS: no current films for review  PLAN: present time she is doing well. She may forego knee replacement at this time that is to be decided by her orthopedic surgeon based on the marked relief in pain in her right knee. I have asked to see her back one more time and6 months for follow-up and then will discontinue follow-up care. Patient is to call with any concerns.  I would like to take this opportunity to thank you for allowing me to participate in the care of your patient.Noreene Filbert, MD

## 2018-03-08 ENCOUNTER — Ambulatory Visit
Admission: RE | Admit: 2018-03-08 | Discharge: 2018-03-08 | Disposition: A | Discharge status: Home / Self Care | Payer: Medicare Other | Source: Ambulatory Visit | Attending: Ophthalmology | Admitting: Ophthalmology

## 2018-03-08 ENCOUNTER — Ambulatory Visit: Payer: Medicare Other | Admitting: Anesthesiology

## 2018-03-08 ENCOUNTER — Encounter
Admission: RE | Disposition: A | Discharge status: Home / Self Care | Payer: Self-pay | Source: Ambulatory Visit | Attending: Ophthalmology

## 2018-03-08 ENCOUNTER — Encounter: Payer: Self-pay | Admitting: *Deleted

## 2018-03-08 DIAGNOSIS — I10 Essential (primary) hypertension: Secondary | ICD-10-CM | POA: Insufficient documentation

## 2018-03-08 DIAGNOSIS — M1990 Unspecified osteoarthritis, unspecified site: Secondary | ICD-10-CM | POA: Diagnosis not present

## 2018-03-08 DIAGNOSIS — Z8614 Personal history of Methicillin resistant Staphylococcus aureus infection: Secondary | ICD-10-CM | POA: Diagnosis not present

## 2018-03-08 DIAGNOSIS — M7989 Other specified soft tissue disorders: Secondary | ICD-10-CM | POA: Insufficient documentation

## 2018-03-08 DIAGNOSIS — Z8572 Personal history of non-Hodgkin lymphomas: Secondary | ICD-10-CM | POA: Diagnosis not present

## 2018-03-08 DIAGNOSIS — I4891 Unspecified atrial fibrillation: Secondary | ICD-10-CM | POA: Diagnosis not present

## 2018-03-08 DIAGNOSIS — D649 Anemia, unspecified: Secondary | ICD-10-CM | POA: Insufficient documentation

## 2018-03-08 DIAGNOSIS — E1136 Type 2 diabetes mellitus with diabetic cataract: Secondary | ICD-10-CM | POA: Diagnosis not present

## 2018-03-08 DIAGNOSIS — Z9841 Cataract extraction status, right eye: Secondary | ICD-10-CM | POA: Diagnosis not present

## 2018-03-08 DIAGNOSIS — K219 Gastro-esophageal reflux disease without esophagitis: Secondary | ICD-10-CM | POA: Insufficient documentation

## 2018-03-08 DIAGNOSIS — H2512 Age-related nuclear cataract, left eye: Secondary | ICD-10-CM | POA: Insufficient documentation

## 2018-03-08 HISTORY — DX: Hypothyroidism, unspecified: E03.9

## 2018-03-08 HISTORY — PX: CATARACT EXTRACTION W/PHACO: SHX586

## 2018-03-08 LAB — GLUCOSE, CAPILLARY: Glucose-Capillary: 118 mg/dL — ABNORMAL HIGH (ref 65–99)

## 2018-03-08 SURGERY — PHACOEMULSIFICATION, CATARACT, WITH IOL INSERTION
Anesthesia: General | Site: Eye | Laterality: Left | Wound class: "Clean "

## 2018-03-08 MED ORDER — POVIDONE-IODINE 5 % OP SOLN
OPHTHALMIC | Status: DC | PRN
  Administered 2018-03-08: 1 via OPHTHALMIC

## 2018-03-08 MED ORDER — LIDOCAINE HCL (PF) 4 % IJ SOLN
INTRAOCULAR | Status: DC | PRN
  Administered 2018-03-08: 2 mL via OPHTHALMIC

## 2018-03-08 MED ORDER — MIDAZOLAM HCL 2 MG/2ML IJ SOLN
INTRAMUSCULAR | Status: AC
  Filled 2018-03-08: qty 2

## 2018-03-08 MED ORDER — FENTANYL CITRATE (PF) 100 MCG/2ML IJ SOLN: 25.0000 ug | INTRAMUSCULAR | Status: DC | PRN

## 2018-03-08 MED ORDER — MOXIFLOXACIN HCL 0.5 % OP SOLN
OPHTHALMIC | Status: AC
  Filled 2018-03-08: qty 3

## 2018-03-08 MED ORDER — ONDANSETRON HCL 4 MG/2ML IJ SOLN: 4.0000 mg | Freq: Once | INTRAMUSCULAR | Status: DC | PRN

## 2018-03-08 MED ORDER — POVIDONE-IODINE 5 % OP SOLN
OPHTHALMIC | Status: AC
  Filled 2018-03-08: qty 30

## 2018-03-08 MED ORDER — GLYCOPYRROLATE 0.2 MG/ML IJ SOLN
INTRAMUSCULAR | Status: DC | PRN
  Administered 2018-03-08: 0.1 mg via INTRAVENOUS

## 2018-03-08 MED ORDER — GLYCOPYRROLATE 0.2 MG/ML IJ SOLN
INTRAMUSCULAR | Status: AC
  Filled 2018-03-08: qty 1

## 2018-03-08 MED ORDER — EPINEPHRINE PF 1 MG/ML IJ SOLN
INTRAOCULAR | Status: DC | PRN
  Administered 2018-03-08: 1 mL via OPHTHALMIC

## 2018-03-08 MED ORDER — NA CHONDROIT SULF-NA HYALURON 40-17 MG/ML IO SOLN
INTRAOCULAR | Status: AC
  Filled 2018-03-08: qty 1

## 2018-03-08 MED ORDER — FENTANYL CITRATE (PF) 100 MCG/2ML IJ SOLN
INTRAMUSCULAR | Status: DC | PRN
  Administered 2018-03-08: 50 ug via INTRAVENOUS
  Administered 2018-03-08: 25 ug via INTRAVENOUS

## 2018-03-08 MED ORDER — SODIUM CHLORIDE 0.9 % IV SOLN
INTRAVENOUS | Status: DC
  Administered 2018-03-08: 10:00:00 via INTRAVENOUS

## 2018-03-08 MED ORDER — ARMC OPHTHALMIC DILATING DROPS
1.0000 "application " | OPHTHALMIC | Status: AC
  Administered 2018-03-08: 09:00:00 via OPHTHALMIC
  Administered 2018-03-08: 1 via OPHTHALMIC

## 2018-03-08 MED ORDER — CARBACHOL 0.01 % IO SOLN
INTRAOCULAR | Status: DC | PRN
  Administered 2018-03-08: .5 mL via INTRAOCULAR

## 2018-03-08 MED ORDER — LIDOCAINE HCL (PF) 4 % IJ SOLN
INTRAMUSCULAR | Status: AC
  Filled 2018-03-08: qty 5

## 2018-03-08 MED ORDER — DIPHENHYDRAMINE HCL 50 MG/ML IJ SOLN
INTRAMUSCULAR | Status: AC
  Filled 2018-03-08: qty 1

## 2018-03-08 MED ORDER — MOXIFLOXACIN HCL 0.5 % OP SOLN: 1.0000 [drp] | OPHTHALMIC | Status: DC | PRN

## 2018-03-08 MED ORDER — MIDAZOLAM HCL 2 MG/2ML IJ SOLN
INTRAMUSCULAR | Status: DC | PRN
  Administered 2018-03-08: 2 mg via INTRAVENOUS

## 2018-03-08 MED ORDER — MOXIFLOXACIN HCL 0.5 % OP SOLN
OPHTHALMIC | Status: DC | PRN
  Administered 2018-03-08: .2 mL via OPHTHALMIC

## 2018-03-08 MED ORDER — EPINEPHRINE PF 1 MG/ML IJ SOLN
INTRAMUSCULAR | Status: AC
  Filled 2018-03-08: qty 1

## 2018-03-08 MED ORDER — DIPHENHYDRAMINE HCL 50 MG/ML IJ SOLN
INTRAMUSCULAR | Status: DC | PRN
  Administered 2018-03-08: 12.5 mg via INTRAVENOUS

## 2018-03-08 MED ORDER — NA CHONDROIT SULF-NA HYALURON 40-17 MG/ML IO SOLN
INTRAOCULAR | Status: DC | PRN
  Administered 2018-03-08: 1 mL via INTRAOCULAR

## 2018-03-08 MED ORDER — FENTANYL CITRATE (PF) 100 MCG/2ML IJ SOLN
INTRAMUSCULAR | Status: AC
  Filled 2018-03-08: qty 2

## 2018-03-08 MED ORDER — ARMC OPHTHALMIC DILATING DROPS
OPHTHALMIC | Status: AC
  Filled 2018-03-08: qty 0.4

## 2018-03-08 SURGICAL SUPPLY — 16 items
GLOVE BIO SURGEON STRL SZ8 (GLOVE) ×3 IMPLANT
GLOVE BIOGEL M 6.5 STRL (GLOVE) ×3 IMPLANT
GLOVE SURG LX 8.0 MICRO (GLOVE) ×2
GLOVE SURG LX STRL 8.0 MICRO (GLOVE) ×1 IMPLANT
GOWN STRL REUS W/ TWL LRG LVL3 (GOWN DISPOSABLE) ×2 IMPLANT
GOWN STRL REUS W/TWL LRG LVL3 (GOWN DISPOSABLE) ×4
LABEL CATARACT MEDS ST (LABEL) ×3 IMPLANT
LENS IOL TECNIS ITEC 23.5 (Intraocular Lens) ×2 IMPLANT
PACK CATARACT (MISCELLANEOUS) ×3 IMPLANT
PACK CATARACT BRASINGTON LX (MISCELLANEOUS) ×3 IMPLANT
PACK EYE AFTER SURG (MISCELLANEOUS) ×3 IMPLANT
SOL BSS BAG (MISCELLANEOUS) ×3
SOLUTION BSS BAG (MISCELLANEOUS) ×1 IMPLANT
SYR 5ML LL (SYRINGE) ×3 IMPLANT
WATER STERILE IRR 250ML POUR (IV SOLUTION) ×3 IMPLANT
WIPE NON LINTING 3.25X3.25 (MISCELLANEOUS) ×3 IMPLANT

## 2018-03-08 NOTE — Op Note (Signed)
PREOPERATIVE DIAGNOSIS:  Nuclear sclerotic cataract of the left eye.   POSTOPERATIVE DIAGNOSIS:  Nuclear sclerotic cataract of the left eye.   OPERATIVE PROCEDURE: Procedure(s): CATARACT EXTRACTION PHACO AND INTRAOCULAR LENS PLACEMENT (IOC)   SURGEON:  Birder Robson, MD.   ANESTHESIA:  Anesthesiologist: Alphonsus Sias, MD CRNA: Nile Riggs, CRNA  1.      Managed anesthesia care. 2.     0.68ml of Shugarcaine was instilled following the paracentesis   COMPLICATIONS:  None.   TECHNIQUE:   Stop and chop   DESCRIPTION OF PROCEDURE:  The patient was examined and consented in the preoperative holding area where the aforementioned topical anesthesia was applied to the left eye and then brought back to the Operating Room where the left eye was prepped and draped in the usual sterile ophthalmic fashion and a lid speculum was placed. A paracentesis was created with the side port blade and the anterior chamber was filled with viscoelastic. A near clear corneal incision was performed with the steel keratome. A continuous curvilinear capsulorrhexis was performed with a cystotome followed by the capsulorrhexis forceps. Hydrodissection and hydrodelineation were carried out with BSS on a blunt cannula. The lens was removed in a stop and chop  technique and the remaining cortical material was removed with the irrigation-aspiration handpiece. The capsular bag was inflated with viscoelastic and the Technis [id number] lens was placed in the capsular bag without complication. The remaining viscoelastic was removed from the eye with the irrigation-aspiration handpiece. The wounds were hydrated. The anterior chamber was flushed with Miostat and the eye was inflated to physiologic pressure. 0.77ml Vigamox was placed in the anterior chamber. The wounds were found to be water tight. The eye was dressed with Vigamox. The patient was given protective glasses to wear throughout the day and a shield with which to sleep  tonight. The patient was also given drops with which to begin a drop regimen today and will follow-up with me in one day. Implant Name Type Inv. Item Serial No. Manufacturer Lot No. LRB No. Used  LENS IOL DIOP 23.5 - [id number] Intraocular Lens LENS IOL DIOP 23.5 [id number] AMO  Left 1    Procedure(s) with comments: CATARACT EXTRACTION PHACO AND INTRAOCULAR LENS PLACEMENT (IOC) (Left) - [place] 01:05 AP% 15.8 CDE 10.28 Fluid pak lot # [id number]  Electronically signed: Birder Robson 03/08/2018 10:49 AM

## 2018-03-08 NOTE — Anesthesia Postprocedure Evaluation (Signed)
Anesthesia Post Note  Patient: Barbara Mooney  Procedure(s) Performed: CATARACT EXTRACTION PHACO AND INTRAOCULAR LENS PLACEMENT (IOC) (Left Eye)  Patient location during evaluation: Endoscopy Anesthesia Type: General Level of consciousness: awake and alert Pain management: pain level controlled Vital Signs Assessment: post-procedure vital signs reviewed and stable Respiratory status: spontaneous breathing, nonlabored ventilation and respiratory function stable Cardiovascular status: blood pressure returned to baseline and stable Postop Assessment: no apparent nausea or vomiting Anesthetic complications: no     Last Vitals:  Vitals:   03/08/18 1051 03/08/18 1053  BP: (!) 162/66 (!) 144/63  Pulse: 60   Resp: 12 16  Temp: (!) 36.3 C (!) 36.3 C  SpO2: 99% 98%    Last Pain:  Vitals:   03/08/18 1051  TempSrc: Temporal  PainSc: 0-No pain                 Alphonsus Sias

## 2018-03-08 NOTE — H&P (Signed)
All labs reviewed. Abnormal studies sent to patients PCP when indicated.  Previous H&P reviewed, patient examined, there are NO CHANGES.  Steffany Schoenfelder Porfilio3/27/201910:22 AM

## 2018-03-08 NOTE — Anesthesia Preprocedure Evaluation (Signed)
Anesthesia Evaluation  Patient identified by MRN, date of birth, ID band Patient awake    Reviewed: Allergy & Precautions, H&P , NPO status , reviewed documented beta blocker date and time   Airway Mallampati: II  TM Distance: >3 FB     Dental  (+) Teeth Intact   Pulmonary neg pulmonary ROS,    Pulmonary exam normal        Cardiovascular hypertension, Normal cardiovascular exam+ dysrhythmias Atrial Fibrillation      Neuro/Psych negative neurological ROS  negative psych ROS   GI/Hepatic Neg liver ROS, GERD  Controlled,  Endo/Other  diabetesHypothyroidism   Renal/GU negative Renal ROS  negative genitourinary   Musculoskeletal  (+) Arthritis , Osteoarthritis,    Abdominal   Peds  Hematology  (+) anemia ,   Anesthesia Other Findings   Reproductive/Obstetrics                             Anesthesia Physical Anesthesia Plan  ASA: III  Anesthesia Plan: General and MAC   Post-op Pain Management:    Induction:   PONV Risk Score and Plan: Propofol infusion  Airway Management Planned:   Additional Equipment:   Intra-op Plan:   Post-operative Plan:   Informed Consent: I have reviewed the patients History and Physical, chart, labs and discussed the procedure including the risks, benefits and alternatives for the proposed anesthesia with the patient or authorized representative who has indicated his/her understanding and acceptance.   Dental Advisory Given  Plan Discussed with: CRNA  Anesthesia Plan Comments:         Anesthesia Quick Evaluation

## 2018-03-08 NOTE — Anesthesia Post-op Follow-up Note (Signed)
Anesthesia QCDR form completed.        

## 2018-03-08 NOTE — Discharge Instructions (Signed)
Eye Surgery Discharge Instructions  Expect mild scratchy sensation or mild soreness. DO NOT RUB YOUR EYE!  The day of surgery:  Minimal physical activity, but bed rest is not required  No reading, computer work, or close hand work  No bending, lifting, or straining.  May watch TV  For 24 hours:  No driving, legal decisions, or alcoholic beverages  Safety precautions  Eat anything you prefer: It is better to start with liquids, then soup then solid foods.  _____ Eye patch should be worn until postoperative exam tomorrow.  ____ Solar shield eyeglasses should be worn for comfort in the sunlight/patch while sleeping  Resume all regular medications including aspirin or Coumadin if these were discontinued prior to surgery. You may shower, bathe, shave, or wash your hair. Tylenol may be taken for mild discomfort.  Call your doctor if you experience significant pain, nausea, or vomiting, fever > 101 or other signs of infection. [phone] or [phone] Specific instructions:  Follow-up Information    Birder Robson, MD Follow up.   Specialty:  Ophthalmology Why:  March 28 at 10:10am Contact information: [address] [place] [address] [phone]

## 2018-03-08 NOTE — Transfer of Care (Signed)
Immediate Anesthesia Transfer of Care Note  Patient: Barbara Mooney  Procedure(s) Performed: CATARACT EXTRACTION PHACO AND INTRAOCULAR LENS PLACEMENT (IOC) (Left Eye)  Patient Location: PACU and Short Stay  Anesthesia Type:General  Level of Consciousness: awake, alert , oriented and patient cooperative  Airway & Oxygen Therapy: Patient Spontanous Breathing  Post-op Assessment: Report given to RN, Post -op Vital signs reviewed and stable and Patient moving all extremities X 4  Post vital signs: Reviewed and stable  Last Vitals:  Vitals Value Taken Time  BP 162/66 03/08/2018 10:51 AM  Temp 36.3 C 03/08/2018 10:51 AM  Pulse 60 03/08/2018 10:51 AM  Resp 12 03/08/2018 10:51 AM  SpO2 99 % 03/08/2018 10:51 AM    Last Pain:  Vitals:   03/08/18 1051  TempSrc: Temporal  PainSc: 0-No pain         Complications: No apparent anesthesia complications

## 2018-04-03 ENCOUNTER — Other Ambulatory Visit: Payer: Self-pay | Admitting: Physician Assistant

## 2018-04-03 ENCOUNTER — Encounter: Payer: Self-pay | Admitting: Cardiovascular Disease

## 2018-04-03 DIAGNOSIS — Z1231 Encounter for screening mammogram for malignant neoplasm of breast: Secondary | ICD-10-CM

## 2018-04-19 ENCOUNTER — Encounter: Payer: Self-pay | Admitting: Urology

## 2018-04-27 ENCOUNTER — Encounter: Payer: Self-pay | Admitting: Urology

## 2018-04-27 ENCOUNTER — Telehealth: Payer: Self-pay | Admitting: Urology

## 2018-04-27 ENCOUNTER — Ambulatory Visit (INDEPENDENT_AMBULATORY_CARE_PROVIDER_SITE_OTHER): Payer: Medicare Other

## 2018-04-27 VITALS — BP 142/89 | HR 60 | Ht 65.0 in | Wt 171.0 lb

## 2018-04-27 DIAGNOSIS — R310 Gross hematuria: Secondary | ICD-10-CM

## 2018-04-27 LAB — URINALYSIS, COMPLETE
BILIRUBIN UA: NEGATIVE
GLUCOSE, UA: NEGATIVE
Ketones, UA: NEGATIVE
Leukocytes, UA: NEGATIVE
NITRITE UA: NEGATIVE
PH UA: 5.5 (ref 5.0–7.5)
Protein, UA: NEGATIVE
Specific Gravity, UA: 1.015 (ref 1.005–1.030)
Urobilinogen, Ur: 0.2 mg/dL (ref 0.2–1.0)

## 2018-04-27 NOTE — Telephone Encounter (Signed)
Patient's family member called today requesting a nurse visit.  She claimed that patient has blood in her urine and UTI symptoms.  She claims that Larene [facility] has told them to come in when she has symptoms. She states that patient needs to be cathed for the specimen.  Patient arrived in the clinic today at 11am for a nurse visit.

## 2018-04-27 NOTE — Progress Notes (Signed)
In and Out Catheterization  Patient is present today for a I & O catheterization due to blood in urine. Patient was cleaned and prepped in a sterile fashion with betadine and Lidocaine 2% jelly was instilled into the urethra.  A 14FR cath was inserted no complications were noted , 30mlml of urine return was noted, urine was amber in color. A clean urine sample was collected for UA and culture. Bladder was drained  And catheter was removed with out difficulty.    Preformed by: Fonnie Jarvis, CMA  Follow up/ Additional notes: urine today was clear patient notified

## 2018-04-30 LAB — CULTURE, URINE COMPREHENSIVE

## 2018-05-09 ENCOUNTER — Other Ambulatory Visit: Payer: Self-pay | Admitting: Urology

## 2018-05-09 DIAGNOSIS — R310 Gross hematuria: Secondary | ICD-10-CM

## 2018-05-09 NOTE — Progress Notes (Signed)
CTU orders are in.

## 2018-05-10 ENCOUNTER — Telehealth: Payer: Self-pay

## 2018-05-10 ENCOUNTER — Telehealth: Payer: Self-pay | Admitting: Urology

## 2018-05-10 ENCOUNTER — Encounter: Payer: Self-pay | Admitting: Urology

## 2018-05-10 NOTE — Telephone Encounter (Signed)
-----   Message from Nori Riis, PA-C sent at 05/09/2018 11:25 AM EDT ----- Please let [name] know that her urine culture was negative.  She will need to undergo a hematuria work up at this time as she was seeing blood in her urine.  I have placed an order in the chart for the CUT, she will need an appointment for the CTU report and cystoscopy.

## 2018-05-10 NOTE — Telephone Encounter (Signed)
Pt informed, please schedule pt for CTU and Cystoscopy

## 2018-05-10 NOTE — Telephone Encounter (Signed)
Patient is scheduled could not reach her will mail app  Barbara Mooney

## 2018-05-12 ENCOUNTER — Other Ambulatory Visit: Payer: Self-pay | Admitting: Urology

## 2018-05-12 DIAGNOSIS — R3129 Other microscopic hematuria: Secondary | ICD-10-CM

## 2018-05-12 NOTE — Progress Notes (Signed)
RUS orders are in.  

## 2018-05-16 ENCOUNTER — Telehealth: Payer: Self-pay | Admitting: Urology

## 2018-05-16 NOTE — Telephone Encounter (Signed)
Will you cancel her CT scan? We are doing an ultrasound instead.

## 2018-05-16 NOTE — Telephone Encounter (Signed)
I canceled her CT scan and patient has been notified   Sharyn Lull

## 2018-05-18 ENCOUNTER — Ambulatory Visit
Admission: RE | Admit: 2018-05-18 | Discharge: 2018-05-18 | Disposition: A | Discharge status: Home / Self Care | Payer: Medicare Other | Source: Ambulatory Visit | Attending: Physician Assistant | Admitting: Physician Assistant

## 2018-05-18 DIAGNOSIS — Z1231 Encounter for screening mammogram for malignant neoplasm of breast: Secondary | ICD-10-CM | POA: Insufficient documentation

## 2018-05-18 HISTORY — DX: Personal history of irradiation: Z92.3

## 2018-05-25 ENCOUNTER — Ambulatory Visit
Admission: RE | Admit: 2018-05-25 | Discharge: 2018-05-25 | Disposition: A | Discharge status: Home / Self Care | Payer: Medicare Other | Source: Ambulatory Visit | Attending: Urology | Admitting: Urology

## 2018-05-25 ENCOUNTER — Ambulatory Visit: Payer: Medicare Other

## 2018-05-25 DIAGNOSIS — N281 Cyst of kidney, acquired: Secondary | ICD-10-CM | POA: Diagnosis not present

## 2018-05-25 DIAGNOSIS — R3129 Other microscopic hematuria: Secondary | ICD-10-CM | POA: Insufficient documentation

## 2018-05-26 ENCOUNTER — Telehealth: Payer: Self-pay

## 2018-05-26 ENCOUNTER — Telehealth: Payer: Self-pay | Admitting: Urology

## 2018-05-26 NOTE — Telephone Encounter (Signed)
lmom for Barbara Mooney to call office

## 2018-05-26 NOTE — Telephone Encounter (Signed)
-----   Message from Nori Riis, PA-C sent at 05/26/2018  7:46 AM EDT ----- Please let Barbara Mooney daughter know that her renal ultrasound did not show any stones or swelling in her kidneys.  Her left kidney shape is somewhat lobular.  They can discuss the need for further studies at her cystoscopy.

## 2018-05-26 NOTE — Telephone Encounter (Signed)
I read results to pt's daughter and she understands.

## 2018-05-30 ENCOUNTER — Ambulatory Visit: Payer: Medicare Other | Admitting: Urology

## 2018-05-30 ENCOUNTER — Other Ambulatory Visit: Payer: Medicare Other | Admitting: Urology

## 2018-05-31 NOTE — Telephone Encounter (Signed)
Janet informed.

## 2018-06-09 ENCOUNTER — Ambulatory Visit (INDEPENDENT_AMBULATORY_CARE_PROVIDER_SITE_OTHER): Payer: Medicare Other | Admitting: Urology

## 2018-06-09 ENCOUNTER — Encounter: Payer: Self-pay | Admitting: Urology

## 2018-06-09 VITALS — BP 148/72 | HR 55 | Resp 16 | Ht 65.0 in | Wt 167.4 lb

## 2018-06-09 DIAGNOSIS — R310 Gross hematuria: Secondary | ICD-10-CM

## 2018-06-09 DIAGNOSIS — N390 Urinary tract infection, site not specified: Secondary | ICD-10-CM | POA: Diagnosis not present

## 2018-06-09 LAB — URINALYSIS, COMPLETE
Bilirubin, UA: NEGATIVE
GLUCOSE, UA: NEGATIVE
Ketones, UA: NEGATIVE
NITRITE UA: NEGATIVE
PH UA: 7 (ref 5.0–7.5)
Protein, UA: NEGATIVE
SPEC GRAV UA: 1.01 (ref 1.005–1.030)
Urobilinogen, Ur: 0.2 mg/dL (ref 0.2–1.0)

## 2018-06-09 LAB — MICROSCOPIC EXAMINATION

## 2018-06-09 MED ORDER — ESTROGENS, CONJUGATED 0.625 MG/GM VA CREA
1.0000 | TOPICAL_CREAM | Freq: Every day | VAGINAL | 12 refills | Status: DC
Start: 2018-06-09 — End: 2018-06-13

## 2018-06-09 NOTE — Progress Notes (Signed)
   06/09/18  CC:  Chief Complaint  Patient presents with  . Cysto    HPI: 84 year old female with recurrent urinary tract infections and presumed gross hematuria who presents today for cystoscopy for further evaluation.  She underwent renal ultrasound which showed no upper tract pathology including no stones or renal masses.  She does report that when she experiences presumed gross hematuria, she was eating beets and this may be related to the finding.    No microscopic blood in her urine today.  She is a never smoker.  Patient was previously using topical estrogen cream but stopped using this.  She ran out of the medication and did not have a prescription.   Blood pressure (!) 148/72, pulse (!) 55, resp. rate 16, height 5\' 5"  (1.651 m), weight 167 lb 6.4 oz (75.9 kg), SpO2 96 %. NED. A&Ox3.   No respiratory distress   Abd soft, NT, ND Normal external genitalia with patent urethral meatus  Cystoscopy Procedure Note  Patient identification was confirmed, informed consent was obtained, and patient was prepped using Betadine solution.  Lidocaine jelly was administered per urethral meatus.  Mild vaginal atrophy was appreciated.  Preoperative abx where received prior to procedure.    Procedure: - Flexible cystoscope introduced, without any difficulty.   - Thorough search of the bladder revealed:    normal urethral meatus    normal urothelium    no stones    no ulcers     no tumors    no urethral polyps    no trabeculation  - Ureteral orifices were normal in position and appearance.  Post-Procedure: - Patient tolerated the procedure well  Assessment/ Plan:  1. Recurrent UTI No identified bladder pathology or renal pathology renal ultrasound Cystoscopy today unremarkable We discussed strategies for UTI prevention including hygiene, cranberry tablets, probiotics, and resumption of topical estrogen cream We discussed that using a pea-sized amount of topical estrogen  cream 3 times a week has the strongest evidence for UTI prevention of postmenopausal women She was given additional samples today of prescription and sent to pharmacy Anatomy and application was reviewed - Urinalysis, Complete  2. Gross hematuria May be related to consumption of beets No evidence of microscopic blood today No delayed imaging ordered as the patient previously had a CT scan was not willing to undergo CT urogram We discussed the risk of upper tract urothelial carcinoma is quite low thus she understands the risk of possible missed diagnosis If she develops recurrent gross hematuria or microscopic hematuria, may consider in the future  F/u prn  Hollice Espy, MD

## 2018-06-13 ENCOUNTER — Other Ambulatory Visit: Payer: Self-pay | Admitting: Family Medicine

## 2018-06-13 MED ORDER — ESTRADIOL 0.1 MG/GM VA CREA: TOPICAL_CREAM | VAGINAL | 12 refills | Status: DC

## 2018-06-20 ENCOUNTER — Telehealth: Payer: Self-pay | Admitting: Cardiovascular Disease

## 2018-06-20 NOTE — Telephone Encounter (Signed)
Left voicemail message to call back  

## 2018-06-20 NOTE — Telephone Encounter (Signed)
Pt is using premarin vaginal cream, states she has read on the label it could cause heart problems. Please call and discuss.

## 2018-06-21 NOTE — Telephone Encounter (Signed)
To pharm D to review.

## 2018-06-21 NOTE — Telephone Encounter (Signed)
Pt returning our call °Please call back ° °

## 2018-06-21 NOTE — Telephone Encounter (Signed)
Estrogens can increase cardiovascular risk. Generally this is more associated with oral estrogens as vaginal estrogens stay fairly localized to that area. We generally recommend the lowest effective dose of estrogen.

## 2018-06-21 NOTE — Telephone Encounter (Signed)
Dr. Rockey Situ- any recommendations?

## 2018-06-22 NOTE — Telephone Encounter (Signed)
Spoke with patient and reviewed that vaginal estrogen cream is a localized medication and there are cardiac risk factors with use of estrogen in general. Advised that she may want to call who prescribed this medication for her to determine if it is needed and/or potential risks. She was very appreciative for the call back with no further questions at this time.

## 2018-07-05 ENCOUNTER — Encounter: Payer: Self-pay | Admitting: Radiation Oncology

## 2018-07-05 ENCOUNTER — Other Ambulatory Visit: Payer: Self-pay

## 2018-07-05 ENCOUNTER — Ambulatory Visit
Admission: RE | Admit: 2018-07-05 | Discharge: 2018-07-05 | Disposition: A | Discharge status: Home / Self Care | Payer: Medicare Other | Source: Ambulatory Visit | Attending: Radiation Oncology | Admitting: Radiation Oncology

## 2018-07-05 VITALS — BP 145/82 | HR 59 | Temp 97.4°F | Resp 18 | Wt 173.1 lb

## 2018-07-05 DIAGNOSIS — C8299 Follicular lymphoma, unspecified, extranodal and solid organ sites: Secondary | ICD-10-CM

## 2018-07-05 DIAGNOSIS — Z923 Personal history of irradiation: Secondary | ICD-10-CM | POA: Diagnosis not present

## 2018-07-05 DIAGNOSIS — C8585 Other specified types of non-Hodgkin lymphoma, lymph nodes of inguinal region and lower limb: Secondary | ICD-10-CM | POA: Insufficient documentation

## 2018-07-05 NOTE — Progress Notes (Signed)
Radiation Oncology Follow up Note  Name: Barbara Mooney   Date:   07/05/2018 MRN:  [id number] DOB: 20-Sep-1934    This 84 y.o. female presents to the clinic today for  Month follow-up status post revision therapy to her right knee for stage I Efollicular lymphoma.  REFERRING PROVIDER: Marinda Elk, MD  HPI: patient is a 84 year old female now out 6 months having completed radiation therapy for stage I E follicular lymphoma the right knee she is seen today in routine follow-up is doing 1 she's having no knee pain. She is ambulating well..she's had a follow-up PET CT scan at [facility] showing no evidence of hypermetabolic activity consistent with complete response. She specifically denies any new areas of nodularity or mass fever chills or night sweats.  COMPLICATIONS OF TREATMENT: none  FOLLOW UP COMPLIANCE: keeps appointments   PHYSICAL EXAM:  BP (!) 145/82   Pulse (!) 59   Temp (!) 97.4 F (36.3 C)   Resp 18   Wt 173 lb 1 oz (78.5 kg)   BMI 28.80 kg/m  ange of motion of her right knee she does not elicit pain motor sensory and DTR levels are equal and symmetric in the upper and lower extremities. No evidence of adenopathy peripherally is noted.Well-developed well-nourished patient in NAD. HEENT reveals PERLA, EOMI, discs not visualized.  Oral cavity is clear. No oral mucosal lesions are identified. Neck is clear without evidence of cervical or supraclavicular adenopathy. Lungs are clear to A&P. Cardiac examination is essentially unremarkable with regular rate and rhythm without murmur rub or thrill. Abdomen is benign with no organomegaly or masses noted. Motor sensory and DTR levels are equal and symmetric in the upper and lower extremities. Cranial nerves II through XII are grossly intact. Proprioception is intact. No peripheral adenopathy or edema is identified. No motor or sensory levels are noted. Crude visual fields are within normal range.  RADIOLOGY RESULTS: PET CT scan results  reviewed  PLAN: present time she is doing well with no evidence of disease. I've asked to see her back in 6 months for follow-up. A she knows to call sooner with any concerns at any time.  I would like to take this opportunity to thank you for allowing me to participate in the care of your patient.Noreene Filbert, MD

## 2018-08-12 ENCOUNTER — Other Ambulatory Visit: Payer: Self-pay | Admitting: Cardiovascular Disease

## 2018-09-15 ENCOUNTER — Other Ambulatory Visit: Payer: Self-pay

## 2018-09-15 ENCOUNTER — Encounter: Payer: Self-pay | Admitting: Emergency Medicine

## 2018-09-15 ENCOUNTER — Emergency Department
Admission: EM | Admit: 2018-09-15 | Discharge: 2018-09-15 | Disposition: A | Discharge status: Home / Self Care | Payer: Medicare Other | Attending: Emergency Medicine | Admitting: Emergency Medicine

## 2018-09-15 ENCOUNTER — Emergency Department: Payer: Medicare Other

## 2018-09-15 DIAGNOSIS — W01198A Fall on same level from slipping, tripping and stumbling with subsequent striking against other object, initial encounter: Secondary | ICD-10-CM | POA: Insufficient documentation

## 2018-09-15 DIAGNOSIS — Y999 Unspecified external cause status: Secondary | ICD-10-CM | POA: Insufficient documentation

## 2018-09-15 DIAGNOSIS — I10 Essential (primary) hypertension: Secondary | ICD-10-CM | POA: Diagnosis not present

## 2018-09-15 DIAGNOSIS — S52502A Unspecified fracture of the lower end of left radius, initial encounter for closed fracture: Secondary | ICD-10-CM

## 2018-09-15 DIAGNOSIS — Z794 Long term (current) use of insulin: Secondary | ICD-10-CM | POA: Diagnosis not present

## 2018-09-15 DIAGNOSIS — Z79899 Other long term (current) drug therapy: Secondary | ICD-10-CM | POA: Diagnosis not present

## 2018-09-15 DIAGNOSIS — E119 Type 2 diabetes mellitus without complications: Secondary | ICD-10-CM | POA: Insufficient documentation

## 2018-09-15 DIAGNOSIS — Y9389 Activity, other specified: Secondary | ICD-10-CM | POA: Insufficient documentation

## 2018-09-15 DIAGNOSIS — S6991XA Unspecified injury of right wrist, hand and finger(s), initial encounter: Secondary | ICD-10-CM | POA: Diagnosis present

## 2018-09-15 DIAGNOSIS — S52592A Other fractures of lower end of left radius, initial encounter for closed fracture: Secondary | ICD-10-CM | POA: Insufficient documentation

## 2018-09-15 DIAGNOSIS — Y92018 Other place in single-family (private) house as the place of occurrence of the external cause: Secondary | ICD-10-CM | POA: Insufficient documentation

## 2018-09-15 MED ORDER — TRAMADOL HCL 50 MG PO TABS: 50.0000 mg | ORAL_TABLET | Freq: Four times a day (QID) | ORAL | 0 refills | Status: DC | PRN

## 2018-09-15 NOTE — ED Triage Notes (Signed)
Pt reports tripping while walking into the house this morning, fell onto left wrist. Pain and swelling noted, ice applied from home.

## 2018-09-15 NOTE — ED Notes (Signed)
See triage note  States she tripped and fell   Landed on left arm  havingpain and swelling to left wrist area  Good pulses

## 2018-09-15 NOTE — Discharge Instructions (Addendum)
Call make an appointment Dr. Mack Guise who is the orthopedist on call today.  Ice and elevation over the weekend.  You may loosen the Ace wrap should the splint to get tight.  Return to the emergency department if any severe worsening of your symptoms.  You may take Tylenol if needed for pain.  Also tramadol is for pain every 6 hours if needed.  This medication could cause drowsiness and increase her risk for falling.  Wear the sling for elevation when up walking.

## 2018-09-15 NOTE — ED Provider Notes (Signed)
[facility] Emergency Department Provider Note  ____________________________________________   First MD Initiated Contact with Patient 09/15/18 0901     (approximate)  I have reviewed the triage vital signs and the nursing notes.   HISTORY  Chief Complaint Fall and Wrist Injury   HPI [place] B Gomm is a 84 y.o. female presents to the ED with complaint of left wrist pain after falling this morning inside her house.  Patient states that she most likely tripped over her feet.  She denies any syncopal episode, head injury or loss of consciousness.  Patient arrived from home with ice applied to her wrist.  She denies any other injury.  She has taken Tylenol for her pain and rates it as a 3/10.  Past Medical History:  Diagnosis Date  . Arthritis   . Bruises easily   . Diabetes mellitus   . Dysrhythmia    AFIB  . Edema    FEET/ LEGS  . GERD (gastroesophageal reflux disease)   . Hypertension   . Hypothyroidism   . Lymphoma ([place]) 12/2017   RADIATION RIGHT LEG LAST 1 WEEK AGO  . MRSA infection    4 TO 5 YEARS AGO ON BACK  . Multiple thyroid nodules   . Personal history of radiation therapy 12/2017   lymphoma    Patient Active Problem List   Diagnosis Date Noted  . Type 2 diabetes mellitus with complication, with long-term current use of insulin ([place]) 08/07/2017  . Encounter for anticoagulation discussion and counseling 02/08/2017  . Leg swelling 02/08/2017  . Unsteady gait   . Anemia   . Paroxysmal atrial fibrillation (HCC)   . Essential hypertension   . Sepsis ([place]) 10/28/2016  . Multinodular goiter (nontoxic) 09/07/2011    Past Surgical History:  Procedure Laterality Date  . BONE BIOPSY     2018  . CARDIOVERSION N/A 01/20/2017   Procedure: CARDIOVERSION;  Surgeon: Minna Merritts, MD;  Location: [facility] ORS;  Service: Cardiovascular;  Laterality: N/A;  . CARDIOVERSION     AFIB 2018  . CATARACT EXTRACTION W/PHACO Right 02/15/2018   Procedure: CATARACT EXTRACTION PHACO AND INTRAOCULAR LENS PLACEMENT (IOC);  Surgeon: Birder Robson, MD;  Location: [facility] ORS;  Service: Ophthalmology;  Laterality: Right;  [place] 01:22.8 AP% 18.9 CDE 15.62 Fluid Pack Lot # [id number]  . CATARACT EXTRACTION W/PHACO Left 03/08/2018   Procedure: CATARACT EXTRACTION PHACO AND INTRAOCULAR LENS PLACEMENT ([place]);  Surgeon: Birder Robson, MD;  Location: [facility] ORS;  Service: Ophthalmology;  Laterality: Left;  [place] 01:05 AP% 15.8 CDE 10.28 Fluid pak lot # [id number]    Prior to Admission medications   Medication Sig Start Date End Date Taking? Authorizing Provider  acetaminophen (TYLENOL) 500 MG tablet Take 500 mg by mouth at bedtime.    [provider]  apixaban (ELIQUIS) 5 MG TABS tablet Take 1 tablet (5 mg total) by mouth 2 (two) times daily. 02/06/18   Minna Merritts, MD  CRANBERRY PO Take 1 capsule by mouth 2 (two) times daily.     [provider]  cyanocobalamin (,VITAMIN B-12,) 1000 MCG/ML injection Inject 1,000 mcg into the muscle every 30 (thirty) days.  02/08/17   [provider]  diltiazem (CARDIZEM CD) 180 MG 24 hr capsule TAKE 1 CAPSULE BY MOUTH TWICE A DAY 08/15/18   Minna Merritts, MD  estradiol (ESTRACE) 0.1 MG/GM vaginal cream Place 1 Applicatorful vaginally daily. Use pea sized amount M-W-Fr before bedtime 06/13/18   Hollice Espy, MD  furosemide (  LASIX) 20 MG tablet Take 1 tablet (20 mg total) by mouth daily as needed for fluid or edema (SOB). 11/02/16   Dunn, Areta Haber, PA-C  metFORMIN (GLUCOPHAGE) 500 MG tablet Take 500 mg by mouth 2 (two) times daily with a meal.    [provider]  metoprolol succinate (TOPROL-XL) 100 MG 24 hr tablet Take 1 tablet (100 mg total) by mouth 2 (two) times daily. Take with or immediately following a meal. 11/02/16   Demetrios Loll, MD  omeprazole (PRILOSEC) 20 MG capsule Take 1 capsule (20 mg total) by mouth daily. 11/02/16   Bhagat, Crista Luria, PA  potassium chloride SA  (K-DUR,KLOR-CON) 20 MEQ tablet Take 1 tablet (20 mEq total) by mouth as directed. Patient taking differently: Take 20 mEq by mouth daily.  02/06/18   Minna Merritts, MD  traMADol (ULTRAM) 50 MG tablet Take 1 tablet (50 mg total) by mouth every 6 (six) hours as needed for moderate pain. 09/15/18   Johnn Hai, PA-C  triamcinolone cream (KENALOG) 0.1 % Apply 1 application topically 2 (two) times daily.    [provider]    Allergies Patient has no known allergies.  Family History  Problem Relation Age of Onset  . Breast cancer Sister 86  . Ovarian cancer Maternal Aunt   . Kidney cancer Neg Hx   . Bladder Cancer Neg Hx     Social History Social History   Tobacco Use  . Smoking status: Never Smoker  . Smokeless tobacco: Never Used  Substance Use Topics  . Alcohol use: No  . Drug use: No    Review of Systems Constitutional: No fever/chills Eyes: No visual changes. ENT: No trauma. Cardiovascular: Denies chest pain. Respiratory: Denies shortness of breath. Gastrointestinal:  No nausea, no vomiting.  Musculoskeletal: Positive for left wrist pain.  Negative for cervical or back pain. Skin: Negative for rash. Neurological: Negative for headaches, focal weakness or numbness. ___________________________________________   PHYSICAL EXAM:  VITAL SIGNS: ED Triage Vitals  Enc Vitals Group     BP 09/15/18 0859 (!) 165/81     Pulse Rate 09/15/18 0859 60     Resp 09/15/18 0859 18     Temp 09/15/18 0859 97.7 F (36.5 C)     Temp Source 09/15/18 0859 Oral     SpO2 09/15/18 0859 99 %     Weight 09/15/18 0854 170 lb (77.1 kg)     Height 09/15/18 0854 5\' 6"  (1.676 m)     Head Circumference --      Peak Flow --      Pain Score 09/15/18 0854 3     Pain Loc --      Pain Edu? --      Excl. in [place]? --    Constitutional: Alert and oriented. Well appearing and in no acute distress. Eyes: Conjunctivae are normal.  Head: Atraumatic. Nose: No  congestion/rhinnorhea. Neck: No stridor.  No cervical tenderness on palpation posteriorly. Cardiovascular: Normal rate, regular rhythm. Grossly normal heart sounds.  Good peripheral circulation. Respiratory: Normal respiratory effort.  No retractions. Lungs CTAB. Musculoskeletal: Examination of left wrist there is soft tissue edema along with mild deformity of the distal left radius.  Range of motion is restricted secondary to discomfort.  Patient is able to move digits without any difficulty and motor sensory function intact.  Capillary refill is less than 3 seconds.  Radial pulses present.  Skin is intact.  Nontender left forearm, elbow and shoulder. Neurologic:  Normal speech and  language. No gross focal neurologic deficits are appreciated.  Skin:  Skin is warm, dry and intact. No rash noted. Psychiatric: Mood and affect are normal. Speech and behavior are normal.  ____________________________________________   LABS (all labs ordered are listed, but only abnormal results are displayed)  Labs Reviewed - No data to display  RADIOLOGY  ED MD interpretation:  Left wrist x-ray is positive for comminuted distal radial fracture.  Official radiology report(s): Dg Wrist Complete Left  Result Date: 09/15/2018 CLINICAL DATA:  Status post falling backwards. Swelling and pain of the left breast. EXAM: LEFT WRIST - COMPLETE 3+ VIEW COMPARISON:  None. FINDINGS: There is a comminuted impacted distal radial fracture with an intra-articular component at the level of the scaphoid articulation. No significant displacement. Associated soft tissue swelling. The carpal rows are intact. Vascular calcifications are noted. IMPRESSION: Comminuted impacted intra-articular distal radial fracture. Electronically Signed   By: Fidela Salisbury M.D.   On: 09/15/2018 09:21    ____________________________________________   PROCEDURES  Procedure(s) performed:   .Splint Application Date/Time: 24/04/8098 10:26  AM Performed by: Dennie Fetters, NT Authorized by: Johnn Hai, PA-C   Consent:    Consent obtained:  Verbal   Consent given by:  Patient   Risks discussed:  Swelling and pain   Alternatives discussed:  Referral Pre-procedure details:    Sensation:  Normal Procedure details:    Laterality:  Left   Location:  Wrist   Wrist:  L wrist   Strapping: no     Splint type:  Sugar tong   Supplies:  Elastic bandage, Ortho-Glass and sling Post-procedure details:    Pain:  Improved   Sensation:  Normal   Patient tolerance of procedure:  Tolerated well, no immediate complications    Critical Care performed: No  ____________________________________________   INITIAL IMPRESSION / ASSESSMENT AND PLAN / ED COURSE  As part of my medical decision making, I reviewed the following data within the electronic MEDICAL RECORD NUMBER Notes from prior ED visits and [place] Controlled Substance Database  Patient presents to the ED with complaint of left wrist pain after falling inside her home this morning.  Patient denies any head injury or loss of consciousness.  Exam is suspicious for fracture and x-ray confirms there is a comminuted fracture of the distal radius.  Patient was placed in a sugar tong splint and sling.  Patient prefers to take Tylenol for pain however we discussed that this may not be sufficient for her pain over the weekend.  She is also given a prescription for tramadol 50 mg every 6 hours if needed for pain.  She is aware that this may cause drowsiness and increase her risk for injury by falling.  The patient was given instructions to call make an appointment with Dr. Mack Guise who is the orthopedist on-call.  She is to ice and elevate over the weekend and return to the emergency department if any severe worsening of her pain.   ____________________________________________   FINAL CLINICAL IMPRESSION(S) / ED DIAGNOSES  Final diagnoses:  Closed fracture of distal end of left radius,  unspecified fracture morphology, initial encounter     ED Discharge Orders         Ordered    traMADol (ULTRAM) 50 MG tablet  Every 6 hours PRN     09/15/18 1006           Note:  This document was prepared using Dragon voice recognition software and may include unintentional dictation errors.  Johnn Hai, PA-C 09/15/18 1029    Lisa Roca, MD 09/16/18 [phone]

## 2018-09-16 ENCOUNTER — Telehealth: Payer: Self-pay | Admitting: Student

## 2018-09-16 NOTE — Telephone Encounter (Signed)
   Patient's daughter called to report that she had experienced a mechanical fall and fractured her left wrist. Did not hit her head. A cast was placed while in the ED and she does not anticipate surgical intervention at this time.    She reports having been told to make Dr. Rockey Situ aware of any falls that her mom experienced, therefore I will forward today's note to him for review. I reviewed with her that there is no acute indication to stop Eliquis at this time. If she continues to have frequent falls, will need to address the risk and benefits of this further.  She voiced understanding of this and was appreciative of the call.  Signed, Erma Heritage, PA-C 09/16/2018, 2:19 PM Pager: [phone]

## 2018-09-23 DIAGNOSIS — S52539A Colles' fracture of unspecified radius, initial encounter for closed fracture: Secondary | ICD-10-CM | POA: Insufficient documentation

## 2018-10-24 ENCOUNTER — Telehealth: Payer: Self-pay | Admitting: Cardiovascular Disease

## 2018-10-24 NOTE — Telephone Encounter (Signed)
I spoke with the patient.  She states she was seen at the cancer center at [facility] yesterday and was told her heart was out of rhythm.  She states that she cannot tell when she goes in/ out of rhythm. She was seen by Dr. Rockey Situ in February 2019 and she was in a-fib. She has been taking her eliquis 5 mg BID without missed doses.   She states she was told her labwork was "perferct" yesterday- cannot see any results from yesterday in [place].  BP 146/72, O2 sats- 97%, HR- 68.  I advised there patient I will review with Dr. Rockey Situ, but I am unsure that he will want to do anything different at this time as we know she has a-fib. She only complains of fatigue, but this is not newly worse.  She is scheduled to follow up with Dr. Rockey Situ in February.  The patient is aware we will call back with any further recommendations and is agreeable.

## 2018-10-24 NOTE — Telephone Encounter (Signed)
We did talk about a plan of action on last clinic visit when we knew she was in atrial fibrillation She was asymptomatic At that time she was leaning towards not doing cardioversion or aggressive intervention to get her back to normal sinus rhythm as she felt well If she does not feel well we could certainly pursue trying to restore normal sinus rhythm She could certainly go in and out of atrial fibrillation not know it or be in atrial fibrillation in a persistent manner and not know it As long as she is compliant with her anticoagulation she should be covered either way

## 2018-10-24 NOTE — Telephone Encounter (Signed)
Patient c/o Palpitations:  High priority if patient c/o lightheadedness, shortness of breath, or chest pain  1) How long have you had palpitations/irregular HR/ Afib? Are you having the symptoms now? yes  2) Are you currently experiencing lightheadedness, SOB or CP? sob  3) Do you have a history of afib (atrial fibrillation) or irregular heart rhythm? yes  4) Have you checked your BP or HR? (document readings if available): yesterday , but she forgot  5) Are you experiencing any other symptoms? Sob, and weakness when she walks.

## 2018-10-25 NOTE — Telephone Encounter (Signed)
S/w patient. She verbalized understanding of Dr Donivan Scull recommendations. She states she is not feeling anything fluttering or palpitations in her heart. She says she's feeling fine. She will let us know if anything changes.

## 2018-12-08 ENCOUNTER — Telehealth: Payer: Self-pay | Admitting: Cardiovascular Disease

## 2018-12-08 NOTE — Telephone Encounter (Signed)
lmov to schedule appt   Valora Corporal, RN  to [facility] Scheduling        12/07/18 9:51 AM  Do we have anything sooner?   Thanks  [place], [place] B  to [place], MD        12/07/18 9:46 AM  Is it possible to move my mom's appointment sooner with Dr. Rockey Situ? It is currently scheduled for Feb 25 at 2? Does he have anything earlier? And if possible would like the first appointment after lunch.  For [place]  DOB 01/03/1934   Thank you  [name]

## 2018-12-18 ENCOUNTER — Ambulatory Visit (INDEPENDENT_AMBULATORY_CARE_PROVIDER_SITE_OTHER): Payer: Medicare Other | Admitting: Cardiovascular Disease

## 2018-12-18 ENCOUNTER — Encounter: Payer: Self-pay | Admitting: Cardiovascular Disease

## 2018-12-18 VITALS — BP 148/70 | HR 53 | Ht 65.5 in | Wt 169.5 lb

## 2018-12-18 DIAGNOSIS — I480 Paroxysmal atrial fibrillation: Secondary | ICD-10-CM | POA: Diagnosis not present

## 2018-12-18 DIAGNOSIS — I4892 Unspecified atrial flutter: Secondary | ICD-10-CM | POA: Diagnosis not present

## 2018-12-18 MED ORDER — METOPROLOL SUCCINATE ER 100 MG PO TB24: 100.0000 mg | ORAL_TABLET | Freq: Every day | ORAL | 1 refills | Status: DC

## 2018-12-18 MED ORDER — LOSARTAN POTASSIUM 100 MG PO TABS: 100.0000 mg | ORAL_TABLET | Freq: Every day | ORAL | 3 refills | Status: DC

## 2018-12-18 MED ORDER — FUROSEMIDE 20 MG PO TABS: 20.0000 mg | ORAL_TABLET | Freq: Every day | ORAL | 5 refills | Status: DC | PRN

## 2018-12-18 NOTE — Patient Instructions (Addendum)
Call with heart rate and blood pressure in the next week  Medication Instructions:   Please decrease the metoprolol down to one a day (take in the PM)  Start losartan one a day in the morning  Take lasix as needed with potassium for shortness of breath, leg swelling, weight gain No more than 2 to 3 times a week  If you need a refill on your cardiac medications before your next appointment, please call your pharmacy.    Lab work: No new labs needed   If you have labs (blood work) drawn today and your tests are completely normal, you will receive your results only by: Marland Kitchen MyChart Message (if you have MyChart) OR . A paper copy in the mail If you have any lab test that is abnormal or we need to change your treatment, we will call you to review the results.   Testing/Procedures: No new testing needed   Follow-Up: At [facility], you and your health needs are our priority.  As part of our continuing mission to provide you with exceptional heart care, we have created designated Provider Care Teams.  These Care Teams include your primary Cardiologist (physician) and Advanced Practice Providers (APPs -  Physician Assistants and Nurse Practitioners) who all work together to provide you with the care you need, when you need it.  . You will need a follow up appointment in 2 months  . Providers on your designated Care Team:   . Murray Hodgkins, NP . Christell Faith, PA-C . Marrianne Mood, PA-C  Any Other Special Instructions Will Be Listed Below (If Applicable).  For educational health videos Log in to : [email or web address] Or : [email or web address], password : [email or web address]

## 2018-12-18 NOTE — Progress Notes (Signed)
Cardiology Office Note  Date:  12/18/2018   ID:  Barbara Mooney, DOB 13-Sep-1934, MRN [id number]  PCP:  Marinda Elk, MD   Chief Complaint  Patient presents with  . other    12 month f/u c/o afib/dizziness. Meds reviewed verbally with pt.    HPI:  Barbara Mooney is an 85 yo woman with  hypertension,  diabetes  GERD  sepsis November 2017,  noted to be in atrial fibrillation with RVR,  Cardioversion on 01/20/2017, back to NSR Who presents for routine follow-up of her persistent atrial fibrillation  In follow-up today she presents with her daughter Broke arm left in oct 2019 Medical management  Recently has not been feeling as well Did not feel well at therapy, vitals taken during exercise but numbers not available Periodic dizziness Feels her energy is poor  Heart rate at home 60 to 70 Sometimes 50s Heart rate in the 50s today.  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 Systolic function was normal. EF 55% to 60%.   asymptomatic from her atrial fibrillation  Metoprolol and Cardizem added for rate control  Currently taking Eliquis 5mg  bid.    This patients CHA2DS2-VASc Score and unadjusted Ischemic Stroke Rate (% per year) is equal to 7.2 % stroke rate/year from a score of 5  Above score calculated as 1 point each if present [CHF, HTN, DM, Vascular=MI/PAD/Aortic Plaque, Age if 65-74, or Female] Above score calculated as 2 points each if present [Age > 75, or Stroke/TIA/TE]  Cardioversion on 01/20/2017, back to  NSR   PMH:   has a past medical history of Arthritis, Bruises easily, Diabetes mellitus, Dysrhythmia, Edema, GERD (gastroesophageal reflux disease), Hypertension, Hypothyroidism, Lymphoma ([place]) (12/2017), MRSA infection, Multiple thyroid nodules, and Personal history of radiation therapy (12/2017).  PSH:    Past Surgical History:  Procedure Laterality Date  . BONE BIOPSY     2018  . CARDIOVERSION N/A 01/20/2017   Procedure: CARDIOVERSION;  Surgeon: Minna Merritts, MD;  Location: [facility] ORS;  Service: Cardiovascular;  Laterality: N/A;  . CARDIOVERSION     AFIB 2018  . CATARACT EXTRACTION W/PHACO Right 02/15/2018   Procedure: CATARACT EXTRACTION PHACO AND INTRAOCULAR LENS PLACEMENT (IOC);  Surgeon: Birder Robson, MD;  Location: [facility] ORS;  Service: Ophthalmology;  Laterality: Right;  [place] 01:22.8 AP% 18.9 CDE 15.62 Fluid Pack Lot # [id number]  . CATARACT EXTRACTION W/PHACO Left 03/08/2018   Procedure: CATARACT EXTRACTION PHACO AND INTRAOCULAR LENS PLACEMENT ([place]);  Surgeon: Birder Robson, MD;  Location: [facility] ORS;  Service: Ophthalmology;  Laterality: Left;  [place] 01:05 AP% 15.8 CDE 10.28 Fluid pak lot # [id number]    Current Outpatient Medications  Medication Sig Dispense Refill  . acetaminophen (TYLENOL) 500 MG tablet Take 500 mg by mouth at bedtime.    Marland Kitchen apixaban (ELIQUIS) 5 MG TABS tablet Take 1 tablet (5 mg total) by mouth 2 (two) times daily. 180 tablet 3  . CRANBERRY PO Take 1 capsule by mouth 2 (two) times daily.     . cyanocobalamin (,VITAMIN B-12,) 1000  MCG/ML injection Inject 1,000 mcg into the muscle every 30 (thirty) days.     Marland Kitchen diltiazem (CARDIZEM CD) 180 MG 24 hr capsule TAKE 1 CAPSULE BY MOUTH TWICE A DAY 180 capsule 2  . estradiol (ESTRACE) 0.1 MG/GM vaginal cream Place 1 Applicatorful vaginally daily. Use pea sized amount M-W-Fr before bedtime 42.5 g 12  . furosemide (LASIX) 20 MG tablet Take 1 tablet (20 mg total) by mouth daily as needed for fluid or edema (SOB). 30 tablet  5  . metFORMIN (GLUCOPHAGE) 500 MG tablet Take 500 mg by mouth 2 (two) times daily with a meal.    . metoprolol succinate (TOPROL-XL) 100 MG 24 hr tablet Take 1 tablet (100 mg total) by mouth daily. Take with or immediately following a meal. 90 tablet 1  . omeprazole (PRILOSEC) 20 MG capsule Take 1 capsule (20 mg total) by mouth daily. 30 capsule 6  . potassium chloride SA (K-DUR,KLOR-CON) 20 MEQ tablet Take 1 tablet (20 mEq total) by mouth as directed. (Patient taking differently: Take 20 mEq by mouth daily. ) 90 tablet 3  . traMADol (ULTRAM) 50 MG tablet Take 1 tablet (50 mg total) by mouth every 6 (six) hours as needed. 15 tablet 0  . losartan (COZAAR) 100 MG tablet Take 1 tablet (100 mg total) by mouth daily. 90 tablet 3   No current facility-administered medications for this visit.      Allergies:   Patient has no known allergies.   Social History:  The patient  reports that she has never smoked. She has never used smokeless tobacco. She reports that she does not drink alcohol or use drugs.   Family History:   family history includes Breast cancer (age of onset: 53) in her sister; Ovarian cancer in her maternal aunt.    Review of Systems: Review of Systems  Constitutional: Positive for malaise/fatigue.  Respiratory: Negative.   Cardiovascular: Positive for palpitations.  Gastrointestinal: Negative.   Musculoskeletal: Negative.   Psychiatric/Behavioral: Negative.   All other systems reviewed and are negative.    PHYSICAL EXAM: VS:  BP (!) 148/70 (BP Location: Left Arm, Patient Position: Sitting, Cuff Size: Normal)   Pulse (!) 53   Ht 5' 5.5" (1.664 m)   Wt 169 lb 8 oz (76.9 kg)   BMI 27.78 kg/m  , BMI Body mass index is 27.78 kg/m. Constitutional:  oriented to person, place, and time. No distress.  HENT:  Head: Grossly normal Eyes:  no discharge. No scleral icterus.  Neck: No JVD, no carotid bruits  Cardiovascular: Regular rate and rhythm, no murmurs  appreciated Pulmonary/Chest: Clear to auscultation bilaterally, no wheezes or rails Abdominal: Soft.  no distension.  no tenderness.  Musculoskeletal: Normal range of motion Neurological:  normal muscle tone. Coordination normal. No atrophy Skin: Skin warm and dry Psychiatric: normal affect, pleasant  Recent Labs: 01/11/2018: Hemoglobin 12.4; Platelets 321    Lipid Panel No results found for: CHOL, HDL, LDLCALC, TRIG    Wt Readings from Last 3 Encounters:  12/18/18 169 lb 8 oz (76.9 kg)  09/15/18 170 lb (77.1 kg)  07/05/18 173 lb 1 oz (78.5 kg)     ASSESSMENT AND PLAN:  Paroxysmal atrial fibrillation (HCC) - Relatively asymptomatic atrial fibrillation but now with bradycardia Recommend she decrease the metoprolol down to 100 daily in the evening Previously was on 100 twice daily succinate May need to decrease the dose further for continued bradycardia Also on high-dose diltiazem twice daily which may need to be decreased  Essential hypertension Blood pressure running high at home also today Recommend she start losartan 100 daily  Unsteady gait Reports she is completing PT Legs are weak after she broke her arm October 2019  Hyperlipidemia No recent labs available   Total encounter time more than 25 minutes  Greater than 50% was spent in counseling and coordination of care with the patient   Disposition:   F/U  12 months   Orders Placed This Encounter  Procedures  . EKG 12-Lead     Signed, Esmond Plants, M.D., Ph.D. 12/18/2018  [place], [place]

## 2018-12-25 ENCOUNTER — Telehealth: Payer: Self-pay | Admitting: Cardiovascular Disease

## 2018-12-25 NOTE — Telephone Encounter (Signed)
Patient calling to check on status of Dr. Donivan Scull response on BP See Mychart message

## 2018-12-26 ENCOUNTER — Ambulatory Visit: Payer: Medicare Other | Admitting: Cardiovascular Disease

## 2018-12-26 NOTE — Telephone Encounter (Signed)
Call incoming from pts daughter, [name]. She is making 2nd attempt r/t 12/24/18 mychart message,   "Dr. Rockey Situ,   Please see PB numbers since my mom's last visit and since beginning new meds and reducing the one. Pulse looks better but BP is a little higher than it has been. Let me know if you would like to continue with the same medicine combo or if you wish her to change something. Thank you    1/7 - 11:15am - 176/98 - pulse 62  1/8 - 11:45am - 161/104 - pulse 71  1/9 - 11:55am - 153/98 - pulse 72  1/10 - 11:45am - 158/98 - pulse 76  1/11 - 11:55am - 158/98 - pulse 76  1/12 - 1:45pm - 174/97 - pulse 76"   Routed to provider.

## 2018-12-27 NOTE — Telephone Encounter (Signed)
Would start spironolactone 25 mg daily for BP Cut the potassium in 1/2 daily (appears she is taking 20 daily now) Recheck BMP in 2-3 weeks Monitor BP

## 2018-12-28 MED ORDER — POTASSIUM CHLORIDE CRYS ER 20 MEQ PO TBCR: 10.0000 meq | EXTENDED_RELEASE_TABLET | ORAL | 3 refills | Status: DC

## 2018-12-28 MED ORDER — SPIRONOLACTONE 25 MG PO TABS: 25.0000 mg | ORAL_TABLET | Freq: Every day | ORAL | 3 refills | Status: DC

## 2018-12-28 NOTE — Telephone Encounter (Signed)
Called and s/w patient's daughter, ok per DPR.  She verbalized understanding of Dr Donivan Scull recommendations and plan of care.  Rx sent to pharmacy. Patient has appointment with PCP at [facility] the first week of February and will be getting lab work. Patient's daughter will send [place] a message to let us know this has been done so Dr Rockey Situ can Review. She will continue to monitor BP.

## 2019-01-02 NOTE — Telephone Encounter (Signed)
I guess disregard Too many mychart, phone call and visits for me  to keep up with changes

## 2019-01-10 ENCOUNTER — Ambulatory Visit
Admission: RE | Admit: 2019-01-10 | Discharge: 2019-01-10 | Disposition: A | Discharge status: Home / Self Care | Payer: Medicare Other | Source: Ambulatory Visit | Attending: Radiation Oncology | Admitting: Radiation Oncology

## 2019-01-10 ENCOUNTER — Other Ambulatory Visit: Payer: Self-pay

## 2019-01-10 ENCOUNTER — Encounter: Payer: Self-pay | Admitting: Radiation Oncology

## 2019-01-10 ENCOUNTER — Ambulatory Visit: Payer: Medicare Other | Admitting: Radiation Oncology

## 2019-01-10 VITALS — BP 185/83 | HR 63 | Temp 97.7°F | Resp 16 | Wt 168.8 lb

## 2019-01-10 DIAGNOSIS — I10 Essential (primary) hypertension: Secondary | ICD-10-CM | POA: Diagnosis not present

## 2019-01-10 DIAGNOSIS — R2681 Unsteadiness on feet: Secondary | ICD-10-CM | POA: Insufficient documentation

## 2019-01-10 DIAGNOSIS — C8299 Follicular lymphoma, unspecified, extranodal and solid organ sites: Secondary | ICD-10-CM

## 2019-01-10 DIAGNOSIS — C8205 Follicular lymphoma grade I, lymph nodes of inguinal region and lower limb: Secondary | ICD-10-CM | POA: Diagnosis present

## 2019-01-10 DIAGNOSIS — I4891 Unspecified atrial fibrillation: Secondary | ICD-10-CM | POA: Diagnosis not present

## 2019-01-10 DIAGNOSIS — Z923 Personal history of irradiation: Secondary | ICD-10-CM | POA: Diagnosis not present

## 2019-01-10 NOTE — Progress Notes (Signed)
Radiation Oncology Follow up Note  Name: Barbara Mooney   Date:   01/10/2019 MRN:  [id number] DOB: 20-Oct-1934    This 85 y.o. female presents to the clinic today for 1 month follow-up status post radiation therapy to her right knee for stage I follicular lymphoma.  REFERRING PROVIDER: Marinda Elk, MD  HPI: patient is a 85 year old female now out 11 months having completed radiation therapy to her right knee for stage I E follicular lymphoma. She is seen today in routine follow-up doing well she is ambulating well she's having no pain in her right knee. Last PET scan was at [facility] back in May 2019 showing no evidence of hypermetabolic activity with clear complete response in the right knee. Patient does have some problems with atrial fibrillation.essential hypertension and unsteady gait.  COMPLICATIONS OF TREATMENT: none  FOLLOW UP COMPLIANCE: keeps appointments   PHYSICAL EXAM:  BP (!) 185/83 (BP Location: Left Arm, Patient Position: Sitting)   Pulse 63   Temp 97.7 F (36.5 C) (Tympanic)   Resp 16   Wt 168 lb 12.2 oz (76.5 kg)   BMI 27.66 kg/m  Range of motion of the right knee does not elicit pain. No swelling in the right lower extremity is noted. No peripheral adenopathy is identified.Well-developed well-nourished patient in NAD. HEENT reveals PERLA, EOMI, discs not visualized.  Oral cavity is clear. No oral mucosal lesions are identified. Neck is clear without evidence of cervical or supraclavicular adenopathy. Lungs are clear to A&P. Cardiac examination is essentially unremarkable with regular rate and rhythm without murmur rub or thrill. Abdomen is benign with no organomegaly or masses noted. Motor sensory and DTR levels are equal and symmetric in the upper and lower extremities. Cranial nerves II through XII are grossly intact. Proprioception is intact. No peripheral adenopathy or edema is identified. No motor or sensory levels are noted. Crude visual fields are within normal  range.  RADIOLOGY RESULTS: report from [facility] PET CT scans reviewed  PLAN: present time she continues to do well with no evidence of disease she continues follow-up care at [facility] will probably have another PET CT scan in the next several months which I've asked the patient to forward to me for review. I will see her back in 1 year for follow-up. Patient is to call sooner with any concerns. She has no evidence of disease.  I would like to take this opportunity to thank you for allowing me to participate in the care of your patient.Noreene Filbert, MD

## 2019-01-15 ENCOUNTER — Telehealth: Payer: Self-pay

## 2019-01-15 MED ORDER — DOXAZOSIN MESYLATE 1 MG PO TABS: 1.0000 mg | ORAL_TABLET | Freq: Every day | ORAL | 3 refills | Status: DC

## 2019-01-15 NOTE — Telephone Encounter (Signed)
Spoke with husband, okay per DPR.   Mychart recommendation received from Dr. Rockey Situ " I would add cardura 1 mg daily in the Am  Thx  TG  "   Barbara Mooney voiced understanding of new order.  Rx placed to pt preferred pharmacy. No further orders at this time.   Advised pt to call for any further questions or concerns.

## 2019-01-15 NOTE — Telephone Encounter (Signed)
Call to patient daughter, okay per DPR.   Looked into phone encounter to see current plan of care. Multiple encounters present for same complaint.   Present plan of care is to start Cardura 1 mg (has not been previously written prior to 2/3) once daily in the am in response to mychart recorded BP readings.  Rx sent to pt preferred pharmmacy.   Pt has lab f/u this week at [facility], daughter will request they be faxed to [place].   No further orders at this time. Continue to take home BP readings. F/u 02/19/19.

## 2019-01-15 NOTE — Telephone Encounter (Signed)
Patient daughter returning call  °

## 2019-01-22 ENCOUNTER — Other Ambulatory Visit: Payer: Self-pay | Admitting: *Deleted

## 2019-01-22 MED ORDER — POTASSIUM CHLORIDE CRYS ER 20 MEQ PO TBCR: EXTENDED_RELEASE_TABLET | ORAL | Status: DC

## 2019-01-24 NOTE — Telephone Encounter (Signed)
Go on prescribed dose 1/2 daily Would check other meds in pill box

## 2019-01-25 ENCOUNTER — Telehealth: Payer: Self-pay | Admitting: Cardiovascular Disease

## 2019-01-25 NOTE — Telephone Encounter (Signed)
Spoke with patients daughter per release form and she reports her mother has been having nausea and she just wanted to confirm her medications. She states that her mother started the Cardura and started feeling better but then she started having the nausea and was not feeling well. Reviewed all medications doses and frequency. She read back information and wants to hold the cardura to see if her nausea will improve. Advised to monitor her blood pressures closely and keep a log with those and we like them to be less than 140/90. Also advised that she should call us on Monday to let us know her blood pressure readings and see how she is feeling. Confirmed her potassium dosage as well with her. She verbalized understanding of our conversation, signs and symptoms to monitor for that would require immediate ER visit, and she verbalized understanding with no further questions at this time.

## 2019-01-25 NOTE — Telephone Encounter (Signed)
Please call regarding rx that was just prescribed. Pt daughter would like to know if pt has taken this before. Please call to discuss.

## 2019-01-29 NOTE — Telephone Encounter (Signed)
Patient daughter calling with weekend report and bp log   Last week  2/8  132/73  59 2/11 101/63  51 2/13 143/75  74  Stopped meds  2/14 133/86  69 2/15 132/61  37 (before other meds) 2/16 126/65  50  Daughter concerned HR in morning is too low (30'-40's) Patient had hallucinations and weakness

## 2019-01-29 NOTE — Telephone Encounter (Signed)
Spoke with patients daughter per release form and reviewed readings and patients symptoms. She reports that she still has not been feeling well but that her pressures have been good. She did have concern over one low heart rate but reviewed that with her being in afib those readings are sometimes not accurate on machines. She does report she is still very weak and just overall does not feel well. Reviewed that she may want to see her PCP for further evaluation to make sure something else is not going on. She confirmed she has appointment on Wednesday with her PCP. Instructed her to continue monitoring her blood pressures and if they consistently stay 140/90's then she should restart the cardura. Requested that she keep a log of those readings and bring into her next appointment. Confirmed upcoming appointment here in our office and she verbalized understanding with no further questions at this time.

## 2019-01-31 ENCOUNTER — Telehealth: Payer: Self-pay | Admitting: Cardiovascular Disease

## 2019-01-31 NOTE — Telephone Encounter (Signed)
Left voicemail message with appointment information for tomorrow.

## 2019-01-31 NOTE — Telephone Encounter (Signed)
Call placed to the [facility] clinic. They stated that they had no record of a phone call placed to this office. They have been informed that the patient has an appointment with Dr. Rockey Situ tomorrow, 2/20.  Call placed to the patient. Left a message for the patient to call back.

## 2019-01-31 NOTE — Telephone Encounter (Signed)
STAT if HR is under 50 or over 120 (normal HR is 60-100 beats per minute)  1) What is your heart rate? 39  Per ekg and in afib in office with pcp [facility]   2) Do you have a log of your heart rate readings (document readings)?  Trending brady in 50's   3) Do you have any other symptoms? Denies sx of cp palp syncope added to 2/20 Gollan at 840 am

## 2019-02-01 ENCOUNTER — Encounter: Payer: Self-pay | Admitting: Cardiovascular Disease

## 2019-02-01 ENCOUNTER — Ambulatory Visit (INDEPENDENT_AMBULATORY_CARE_PROVIDER_SITE_OTHER): Payer: Medicare Other | Admitting: Cardiovascular Disease

## 2019-02-01 VITALS — BP 120/62 | HR 53 | Ht 65.0 in | Wt 168.5 lb

## 2019-02-01 DIAGNOSIS — I480 Paroxysmal atrial fibrillation: Secondary | ICD-10-CM | POA: Diagnosis not present

## 2019-02-01 DIAGNOSIS — I10 Essential (primary) hypertension: Secondary | ICD-10-CM | POA: Diagnosis not present

## 2019-02-01 DIAGNOSIS — R001 Bradycardia, unspecified: Secondary | ICD-10-CM | POA: Diagnosis not present

## 2019-02-01 DIAGNOSIS — E118 Type 2 diabetes mellitus with unspecified complications: Secondary | ICD-10-CM | POA: Diagnosis not present

## 2019-02-01 DIAGNOSIS — M7989 Other specified soft tissue disorders: Secondary | ICD-10-CM

## 2019-02-01 DIAGNOSIS — Z7189 Other specified counseling: Secondary | ICD-10-CM

## 2019-02-01 DIAGNOSIS — Z794 Long term (current) use of insulin: Secondary | ICD-10-CM

## 2019-02-01 MED ORDER — DILTIAZEM HCL ER COATED BEADS 180 MG PO CP24: 180.0000 mg | ORAL_CAPSULE | Freq: Every day | ORAL | 3 refills | Status: DC

## 2019-02-01 NOTE — Patient Instructions (Addendum)
Monitor blood pressure and heart rate  at home after changes  Medication Instructions:  Your physician has recommended you make the following change in your medication:  1. STOP Metoprolol 2. DECREASE Diltiazem to 180 mg once daily  If you need a refill on your cardiac medications before your next appointment, please call your pharmacy.    Lab work: No new labs needed   If you have labs (blood work) drawn today and your tests are completely normal, you will receive your results only by: Marland Kitchen MyChart Message (if you have MyChart) OR . A paper copy in the mail If you have any lab test that is abnormal or we need to change your treatment, we will call you to review the results.   Testing/Procedures: No new testing needed   Follow-Up: At [facility], you and your health needs are our priority.  As part of our continuing mission to provide you with exceptional heart care, we have created designated Provider Care Teams.  These Care Teams include your primary Cardiologist (physician) and Advanced Practice Providers (APPs -  Physician Assistants and Nurse Practitioners) who all work together to provide you with the care you need, when you need it.  . You will need a follow up appointment in 1 month . Providers on your designated Care Team:   . Murray Hodgkins, NP . Christell Faith, PA-C . Marrianne Mood, PA-C  Any Other Special Instructions Will Be Listed Below (If Applicable).  For educational health videos Log in to : [email or web address] Or : [email or web address], password : [email or web address]

## 2019-02-01 NOTE — Progress Notes (Signed)
Cardiology Office Note  Date:  02/01/2019   ID:  SHARIFAH CHAMPINE, DOB November 11, 1934, MRN [id number]  PCP:  Marinda Elk, MD   Chief Complaint  Patient presents with  . other    Pt. c/o bradycardia and shortness of breath. Meds reviewed by the pt. verbally.     HPI:  Ms. 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  Daughter does most of the talking for her, she reports her mother has had fatigue but is nonspecific  Denies any dizziness, orthostasis, near syncope  EKG personally reviewed by myself on today's visit showing Atrial fibrillation, right bundle branch block, ventricular rate variable from the 40s up to the 50s, rare pause 2.25 seconds   Other past medical history reviewed  previously in the ED 10/28/16 with generalized weakness, fatigue, fever and chills  found to have sepsis secondary to a urinary source   hypotensive , atrial fibrillation  Echocardiogram done in the hospital   concentric hypertrophy. Systolic function was normal. EF 55% to 60%.   asymptomatic from her atrial fibrillation  Metoprolol and Cardizem added for rate control  Currently taking Eliquis 5mg  bid.    This patients CHA2DS2-VASc Score and unadjusted Ischemic Stroke  Rate (% per year) is equal to 7.2 % stroke rate/year from a score of 5  Above score calculated as 1 point each if present [CHF, HTN, DM, Vascular=MI/PAD/Aortic Plaque, Age if 65-74, or Female] Above score calculated as 2 points each if present [Age > 75, or Stroke/TIA/TE]  Cardioversion on 01/20/2017, back to NSR   PMH:   has a past medical history of Arthritis, Bruises easily, Diabetes mellitus, Dysrhythmia, Edema, GERD (gastroesophageal reflux disease), Hypertension, Hypothyroidism, Lymphoma ([place]) (12/2017), MRSA infection, Multiple thyroid nodules, and Personal history of radiation therapy (12/2017).  PSH:    Past Surgical History:  Procedure Laterality Date  . BONE BIOPSY     2018  . CARDIOVERSION N/A 01/20/2017   Procedure: CARDIOVERSION;  Surgeon: Minna Merritts, MD;  Location: [facility] ORS;  Service: Cardiovascular;  Laterality: N/A;  . CARDIOVERSION     AFIB 2018  . CATARACT EXTRACTION W/PHACO Right 02/15/2018   Procedure: CATARACT EXTRACTION PHACO AND INTRAOCULAR LENS PLACEMENT (IOC);  Surgeon: Birder Robson, MD;  Location: [facility] ORS;  Service: Ophthalmology;  Laterality: Right;  [place] 01:22.8 AP% 18.9 CDE 15.62 Fluid Pack Lot # [id number]  . CATARACT EXTRACTION W/PHACO Left 03/08/2018   Procedure: CATARACT EXTRACTION PHACO AND INTRAOCULAR LENS PLACEMENT ([place]);  Surgeon: Birder Robson, MD;  Location: [facility] ORS;  Service: Ophthalmology;  Laterality: Left;  [place] 01:05 AP% 15.8 CDE 10.28 Fluid pak lot # [id number]    Current Outpatient Medications  Medication Sig Dispense Refill  . acetaminophen (TYLENOL) 500 MG tablet Take 500 mg by mouth at  bedtime.    Marland Kitchen apixaban (ELIQUIS) 5 MG TABS tablet Take 1 tablet (5 mg total) by mouth 2 (two) times daily. 180 tablet 3  . CRANBERRY PO Take 1 capsule by mouth 2 (two) times daily.     . cyanocobalamin (,VITAMIN B-12,) 1000 MCG/ML injection Inject 1,000 mcg into the muscle every 30 (thirty) days.     Marland Kitchen diltiazem (CARDIZEM CD) 180 MG 24 hr capsule  TAKE 1 CAPSULE BY MOUTH TWICE A DAY 180 capsule 2  . estradiol (ESTRACE) 0.1 MG/GM vaginal cream Place 1 Applicatorful vaginally daily. Use pea sized amount M-W-Fr before bedtime 42.5 g 12  . furosemide (LASIX) 20 MG tablet Take 1 tablet (20 mg total) by mouth daily as needed for fluid or edema (SOB). 30 tablet 5  . losartan (COZAAR) 100 MG tablet Take 1 tablet (100 mg total) by mouth daily. 90 tablet 3  . metFORMIN (GLUCOPHAGE) 500 MG tablet Take 500 mg by mouth 2 (two) times daily with a meal.    . metoprolol succinate (TOPROL-XL) 100 MG 24 hr tablet Take 1 tablet (100 mg total) by mouth daily. Take with or immediately following a meal. 90 tablet 1  . omeprazole (PRILOSEC) 20 MG capsule Take 1 capsule (20 mg total) by mouth daily. 30 capsule 6  . potassium chloride SA (K-DUR,KLOR-CON) 20 MEQ tablet Take 0.5 tablet (10 meq) by mouth once daily Monday-Friday, hold Saturday & Sunday unless you take an extra lasix    . spironolactone (ALDACTONE) 25 MG tablet Take 1 tablet (25 mg total) by mouth daily. 90 tablet 3  . traMADol (ULTRAM) 50 MG tablet Take 1 tablet (50 mg total) by mouth every 6 (six) hours as needed. 15 tablet 0   No current facility-administered medications for this visit.      Allergies:   Other   Social History:  The patient  reports that she has never smoked. She has never used smokeless tobacco. She reports that she does not drink alcohol or use drugs.   Family History:   family history includes Arrhythmia in her sister; Breast cancer (age of onset: 10) in her sister; Heart disease in her brother; Ovarian cancer in her maternal aunt; Stroke in her sister.    Review of Systems: Review of Systems  Constitutional: Positive for malaise/fatigue.  Respiratory: Negative.   Cardiovascular: Positive for palpitations.  Gastrointestinal: Negative.   Musculoskeletal: Negative.   Psychiatric/Behavioral: Negative.   All other systems reviewed and are negative.    PHYSICAL  EXAM: VS:  BP 120/62 (BP Location: Left Arm, Patient Position: Sitting, Cuff Size: Normal)   Pulse (!) 53   Ht 5\' 5"  (1.651 m)   Wt 168 lb 8 oz (76.4 kg)   BMI 28.04 kg/m  , BMI Body mass index is 28.04 kg/m. Constitutional:  oriented to person, place, and time. No distress.  HENT:  Head: Normocephalic and atraumatic.  Eyes:  no discharge. No scleral icterus.  Neck: Normal range of motion. Neck supple. No JVD present.  Cardiovascular: Bradycardic, irregular normal heart sounds and intact distal pulses. Exam reveals no gallop and no friction rub. No edema No murmur heard. Pulmonary/Chest: Effort normal and breath sounds normal. No stridor. No respiratory distress.  no wheezes.  no rales.  no tenderness.  Abdominal: Soft.  no distension.  no tenderness.  Musculoskeletal: Normal range of motion.  no  tenderness or deformity.  Neurological:  normal muscle tone. Coordination normal. No atrophy Skin: Skin is warm and dry. No rash  noted. not diaphoretic.  Psychiatric:  normal mood and affect. behavior is normal. Thought content normal.   Recent Labs: No results found for requested labs within last 8760 hours.    Lipid Panel No results found for: CHOL, HDL, LDLCALC, TRIG    Wt Readings from Last 3 Encounters:  02/01/19 168 lb 8 oz (76.4 kg)  01/10/19 168 lb 12.2 oz (76.5 kg)  12/18/18 169 lb 8 oz (76.9 kg)     ASSESSMENT AND PLAN:  Paroxysmal atrial fibrillation (HCC) - Continues to have bradycardia, unclear if she is symptomatic Low energy in general but no near syncope or syncope, no orthostasis symptoms EKG from primary care reviewed showing heart rates into the 30s Recommended that she stop her metoprolol But also decrease diltiazem down to 180 mg daily rather than twice daily dosing -Stressed importance of close monitoring of heart rate at home -If heart rate continues to run low we will stop the diltiazem  Essential hypertension On last clinic visit losartan 100 was  started Blood pressure stable today 599 systolic If blood pressure runs high by holding metoprolol and 1 of her diltiazem pills they do have Cardura at home which they are holding They will monitor blood pressures and call us with numbers  Unsteady gait Previously completed PT Legs are weak after she broke her arm October 2019 We will see if increased heart rate and less medications helps her energy and symptoms Legs are weak in general, no regular exercise program  Hyperlipidemia No recent labs available  Long discussion with daughter and patient, all questions answered Discussed causes of bradycardia, potential symptoms, discussed medication changes in detail, Discussed what to look for going forward Primary care office records were requested and reviewed  Total encounter time more than 45 minutes  Greater than 50% was spent in counseling and coordination of care with the patient   Disposition:   F/U  1 month   Orders Placed This Encounter  Procedures  . EKG 12-Lead     Signed, Esmond Plants, M.D., Ph.D. 02/01/2019  [place], [place]

## 2019-02-06 ENCOUNTER — Ambulatory Visit: Payer: Medicare Other | Admitting: Cardiovascular Disease

## 2019-02-09 ENCOUNTER — Telehealth: Payer: Self-pay | Admitting: Cardiovascular Disease

## 2019-02-09 NOTE — Telephone Encounter (Signed)
error 

## 2019-02-09 NOTE — Telephone Encounter (Signed)
New Message  Please view message below sent by patients daughter via mychart:  Please see the attached BP and pulse numbers since the meds have been adjusted.Also, my mom had her bone density and Mimi has prescribed something to help with her thinning bones.She is to take Foxamax - 1 tablet - 70 mg per day as well as over the counter calcium.Just making sure it will not interfere with her current medications.  2/21 - 6:30 pm - 128/83 pulse 88 2/22 - morning - 151/88 pulse 100 2/23 - 2:30 pm - 175/85 pulse 88 2/24 - 2:00 pm - 134/71 pulse 55 2/25 - 8:30 pm - 131/85 pulse 87 2/26 - 8:30 am - 127/82 pulse 96

## 2019-02-09 NOTE — Telephone Encounter (Signed)
To Dr. Gollan to review.  

## 2019-02-11 ENCOUNTER — Other Ambulatory Visit: Payer: Self-pay | Admitting: Cardiovascular Disease

## 2019-02-11 NOTE — Telephone Encounter (Signed)
BP numbers look better, Stay on same meds

## 2019-02-12 NOTE — Telephone Encounter (Signed)
Spoke with the patient's daughter Anderson Malta and made her aware of Dr.Gollan's response and recommendation. [name] verbalized appreciation for the call back.

## 2019-02-12 NOTE — Telephone Encounter (Signed)
Spoke with patient and advised her of Dr.Gollan's response and recommendation. Patient verbalizes understanding and voiced appreciation for the call.

## 2019-02-12 NOTE — Telephone Encounter (Signed)
Patient daughter [name] on [place] calling for Dr. Gwenyth Ober response.  She states patient gets confused and would like the directions as well. Please call.  If no answer leave detailed vm.

## 2019-02-19 ENCOUNTER — Ambulatory Visit: Payer: Medicare Other | Admitting: Cardiovascular Disease

## 2019-03-01 ENCOUNTER — Telehealth: Payer: Self-pay | Admitting: Cardiovascular Disease

## 2019-03-01 NOTE — Telephone Encounter (Signed)
Patient's daughter will also send BP readings via MyChart fro review. Dr Rockey Situ had requested these at last visit. Patient is doing well. She has more energy.      Cardiac Questionnaire:    Since your last visit or hospitalization:  Questions asked to patient's daughter.     1. Have you been having new or worsening chest pain? no   2. Have you been having new or worsening shortness of breath? no 3. Have you been having new or worsening leg swelling, wt gain, or increase in abdominal girth (pants fitting more tightly)? no   4. Have you had any passing out spells? no    *A YES to any of these questions would result in the appointment being kept. *If all the answers to these questions are NO, we should indicate that given the current situation regarding the worldwide coronarvirus pandemic, at the recommendation of the CDC, we are looking to limit gatherings in our waiting area, and thus will reschedule their appointment beyond four weeks from today.   _____________      Primary Cardiologist:  Dr Rockey Situ  Patient contacted.  History reviewed.  No symptoms to suggest any unstable cardiac conditions.  Based on discussion, with current pandemic situation, we will be postponing this appointment for @PATIENTNAME @.  If symptoms change, she has been instructed to contact our office.  Routing to C19 CANCEL pool for tracking (P CV DIV CV19 CANCEL).  Roney Jaffe, RN  03/01/2019 10:35 AM         .

## 2019-03-01 NOTE — Telephone Encounter (Signed)
Patient daughter cancelled visit due to risk of COVID 41 .

## 2019-03-01 NOTE — Telephone Encounter (Signed)
-----   Message from Clarisse Gouge sent at 02/28/2019 10:53 AM EDT ----- Regarding: Appointment Call daughter [name]  to reschedule when able to do rov.

## 2019-03-06 ENCOUNTER — Telehealth: Payer: Self-pay

## 2019-03-06 ENCOUNTER — Encounter: Payer: Self-pay | Admitting: Cardiovascular Disease

## 2019-03-06 ENCOUNTER — Ambulatory Visit: Payer: Medicare Other | Admitting: Cardiovascular Disease

## 2019-03-06 NOTE — Progress Notes (Signed)
Samples given:  Eliquis 5 mg Lot: [id number] Exp: 6/22 # 1 box given  Samples placed the front desk for pick.

## 2019-03-06 NOTE — Telephone Encounter (Signed)
Pt daughter states pt needs a week of Eliquis, states it will be a week before pt can get a rx. Please call to discuss.

## 2019-03-06 NOTE — Telephone Encounter (Signed)
Samples given:  Eliquis 5 mg Lot: [id number] Exp: 6/22 # 1 box given  Samples placed the front desk for pick.

## 2019-03-15 NOTE — Telephone Encounter (Signed)
Patient interested in [place], perhaps with MyChart function next week. She wants her daughter to be present for the visit. Will route to scheduling pool and take out of the COVID-19 pool.

## 2019-03-16 ENCOUNTER — Other Ambulatory Visit: Payer: Self-pay

## 2019-03-16 ENCOUNTER — Telehealth: Payer: Self-pay | Admitting: Cardiovascular Disease

## 2019-03-16 MED ORDER — APIXABAN 5 MG PO TABS: 5.0000 mg | ORAL_TABLET | Freq: Two times a day (BID) | ORAL | 3 refills | Status: DC

## 2019-03-16 NOTE — Telephone Encounter (Signed)
Pt last saw Dr Rockey Situ 02/01/19, last labs at [facility] 01/17/19 (care everywhere) Creat 1.1, age 85, weight 76.4kg, based on specified criteria pt is on appropriate dosage of Eliquis 5mg  BID.  Will refill rx.

## 2019-03-16 NOTE — Telephone Encounter (Signed)
Eliquis refill already sent by another RN.

## 2019-03-16 NOTE — Telephone Encounter (Signed)
Pt daughter is calling, states that she sent a mychart message regatrding her BP, please call to discuss.

## 2019-03-16 NOTE — Telephone Encounter (Signed)
Spoke with the pt daughter [name]. Adv her that the BP readings were received through mychart and were fwd yesterday for Dr.Gollan to review. She verbalized understanding and just wanted to confirm that they were received.   [name] also sts that the pt will need a refill of Eliquis. Eliquis 5mg  bid refilled for #180 R-3.

## 2019-03-16 NOTE — Telephone Encounter (Signed)
*  STAT* If patient is at the pharmacy, call can be transferred to refill team.   1. Which medications need to be refilled? (please list name of each medication and dose if known) Eliquis  2. Which pharmacy/location (including street and city if local pharmacy) is medication to be sent to?  [facility]  3. Do they need a 30 day or 90 day supply? [place]

## 2019-03-29 ENCOUNTER — Telehealth: Payer: Self-pay | Admitting: Cardiovascular Disease

## 2019-03-29 NOTE — Telephone Encounter (Signed)
lmov with Daughter [name]

## 2019-03-29 NOTE — Telephone Encounter (Signed)
Virtual Visit Pre-Appointment Phone Call  Steps For Call:  1. Confirm consent - "In the setting of the current Covid19 crisis, you are scheduled for a (phone or video) visit with your provider on (date) at (time).  Just as we do with many in-office visits, in order for you to participate in this visit, we must obtain consent.  If you'd like, I can send this to your mychart (if signed up) or email for you to review.  Otherwise, I can obtain your verbal consent now.  All virtual visits are billed to your insurance company just like a normal visit would be.  By agreeing to a virtual visit, we'd like you to understand that the technology does not allow for your provider to perform an examination, and thus may limit your provider's ability to fully assess your condition.  Finally, though the technology is pretty good, we cannot assure that it will always work on either your or our end, and in the setting of a video visit, we may have to convert it to a phone-only visit.  In either situation, we cannot ensure that we have a secure connection.  Are you willing to proceed?" STAFF: Did the patient verbally acknowledge consent to telehealth visit? Document YES/NO here: Yes   2. Confirm the BEST phone number to call the day of the visit by including in appointment notes  3. Give patient instructions for WebEx/MyChart download to smartphone as below or Doximity/Doxy.me if video visit (depending on what platform provider is using)  4. Advise patient to be prepared with their blood pressure, heart rate, weight, any heart rhythm information, their current medicines, and a piece of paper and pen handy for any instructions they may receive the day of their visit  5. Inform patient they will receive a phone call 15 minutes prior to their appointment time (may be from unknown caller ID) so they should be prepared to answer  6. Confirm that appointment type is correct in Epic appointment notes (VIDEO vs  PHONE)     TELEPHONE CALL NOTE  Barbara Mooney has been deemed a candidate for a follow-up tele-health visit to limit community exposure during the Covid-19 pandemic. I spoke with the patient via phone to ensure availability of phone/video source, confirm preferred email & phone number, and discuss instructions and expectations.  I reminded Barbara Mooney to be prepared with any vital sign and/or heart rhythm information that could potentially be obtained via home monitoring, at the time of her visit. I reminded Barbara Mooney to expect a phone call at the time of her visit if her visit.  Ace Gins 03/29/2019 4:52 PM   INSTRUCTIONS FOR DOWNLOADING THE [place] APP TO SMARTPHONE  - If Apple, ask patient to go to App Store and type in WebEx in the search bar. [employer], the blue/green circle. If Android, go to [employer] and type in [employer] in the search bar. The app is free but as with any other app downloads, their phone may require them to verify saved payment information or Apple/Android password.  - The patient does NOT have to create an account. - On the day of the visit, the assist will walk the patient through joining the meeting with the meeting number/password.  INSTRUCTIONS FOR DOWNLOADING THE MYCHART APP TO SMARTPHONE  - The patient must first make sure to have activated MyChart and know their login information - If Apple, go to [employer] and type in  MyChart in the search bar and download the app. If Android, ask patient to go to [employer] and type in [place] in the search bar and download the app. The app is free but as with any other app downloads, their phone may require them to verify saved payment information or Apple/Android password.  - The patient will need to then log into the app with their MyChart username and password, and select [facility] as their healthcare provider to link the account. When it is time for your visit, go  to the MyChart app, find appointments, and click Begin Video Visit. Be sure to Select Allow for your device to access the Microphone and Camera for your visit. You will then be connected, and your provider will be with you shortly.  **If they have any issues connecting, or need assistance please contact MyChart service desk [phone])**  **If using a computer, in order to ensure the best quality for their visit they will need to use either of the following Internet Browsers: [employer], or Google Chrome**  IF USING DOXIMITY or DOXY.ME - The patient will receive a link just prior to their visit, either by text or email (to be determined day of appointment depending on if it's doxy.me or Doximity).     FULL LENGTH CONSENT FOR TELE-HEALTH VISIT   I hereby voluntarily request, consent and authorize [facility] and its employed or contracted physicians, physician assistants, nurse practitioners or other licensed health care professionals (the Practitioner), to provide me with telemedicine health care services (the Services") as deemed necessary by the treating Practitioner. I acknowledge and consent to receive the Services by the Practitioner via telemedicine. I understand that the telemedicine visit will involve communicating with the Practitioner through live audiovisual communication technology and the disclosure of certain medical information by electronic transmission. I acknowledge that I have been given the opportunity to request an in-person assessment or other available alternative prior to the telemedicine visit and am voluntarily participating in the telemedicine visit.  I understand that I have the right to withhold or withdraw my consent to the use of telemedicine in the course of my care at any time, without affecting my right to future care or treatment, and that the Practitioner or I may terminate the telemedicine visit at any time. I understand that I have the right  to inspect all information obtained and/or recorded in the course of the telemedicine visit and may receive copies of available information for a reasonable fee.  I understand that some of the potential risks of receiving the Services via telemedicine include:   Delay or interruption in medical evaluation due to technological equipment failure or disruption;  Information transmitted may not be sufficient (e.g. poor resolution of images) to allow for appropriate medical decision making by the Practitioner; and/or   In rare instances, security protocols could fail, causing a breach of personal health information.  Furthermore, I acknowledge that it is my responsibility to provide information about my medical history, conditions and care that is complete and accurate to the best of my ability. I acknowledge that Practitioner's advice, recommendations, and/or decision may be based on factors not within their control, such as incomplete or inaccurate data provided by me or distortions of diagnostic images or specimens that may result from electronic transmissions. I understand that the practice of medicine is not an exact science and that Practitioner makes no warranties or guarantees regarding treatment outcomes. I acknowledge that I will receive a copy  of this consent concurrently upon execution via email to the email address I last provided but may also request a printed copy by calling the office of [facility].    I understand that my insurance will be billed for this visit.   I have read or had this consent read to me.  I understand the contents of this consent, which adequately explains the benefits and risks of the Services being provided via telemedicine.   I have been provided ample opportunity to ask questions regarding this consent and the Services and have had my questions answered to my satisfaction.  I give my informed consent for the services to be provided through the use of  telemedicine in my medical care  By participating in this telemedicine visit I agree to the above.

## 2019-04-13 ENCOUNTER — Other Ambulatory Visit: Payer: Self-pay | Admitting: Physician Assistant

## 2019-04-13 DIAGNOSIS — Z1231 Encounter for screening mammogram for malignant neoplasm of breast: Secondary | ICD-10-CM

## 2019-04-22 NOTE — Progress Notes (Signed)
Virtual Visit via Video Note   This visit type was conducted due to national recommendations for restrictions regarding the COVID-19 Pandemic (e.g. social distancing) in an effort to limit this patient's exposure and mitigate transmission in our community.  Due to her co-morbid illnesses, this patient is at least at moderate risk for complications without adequate follow up.  This format is felt to be most appropriate for this patient at this time.  All issues noted in this document were discussed and addressed.  A limited physical exam was performed with this format.  Please refer to the patient's chart for her consent to telehealth for [facility].   I connected with  Barbara Mooney on 04/23/19 by a video enabled telemedicine application and verified that I am speaking with the correct person using two identifiers. I discussed the limitations of evaluation and management by telemedicine. The patient expressed understanding and agreed to proceed.   Evaluation Performed:  Follow-up visit  Date:  04/23/2019   ID:  Barbara Mooney, DOB May 05, 1934, MRN [id number]  Patient Location:  [place] [address]   Provider location:   [facility], [place] office  PCP:  Marinda Elk, MD  Cardiologist:  Arvid Right [facility]   Chief Complaint: Gait instability    History of Present Illness:    Barbara Mooney is a 85 y.o. female who presents via audio/video conferencing for a telehealth visit today.   The patient does not symptoms concerning for COVID-19 infection (fever, chills, cough, or new SHORTNESS OF BREATH).   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 Who presents for routine  follow-up of her persistent atrial fibrillation, bradycardia  On her last clinic visit had bradycardia, poor energy, It was recommended she stop her metoprolol Also diltiazem down to 180 daily rather than twice a day Recommended follow-up on blood pressure  Blood pressures at home 120s up to 150s pulses in the 70-90 range 109/69-150/96 Pulse 68-101  Fatigue, No significant swelling  Takes lasix as needed  Vertigo Goes away with Dramamine  Denies any dizziness, orthostasis, near syncope  Other past medical history reviewed Broke arm left in oct 2019   previously in the ED 10/28/16 with generalized weakness, fatigue, fever and chills  found to have sepsis secondary to a urinary source   hypotensive , atrial fibrillation  Echocardiogram done in the hospital   concentric hypertrophy. Systolic function was normal. EF 55% to 60%.   asymptomatic from her atrial fibrillation  Metoprolol and Cardizem added for rate control  Currently taking Eliquis 5mg  bid.    This patients CHA2DS2-VASc Score and unadjusted Ischemic Stroke Rate (% per year) is equal to 7.2 % stroke rate/year from a score of 5  Above score calculated as 1 point each if present [CHF, HTN, DM, Vascular=MI/PAD/Aortic Plaque, Age if 65-74, or Female] Above score calculated as 2 points each if present [Age > 75, or Stroke/TIA/TE]  Cardioversion on 01/20/2017, back to NSR    Prior CV studies:   The following studies were reviewed today:    Past Medical History:  Diagnosis Date  . Arthritis   . Bruises easily   . Diabetes mellitus   . Dysrhythmia  AFIB  . Edema    FEET/ LEGS  . GERD (gastroesophageal reflux disease)   . Hypertension   . Hypothyroidism   . Lymphoma ([place]) 12/2017   RADIATION RIGHT LEG LAST 1 WEEK AGO  . MRSA infection    4 TO 5 YEARS AGO ON BACK  . Multiple thyroid nodules   . Personal history of radiation therapy 12/2017   lymphoma   Past Surgical History:  Procedure Laterality  Date  . BONE BIOPSY     2018  . CARDIOVERSION N/A 01/20/2017   Procedure: CARDIOVERSION;  Surgeon: Minna Merritts, MD;  Location: [facility] ORS;  Service: Cardiovascular;  Laterality: N/A;  . CARDIOVERSION     AFIB 2018  . CATARACT EXTRACTION W/PHACO Right 02/15/2018   Procedure: CATARACT EXTRACTION PHACO AND INTRAOCULAR LENS PLACEMENT (IOC);  Surgeon: Birder Robson, MD;  Location: [facility] ORS;  Service: Ophthalmology;  Laterality: Right;  [place] 01:22.8 AP% 18.9 CDE 15.62 Fluid Pack Lot # [id number]  . CATARACT EXTRACTION W/PHACO Left 03/08/2018   Procedure: CATARACT EXTRACTION PHACO AND INTRAOCULAR LENS PLACEMENT ([place]);  Surgeon: Birder Robson, MD;  Location: [facility] ORS;  Service: Ophthalmology;  Laterality: Left;  [place] 01:05 AP% 15.8 CDE 10.28 Fluid pak lot # [id number]     Current Meds  Medication Sig  . acetaminophen (TYLENOL) 500 MG tablet Take 500 mg by mouth at bedtime.  Marland Kitchen apixaban (ELIQUIS) 5 MG TABS tablet Take 1 tablet (5 mg total) by mouth 2 (two) times daily.  Marland Kitchen CRANBERRY PO Take 1 capsule by mouth 2 (two) times daily.   . cyanocobalamin (,VITAMIN B-12,) 1000 MCG/ML injection Inject 1,000 mcg into the muscle every 30 (thirty) days.   Marland Kitchen diltiazem (CARDIZEM CD) 180 MG 24 hr capsule Take 1 capsule (180 mg total) by mouth daily.  Marland Kitchen estradiol (ESTRACE) 0.1 MG/GM vaginal cream Place 1 Applicatorful vaginally daily. Use pea sized amount M-W-Fr before bedtime  . furosemide (LASIX) 20 MG tablet Take 1 tablet (20 mg total) by mouth daily as needed for fluid or edema (SOB).  Marland Kitchen losartan (COZAAR) 100 MG tablet Take 1 tablet (100 mg total) by mouth daily.  . metFORMIN (GLUCOPHAGE) 500 MG tablet Take 500 mg by mouth 2 (two) times daily with a meal.  . omeprazole (PRILOSEC) 20 MG capsule Take 1 capsule (20 mg total) by mouth daily.  . potassium chloride SA (KLOR-CON M20) 20 MEQ tablet TAKE 1 TABLET (20 MEQ TOTAL) BY MOUTH AS DIRECTED.  Marland Kitchen traMADol (ULTRAM) 50 MG tablet Take 1 tablet (50 mg total) by  mouth every 6 (six) hours as needed.     Allergies:   Other   Social History   Tobacco Use  . Smoking status: Never Smoker  . Smokeless tobacco: Never Used  Substance Use Topics  . Alcohol use: No  . Drug use: No     Current Outpatient Medications on File Prior to Visit  Medication Sig Dispense Refill  . acetaminophen (TYLENOL) 500 MG tablet Take 500 mg by mouth at bedtime.    Marland Kitchen apixaban (ELIQUIS) 5 MG TABS tablet Take 1 tablet (5 mg total) by mouth 2 (two) times daily. 180 tablet 3  . CRANBERRY PO Take 1 capsule by mouth 2 (two) times daily.     . cyanocobalamin (,VITAMIN B-12,) 1000 MCG/ML injection Inject 1,000 mcg into the muscle every 30 (thirty) days.     Marland Kitchen diltiazem (CARDIZEM CD) 180 MG 24 hr capsule Take 1 capsule (180 mg total) by mouth daily. 90 capsule 3  .  estradiol (ESTRACE) 0.1 MG/GM vaginal cream Place 1 Applicatorful vaginally daily. Use pea sized amount M-W-Fr before bedtime 42.5 g 12  . furosemide (LASIX) 20 MG tablet Take 1 tablet (20 mg total) by mouth daily as needed for fluid or edema (SOB). 30 tablet 5  . losartan (COZAAR) 100 MG tablet Take 1 tablet (100 mg total) by mouth daily. 90 tablet 3  . metFORMIN (GLUCOPHAGE) 500 MG tablet Take 500 mg by mouth 2 (two) times daily with a meal.    . omeprazole (PRILOSEC) 20 MG capsule Take 1 capsule (20 mg total) by mouth daily. 30 capsule 6  . potassium chloride SA (KLOR-CON M20) 20 MEQ tablet TAKE 1 TABLET (20 MEQ TOTAL) BY MOUTH AS DIRECTED. 90 tablet 3  . traMADol (ULTRAM) 50 MG tablet Take 1 tablet (50 mg total) by mouth every 6 (six) hours as needed. 15 tablet 0  . spironolactone (ALDACTONE) 25 MG tablet Take 1 tablet (25 mg total) by mouth daily. 90 tablet 3   No current facility-administered medications on file prior to visit.      Family Hx: The patient's family history includes Arrhythmia in her sister; Breast cancer (age of onset: 72) in her sister; Heart disease in her brother; Ovarian cancer in her  maternal aunt; Stroke in her sister. There is no history of Kidney cancer or Bladder Cancer.  ROS:   Please see the history of present illness.    Review of Systems  Constitutional: Negative.   Respiratory: Negative.   Cardiovascular: Negative.   Gastrointestinal: Negative.   Musculoskeletal: Negative.   Neurological: Negative.   Psychiatric/Behavioral: Negative.   All other systems reviewed and are negative.     Labs/Other Tests and Data Reviewed:    Recent Labs: No results found for requested labs within last 8760 hours.   Recent Lipid Panel No results found for: CHOL, TRIG, HDL, CHOLHDL, LDLCALC, LDLDIRECT  Wt Readings from Last 3 Encounters:  02/01/19 168 lb 8 oz (76.4 kg)  01/10/19 168 lb 12.2 oz (76.5 kg)  12/18/18 169 lb 8 oz (76.9 kg)     Exam:    Vital Signs: Vital signs may also be detailed in the HPI There were no vitals taken for this visit.  Wt Readings from Last 3 Encounters:  02/01/19 168 lb 8 oz (76.4 kg)  01/10/19 168 lb 12.2 oz (76.5 kg)  12/18/18 169 lb 8 oz (76.9 kg)   Temp Readings from Last 3 Encounters:  01/10/19 97.7 F (36.5 C) (Tympanic)  09/15/18 97.7 F (36.5 C) (Oral)  07/05/18 (!) 97.4 F (36.3 C)   BP Readings from Last 3 Encounters:  02/01/19 120/62  01/10/19 (!) 185/83  12/18/18 (!) 148/70   Pulse Readings from Last 3 Encounters:  02/01/19 (!) 53  01/10/19 63  12/18/18 (!) 38     Well nourished, well developed female in no acute distress. Constitutional:  oriented to person, place, and time. No distress.  Head: Normocephalic and atraumatic.  Eyes:  no discharge. No scleral icterus.  Neck: Normal range of motion. Neck supple.  Pulmonary/Chest: No audible wheezing, no distress, appears comfortable Musculoskeletal: Normal range of motion.  no  tenderness or deformity.  Neurological:   Coordination normal. Full exam not performed Skin:  No rash Psychiatric:  normal mood and affect. behavior is normal. Thought content  normal.    ASSESSMENT & PLAN:     Paroxysmal atrial fibrillation (HCC) - Heart rate resolved by holding metoprolol Continue current dose of diltiazem On  anticoagulation  Essential hypertension Blood pressure is well controlled on today's visit. No changes made to the medications. Stable  Unsteady gait Previously completed PT Legs are weak after she broke her arm October 2019 Recommended regular walking program  Hyperlipidemia No recent labs available   COVID-19 Education: The signs and symptoms of COVID-19 were discussed with the patient and how to seek care for testing (follow up with PCP or arrange E-visit).  The importance of social distancing was discussed today.  Patient Risk:   After full review of this patients clinical status, I feel that they are at least moderate risk at this time.  Time:   Today, I have spent 25 minutes with the patient with telehealth technology discussing the cardiac and medical problems/diagnoses detailed above   10 min spent reviewing the chart prior to patient visit today   Medication Adjustments/Labs and Tests Ordered: Current medicines are reviewed at length with the patient today.  Concerns regarding medicines are outlined above.   Tests Ordered: No tests ordered   Medication Changes: No changes made  Disposition: Follow-up in 3 months   Signed, Ida Rogue, MD  04/23/2019 1:42 PM    [place] Office [address], [place], [place] [address]

## 2019-04-23 ENCOUNTER — Telehealth (INDEPENDENT_AMBULATORY_CARE_PROVIDER_SITE_OTHER): Payer: Medicare Other | Admitting: Cardiovascular Disease

## 2019-04-23 ENCOUNTER — Other Ambulatory Visit: Payer: Self-pay

## 2019-04-23 DIAGNOSIS — I10 Essential (primary) hypertension: Secondary | ICD-10-CM | POA: Diagnosis not present

## 2019-04-23 DIAGNOSIS — Z7189 Other specified counseling: Secondary | ICD-10-CM | POA: Diagnosis not present

## 2019-04-23 DIAGNOSIS — M7989 Other specified soft tissue disorders: Secondary | ICD-10-CM

## 2019-04-23 DIAGNOSIS — Z794 Long term (current) use of insulin: Secondary | ICD-10-CM

## 2019-04-23 DIAGNOSIS — E118 Type 2 diabetes mellitus with unspecified complications: Secondary | ICD-10-CM

## 2019-04-23 DIAGNOSIS — R001 Bradycardia, unspecified: Secondary | ICD-10-CM

## 2019-04-23 DIAGNOSIS — I480 Paroxysmal atrial fibrillation: Secondary | ICD-10-CM

## 2019-04-23 MED ORDER — POTASSIUM CHLORIDE CRYS ER 10 MEQ PO TBCR: 10.0000 meq | EXTENDED_RELEASE_TABLET | ORAL | 3 refills | Status: DC

## 2019-04-23 NOTE — Patient Instructions (Addendum)
Medication Instructions:  Your physician has recommended you make the following change in your medication:  1. DECREASE Potassium 10 mEq take one pill three days a week.  If you need a refill on your cardiac medications before your next appointment, please call your pharmacy.    Lab work: No new labs needed   If you have labs (blood work) drawn today and your tests are completely normal, you will receive your results only by: Marland Kitchen MyChart Message (if you have MyChart) OR . A paper copy in the mail If you have any lab test that is abnormal or we need to change your treatment, we will call you to review the results.   Testing/Procedures: No new testing needed   Follow-Up: At [facility], you and your health needs are our priority.  As part of our continuing mission to provide you with exceptional heart care, we have created designated Provider Care Teams.  These Care Teams include your primary Cardiologist (physician) and Advanced Practice Providers (APPs -  Physician Assistants and Nurse Practitioners) who all work together to provide you with the care you need, when you need it.  . You will need a follow up appointment in 3 months .   Please call our office 2 months in advance to schedule this appointment.    . Providers on your designated Care Team:   . Murray Hodgkins, NP . Christell Faith, PA-C . Marrianne Mood, PA-C  Any Other Special Instructions Will Be Listed Below (If Applicable).  For educational health videos Log in to : [email or web address] Or : [email or web address], password : [email or web address]

## 2019-06-10 ENCOUNTER — Other Ambulatory Visit: Payer: Self-pay | Admitting: Cardiovascular Disease

## 2019-06-19 DIAGNOSIS — I480 Paroxysmal atrial fibrillation: Secondary | ICD-10-CM

## 2019-06-25 ENCOUNTER — Telehealth: Payer: Self-pay | Admitting: Cardiovascular Disease

## 2019-06-25 NOTE — Telephone Encounter (Signed)
Lm to call back lab work not in my chart at this time .Barbara Mooney

## 2019-06-25 NOTE — Telephone Encounter (Signed)
Daughter sent labs via mychart for review please call.

## 2019-06-25 NOTE — Telephone Encounter (Signed)
Patient daughter [name] returning call

## 2019-06-26 ENCOUNTER — Encounter: Payer: Self-pay | Admitting: Cardiovascular Disease

## 2019-06-26 NOTE — Telephone Encounter (Signed)
This encounter was created in error - please disregard.

## 2019-06-26 NOTE — Telephone Encounter (Signed)
Spoke with the pt daughter [name]. Adv her of Dr.Gollans' recommendation. (previously sent through mychart)  Would redraw lab, bmp  Possible hemolysis from using a small needle in their office  Use different draw center  TG  Order is in Epic for repeat bmet. Mena Pauls that the pt ca have her labs drawn at [facility]. The lab is not fasting and they do not need an appt.  [name] verbalized understanding.

## 2019-07-05 ENCOUNTER — Other Ambulatory Visit: Payer: Self-pay

## 2019-07-05 ENCOUNTER — Other Ambulatory Visit
Admission: RE | Admit: 2019-07-05 | Discharge: 2019-07-05 | Disposition: A | Discharge status: Home / Self Care | Payer: Medicare Other | Source: Ambulatory Visit | Attending: Cardiovascular Disease | Admitting: Cardiovascular Disease

## 2019-07-05 ENCOUNTER — Ambulatory Visit
Admission: RE | Admit: 2019-07-05 | Discharge: 2019-07-05 | Disposition: A | Discharge status: Home / Self Care | Payer: Medicare Other | Source: Ambulatory Visit | Attending: Physician Assistant | Admitting: Physician Assistant

## 2019-07-05 DIAGNOSIS — Z1231 Encounter for screening mammogram for malignant neoplasm of breast: Secondary | ICD-10-CM | POA: Insufficient documentation

## 2019-07-05 DIAGNOSIS — I480 Paroxysmal atrial fibrillation: Secondary | ICD-10-CM | POA: Diagnosis present

## 2019-07-05 LAB — BASIC METABOLIC PANEL
Anion gap: 9 (ref 5–15)
BUN: 18 mg/dL (ref 8–23)
CO2: 24 mmol/L (ref 22–32)
Calcium: 9.9 mg/dL (ref 8.9–10.3)
Chloride: 101 mmol/L (ref 98–111)
Creatinine, Ser: 1.11 mg/dL — ABNORMAL HIGH (ref 0.44–1.00)
GFR calc Af Amer: 53 mL/min — ABNORMAL LOW (ref >60)
GFR calc non Af Amer: 46 mL/min — ABNORMAL LOW (ref >60)
Glucose, Bld: 104 mg/dL — ABNORMAL HIGH (ref 70–99)
Potassium: 4.9 mmol/L (ref 3.5–5.1)
Sodium: 134 mmol/L — ABNORMAL LOW (ref 135–145)

## 2019-07-18 ENCOUNTER — Other Ambulatory Visit: Payer: Self-pay | Admitting: Cardiovascular Disease

## 2019-08-21 ENCOUNTER — Other Ambulatory Visit: Payer: Self-pay | Admitting: Cardiovascular Disease

## 2019-10-25 ENCOUNTER — Other Ambulatory Visit: Payer: Self-pay | Admitting: Cardiovascular Disease

## 2019-11-26 ENCOUNTER — Ambulatory Visit: Payer: Medicare Other | Admitting: Cardiovascular Disease

## 2019-12-04 ENCOUNTER — Other Ambulatory Visit: Payer: Medicare Other

## 2019-12-31 NOTE — Progress Notes (Signed)
Date:  01/01/2020   ID:  Barbara Mooney, DOB May 28, 1934, MRN [id number]  Patient Location:  [place] [address]   Provider location:   [facility], [place] office  PCP:  Marinda Elk, MD  Cardiologist:  Arvid Right [facility]   Chief Complaint  Patient presents with  . other    3 month follow up. Meds reviewed by the pt. verbally. "doing well."    History of Present Illness:    Barbara Mooney is a 86 y.o. 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 Who presents for routine follow-up of her persistent atrial fibrillation, bradycardia  Takes care of sick husband He has polyuria/nocturia, she is getting broken sleep Somewhat tired No regular exercise program but stays active taking care of him Daughter presents with her today, reports no significant issues Denies any shortness of breath, leg edema, chest pain on exertion  Blood pressure stable, no orthostasis symptoms Medications cut back on prior clinic visit secondary to bradycardia Reports blood pressure well controlled at home  Lasix as needed No significant vertigo recently  EKG personally reviewed by myself on todays visit Shows atrial fibrillation rate 87 bpm right bundle branch block No change  Other past medical history reviewed Broke arm left in oct 2019   previously in the ED 10/28/16 with generalized weakness, fatigue, fever and chills  found to have sepsis secondary to a urinary source   hypotensive , atrial fibrillation  Echocardiogram done in the hospital   concentric hypertrophy. Systolic function was normal. EF 55% to 60%.   asymptomatic from her atrial fibrillation  Metoprolol and  Cardizem added for rate control  Currently taking Eliquis 5mg  bid.    This patients CHA2DS2-VASc Score and unadjusted Ischemic Stroke Rate (% per year) is equal to 7.2 % stroke rate/year from a score of 5  Above score calculated as 1 point each if present [CHF, HTN, DM, Vascular=MI/PAD/Aortic Plaque, Age if 65-74, or Female] Above score calculated as 2 points each if present [Age > 75, or Stroke/TIA/TE]  Cardioversion on 01/20/2017, back to NSR   Prior CV studies:   The following studies were reviewed today:    Past Medical History:  Diagnosis Date  . Arthritis   . Bruises easily   . Diabetes mellitus   . Dysrhythmia    AFIB  . Edema    FEET/ LEGS  . GERD (gastroesophageal reflux disease)   . Hypertension   . Hypothyroidism   . Lymphoma ([place]) 12/2017   RADIATION RIGHT LEG LAST 1 WEEK AGO  . MRSA infection    4 TO 5 YEARS AGO ON BACK  . Multiple thyroid nodules   . Personal history of radiation therapy 12/2017   lymphoma   Past Surgical History:  Procedure Laterality Date  . BONE BIOPSY     2018  . CARDIOVERSION N/A 01/20/2017   Procedure: CARDIOVERSION;  Surgeon: Minna Merritts, MD;  Location: [facility] ORS;  Service: Cardiovascular;  Laterality: N/A;  . CARDIOVERSION     AFIB 2018  . CATARACT EXTRACTION W/PHACO Right 02/15/2018   Procedure: CATARACT EXTRACTION PHACO AND INTRAOCULAR LENS PLACEMENT (IOC);  Surgeon: Birder Robson, MD;  Location:  [facility] ORS;  Service: Ophthalmology;  Laterality: Right;  [place] 01:22.8 AP% 18.9 CDE 15.62 Fluid Pack Lot # [id number]  . CATARACT EXTRACTION W/PHACO Left 03/08/2018   Procedure: CATARACT EXTRACTION PHACO AND INTRAOCULAR LENS PLACEMENT ([place]);  Surgeon: Birder Robson, MD;  Location: [facility] ORS;  Service: Ophthalmology;  Laterality: Left;  [place] 01:05 AP% 15.8 CDE 10.28 Fluid pak lot # [id number]     Current Meds  Medication Sig  . acetaminophen (TYLENOL) 500 MG tablet Take 500 mg by mouth at bedtime.  Marland Kitchen apixaban (ELIQUIS) 5 MG TABS  tablet Take 1 tablet (5 mg total) by mouth 2 (two) times daily.  . ciprofloxacin (CIPRO) 250 MG tablet Take 250 mg by mouth 2 (two) times daily.   Marland Kitchen CRANBERRY PO Take 1 capsule by mouth 2 (two) times daily.   . cyanocobalamin (,VITAMIN B-12,) 1000 MCG/ML injection Inject 1,000 mcg into the muscle every 30 (thirty) days.   Marland Kitchen diltiazem (CARDIZEM CD) 180 MG 24 hr capsule TAKE 1 CAPSULE BY MOUTH TWICE A DAY  . furosemide (LASIX) 20 MG tablet TAKE 1 TABLET (20 MG TOTAL) BY MOUTH DAILY AS NEEDED FOR FLUID OR EDEMA (SOB).  Marland Kitchen losartan (COZAAR) 100 MG tablet Take 1 tablet (100 mg total) by mouth daily.  . metFORMIN (GLUCOPHAGE) 500 MG tablet Take 500 mg by mouth 2 (two) times daily with a meal.  . omeprazole (PRILOSEC) 20 MG capsule Take 1 capsule (20 mg total) by mouth daily.  . potassium chloride (K-DUR) 10 MEQ tablet Take 1 tablet (10 mEq total) by mouth as directed. Take 1 tablet three days a week.  . spironolactone (ALDACTONE) 25 MG tablet TAKE 1 TABLET BY MOUTH EVERY DAY     Allergies:   Nitrofurantoin and Other   Social History   Tobacco Use  . Smoking status: Never Smoker  . Smokeless tobacco: Never Used  Substance Use Topics  . Alcohol use: No  . Drug use: No     Current Outpatient Medications on File Prior to Visit  Medication Sig Dispense Refill  . acetaminophen (TYLENOL) 500 MG tablet Take 500 mg by mouth at bedtime.    Marland Kitchen apixaban (ELIQUIS) 5 MG TABS tablet Take 1 tablet (5 mg total) by mouth 2 (two) times daily. 180 tablet 3  . ciprofloxacin (CIPRO) 250 MG tablet Take 250 mg by mouth 2 (two) times daily.     Marland Kitchen CRANBERRY PO Take 1 capsule by mouth 2 (two) times daily.     . cyanocobalamin (,VITAMIN B-12,) 1000 MCG/ML injection Inject 1,000 mcg into the muscle every 30 (thirty) days.     Marland Kitchen diltiazem (CARDIZEM CD) 180 MG 24 hr capsule TAKE 1 CAPSULE BY MOUTH TWICE A DAY 180 capsule 0  . furosemide (LASIX) 20 MG tablet TAKE 1 TABLET (20 MG TOTAL) BY MOUTH DAILY AS NEEDED FOR FLUID OR  EDEMA (SOB). 90 tablet 0  . losartan (COZAAR) 100 MG tablet Take 1 tablet (100 mg total) by mouth daily. 90 tablet 3  . metFORMIN (GLUCOPHAGE) 500 MG tablet Take 500 mg by mouth 2 (two) times daily with a meal.    . omeprazole (PRILOSEC) 20 MG capsule Take 1 capsule (20 mg total) by mouth daily. 30 capsule 6  . potassium chloride (K-DUR) 10 MEQ tablet Take 1 tablet (10 mEq total) by mouth as directed. Take 1 tablet three days a week. 36 tablet 3  . spironolactone (ALDACTONE) 25 MG tablet TAKE 1 TABLET BY MOUTH EVERY DAY 90 tablet 0  No current facility-administered medications on file prior to visit.     Family Hx: The patient's family history includes Arrhythmia in her sister; Breast cancer (age of onset: 64) in her sister; Heart disease in her brother; Ovarian cancer in her maternal aunt; Stroke in her sister. There is no history of Kidney cancer or Bladder Cancer.  ROS:   Please see the history of present illness.    Review of Systems  Constitutional: Negative.   HENT: Negative.   Respiratory: Negative.   Cardiovascular: Negative.   Gastrointestinal: Negative.   Musculoskeletal: Negative.   Neurological: Negative.   Psychiatric/Behavioral: Negative.   All other systems reviewed and are negative.    Labs/Other Tests and Data Reviewed:    Recent Labs: 07/05/2019: BUN 18; Creatinine, Ser 1.11; Potassium 4.9; Sodium 134   Recent Lipid Panel No results found for: CHOL, TRIG, HDL, CHOLHDL, LDLCALC, LDLDIRECT  Wt Readings from Last 3 Encounters:  01/01/20 176 lb 12 oz (80.2 kg)  02/01/19 168 lb 8 oz (76.4 kg)  01/10/19 168 lb 12.2 oz (76.5 kg)     Exam:    Vital Signs: BP 110/60 (BP Location: Left Arm, Patient Position: Sitting, Cuff Size: Normal)   Pulse 87   Ht 5\' 5"  (1.651 m)   Wt 176 lb 12 oz (80.2 kg)   BMI 29.41 kg/m   Constitutional:  oriented to person, place, and time. No distress.  HENT:  Head: Grossly normal Eyes:  no discharge. No scleral icterus.  Neck:  No JVD, no carotid bruits  Cardiovascular: Irregularly irregular no murmurs appreciated Pulmonary/Chest: Clear to auscultation bilaterally, no wheezes or rails Abdominal: Soft.  no distension.  no tenderness.  Musculoskeletal: Normal range of motion Neurological:  normal muscle tone. Coordination normal. No atrophy Skin: Skin warm and dry Psychiatric: normal affect, pleasant  ASSESSMENT & PLAN:     Paroxysmal atrial fibrillation (HCC) - Continue diltiazem, rate well controlled Recommend she monitor low blood pressure  Essential hypertension Blood pressure low but reports it is typically 130 at home She will continue to monitor pressure and for signs of orthostasis  Unsteady gait Previously completed PT Legs are weak after she broke her arm October 2019 Recommended she start walking program with family as tolerated  Hyperlipidemia No recent labs available Long discussion concerning whether to treat lipids No CT scans available for review to look for premature coronary disease or calcification She is inclined not to be on a pill   Total encounter time more than 25 minutes  Greater than 50% was spent in counseling and coordination of care with the patient   Disposition: Follow-up in 12 months   Signed, Ida Rogue, MD  01/01/2020 2:46 PM    [place] [facility] [address], [place], [place] [address]

## 2020-01-01 ENCOUNTER — Other Ambulatory Visit: Payer: Self-pay

## 2020-01-01 ENCOUNTER — Ambulatory Visit (INDEPENDENT_AMBULATORY_CARE_PROVIDER_SITE_OTHER): Payer: Medicare Other | Admitting: Cardiovascular Disease

## 2020-01-01 ENCOUNTER — Encounter: Payer: Self-pay | Admitting: Cardiovascular Disease

## 2020-01-01 VITALS — BP 110/60 | HR 87 | Ht 65.0 in | Wt 176.8 lb

## 2020-01-01 DIAGNOSIS — I10 Essential (primary) hypertension: Secondary | ICD-10-CM

## 2020-01-01 DIAGNOSIS — I480 Paroxysmal atrial fibrillation: Secondary | ICD-10-CM | POA: Diagnosis not present

## 2020-01-01 DIAGNOSIS — E118 Type 2 diabetes mellitus with unspecified complications: Secondary | ICD-10-CM

## 2020-01-01 DIAGNOSIS — I4892 Unspecified atrial flutter: Secondary | ICD-10-CM

## 2020-01-01 DIAGNOSIS — Z794 Long term (current) use of insulin: Secondary | ICD-10-CM

## 2020-01-01 DIAGNOSIS — M7989 Other specified soft tissue disorders: Secondary | ICD-10-CM

## 2020-01-01 DIAGNOSIS — D649 Anemia, unspecified: Secondary | ICD-10-CM

## 2020-01-01 NOTE — Patient Instructions (Signed)

## 2020-01-05 ENCOUNTER — Encounter: Payer: Self-pay | Admitting: Radiation Oncology

## 2020-01-23 ENCOUNTER — Ambulatory Visit: Payer: Medicare Other | Admitting: Radiation Oncology

## 2020-01-23 ENCOUNTER — Other Ambulatory Visit: Payer: Self-pay | Admitting: Cardiovascular Disease

## 2020-01-25 ENCOUNTER — Other Ambulatory Visit: Payer: Self-pay

## 2020-01-28 ENCOUNTER — Encounter: Payer: Self-pay | Admitting: Radiation Oncology

## 2020-01-28 ENCOUNTER — Ambulatory Visit
Admission: RE | Admit: 2020-01-28 | Discharge: 2020-01-28 | Disposition: A | Discharge status: Home / Self Care | Payer: Medicare Other | Source: Ambulatory Visit | Attending: Radiation Oncology | Admitting: Radiation Oncology

## 2020-01-28 ENCOUNTER — Other Ambulatory Visit: Payer: Self-pay

## 2020-01-28 VITALS — BP 117/66 | HR 73 | Temp 98.3°F | Resp 16 | Wt 176.9 lb

## 2020-01-28 DIAGNOSIS — Z923 Personal history of irradiation: Secondary | ICD-10-CM | POA: Diagnosis not present

## 2020-01-28 DIAGNOSIS — C8299 Follicular lymphoma, unspecified, extranodal and solid organ sites: Secondary | ICD-10-CM

## 2020-01-28 DIAGNOSIS — C8205 Follicular lymphoma grade I, lymph nodes of inguinal region and lower limb: Secondary | ICD-10-CM | POA: Diagnosis present

## 2020-01-28 NOTE — Progress Notes (Signed)
Radiation Oncology Follow up Note  Name: [name]   Date:   01/28/2020 MRN:  [id number] DOB: [birth year]    This 86 y.o. female presents to the clinic today for 2-year follow-up status post localized radiation therapy to right knee for stage I follicular lymphoma.  REFERRING PROVIDER: Marinda Elk, MD  HPI: Patient is an 86 year old female now at 2 years having completed radiation therapy to her right knee for stage I follicular lymphoma.  Seen today in routine follow-up she is doing well she states her arthritis in her lower extremities has worsened.  She does have some slight pain in the posterior aspect of her right knee.  She is not had any follow-up scans.  She specifically denies any fever chills or night sweats..  COMPLICATIONS OF TREATMENT: none  FOLLOW UP COMPLIANCE: keeps appointments   PHYSICAL EXAM:  BP 117/66   Pulse 73   Temp 98.3 F (36.8 C) (Tympanic)   Resp 16   Wt 176 lb 14.4 oz (80.2 kg)   BMI 29.44 kg/m  No evidence of peripheral adenopathy is noted.  Range of motion of the right knee does not elicit pain.  No evidence of inguinal adenopathy is identified.  Well-developed well-nourished patient in NAD. HEENT reveals PERLA, EOMI, discs not visualized.  Oral cavity is clear. No oral mucosal lesions are identified. Neck is clear without evidence of cervical or supraclavicular adenopathy. Lungs are clear to A&P. Cardiac examination is essentially unremarkable with regular rate and rhythm without murmur rub or thrill. Abdomen is benign with no organomegaly or masses noted. Motor sensory and DTR levels are equal and symmetric in the upper and lower extremities. Cranial nerves II through XII are grossly intact. Proprioception is intact. No peripheral adenopathy or edema is identified. No motor or sensory levels are noted. Crude visual fields are within normal range.  RADIOLOGY RESULTS: No current films to review  PLAN: At this time patient's follow-up is being  conducted at [facility], to turn follow-up care over to them.  I would at some point probably repeat her PET CT scan but I will leave that up to her clinicians at [facility].  Patient and family know to contact me at any time.  I would like to take this opportunity to thank you for allowing me to participate in the care of your patient.Noreene Filbert, MD

## 2020-03-02 ENCOUNTER — Other Ambulatory Visit: Payer: Self-pay | Admitting: Cardiovascular Disease

## 2020-03-05 ENCOUNTER — Other Ambulatory Visit: Payer: Self-pay | Admitting: Cardiovascular Disease

## 2020-03-05 NOTE — Telephone Encounter (Signed)
Please review for refill. Thanks!  

## 2020-03-05 NOTE — Telephone Encounter (Signed)
Pt's age 86, wt 80.2 kg, SCr 1.3 (12/27/19), CrCl 51.51, last ov w/ TG 01/01/20.

## 2020-05-05 ENCOUNTER — Other Ambulatory Visit: Payer: Self-pay | Admitting: Physician Assistant

## 2020-05-05 DIAGNOSIS — Z1231 Encounter for screening mammogram for malignant neoplasm of breast: Secondary | ICD-10-CM

## 2020-06-19 ENCOUNTER — Other Ambulatory Visit: Payer: Self-pay | Admitting: Cardiovascular Disease

## 2020-07-09 ENCOUNTER — Ambulatory Visit
Admission: RE | Admit: 2020-07-09 | Discharge: 2020-07-09 | Disposition: A | Discharge status: Home / Self Care | Payer: Medicare Other | Source: Ambulatory Visit | Attending: Physician Assistant | Admitting: Physician Assistant

## 2020-07-09 DIAGNOSIS — Z1231 Encounter for screening mammogram for malignant neoplasm of breast: Secondary | ICD-10-CM | POA: Insufficient documentation

## 2020-07-10 NOTE — Telephone Encounter (Signed)
Daughter calling with additional information re labs and meds   Recent labs done .  Holding Potassium .  Decreased to 1/2 dose losartan and plan to recheck labs.  Patient daughter wants Rockey Situ input due to afib hx

## 2020-07-10 NOTE — Telephone Encounter (Signed)
Spoke with patients daughter per release form. She sent in message with her concerns regarding recent labs. They instructed her to cut losartan in half and also to stop her current potassium. She wanted Dr. Rockey Situ to be aware of these changes and does have appointment with APP here in our office in 2 weeks. Advised that she should have her monitor blood pressures and if they remain consistently greater than 140/90 to please give [place] a call. Let her know that I would call if provider has any recommendations and if not then she can also review at upcoming appointment. She verbalized understanding of our conversation, agreement with plan, and had no further questions at this time.

## 2020-07-23 ENCOUNTER — Other Ambulatory Visit: Payer: Self-pay

## 2020-07-23 ENCOUNTER — Encounter: Payer: Self-pay | Admitting: Family

## 2020-07-23 ENCOUNTER — Ambulatory Visit (INDEPENDENT_AMBULATORY_CARE_PROVIDER_SITE_OTHER): Payer: Medicare Other | Admitting: Family

## 2020-07-23 VITALS — BP 110/60 | HR 72 | Ht 65.0 in | Wt 173.1 lb

## 2020-07-23 DIAGNOSIS — R5383 Other fatigue: Secondary | ICD-10-CM

## 2020-07-23 DIAGNOSIS — E875 Hyperkalemia: Secondary | ICD-10-CM

## 2020-07-23 DIAGNOSIS — Z7901 Long term (current) use of anticoagulants: Secondary | ICD-10-CM

## 2020-07-23 DIAGNOSIS — N1832 Chronic kidney disease, stage 3b: Secondary | ICD-10-CM

## 2020-07-23 DIAGNOSIS — E871 Hypo-osmolality and hyponatremia: Secondary | ICD-10-CM

## 2020-07-23 DIAGNOSIS — I739 Peripheral vascular disease, unspecified: Secondary | ICD-10-CM

## 2020-07-23 DIAGNOSIS — M79604 Pain in right leg: Secondary | ICD-10-CM

## 2020-07-23 DIAGNOSIS — I872 Venous insufficiency (chronic) (peripheral): Secondary | ICD-10-CM

## 2020-07-23 DIAGNOSIS — R0600 Dyspnea, unspecified: Secondary | ICD-10-CM

## 2020-07-23 DIAGNOSIS — I4821 Permanent atrial fibrillation: Secondary | ICD-10-CM

## 2020-07-23 DIAGNOSIS — M79605 Pain in left leg: Secondary | ICD-10-CM

## 2020-07-23 DIAGNOSIS — R350 Frequency of micturition: Secondary | ICD-10-CM

## 2020-07-23 DIAGNOSIS — R110 Nausea: Secondary | ICD-10-CM

## 2020-07-23 MED ORDER — SPIRONOLACTONE 25 MG PO TABS: 12.5000 mg | ORAL_TABLET | Freq: Every day | ORAL | Status: DC

## 2020-07-23 NOTE — Patient Instructions (Signed)
Medication Instructions:  Your physician has recommended you make the following change in your medication:   REDUCE Spironolactone (Aldactone) to 12.5mg  (half tablet) daily  *If you need a refill on your cardiac medications before your next appointment, please call your pharmacy*  Lab Work: Your provider lab work today: BMP, TSH  If you have labs (blood work) drawn today and your tests are completely normal, you will receive your results only by: Marland Kitchen MyChart Message (if you have MyChart) OR . A paper copy in the mail If you have any lab test that is abnormal or we need to change your treatment, we will call you to review the results.   Testing/Procedures: Your EKG today shows rate controlled atrial fibrillation.  Your physician has requested that you have an echocardiogram. Echocardiography is a painless test that uses sound waves to create images of your heart. It provides your doctor with information about the size and shape of your heart and how well your heart's chambers and valves are working. This procedure takes approximately one hour. There are no restrictions for this procedure.  Your physician has requested that you have a lower extremity arterial exercise duplex. During this test, exercise and ultrasound are used to evaluate arterial blood flow in the legs. Allow one hour for this exam. There are no restrictions or special instructions.  Follow-Up: At [facility], you and your health needs are our priority.  As part of our continuing mission to provide you with exceptional heart care, we have created designated Provider Care Teams.  These Care Teams include your primary Cardiologist (physician) and Advanced Practice Providers (APPs -  Physician Assistants and Nurse Practitioners) who all work together to provide you with the care you need, when you need it.  We recommend signing up for the patient portal called "MyChart".  Sign up information is provided on this After Visit  Summary.  MyChart is used to connect with patients for Virtual Visits (Telemedicine).  Patients are able to view lab/test results, encounter notes, upcoming appointments, etc.  Non-urgent messages can be sent to your provider as well.   To learn more about what you can do with MyChart, go to [email or web address].    Your next appointment:   6 week(s)  The format for your next appointment:   In Person  Provider:   You may see Ida Rogue, MD or one of the following Advanced Practice Providers on your designated Care Team:    Murray Hodgkins, NP  Christell Faith, PA-C  Marrianne Mood, PA-C  Hettie Roselli, NP  Other Instructions  Recommend less than 2 L of fluid per day. This is approximately 4 sixteen oz water bottles.  We may consider further reducing Losartan, but will wait until Monday to reassess your blood pressure via mychart message.

## 2020-07-23 NOTE — Progress Notes (Signed)
Office Visit    Patient Name: Barbara Mooney Date of Encounter: 07/23/2020  Primary Care Provider:  Marinda Elk, MD Primary Cardiologist:  Ida Rogue, MD Electrophysiologist:  None   Chief Complaint    Lenetta Quaker is a 86 y.o. female with a hx of HTN, DM 2, GERD, atrial fibrillation on Eliquis, follicular lymphoma, hyperkalemia, HLD presents today for fatigue  Past Medical History    Past Medical History:  Diagnosis Date  . Arthritis   . Bruises easily   . Diabetes mellitus   . Dysrhythmia    AFIB  . Edema    FEET/ LEGS  . GERD (gastroesophageal reflux disease)   . Hypertension   . Hypothyroidism   . Lymphoma ([place]) 12/2017   RADIATION RIGHT LEG LAST 1 WEEK AGO  . MRSA infection    4 TO 5 YEARS AGO ON BACK  . Multiple thyroid nodules   . Personal history of radiation therapy 12/2017   lymphoma   Past Surgical History:  Procedure Laterality Date  . BONE BIOPSY     2018  . CARDIOVERSION N/A 01/20/2017   Procedure: CARDIOVERSION;  Surgeon: Minna Merritts, MD;  Location: [facility] ORS;  Service: Cardiovascular;  Laterality: N/A;  . CARDIOVERSION     AFIB 2018  . CATARACT EXTRACTION W/PHACO Right 02/15/2018   Procedure: CATARACT EXTRACTION PHACO AND INTRAOCULAR LENS PLACEMENT (IOC);  Surgeon: Birder Robson, MD;  Location: [facility] ORS;  Service: Ophthalmology;  Laterality: Right;  [place] 01:22.8 AP% 18.9 CDE 15.62 Fluid Pack Lot # [id number]  . CATARACT EXTRACTION W/PHACO Left 03/08/2018   Procedure: CATARACT EXTRACTION PHACO AND INTRAOCULAR LENS PLACEMENT ([place]);  Surgeon: Birder Robson, MD;  Location: [facility] ORS;  Service: Ophthalmology;  Laterality: Left;  [place] 01:05 AP% 15.8 CDE 10.28 Fluid pak lot # [id number]    Allergies  Allergies  Allergen Reactions  . Nitrofurantoin Diarrhea, Nausea Only and Other (See Comments)  . Other     History of Present Illness    Barbara Mooney is a 86 y.o. female with a hx of HTN, DM 2, GERD, permanent atrial  fibrillation on Eliquis, RBBB, follicular lymphoma, hyperkalemia, B12 deficiency, HLD last seen 01/01/2020 by Dr. Rockey Situ.  Previous cardiac testing close echocardiogram 2017 with EF 55 to 60%, mild LVH, normal wall motion, no significant valvular abnormalities.  She was diagnosed with sepsis November 2017.  She was noted to be in atrial fibrillation with RVR.  Underwent cardioversion 01/2017.  However it subsequently returned to atrial fibrillation and patient made decision not to pursue restoring NSR as her atrial fibrillation is overall asymptomatic.  Metoprolol has previously been discontinued for bradycardia.  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  She has been following with her primary care provider regarding hyperkalemia.  Her potassium has been stopped and losartan reduced.  Most recent lab work 07/17/2020 via care everywhere with K5.4, creatinine 1.20, GFR 43.  She has also been following for a UTI and was started on Ceftin as it was resistant to Cipro.  Previous labs via care everywhere 06/25/2020   total cholesterol 213, triglycerides 118, LDL 136, HDL 53.4  Hemoglobin 13.7, hematocrit 42  K5.6, creatinine 1.3, GFR 39, AST 13, ALT 10  A1c 7  Labs 12/27/19 with TSH 1.95.   Reports fatigue has been longstanding over the last few months. Report she will have good days and bad days in regards to her energy. Denies difficulty sleeping.   Reports she  is nauseous when she gets up in the morning. Does subside later in the morning. She is on her second course of antibiotics for UTI. We discussed that antibiotics could be contributory and encouraged to take with food and consider probiotic.  Reports bilateral leg pain. The pain is primarily below her knee. It is tender on palpation. Reports it is worse with ambulation and better with rest.   Reports no shortness of breath. Endorses stable dyspnea on exertion. Reports no chest pain, pressure, or tightness. No  orthopnea, PND. Reports no palpitations.   Also endorses urinary frequency. She has previously followed with urology and has previously had pessary, but no longer in place. Presently on antibiotics for UTI.   EKGs/Labs/Other Studies Reviewed:   The following studies were reviewed today:  Echo 10/2016 Left ventricle: The cavity size was normal. There was mild    concentric hypertrophy. Systolic function was normal. The    estimated ejection fraction was in the range of 55% to 60%. Wall    motion was normal; there were no regional wall motion    abnormalities.  - Aortic valve: Transvalvular velocity was within the normal range.    There was no stenosis. There was no regurgitation.  - Mitral valve: Calcified annulus. Transvalvular velocity was    within the normal range. There was no evidence for stenosis.    There was trivial regurgitation.  - Left atrium: The atrium was moderately dilated.  - Right ventricle: The cavity size was normal. Wall thickness was    normal. Systolic function was normal.  - Atrial septum: No defect or patent foramen ovale was identified    by color flow Doppler.  - Tricuspid valve: There was mild regurgitation.  - Pulmonary arteries: Systolic pressure was within the normal    range. PA peak pressure: 35 mm Hg (S).   EKG:  EKG is  ordered today.  The ekg ordered today demonstrates rate controlled atrial fib 72 bpm with known RBBB and no acute ST/T wave changes.   Recent Labs: No results found for requested labs within last 8760 hours.  Recent Lipid Panel No results found for: CHOL, TRIG, HDL, CHOLHDL, VLDL, LDLCALC, LDLDIRECT  Home Medications   Current Meds  Medication Sig  . acetaminophen (TYLENOL) 500 MG tablet Take 500 mg by mouth at bedtime.  Marland Kitchen CRANBERRY PO Take 1 capsule by mouth 2 (two) times daily.   . cyanocobalamin (,VITAMIN B-12,) 1000 MCG/ML injection Inject 1,000 mcg into the muscle every 30 (thirty) days.   Marland Kitchen diltiazem (CARDIZEM CD) 180  MG 24 hr capsule TAKE 1 CAPSULE BY MOUTH TWICE A DAY  . ELIQUIS 5 MG TABS tablet TAKE 1 TABLET BY MOUTH TWICE A DAY  . furosemide (LASIX) 20 MG tablet TAKE 1 TABLET (20 MG TOTAL) BY MOUTH DAILY AS NEEDED FOR FLUID OR EDEMA (SOB).  Marland Kitchen losartan (COZAAR) 100 MG tablet Take 1 tablet (100 mg total) by mouth daily. (Patient taking differently: Take 50 mg by mouth daily. )  . metFORMIN (GLUCOPHAGE) 500 MG tablet Take 500 mg by mouth 2 (two) times daily with a meal.  . omeprazole (PRILOSEC) 20 MG capsule Take 1 capsule (20 mg total) by mouth daily.  Marland Kitchen spironolactone (ALDACTONE) 25 MG tablet Take 0.5 tablets (12.5 mg total) by mouth daily.  . [DISCONTINUED] spironolactone (ALDACTONE) 25 MG tablet TAKE 1 TABLET BY MOUTH EVERY DAY      Review of Systems    Review of Systems  Constitutional: Positive for malaise/fatigue.  Negative for chills and fever.  Cardiovascular: Positive for claudication and dyspnea on exertion. Negative for chest pain, irregular heartbeat, leg swelling, near-syncope, orthopnea, palpitations and syncope.  Respiratory: Negative for cough, shortness of breath and wheezing.   Musculoskeletal:       (+) bilateral leg pain  Gastrointestinal: Positive for nausea. Negative for melena and vomiting.  Genitourinary: Positive for frequency. Negative for hematuria.  Neurological: Negative for dizziness, light-headedness and weakness.   All other systems reviewed and are otherwise negative except as noted above.  Physical Exam    VS:  BP 110/60 (BP Location: Left Arm, Patient Position: Sitting, Cuff Size: Normal)   Pulse 72   Ht 5\' 5"  (1.651 m)   Wt 173 lb 2 oz (78.5 kg)   SpO2 98%   BMI 28.81 kg/m  , BMI Body mass index is 28.81 kg/m. GEN: Well nourished, overweight, well developed, in no acute distress. HEENT: normal. Neck: Supple, no JVD, carotid bruits, or masses. Cardiac: Irregularly irregular, no murmurs, rubs, or gallops. No clubbing, cyanosis, edema.  Radials/PT 2+ and  equal bilaterally.  Respiratory:  Respirations regular and unlabored, clear to auscultation bilaterally. GI: Soft, nontender, nondistended.  MS: No deformity or atrophy. Skin: Warm and dry, no rash. Bilateral LE with varicose veins. Neuro:  Strength and sensation are intact. Psych: Normal affect.  Assessment & Plan    1. Fatigue - Likely multifactorial, differential diagnosis includes depression due to loss of her husband in May, deconditioning. Plan for echocardiogram as her last was 2017 for reassessment of LVEF and any valvular abnormalities that may be contributory. TSH today as last checked 12/2019. 06/2020 labs include CBC without evidence of anemia. Continue B12 injections with her PCP, may also be contributory.   2. Hyperkalemia - 07/17/20 K 5.4. Remain off potassium supplement - change made by PCP. Would not recommend Gatorade or other electrolyte supplements. Avoid salt substitute. BMP today for monitoring. Losartan previously reduced, continue 50mg  daily. Reduce Spironolactone to 12.5mg  daily. Pending results of BMP ultimately may need to discontinue.   3. Hyponatremia - 2L fluid restriction encouraged. Low sodium diet encouraged. BMP today for monitoring. Recommend remain off Lasix.   4. HTN - BP well controlled. Home SBP 100-130. Continue Losartan to 50mg  daily. Her BP is on th elow end of normal and may be contributory to her fatigue. Will request report of BP Monday as we have reduced her Spironolactone today and may be able to down-titrate Losartan to 25mg .   5. CKD - Careful titration of diuretics and antihypertensives. BMP today for monitoring.   6. Nausea - Over the last few weeks. Concern her abx for UTI may be contributory as well as electrolyte abnormalities. Recommend taking abx with food. Encouraged yogurt or OTC probiotic. Continue to monitor.   7. Urinary frequency/UTI - Encouraged to complete course of abx. Urinary frequency may be exacerbated by present infection. Will  continue to monitor. May benefit from seeing urology again as she had previous pessary device.   8. Claudication/Bilateral LE pain/varicose veins - Notes leg pain with ambulation resolved by rest. Plan for LE duplex for assessment of PAD. However, she also has pain on palpation and noted varicose vein. If LE duplex unrevealing, consider referral to vascular. Recommend elevating lower extremities when sitting and walking regimen.   9. Permanent atrial fibrillation and chronic anticoagulation - rate controlled on EKG today. As her atrial fibrillation is permanent low suspicion it is contributory to her present fatigue. Denies bleeding on Eliquis 5mg   BID - does not meet criteria for dose reduction. Recent CBC 06/25/20 with Hb 13.7.   Disposition: Follow up in 6 week(s) with Dr. Rockey Situ or APP  Loel Dubonnet, NP 07/23/2020, 7:27 PM

## 2020-07-24 ENCOUNTER — Encounter: Payer: Self-pay | Admitting: Family

## 2020-07-24 DIAGNOSIS — I10 Essential (primary) hypertension: Secondary | ICD-10-CM

## 2020-07-24 DIAGNOSIS — E875 Hyperkalemia: Secondary | ICD-10-CM

## 2020-07-24 LAB — BASIC METABOLIC PANEL
BUN/Creatinine Ratio: 23 (ref 12–28)
BUN: 25 mg/dL (ref 8–27)
CO2: 20 mmol/L (ref 20–29)
Calcium: 9.7 mg/dL (ref 8.7–10.3)
Chloride: 93 mmol/L — ABNORMAL LOW (ref 96–106)
Creatinine, Ser: 1.1 mg/dL — ABNORMAL HIGH (ref 0.57–1.00)
GFR calc Af Amer: 53 mL/min/{1.73_m2} — ABNORMAL LOW (ref >59)
GFR calc non Af Amer: 46 mL/min/{1.73_m2} — ABNORMAL LOW (ref >59)
Glucose: 113 mg/dL — ABNORMAL HIGH (ref 65–99)
Potassium: 5.8 mmol/L — ABNORMAL HIGH (ref 3.5–5.2)
Sodium: 127 mmol/L — ABNORMAL LOW (ref 134–144)

## 2020-07-24 LAB — TSH: TSH: 1.07 u[IU]/mL (ref 0.450–4.500)

## 2020-07-24 MED ORDER — LOSARTAN POTASSIUM 25 MG PO TABS
25.0000 mg | ORAL_TABLET | Freq: Every day | ORAL | 2 refills | Status: DC
Start: 2020-07-24 — End: 2021-01-12

## 2020-07-24 NOTE — Telephone Encounter (Signed)
Spoke with patient's daughter, ok per DPR.  She verbalized understanding to stop spironolactone and decrease patient's losartan to 25 mg daily. She is aware patient will needs repeat BMET lab work at the [facility] this afternoon or tomorrow. She will probably bring the patient tomorrow.   She also realized the patient put the potassium back into her pill boxes this week. Patient took potassium on Monday and Wednesday this week. Daughter usually goes behind patient to check her pill boxes.  Routing to [place] to make her aware that patient took potassium this week.  Patient has not taken spironolactone today.

## 2020-07-25 ENCOUNTER — Other Ambulatory Visit
Admission: RE | Admit: 2020-07-25 | Discharge: 2020-07-25 | Disposition: A | Discharge status: Home / Self Care | Payer: Medicare Other | Attending: Family | Admitting: Family

## 2020-07-25 DIAGNOSIS — E875 Hyperkalemia: Secondary | ICD-10-CM | POA: Insufficient documentation

## 2020-07-25 DIAGNOSIS — I10 Essential (primary) hypertension: Secondary | ICD-10-CM | POA: Insufficient documentation

## 2020-07-25 LAB — BASIC METABOLIC PANEL
Anion gap: 10 (ref 5–15)
BUN: 28 mg/dL — ABNORMAL HIGH (ref 8–23)
CO2: 24 mmol/L (ref 22–32)
Calcium: 9.5 mg/dL (ref 8.9–10.3)
Chloride: 94 mmol/L — ABNORMAL LOW (ref 98–111)
Creatinine, Ser: 1.27 mg/dL — ABNORMAL HIGH (ref 0.44–1.00)
GFR calc Af Amer: 45 mL/min — ABNORMAL LOW (ref >60)
GFR calc non Af Amer: 39 mL/min — ABNORMAL LOW (ref >60)
Glucose, Bld: 93 mg/dL (ref 70–99)
Potassium: 4.8 mmol/L (ref 3.5–5.1)
Sodium: 128 mmol/L — ABNORMAL LOW (ref 135–145)

## 2020-07-28 ENCOUNTER — Encounter: Payer: Self-pay | Admitting: Family

## 2020-08-08 ENCOUNTER — Other Ambulatory Visit: Payer: Self-pay | Admitting: Family

## 2020-08-08 ENCOUNTER — Encounter: Payer: Self-pay | Admitting: Family

## 2020-08-08 DIAGNOSIS — I739 Peripheral vascular disease, unspecified: Secondary | ICD-10-CM

## 2020-08-13 ENCOUNTER — Other Ambulatory Visit: Payer: Self-pay | Admitting: Cardiovascular Disease

## 2020-08-13 NOTE — Telephone Encounter (Signed)
Refill request

## 2020-08-21 ENCOUNTER — Ambulatory Visit (INDEPENDENT_AMBULATORY_CARE_PROVIDER_SITE_OTHER): Payer: Medicare Other

## 2020-08-21 ENCOUNTER — Other Ambulatory Visit: Payer: Self-pay

## 2020-08-21 DIAGNOSIS — R0600 Dyspnea, unspecified: Secondary | ICD-10-CM

## 2020-08-21 DIAGNOSIS — I739 Peripheral vascular disease, unspecified: Secondary | ICD-10-CM

## 2020-08-21 DIAGNOSIS — R5383 Other fatigue: Secondary | ICD-10-CM

## 2020-08-22 ENCOUNTER — Encounter: Payer: Self-pay | Admitting: Family

## 2020-08-22 LAB — ECHOCARDIOGRAM COMPLETE
AR max vel: 2.02 cm2
AV Area VTI: 1.82 cm2
AV Area mean vel: 1.72 cm2
AV Mean grad: 4 mmHg
AV Peak grad: 7 mmHg
Ao pk vel: 1.32 m/s
Calc EF: 48.8 %
S' Lateral: 3.2 cm
Single Plane A2C EF: 51.7 %
Single Plane A4C EF: 45.2 %

## 2020-08-26 ENCOUNTER — Encounter: Payer: Self-pay | Admitting: Family

## 2020-09-03 ENCOUNTER — Ambulatory Visit: Payer: Medicare Other | Admitting: Family

## 2020-09-23 ENCOUNTER — Encounter: Payer: Self-pay | Admitting: Family

## 2020-10-08 ENCOUNTER — Encounter: Payer: Self-pay | Admitting: Family

## 2020-11-11 ENCOUNTER — Encounter: Payer: Self-pay | Admitting: Family

## 2020-11-12 ENCOUNTER — Ambulatory Visit: Payer: Medicare Other | Admitting: Family

## 2020-12-17 ENCOUNTER — Telehealth: Payer: Self-pay | Admitting: Cardiovascular Disease

## 2020-12-17 NOTE — Telephone Encounter (Signed)
Per nurse [facility] ok to see as long as having no symptoms.   Patient daughter notified .  She will come to visit as long as no changes .

## 2020-12-17 NOTE — Telephone Encounter (Signed)
    COVID-19 Pre-Screening Questions:  . In the past 7 to 10 days have you had a cough,  shortness of breath, headache, congestion, fever (100 or greater) body aches, chills, sore throat, or sudden loss of taste or sense of smell? NO . Have you been around anyone with known Covid 19. YES limited  exposure at church with PPE 12/14/20  . Have you been around anyone who is awaiting Covid 19 test results in the past 7 to 10 days? NO  . Have you been around anyone who has been exposed to Covid 19, or has mentioned symptoms of Covid 19 within the past 7 to 10 days? YES limited exposure with PPE at church   If you have any concerns/questions about symptoms patients report during screening (either on the phone or at threshold). Contact the provider seeing the patient or DOD for further guidance.  If neither are available contact a member of the leadership team.    Please advise if patient can keep 1/11 appt with Dr. Mariah Milling

## 2020-12-22 NOTE — Progress Notes (Signed)
Date:  12/23/2020   ID:  Barbara Mooney, DOB [birth year], MRN [id number]  Patient Location:  [address] [place] [address]   Provider location:   [facility], [place] office  PCP:  Patrice Paradise, MD  Cardiologist:  [facility]bard Robinson [facility]   Chief Complaint  Patient presents with  . Follow-up    3 month F/U-Patient reports getting elevated blood pressure readings over the last month.    History of Present Illness:    Barbara Mooney is a 87 y.o. 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 Who presents for routine follow-up of her persistent atrial fibrillation, bradycardia  Husband passed 04/2020 Does her ADL,  Active but no regular exercise program Legs weak but no falls  Lab work reviewed HBA1c 6 to 7 Lasix sparingly CBC good, BMP good, numbers reviewed with her  Blood pressure reasonable at home Denies orthostasis  Prior history of vertigo, none recently  Other past medical history reviewed Broke arm left in oct 2019  asymptomatic from her atrial fibrillation  Metoprolol and Cardizem added for rate control  Currently taking Eliquis 5mg  bid.    This patients CHA2DS2-VASc Score and unadjusted Ischemic Stroke Rate (% per year) is equal to 7.2 % stroke rate/year from a score of 5  Above score calculated as 1 point each if present [CHF, HTN, DM, Vascular=MI/PAD/Aortic Plaque, Age if 65-74, or Female] Above score calculated as 2 points each if present [Age > 75, or Stroke/TIA/TE]  Cardioversion on 01/20/2017, back to NSR   Prior CV studies:   The following studies were reviewed today:    Past Medical History:  Diagnosis Date  . Arthritis   . Bruises easily   .  Diabetes mellitus   . Dysrhythmia    AFIB  . Edema    FEET/ LEGS  . GERD (gastroesophageal reflux disease)   . Hypertension   . Hypothyroidism   . Lymphoma ([facility]) 12/2017   RADIATION RIGHT LEG LAST 1 WEEK AGO  . MRSA infection    4 TO 5 YEARS AGO ON BACK  . Multiple thyroid nodules   . Personal history of radiation therapy 12/2017   lymphoma   Past Surgical History:  Procedure Laterality Date  . BONE BIOPSY     2018  . CARDIOVERSION N/A 01/20/2017   Procedure: CARDIOVERSION;  Surgeon: Antonieta Iba, MD;  Location: [facility] ORS;  Service: Cardiovascular;  Laterality: N/A;  . CARDIOVERSION     AFIB 2018  . CATARACT EXTRACTION W/PHACO Right 02/15/2018   Procedure: CATARACT EXTRACTION PHACO AND INTRAOCULAR LENS PLACEMENT (IOC);  Surgeon: Galen Manila, MD;  Location: [facility] ORS;  Service: Ophthalmology;  Laterality: Right;  [place] 01:22.8 AP% 18.9 CDE 15.62 Fluid Pack Lot # [id number]  . CATARACT EXTRACTION W/PHACO Left 03/08/2018   Procedure: CATARACT EXTRACTION PHACO AND INTRAOCULAR LENS PLACEMENT (IOC);  Surgeon: Galen Manila, MD;  Location: [facility] ORS;  Service: Ophthalmology;  Laterality: Left;  [place] 01:05 AP% 15.8 CDE 10.28 Fluid pak lot # [id number]     Current Meds  Medication Sig  . acetaminophen (TYLENOL) 500 MG tablet Take 500 mg by mouth at bedtime.  Marland Kitchen CRANBERRY PO Take 1 capsule by  mouth 2 (two) times daily.   . cyanocobalamin (,VITAMIN B-12,) 1000 MCG/ML injection Inject 1,000 mcg into the muscle every 30 (thirty) days.   Marland Kitchen diltiazem (CARDIZEM CD) 180 MG 24 hr capsule TAKE 1 CAPSULE BY MOUTH TWICE A DAY  . ELIQUIS 5 MG TABS tablet TAKE 1 TABLET BY MOUTH TWICE A DAY  . furosemide (LASIX) 20 MG tablet TAKE 1 TABLET (20 MG TOTAL) BY MOUTH DAILY AS NEEDED FOR FLUID OR EDEMA (SOB).  Marland Kitchen losartan (COZAAR) 25 MG tablet Take 1 tablet (25 mg total) by mouth daily.  . metFORMIN (GLUCOPHAGE) 500 MG tablet Take 500 mg by mouth 2 (two) times daily with a meal.  . omeprazole (PRILOSEC) 20  MG capsule Take 1 capsule (20 mg total) by mouth daily.     Allergies:   Nitrofurantoin and Other   Social History   Tobacco Use  . Smoking status: Never Smoker  . Smokeless tobacco: Never Used  Substance Use Topics  . Alcohol use: No  . Drug use: No      Family Hx: The patient's family history includes Arrhythmia in her sister; Breast cancer (age of onset: 67) in her sister; Heart disease in her brother; Ovarian cancer in her maternal aunt; Stroke in her sister. There is no history of Kidney cancer or Bladder Cancer.  ROS:   Please see the history of present illness.    Review of Systems  Constitutional: Negative.   HENT: Negative.   Respiratory: Negative.   Cardiovascular: Negative.   Gastrointestinal: Negative.   Musculoskeletal: Negative.        Leg weakness  Neurological: Negative.   Psychiatric/Behavioral: Negative.   All other systems reviewed and are negative.    Labs/Other Tests and Data Reviewed:    Recent Labs: 07/23/2020: TSH 1.070 07/25/2020: BUN 28; Creatinine, Ser 1.27; Potassium 4.8; Sodium 128   Recent Lipid Panel No results found for: CHOL, TRIG, HDL, CHOLHDL, LDLCALC, LDLDIRECT  Wt Readings from Last 3 Encounters:  12/23/20 176 lb (79.8 kg)  07/23/20 173 lb 2 oz (78.5 kg)  01/28/20 176 lb 14.4 oz (80.2 kg)     Exam:    Vital Signs: BP 140/76 (BP Location: Left Arm, Patient Position: Sitting, Cuff Size: Large)   Pulse 62   Ht 5' 5.5" (1.664 m)   Wt 176 lb (79.8 kg)   SpO2 98%   BMI 28.84 kg/m   Constitutional:  oriented to person, place, and time. No distress.  HENT:  Head: Grossly normal Eyes:  no discharge. No scleral icterus.  Neck: No JVD, no carotid bruits  Cardiovascular: Regular rate and rhythm, no murmurs appreciated Pulmonary/Chest: Clear to auscultation bilaterally, no wheezes or rails Abdominal: Soft.  no distension.  no tenderness.  Musculoskeletal: Normal range of motion Neurological:  normal muscle tone. Coordination  normal. No atrophy Skin: Skin warm and dry Psychiatric: normal affect, pleasant   ASSESSMENT & PLAN:     Paroxysmal atrial fibrillation (HCC) - Continue diltiazem, rate well controlled Could cut back on dosing if needed for lower extremity edema  Essential hypertension Blood pressure well controlled, no changes made Potentially could decrease diltiazem if leg swelling gets worse, she has very sensitive/tender legs Would need to go up on losartan potentially  Unsteady gait Previously completed PT Legs are weak after she broke her arm October 2019 Doing better, independent Recommend regular walking program  Hyperlipidemia Previously not inclined to be on a cholesterol medication   Total encounter time more than 25 minutes  Greater than 50% was spent in counseling and coordination of care with the patient   Signed, Julien Nordmann, MD  12/23/2020 1:53 PM    [facility] [address], [place], [place] [address]

## 2020-12-23 ENCOUNTER — Other Ambulatory Visit: Payer: Self-pay

## 2020-12-23 ENCOUNTER — Ambulatory Visit (INDEPENDENT_AMBULATORY_CARE_PROVIDER_SITE_OTHER): Payer: Medicare Other | Admitting: Cardiovascular Disease

## 2020-12-23 VITALS — BP 140/76 | HR 62 | Ht 65.5 in | Wt 176.0 lb

## 2020-12-23 DIAGNOSIS — I10 Essential (primary) hypertension: Secondary | ICD-10-CM

## 2020-12-23 DIAGNOSIS — I4821 Permanent atrial fibrillation: Secondary | ICD-10-CM | POA: Diagnosis not present

## 2020-12-23 DIAGNOSIS — R0600 Dyspnea, unspecified: Secondary | ICD-10-CM

## 2020-12-23 DIAGNOSIS — R5383 Other fatigue: Secondary | ICD-10-CM

## 2020-12-23 DIAGNOSIS — I739 Peripheral vascular disease, unspecified: Secondary | ICD-10-CM | POA: Diagnosis not present

## 2020-12-23 NOTE — Patient Instructions (Signed)
Medication Instructions:  No changes  If you need a refill on your cardiac medications before your next appointment, please call your pharmacy.    Lab work: No new labs needed   If you have labs (blood work) drawn today and your tests are completely normal, you will receive your results only by: Marland Kitchen MyChart Message (if you have MyChart) OR . A paper copy in the mail If you have any lab test that is abnormal or we need to change your treatment, we will call you to review the results.   Testing/Procedures: No new testing needed   Follow-Up: At [facility], you and your health needs are our priority.  As part of our continuing mission to provide you with exceptional heart care, we have created designated Provider Care Teams.  These Care Teams include your primary Cardiologist (physician) and Advanced Practice Providers (APPs -  Physician Assistants and Nurse Practitioners) who all work together to provide you with the care you need, when you need it.  . You will need a follow up appointment in 6 months, APP ok  . Providers on your designated Care Team:   . Murray Hodgkins, NP . Christell Faith, PA-C . Marrianne Mood, PA-C  Any Other Special Instructions Will Be Listed Below (If Applicable).  COVID-19 Vaccine Information can be found at: ShippingScam.co.uk For questions related to vaccine distribution or appointments, please email [email or web address] or call [phone].

## 2021-01-11 ENCOUNTER — Other Ambulatory Visit: Payer: Self-pay | Admitting: Cardiovascular Disease

## 2021-01-11 NOTE — Telephone Encounter (Signed)
Would hold the losartan (unable to up in dose secondary to national shortage) Would start the olmesartan 40 mg pill , script for 90 days, 1 refill (Start the first 2-3 weeks with 1/2 pill daily and monitor pressure) Suspect will need full pill (40 mg) for pressure, but will start slowly Thx TG

## 2021-01-12 ENCOUNTER — Other Ambulatory Visit: Payer: Self-pay

## 2021-01-12 MED ORDER — OLMESARTAN MEDOXOMIL 40 MG PO TABS: 40.0000 mg | ORAL_TABLET | Freq: Every day | ORAL | 1 refills | Status: DC

## 2021-01-12 NOTE — Telephone Encounter (Signed)
Contacted [facility] lmom for them to cancel the escribe Rx for Olmesartan. Rx sent to [facility] as requested.

## 2021-01-12 NOTE — Addendum Note (Signed)
Addended by: Lamar Laundry on: 01/12/2021 09:25 AM   Modules accepted: Orders

## 2021-01-13 ENCOUNTER — Ambulatory Visit: Payer: Medicare Other | Admitting: Cardiovascular Disease

## 2021-01-13 ENCOUNTER — Encounter: Payer: Self-pay | Admitting: Family

## 2021-01-14 ENCOUNTER — Other Ambulatory Visit: Payer: Medicare Other

## 2021-01-14 DIAGNOSIS — Z20822 Contact with and (suspected) exposure to covid-19: Secondary | ICD-10-CM

## 2021-01-16 LAB — SARS-COV-2, NAA 2 DAY TAT

## 2021-01-16 LAB — NOVEL CORONAVIRUS, NAA: SARS-CoV-2, NAA: NOT DETECTED

## 2021-01-23 ENCOUNTER — Other Ambulatory Visit: Payer: Self-pay | Admitting: Physician Assistant

## 2021-01-23 DIAGNOSIS — Z1231 Encounter for screening mammogram for malignant neoplasm of breast: Secondary | ICD-10-CM

## 2021-01-29 ENCOUNTER — Other Ambulatory Visit: Payer: Self-pay | Admitting: Physician Assistant

## 2021-01-29 DIAGNOSIS — N644 Mastodynia: Secondary | ICD-10-CM

## 2021-02-17 ENCOUNTER — Ambulatory Visit
Admission: RE | Admit: 2021-02-17 | Discharge: 2021-02-17 | Disposition: A | Discharge status: Home / Self Care | Payer: Medicare Other | Source: Ambulatory Visit | Attending: Physician Assistant | Admitting: Physician Assistant

## 2021-02-17 ENCOUNTER — Other Ambulatory Visit: Payer: Self-pay

## 2021-02-17 DIAGNOSIS — N644 Mastodynia: Secondary | ICD-10-CM | POA: Diagnosis present

## 2021-02-19 ENCOUNTER — Encounter: Payer: Self-pay | Admitting: Family

## 2021-02-19 ENCOUNTER — Other Ambulatory Visit: Payer: Self-pay | Admitting: Physician Assistant

## 2021-02-19 DIAGNOSIS — R921 Mammographic calcification found on diagnostic imaging of breast: Secondary | ICD-10-CM

## 2021-02-19 DIAGNOSIS — R928 Other abnormal and inconclusive findings on diagnostic imaging of breast: Secondary | ICD-10-CM

## 2021-02-26 ENCOUNTER — Ambulatory Visit
Admission: RE | Admit: 2021-02-26 | Discharge: 2021-02-26 | Disposition: A | Discharge status: Home / Self Care | Payer: Medicare Other | Source: Ambulatory Visit | Attending: Physician Assistant | Admitting: Physician Assistant

## 2021-02-26 ENCOUNTER — Other Ambulatory Visit: Payer: Self-pay

## 2021-02-26 DIAGNOSIS — R928 Other abnormal and inconclusive findings on diagnostic imaging of breast: Secondary | ICD-10-CM

## 2021-02-26 DIAGNOSIS — R921 Mammographic calcification found on diagnostic imaging of breast: Secondary | ICD-10-CM | POA: Insufficient documentation

## 2021-02-26 HISTORY — PX: BREAST BIOPSY: SHX20

## 2021-02-27 ENCOUNTER — Encounter: Payer: Self-pay | Admitting: *Deleted

## 2021-02-27 LAB — SURGICAL PATHOLOGY

## 2021-02-27 NOTE — Progress Notes (Signed)
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  Called patient and her Daughter [name].  Patient would like to see Dr. Bary Castilla for a surgical consult.  Informed patient I will schedule all appointments on Monday, since his office is closed for the afternoon.  She is agreeable.  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  States she had surgery at [facility] and radiation therapy at [facility].

## 2021-03-02 ENCOUNTER — Encounter: Payer: Self-pay | Admitting: *Deleted

## 2021-03-02 ENCOUNTER — Other Ambulatory Visit: Payer: Self-pay | Admitting: *Deleted

## 2021-03-02 DIAGNOSIS — D0511 Intraductal carcinoma in situ of right breast: Secondary | ICD-10-CM

## 2021-03-02 NOTE — Progress Notes (Signed)
Called patient's daughter [name] and confirmed appointment with Dr. Bary Castilla on Wednesday at 11:00 and Dr. Jacinto Reap on Thursday, 03/05/21 @ 11:15.  Will give educational material at that time.

## 2021-03-04 ENCOUNTER — Other Ambulatory Visit: Payer: Self-pay | Admitting: General Surgery

## 2021-03-04 ENCOUNTER — Telehealth: Payer: Self-pay | Admitting: Cardiovascular Disease

## 2021-03-04 NOTE — Telephone Encounter (Signed)
Surgeons office has faxed formal clearance which has been entered and sent to the preop pool

## 2021-03-04 NOTE — Telephone Encounter (Signed)
   Primary Cardiologist: Ida Rogue, MD  Chart reviewed as part of pre-operative protocol coverage. Given past medical history and time since last visit, based on ACC/AHA guidelines, [place] would be at acceptable risk for the planned procedure without further cardiovascular testing.   Patient with diagnosis of afib on Eliquis for anticoagulation.    Procedure: R breast lumpectomy Date of procedure: 03/09/21  CHA2DS2-VASc Score = 5  This indicates a 7.2% annual risk of stroke. The patient's score is based upon: CHF History: No HTN History: Yes Diabetes History: Yes Stroke History: No Vascular Disease History: No Age Score: 2 Gender Score: 1  CrCl 44 mL/min using adjusted body weight Platelet count 271K  Per office protocol, patient can hold Eliquis for 2 days prior to procedure.    Patient should restart Eliquis on the evening of procedure or day after, at discretion of procedure MD.   Patient was advised that if she develops new symptoms prior to surgery to contact our office to arrange a follow-up appointment.  She verbalized understanding.  I will route this recommendation to the requesting party via Epic fax function and remove from pre-op pool.  Please call with questions.  Jossie Ng. Cleaver NP-C    03/04/2021, 12:05 PM [address] [place] [address] Office [phone] Fax [phone]

## 2021-03-04 NOTE — Telephone Encounter (Signed)
Patient with diagnosis of afib on Eliquis for anticoagulation.    Procedure: R breast lumpectomy Date of procedure: 03/09/21  CHA2DS2-VASc Score = 5  This indicates a 7.2% annual risk of stroke. The patient's score is based upon: CHF History: No HTN History: Yes Diabetes History: Yes Stroke History: No Vascular Disease History: No Age Score: 2 Gender Score: 1  CrCl 44 mL/min using adjusted body weight Platelet count 271K  Per office protocol, patient can hold Eliquis for 2 days prior to procedure.    Patient should restart Eliquis on the evening of procedure or day after, at discretion of procedure MD.

## 2021-03-04 NOTE — Telephone Encounter (Signed)
   [facility] Pre-operative Risk Assessment    HEARTCARE STAFF: - Please ensure there is not already an duplicate clearance open for this procedure. - Under Visit Info/Reason for Call, type in Other and utilize the format Clearance MM/DD/YY or Clearance TBD. Do not use dashes or single digits. - If request is for dental extraction, please clarify the # of teeth to be extracted.  Request for surgical clearance:  1. What type of surgery is being performed? r breast lumpectomy   2. When is this surgery scheduled? 03/09/21  3. What type of clearance is required (medical clearance vs. Pharmacy clearance to hold med vs. Both)? pharm  4. Are there any medications that need to be held prior to surgery and how long?advise on eliquis  5. Practice name and name of physician performing surgery? [facility] Dr. Bary Castilla  6. What is the office phone number? [phone]   7.   What is the office fax number? [phone]  8.   Anesthesia type (None, local, MAC, general) ? Not noted   Marykay Lex 03/04/2021, 10:43 AM  _________________________________________________________________   (provider comments below)

## 2021-03-04 NOTE — Telephone Encounter (Signed)
S/w Selinda Eon @ Dr Dwyane Luo office she states that If Dr Bary Castilla told the pt to stop Eliquis he does not need cardiac clearance. Procedure is already scheduled and he did not ask for cardiac clearance....Marland KitchenMarland KitchenPt to hold, per Dr Bary Castilla, Eliquis- 2 days prior and possibly 5 days following surgery. She will call back/fax clearance if needed. Dr Bary Castilla is in surgery this morning and Selinda Eon will ask him this afternoon

## 2021-03-04 NOTE — Progress Notes (Signed)
Subjective:     Patient ID: Barbara Mooney is a 87 y.o. female.  HPI  The following portions of the patient's history were reviewed and updated as appropriate.  This a new patient is here today for: office visit. Here for breast evaluation, new diagnosis right breast cancer,referred by Paulita Cradle PA. She had a breast biopsy on 02-26-21. She states during a breast exam by Alvira Philips PA she had a "tender" spot which triggered her mammogram to be done sooner.  She is here with her daughter, [name].  The patient's family history of cancer is moderate.  She had a sister who developed breast cancer in her early 32s and the daughter of that sister had breast cancer in her 40s.  She had a maternal aunt with ovarian cancer, unclear age.  A maternal aunt had colon cancer.  Her brother had lung cancer but was a heavy smoker.  No first-degree relatives with ovarian or colon cancer.   Review of Systems  Constitutional: Negative for chills and fever.  Respiratory: Negative for cough.        Chief Complaint  Patient presents with  . New Patient     BP 132/60   Pulse 90   Temp 36.7 C (98.1 F)   Ht 166.4 cm (5' 5.5")   Wt 81.6 kg (180 lb)   SpO2 97%   BMI 29.50 kg/m       Past Medical History:  Diagnosis Date  . Arrhythmia 10/2016   aFib  . B12 deficiency   . Cataract cortical, senile    Bilateral  . Diabetes mellitus (CMS-HCC)   . Diverticulosis   . Dysrhythmia   . Edema   . Essential hypertension, benign   . External hemorrhoid    large  . GERD (gastroesophageal reflux disease)   . History of chicken pox   . Hypothyroidism   . Lymphoma (CMS-HCC) 12/2017  . 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 Multiple thyroid nodules   . Osteoporosis   . Other and unspecified hyperlipidemia   . Personal history of radiation therapy   . Primary osteoarthritis of left knee   . Primary osteoarthritis of right knee   . Type 2  diabetes mellitus with stage 3 chronic kidney disease, without long-term current use of insulin (CMS-HCC)           Past Surgical History:  Procedure Laterality Date  . BIOPSY EXCAL BONE LEG Right 10/31/2017   Procedure: BIOPSY, BONE, OPEN; DEEP LEG, right tibia;  Surgeon: Visgauss, Dewaine Oats, MD;  Location: [facility];  Service: Orthopedics;  Laterality: Right;  . BREAST EXCISIONAL BIOPSY Right 02/26/2021  . CARDIOVERSION INT  01/20/2017  . CATARACT EXTRACTION Left 03/08/2018  . CATARACT EXTRACTION Right 02/15/2018  . COLONOSCOPY  05/12/2004   RPT 10 YRS ([facility])  . COLONOSCOPY  05/16/2014 [facility]   EXTERNAL AND INTERNAL HEMORRHOIDS/PROLAPSE(GRADE IV) FOUND ON PERIANAL EXAM/NO REPEAT/OH  . MASTECTOMY PARTIAL / LUMPECTOMY Right    In-situ cancer  . OPEN RELEASE OF TRIGGER RIGHT MIDDLE FINGER  03/18/2008  . SKIN CANCER REMOVED FROM [facility]  11/2009      OB History    Gravida  2   Para  2   Term      Preterm      AB      Living        SAB      IAB      Ectopic      Molar  Multiple      Live Births          Obstetric Comments  Age at first period 38 Age of first pregnancy 44         Social History          Socioeconomic History  . Marital status: Widowed    Spouse name: Not on file  . Number of children: 2  . Years of education: 17  . Highest education level: Not on file  Occupational History  . Not on file  Tobacco Use  . Smoking status: Never Smoker  . Smokeless tobacco: Never Used  Vaping Use  . Vaping Use: Never used  Substance and Sexual Activity  . Alcohol use: Never    Alcohol/week: 0.0 standard drinks  . Drug use: No  . Sexual activity: Not Currently    Partners: Male  Other Topics Concern  . Not on file  Social History Narrative  . Not on file   Social Determinants of Health   Financial Resource Strain: Not on file  Food Insecurity: Not on file  Transportation Needs: Not on file            Allergies  Allergen Reactions  . Macrobid [Nitrofurantoin Monohyd/M-Cryst] Diarrhea, Nausea and Vomiting    Current Medications        Current Outpatient Medications  Medication Sig Dispense Refill  . acetaminophen (TYLENOL) 325 MG tablet Take 650 mg by mouth every 4 (four) hours as needed for Pain    . apixaban (ELIQUIS) 5 mg tablet Take 5 mg by mouth 2 (two) times daily    . cranberry fruit extract (CRANBERRY EXTRACT, BULK,) 12:1 Powd Take by mouth 2 (two) times daily       . diltiazem (CARDIZEM CD) 180 MG CD capsule TAKE 1 CAPSULE (180 MG TOTAL) BY MOUTH ONCE DAILY 90 capsule 0  . FUROsemide (LASIX) 20 MG tablet Take 1 tablet (20 mg total) by mouth once daily as needed for Edema. 90 tablet 1  . metFORMIN (GLUCOPHAGE) 500 MG tablet TAKE 1 TABLET BY MOUTH TWICE A DAY 180 tablet 1  . olmesartan (BENICAR) 40 MG tablet Take 40 mg by mouth once daily    . omeprazole (PRILOSEC) 20 MG DR capsule TAKE 1 CAPSULE BY MOUTH EVERY DAY 90 capsule 1  . polyethylene glycol (MIRALAX) powder Take 255 g by mouth once daily. Patient [place] 1 capful in water daily.  5  . gabapentin (NEURONTIN) 100 MG capsule Take 1 capsule (100 mg total) by mouth 2 (two) times daily (Patient not taking: Reported on 03/03/2021  ) 60 capsule 1            Current Facility-Administered Medications  Medication Dose Route Frequency Provider Last Rate Last Admin  . cyanocobalamin (VITAMIN B12) injection 1,000 mcg  1,000 mcg Intramuscular Q30 Days Paulita Cradle [place], [place]   1,000 mcg at 02/11/21 1014           Family History  Problem Relation Age of Onset  . High blood pressure (Hypertension) Mother   . Diabetes type II Mother   . Osteoporosis (Thinning of bones) Mother   . High blood pressure (Hypertension) Father   . High blood pressure (Hypertension) Sister   . Diabetes type II Sister   . Breast cancer Sister   . Breast cancer Sister   . Diabetes type II Sister   . High blood pressure  (Hypertension) Brother   . Lung cancer Brother   . High blood  pressure (Hypertension) Brother   . Lymphoma Brother   . High blood pressure (Hypertension) Brother   . Ovarian cancer Maternal Aunt   . Anesthesia problems Neg Hx   . Colon cancer Neg Hx         Objective:   Physical Exam Exam conducted with a chaperone present.  Constitutional:      Appearance: Normal appearance.  Cardiovascular:     Rate and Rhythm: Normal rate. Rhythm irregularly irregular.     Pulses: Normal pulses.     Heart sounds: Normal heart sounds.  Pulmonary:     Effort: Pulmonary effort is normal.     Breath sounds: Normal breath sounds.  Chest:  Breasts:     Right: No axillary adenopathy or supraclavicular adenopathy.     Left: No axillary adenopathy or supraclavicular adenopathy.        Comments: Hematoma at the 330 o'clock position post biopsy.  Bruising extends to the lower outer quadrant. Musculoskeletal:     Cervical back: Neck supple.  Lymphadenopathy:     Upper Body:     Right upper body: No supraclavicular or axillary adenopathy.     Left upper body: No supraclavicular or axillary adenopathy.  Skin:    General: Skin is warm and dry.  Neurological:     Mental Status: She is alert and oriented to person, place, and time.  Psychiatric:        Mood and Affect: Mood normal.        Behavior: Behavior normal.     Labs and Radiology:   ECG dated July 23, 2020:  Atrial fibrillation with right bundle branch block.  Mammography review:  July 05, 2019 through February 26, 2021 imaging studies independently reviewed.  Developing area of microcalcifications in the lower inner quadrant of the right breast.  11 mm diameter.  Ultrasound of the area completed prior to biopsy was unremarkable.  Pathology dated February 26, 2021:  DIAGNOSIS:  A. BREAST WITH CALCIFICATIONS, RIGHT LOWER INNER; STEREOTACTIC BIOPSY:  - HIGH-GRADE DUCTAL CARCINOMA IN SITU (DCIS), COMEDO  TYPE.  - LOBULAR NEOPLASIA.  - CALCIFICATIONS ASSOCIATED WITH DCIS AND COLUMNAR CELL CHANGE.   Comment:  DCIS is present in 7 of 8 blocks with the largest linear focus measuring  7 mm. IHC for estrogen receptor (ER) is deferred to an excision  specimen.  Ultrasound March 04, 2021:  Limited ultrasound examination of the right breast in the area of hematoma was completed to help evaluate the need for preoperative wire localization.  There is a hematoma cavity extending almost 4 cm in length and up to 1.96 cm in width.  The biopsy clip appears to be within this lesion.  BI-RADS-6.   Laboratory studies dated January 07, 2021:  Component Ref Range & Units 1 mo ago   Hemoglobin A1C 4.2 - 5.6 % 6.8High   Average Blood Glucose (Calc) mg/dL 148    Component Ref Range & Units 1 mo ago   Glucose 70 - 110 mg/dL 129High   Sodium 136 - 145 mmol/L 143   Potassium 3.6 - 5.1 mmol/L 4.4   Chloride 97 - 109 mmol/L 103   Carbon Dioxide (CO2) 22.0 - 32.0 mmol/L 34.4High   Urea Nitrogen (BUN) 7 - 25 mg/dL 12   Creatinine 0.6 - 1.1 mg/dL 1.0   Glomerular Filtration Rate (eGFR), MDRD Estimate >60 mL/min/1.73sq m 53Low   Calcium 8.7 - 10.3 mg/dL 10.0   AST  8 - 39 U/L 17   ALT  5 - 38 U/L 10   Alk Phos (alkaline Phosphatase) 34 - 104 U/L 107High   Albumin 3.5 - 4.8 g/dL 4.5   Bilirubin, Total 0.3 - 1.2 mg/dL 0.5   Protein, Total 6.1 - 7.9 g/dL 7.2        Assessment:     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  Hematoma post biopsy secondary to Eliquis.  Chronic atrial fibrillation with previously failed cardioversion on chronic Eliquis.    Plan:     More than 50% of the one-hour visit was spent reviewing options for management of in situ breast cancer.  Complications related to manipulation of the Eliquis dosing needed to minimize worse hematoma formation with surgery was reviewed and consultation has been requested with her treating cardiologist, Ida Rogue, MD.  Anticipate holding her Eliquis for 2 days prior to the procedure and up to 3-4 days post procedure to minimize hematoma formation.  Breast conservation with radiation was presented is equivalent to mastectomy.  Considering her need for ongoing anticoagulation there would be less risk of bleeding and hematoma post procedure with breast conservation which is her preference.  She will be meeting with medical oncology later this week.  The patient's mother lived to her mid 85s, and the patient remains very active at home including tending to property and riding her tractor.  While the data on radiation in women in their 49s has come into question, with her anticipated life expectancy being 10 years plus or minus, this will likely be appropriate therapy.  Plans at present are for surgery on March 09, 2021.     Entered by Karie Fetch, RN, acting as a scribe for Dr. Hervey Ard, MD.  The documentation recorded by the scribe accurately reflects the service I personally performed and the decisions made by me.   Robert Bellow, MD FACS   In light of her hematoma and elevated HGB A1c, the patient will receive Ancef prior to surgery.

## 2021-03-05 ENCOUNTER — Other Ambulatory Visit: Payer: Self-pay | Admitting: General Surgery

## 2021-03-05 ENCOUNTER — Other Ambulatory Visit
Admission: RE | Admit: 2021-03-05 | Discharge: 2021-03-05 | Disposition: A | Discharge status: Home / Self Care | Payer: Medicare Other | Source: Ambulatory Visit | Attending: General Surgery | Admitting: General Surgery

## 2021-03-05 ENCOUNTER — Inpatient Hospital Stay: Payer: Medicare Other | Attending: Internal Medicine | Admitting: Internal Medicine

## 2021-03-05 ENCOUNTER — Encounter: Payer: Self-pay | Admitting: Internal Medicine

## 2021-03-05 ENCOUNTER — Encounter: Payer: Self-pay | Admitting: *Deleted

## 2021-03-05 ENCOUNTER — Inpatient Hospital Stay: Payer: Medicare Other

## 2021-03-05 ENCOUNTER — Other Ambulatory Visit: Payer: Self-pay

## 2021-03-05 DIAGNOSIS — Z7901 Long term (current) use of anticoagulants: Secondary | ICD-10-CM | POA: Insufficient documentation

## 2021-03-05 DIAGNOSIS — Z01812 Encounter for preprocedural laboratory examination: Secondary | ICD-10-CM | POA: Insufficient documentation

## 2021-03-05 DIAGNOSIS — Z20822 Contact with and (suspected) exposure to covid-19: Secondary | ICD-10-CM | POA: Insufficient documentation

## 2021-03-05 DIAGNOSIS — D0511 Intraductal carcinoma in situ of right breast: Secondary | ICD-10-CM

## 2021-03-05 DIAGNOSIS — Z923 Personal history of irradiation: Secondary | ICD-10-CM | POA: Diagnosis not present

## 2021-03-05 DIAGNOSIS — Z803 Family history of malignant neoplasm of breast: Secondary | ICD-10-CM | POA: Diagnosis not present

## 2021-03-05 DIAGNOSIS — Z8041 Family history of malignant neoplasm of ovary: Secondary | ICD-10-CM

## 2021-03-05 DIAGNOSIS — I4891 Unspecified atrial fibrillation: Secondary | ICD-10-CM | POA: Insufficient documentation

## 2021-03-05 DIAGNOSIS — Z8572 Personal history of non-Hodgkin lymphomas: Secondary | ICD-10-CM | POA: Diagnosis not present

## 2021-03-05 LAB — SARS CORONAVIRUS 2 (TAT 6-24 HRS): SARS Coronavirus 2: NEGATIVE

## 2021-03-05 NOTE — Progress Notes (Signed)
Met patient and her daughter Barbara Mooney, during her medical oncology consult with Dr. Rogue Bussing.  Patient is scheduled for surgery on Monday.  Dr. B discussed genetic testing with patient.  She will be scheduled to see Denton Ar, the genetic counselor.  Gave patient breast cancer educational literature, "My Breast Cancer Treatment Handbook" by Josephine Igo, RN.  They were both encouraged to call with any questions or needs.

## 2021-03-05 NOTE — Progress Notes (Signed)
[facility] NOTE  Patient Care Team: Marinda Elk, MD as PCP - General (Physician Assistant) Minna Merritts, MD as PCP - Cardiology (Cardiology) Minna Merritts, MD as Consulting Physician (Cardiology) Rico Junker, RN as Oncology Nurse Navigator  CHIEF COMPLAINTS/PURPOSE OF CONSULTATION: Breast cancer  #  Oncology History Overview Note  A. BREAST WITH CALCIFICATIONS, RIGHT LOWER INNER; STEREOTACTIC BIOPSY:  - HIGH-GRADE DUCTAL CARCINOMA IN SITU (DCIS), COMEDO TYPE.  - LOBULAR NEOPLASIA.  - CALCIFICATIONS ASSOCIATED WITH DCIS AND COLUMNAR CELL CHANGE.  # RIGHT LE NON-HODGKIN- FOLLICULAR LYMPHOMA [surgery- at [facility] s/p RT-Dr.Crystal] 2018.    Ductal carcinoma in situ (DCIS) of right breast  03/05/2021 Initial Diagnosis   Ductal carcinoma in situ (DCIS) of right breast   03/05/2021 Cancer Staging   Staging form: Breast, AJCC 8th Edition - Clinical: Stage 0 (cTis (DCIS), cN0, cM0) - Signed by Cammie Sickle, MD on 03/05/2021 Stage prefix: Initial diagnosis      HISTORY OF PRESENTING ILLNESS:  Barbara Mooney 87 y.o.  female female with no prior history of breast cancer/or malignancies has been referred to [place] for further evaluation recommendations for new diagnosis of DCIS.  Patient on routine exam with her PCP noted to have tenderness in the right breast.  This led to diagnostic mammogram/ultrasound/followed by biopsy-as summarized above.  Patient notes a discoloration of the right breast post biopsy.  Otherwise denies any unusual new lumps or bumps.  Review of Systems  Constitutional: Negative for chills, diaphoresis, fever, malaise/fatigue and weight loss.  HENT: Negative for nosebleeds and sore throat.   Eyes: Negative for double vision.  Respiratory: Negative for cough, hemoptysis, sputum production, shortness of breath and wheezing.   Cardiovascular: Negative for chest pain, palpitations, orthopnea and leg swelling.   Gastrointestinal: Negative for abdominal pain, blood in stool, constipation, diarrhea, heartburn, melena, nausea and vomiting.  Genitourinary: Negative for dysuria, frequency and urgency.  Musculoskeletal: Positive for back pain. Negative for joint pain.  Skin: Negative.  Negative for itching and rash.  Neurological: Negative for dizziness, tingling, focal weakness, weakness and headaches.  Endo/Heme/Allergies: Does not bruise/bleed easily.  Psychiatric/Behavioral: Negative for depression. The patient is not nervous/anxious and does not have insomnia.      MEDICAL HISTORY:  Past Medical History:  Diagnosis Date  . Arthritis   . Bruises easily   . Diabetes mellitus   . Dysrhythmia    AFIB  . Edema    FEET/ LEGS  . GERD (gastroesophageal reflux disease)   . Hypertension   . Hypothyroidism   . Lymphoma ([place]) 12/2017   RADIATION RIGHT LEG LAST 1 WEEK AGO  . MRSA infection    4 TO 5 YEARS AGO ON BACK  . Multiple thyroid nodules   . Personal history of radiation therapy 12/2017   lymphoma    SURGICAL HISTORY: Past Surgical History:  Procedure Laterality Date  . BONE BIOPSY     2018  . BREAST BIOPSY Right 02/26/2021   ffirm bx-"X" clip-path pending  . CARDIOVERSION N/A 01/20/2017   Procedure: CARDIOVERSION;  Surgeon: Minna Merritts, MD;  Location: [facility] ORS;  Service: Cardiovascular;  Laterality: N/A;  . CARDIOVERSION     AFIB 2018  . CATARACT EXTRACTION W/PHACO Right 02/15/2018   Procedure: CATARACT EXTRACTION PHACO AND INTRAOCULAR LENS PLACEMENT (IOC);  Surgeon: Birder Robson, MD;  Location: [facility] ORS;  Service: Ophthalmology;  Laterality: Right;  [place] 01:22.8 AP% 18.9 CDE 15.62 Fluid Pack Lot # [id number]  . CATARACT  EXTRACTION W/PHACO Left 03/08/2018   Procedure: CATARACT EXTRACTION PHACO AND INTRAOCULAR LENS PLACEMENT (IOC);  Surgeon: Birder Robson, MD;  Location: [facility] ORS;  Service: Ophthalmology;  Laterality: Left;  [place] 01:05 AP% 15.8 CDE 10.28 Fluid pak lot #  [id number]    SOCIAL HISTORY: Social History   Socioeconomic History  . Marital status: Widowed    Spouse name: Not on file  . Number of children: Not on file  . Years of education: Not on file  . Highest education level: Not on file  Occupational History  . Not on file  Tobacco Use  . Smoking status: Never Smoker  . Smokeless tobacco: Never Used  Substance and Sexual Activity  . Alcohol use: No  . Drug use: No  . Sexual activity: Not on file  Other Topics Concern  . Not on file  Social History Narrative   Lives by self; son lives about 2 miles. Never smoked; no alcohol. Used to [employer].    Social Determinants of Health   Financial Resource Strain: Not on file  Food Insecurity: Not on file  Transportation Needs: Not on file  Physical Activity: Not on file  Stress: Not on file  Social Connections: Not on file  Intimate Partner Violence: Not on file    FAMILY HISTORY: Family History  Problem Relation Age of Onset  . Breast cancer Sister 67  . Heart disease Brother   . Ovarian cancer Maternal Aunt        another aunt- colon cancer  . Stroke Sister   . Arrhythmia Sister        A-Fib  . Kidney cancer Neg Hx   . Bladder Cancer Neg Hx     ALLERGIES:  is allergic to nitrofurantoin.  MEDICATIONS:  Current Outpatient Medications  Medication Sig Dispense Refill  . acetaminophen (TYLENOL) 500 MG tablet Take 500 mg by mouth at bedtime.    Marland Kitchen CRANBERRY PO Take 1 capsule by mouth 2 (two) times daily.     . cyanocobalamin (,VITAMIN B-12,) 1000 MCG/ML injection Inject 1,000 mcg into the muscle every 30 (thirty) days.     Marland Kitchen diltiazem (CARDIZEM CD) 180 MG 24 hr capsule TAKE 1 CAPSULE BY MOUTH TWICE A DAY (Patient taking differently: Take 180 mg by mouth 2 (two) times daily.) 180 capsule 0  . ELIQUIS 5 MG TABS tablet TAKE 1 TABLET BY MOUTH TWICE A DAY (Patient taking differently: Take 5 mg by mouth 2 (two) times daily.) 180 tablet 1  . furosemide (LASIX) 20 MG tablet TAKE 1  TABLET (20 MG TOTAL) BY MOUTH DAILY AS NEEDED FOR FLUID OR EDEMA (SOB). 90 tablet 0  . metFORMIN (GLUCOPHAGE) 500 MG tablet Take 500 mg by mouth 2 (two) times daily with a meal.    . olmesartan (BENICAR) 40 MG tablet Take 1 tablet (40 mg total) by mouth daily. 90 tablet 1  . omeprazole (PRILOSEC) 20 MG capsule Take 1 capsule (20 mg total) by mouth daily. 30 capsule 6  . Trolamine Salicylate (BLUE-EMU HEMP EX) Apply 1 application topically daily as needed (Knee pain).     No current facility-administered medications for this visit.      Marland Kitchen  PHYSICAL EXAMINATION: ECOG PERFORMANCE STATUS: 0 - Asymptomatic  Vitals:   03/05/21 1115  BP: (!) 153/103  Pulse: (!) 106  Resp: 20  Temp: (!) 96.8 F (36 C)  SpO2: 100%   Filed Weights   03/05/21 1115  Weight: 179 lb 9.6 oz (81.5 kg)  Physical Exam Constitutional:      Appearance: Normal appearance.     Comments: Walk independently.;  Accompanied by daughter.   HENT:     Head: Normocephalic and atraumatic.     Mouth/Throat:     Pharynx: No oropharyngeal exudate.  Eyes:     Pupils: Pupils are equal, round, and reactive to light.  Cardiovascular:     Rate and Rhythm: Normal rate. Rhythm irregular.  Pulmonary:     Effort: Pulmonary effort is normal. No respiratory distress.     Breath sounds: Normal breath sounds. No wheezing.  Abdominal:     General: Bowel sounds are normal. There is no distension.     Palpations: Abdomen is soft. There is no mass.     Tenderness: There is no abdominal tenderness. There is no guarding or rebound.  Musculoskeletal:        General: No tenderness. Normal range of motion.     Cervical back: Normal range of motion and neck supple.  Skin:    General: Skin is warm.  Neurological:     Mental Status: She is alert and oriented to person, place, and time.  Psychiatric:        Mood and Affect: Affect normal.      LABORATORY DATA:  I have reviewed the data as listed Lab Results  Component Value  Date   WBC 6.2 01/11/2018   HGB 12.4 01/11/2018   HCT 38.1 01/11/2018   MCV 88.5 01/11/2018   PLT 321 01/11/2018   Recent Labs    07/23/20 1418 07/25/20 0832  NA 127* 128*  K 5.8* 4.8  CL 93* 94*  CO2 20 24  GLUCOSE 113* 93  BUN 25 28*  CREATININE 1.10* 1.27*  CALCIUM 9.7 9.5  GFRNONAA 46* 39*  GFRAA 53* 45*    RADIOGRAPHIC STUDIES: I have personally reviewed the radiological images as listed and agreed with the findings in the report. US BREAST LTD UNI RIGHT INC AXILLA  Result Date: 02/17/2021 CLINICAL DATA:  Patient presents for focal tenderness within the right breast. EXAM: DIGITAL DIAGNOSTIC BILATERAL MAMMOGRAM WITH TOMOSYNTHESIS AND CAD; ULTRASOUND RIGHT BREAST LIMITED TECHNIQUE: Bilateral digital diagnostic mammography and breast tomosynthesis was performed. The images were evaluated with computer-aided detection.; Targeted ultrasound examination of the right breast was performed COMPARISON:  Previous exam(s). ACR Breast Density Category c: The breast tissue is heterogeneously dense, which may obscure small masses. FINDINGS: Within the lower inner right breast middle depth underlying the palpable marker there is a new punctate group of calcifications measuring approximately 11 mm, further evaluated with magnification cc and true lateral views. No additional concerning masses, calcifications or distortion identified within either breast. On physical exam, no discrete mass is palpated within the lower inner right breast. Targeted ultrasound is performed, showing normal dense tissue without suspicious mass at the site of focal tenderness within the right breast 5 o'clock position 5 cm from nipple. No right axillary adenopathy. IMPRESSION: New indeterminate calcifications lower inner right breast. No suspicious mass identified at the site of focal tenderness right breast. RECOMMENDATION: Stereotactic guided core needle biopsy right breast calcifications. I have discussed the findings and  recommendations with the patient. If applicable, a reminder letter will be sent to the patient regarding the next appointment. BI-RADS CATEGORY  4: Suspicious. Electronically Signed   By: Lovey Newcomer M.D.   On: 02/17/2021 12:30   MM DIAG BREAST TOMO BILATERAL  Result Date: 02/17/2021 CLINICAL DATA:  Patient presents for focal tenderness within the  right breast. EXAM: DIGITAL DIAGNOSTIC BILATERAL MAMMOGRAM WITH TOMOSYNTHESIS AND CAD; ULTRASOUND RIGHT BREAST LIMITED TECHNIQUE: Bilateral digital diagnostic mammography and breast tomosynthesis was performed. The images were evaluated with computer-aided detection.; Targeted ultrasound examination of the right breast was performed COMPARISON:  Previous exam(s). ACR Breast Density Category c: The breast tissue is heterogeneously dense, which may obscure small masses. FINDINGS: Within the lower inner right breast middle depth underlying the palpable marker there is a new punctate group of calcifications measuring approximately 11 mm, further evaluated with magnification cc and true lateral views. No additional concerning masses, calcifications or distortion identified within either breast. On physical exam, no discrete mass is palpated within the lower inner right breast. Targeted ultrasound is performed, showing normal dense tissue without suspicious mass at the site of focal tenderness within the right breast 5 o'clock position 5 cm from nipple. No right axillary adenopathy. IMPRESSION: New indeterminate calcifications lower inner right breast. No suspicious mass identified at the site of focal tenderness right breast. RECOMMENDATION: Stereotactic guided core needle biopsy right breast calcifications. I have discussed the findings and recommendations with the patient. If applicable, a reminder letter will be sent to the patient regarding the next appointment. BI-RADS CATEGORY  4: Suspicious. Electronically Signed   By: Lovey Newcomer M.D.   On: 02/17/2021 12:30   MM  CLIP PLACEMENT RIGHT  Result Date: 02/26/2021 CLINICAL DATA:  Post procedure mammogram for clip placement. EXAM: DIAGNOSTIC RIGHT MAMMOGRAM POST STEREOTACTIC BIOPSY COMPARISON:  Previous exam(s). FINDINGS: Mammographic images were obtained following stereotactic guided biopsy of calcifications in the lower inner right breast. The biopsy marking clip is in expected position at the site of biopsy. IMPRESSION: Appropriate positioning of the X shaped biopsy marking clip at the site of biopsy in the lower inner right breast. Final Assessment: Post Procedure Mammograms for Marker Placement Electronically Signed   By: Audie Pinto M.D.   On: 02/26/2021 13:57   MM RT BREAST BX W LOC DEV 1ST LESION IMAGE BX SPEC STEREO GUIDE  Addendum Date: 02/27/2021   ADDENDUM REPORT: 02/27/2021 16:00 ADDENDUM: Pathology revealed HIGH-GRADE DUCTAL CARCINOMA IN SITU (DCIS), COMEDO TYPE, LOBULAR NEOPLASIA. CALCIFICATIONS ASSOCIATED WITH DCIS AND COLUMNAR CELL CHANGE of the RIGHT breast, lower inner quadrant. This was found to be concordant by Dr. Audie Pinto. Pathology results were discussed with the patient and her daughter [name]) by telephone. The patient reported doing well after the biopsy with tenderness at the site. Post biopsy instructions and care were reviewed and questions were answered. The patient was encouraged to call [facility] of [facility] for any additional concerns. Consider Breast MRI given high grade histology. Reports and recommendation of surgical/oncology referral sent to Oncology Nurse Navigators Tanya Nones RN and Al Pimple RN of [place] message. Pathology results reported by Stacie Acres RN on 02/27/2021. Electronically Signed   By: Audie Pinto M.D.   On: 02/27/2021 16:00   Result Date: 02/27/2021 CLINICAL DATA:  87 year old female presenting for biopsy of right breast calcifications. EXAM: RIGHT BREAST  STEREOTACTIC CORE NEEDLE BIOPSY COMPARISON:  Previous exams. FINDINGS: The patient and I discussed the procedure of stereotactic-guided biopsy including benefits and alternatives. We discussed the high likelihood of a successful procedure. We discussed the risks of the procedure including infection, bleeding, tissue injury, clip migration, and inadequate sampling. Informed written consent was given. The usual time out protocol was performed immediately prior to the procedure. Using sterile technique and 1% Lidocaine as  local anesthetic, under stereotactic guidance, a 9 gauge vacuum assisted device was used to perform core needle biopsy of calcifications in the lower inner right breast using a medial approach. Specimen radiograph was performed showing at least 2 specimens with calcifications. Specimens with calcifications are identified for pathology. Lesion quadrant: Lower inner quadrant At the conclusion of the procedure, an X tissue marker clip was deployed into the biopsy cavity. Follow-up 2-view mammogram was performed and dictated separately. IMPRESSION: Stereotactic-guided biopsy of calcifications in the lower inner right breast. No apparent complications. Electronically Signed: By: Audie Pinto M.D. On: 02/26/2021 13:58    ASSESSMENT & PLAN:   Ductal carcinoma in situ (DCIS) of right breast #Right breast DCIS; with comedonecrosis.    # I had a long discussion with patient regarding pathology/natural history of DCIS.  Also had a long discussion regarding the treatment options which include lumpectomy.  Surgery planned on 3/28.   # Also discussed regarding radiation therapy post lumpectomy.  In general radiation the cuts down the risk of recurrence by 50%.  Family concerned about adjuvant radiation given her age/also prior poor tolerance to radiation [fatigue-see below].  Would recommend evaluation with Dr. Donella Stade.   # Also discussed option of antihormone therapy-like anastrozole/Aromasin to  cut down the risk of development development of ipsilateral/contralateral invasive/noninvasive breast cancer.  However will be recommended only if ER receptor positive.  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  # A.fib: On Eliquis stable.  # Genetics: Discussed with patient and family regarding potential etiologies of breast cancer including genetics.  Patient does have a family history of breast cancer; although not very strong.  Discussed regarding evaluation with genetic counseling.  Daughter wants to wait; and will let us know of their decision.  Thank you Dr.Byrnett for allowing me to participate in the care of your pleasant patient. Please do not hesitate to contact me with questions or concerns in the interim.  # DISPOSITION: # follow up in 2-3 weeks- MD; no labs- Dr.B    All questions were answered. The patient/family knows to call the clinic with any problems, questions or concerns.       Cammie Sickle, MD 03/05/2021 12:24 PM

## 2021-03-05 NOTE — Assessment & Plan Note (Addendum)
#  Right breast DCIS; with comedonecrosis.    # I had a long discussion with patient regarding pathology/natural history of DCIS.  Also had a long discussion regarding the treatment options which include lumpectomy.  Surgery planned on 3/28.   # Also discussed regarding radiation therapy post lumpectomy.  In general radiation the cuts down the risk of recurrence by 50%.  Family concerned about adjuvant radiation given her age/also prior poor tolerance to radiation [fatigue-see below].  Would recommend evaluation with Dr. Donella Stade.   # Also discussed option of antihormone therapy-like anastrozole/Aromasin to cut down the risk of development development of ipsilateral/contralateral invasive/noninvasive breast cancer.  However will be recommended only if ER receptor positive.  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  # A.fib: On Eliquis stable.  # Genetics: Discussed with patient and family regarding potential etiologies of breast cancer including genetics.  Patient does have a family history of breast cancer; although not very strong.  Discussed regarding evaluation with genetic counseling.  Daughter wants to wait; and will let us know of their decision.  Thank you Dr.Byrnett for allowing me to participate in the care of your pleasant patient. Please do not hesitate to contact me with questions or concerns in the interim.  # DISPOSITION: # follow up in 2-3 weeks- MD; no labs- Dr.B

## 2021-03-06 ENCOUNTER — Encounter: Payer: Self-pay | Admitting: General Surgery

## 2021-03-06 ENCOUNTER — Other Ambulatory Visit: Payer: Self-pay | Admitting: Cardiovascular Disease

## 2021-03-06 ENCOUNTER — Other Ambulatory Visit
Admission: RE | Admit: 2021-03-06 | Discharge: 2021-03-06 | Disposition: A | Discharge status: Home / Self Care | Payer: Medicare Other | Source: Ambulatory Visit | Attending: General Surgery | Admitting: General Surgery

## 2021-03-06 NOTE — Patient Instructions (Addendum)
Your procedure is scheduled on: Report to the Registration Desk on the 1st floor of the [facility]. To find out your arrival time, please call [phone] between 1PM - 3PM on:  REMEMBER: Instructions that are not followed completely may result in serious medical risk, up to and including death; or upon the discretion of your surgeon and anesthesiologist your surgery may need to be rescheduled.  Do not eat food after midnight the night before surgery.  No gum chewing, lozengers or hard candies.  You may however, drink CLEAR liquids up to 2 hours before you are scheduled to arrive for your surgery. Do not drink anything within 2 hours of your scheduled arrival time.  Type 1 and Type 2 diabetics should only drink water.  In addition, your doctor has ordered for you to drink the provided  Ensure Pre-Surgery Clear Carbohydrate Drink  Gatorade G2 Drinking this carbohydrate drink up to two hours before surgery helps to reduce insulin resistance and improve patient outcomes. Please complete drinking 2 hours prior to scheduled arrival time.  TAKE THESE MEDICATIONS THE MORNING OF SURGERY WITH A SIP OF WATER:  - diltiazem (CARDIZEM CD) 180 MG 24 hr capsule - omeprazole (PRILOSEC) 20 MG capsule, take one the night before and one on the morning of surgery - helps to prevent nausea after surgery.   Stop Metformin 2 days prior to surgery. Do not take on 03/26, 03/27, and do not take the morning of surgery.  Follow recommendations from Cardiologist, Pulmonologist or PCP regarding stopping Aspirin, Coumadin, Plavix, Eliquis, Pradaxa, or Pletal.- ELIQUIS 5 MG TABS tablet - stop taking 03/26, 03/27, and do not take the morning of surgery.  One week prior to surgery: Stop Anti-inflammatories (NSAIDS) such as Advil, Aleve, Ibuprofen, Motrin, Naproxen, Naprosyn and Aspirin based products such as Excedrin, Goodys Powder, BC Powder.  Stop ANY OVER THE COUNTER supplements until after surgery.  No  Alcohol for 24 hours before or after surgery.  No Smoking including e-cigarettes for 24 hours prior to surgery.  No chewable tobacco products for at least 6 hours prior to surgery.  No nicotine patches on the day of surgery.  Do not use any "recreational" drugs for at least a week prior to your surgery.  Please be advised that the combination of cocaine and anesthesia may have negative outcomes, up to and including death. If you test positive for cocaine, your surgery will be cancelled.  On the morning of surgery brush your teeth with toothpaste and water, you may rinse your mouth with mouthwash if you wish. Do not swallow any toothpaste or mouthwash.  Do not wear jewelry, make-up, hairpins, clips or nail polish.  Do not wear lotions, powders, or perfumes.   Do not shave body from the neck down 48 hours prior to surgery just in case you cut yourself which could leave a site for infection.  Also, freshly shaved skin may become irritated if using the CHG soap.  Contact lenses, hearing aids and dentures may not be worn into surgery.  Do not bring valuables to the hospital. [facility] is not responsible for any missing/lost belongings or valuables.   Notify your doctor if there is any change in your medical condition (cold, fever, infection).  Wear comfortable clothing (specific to your surgery type) to the hospital.  Plan for stool softeners for home use; pain medications have a tendency to cause constipation. You can also help prevent constipation by eating foods high in fiber such as fruits and vegetables  and drinking plenty of fluids as your diet allows.  After surgery, you can help prevent lung complications by doing breathing exercises.  Take deep breaths and cough every 1-2 hours. Your doctor may order a device called an Incentive Spirometer to help you take deep breaths. When coughing or sneezing, hold a pillow firmly against your incision with both hands. This is called  "splinting." Doing this helps protect your incision. It also decreases belly discomfort.  If you are being admitted to the hospital overnight, leave your suitcase in the car. After surgery it may be brought to your room.  If you are being discharged the day of surgery, you will not be allowed to drive home. You will need a responsible adult (18 years or older) to drive you home and stay with you that night.   If you are taking public transportation, you will need to have a responsible adult (18 years or older) with you. Please confirm with your physician that it is acceptable to use public transportation.   Please call the [facility]. at [phone] if you have any questions about these instructions.  Surgery Visitation Policy:  Patients undergoing a surgery or procedure may have one family member or support person with them as long as that person is not COVID-19 positive or experiencing its symptoms.  That person may remain in the waiting area during the procedure.  Inpatient Visitation:    Visiting hours are 7 a.m. to 8 p.m. Inpatients will be allowed two visitors daily. The visitors may change each day during the patient's stay. No visitors under the age of 33. Any visitor under the age of 52 must be accompanied by an adult. The visitor must pass COVID-19 screenings, use hand sanitizer when entering and exiting the patient's room and wear a mask at all times, including in the patient's room. Patients must also wear a mask when staff or their visitor are in the room. Masking is required regardless of vaccination status.

## 2021-03-06 NOTE — Progress Notes (Signed)
Perioperative Services  Pre-Admission/Anesthesia Testing Clinical Review  Date: 03/06/21  Patient Demographics:  Name: Barbara Mooney DOB:   November 26, 1934 MRN:   [id number]  Planned Surgical Procedure(s):    Case: [id number] Date/Time: 03/09/21 1051   Procedure: BREAST LUMPECTOMY (Right )   Anesthesia type: General   Pre-op diagnosis: right breast DCIS   Location: [facility] OR ROOM 05 / [place] [facility] FOR ANESTHESIA GROUP   Surgeons: Robert Bellow, MD    NOTE: Available PAT nursing documentation and vital signs have been reviewed. Clinical nursing staff has updated patient's PMH/PSHx, current medication list, and drug allergies/intolerances to ensure comprehensive history available to assist in medical decision making as it pertains to the aforementioned surgical procedure and anticipated anesthetic course.   Clinical Discussion:  Barbara Mooney is a 87 y.o. female who is submitted for pre-surgical anesthesia review and clearance prior to her undergoing the above procedure. Patient has never been a smoker. Pertinent PMH includes: paroxysmal atrial fibrillation, HTN, HLD, T2DM, hypothyroidism, lymphoma, peripheral edema, GERD (on daily PPI), OA.  Patient is followed by cardiology Rockey Situ, MD). She was last seen in the cardiology clinic on 12/23/2020; notes reviewed.  At the time of her clinic visit, patient doing well overall from a cardiovascular perspective.  She denied any chest pain, shortness of breath, PND, orthopnea, significant palpitations, vertiginous symptoms, and presyncope/syncope.  Patient complained of intermittent peripheral edema felt likely to be related to CCB therapy; GDR considered patient with a PMH significant for paroxysmal atrial fibrillation. CHA2DS2-VASc Score = 5 (age x 2, sex, HTN, T2DM). Patient chronically anticoagulated using apixaban; compliant with therapy with no evidence of GI bleeding. Last TTE performed on 08/21/2020 revealed normal left ventricular systolic  function with mild to moderate valvular insufficiency; LVEF 60-65% (see full interpretation of cardiovascular testing below).  Rate and rhythm controlled with diltiazem.  Patient on GDMT for her HTN  diagnosis.  Blood pressure reasonably controlled at 140/76 on currently prescribed CCB, diuretic, and ARB therapies.  Patient has not HLD diagnosis, however per notes from cardiology she is "not inclined to be on a cholesterol medication".  T2DM well controlled on currently prescribed regimen; Hgb A1c 6.8% when last checked on 01/07/2021. Functional capacity, as defined by DASI, is documented as being >/= 4 METS.  No changes were made to patient's medication regimen.  Patient to follow-up with outpatient cardiology at defined intervals for ongoing care and management.    Patient scheduled to undergo lumpectomy on 03/09/2021 with Dr. Hervey Ard.  Given patient's past medical history significant for cardiovascular diagnoses, presurgical cardiac clearance was sought by the performing surgeon's office and PAT team. Per cardiology, "based on patient's past medical history and time since his last clinic visit, patient would be at an overall ACCEPTABLE risk for the planned procedure without further cardiovascular testing or intervention at this time".  This patient is on daily anticoagulation therapy.  She has been instructed on recommendations for holding her apixaban for 2 days prior to her procedure with plans to restart as soon as postoperative bleeding risk felt to be minimized by primary attending surgeon.  Patient is aware that her last dose of apixaban will be on 03/06/2021.  Patient denies previous perioperative complications with anesthesia in the past. In review of the available records, it is noted that patient underwent a general anesthetic course here (ASA III) in 03/01/2018 without documented complications.   Vitals with BMI 03/05/2021 12/23/2020 07/23/2020  Height - 5' 5.5" 5\' 5"   Weight 179 lbs  10 oz  176 lbs 173 lbs 2 oz  BMI - 22.97 98.92  Systolic 119 417 408  Diastolic 144 76 60  Pulse 106 62 72    Providers/Specialists:   NOTE: Primary physician provider listed below. Patient may have been seen by APP or partner within same practice.   PROVIDER ROLE / SPECIALTY LAST OV  Bary Castilla Forest Gleason, MD General Surgery  03/03/2021  Marinda Elk, MD Primary Care Provider  01/21/2021  Ida Rogue, MD Cardiology  12/23/2020   Allergies:  Nitrofurantoin  Current Home Medications:   No current facility-administered medications for this encounter.   Marland Kitchen acetaminophen (TYLENOL) 500 MG tablet  . CRANBERRY PO  . cyanocobalamin (,VITAMIN B-12,) 1000 MCG/ML injection  . diltiazem (CARDIZEM CD) 180 MG 24 hr capsule  . furosemide (LASIX) 20 MG tablet  . metFORMIN (GLUCOPHAGE) 500 MG tablet  . olmesartan (BENICAR) 40 MG tablet  . omeprazole (PRILOSEC) 20 MG capsule  . Trolamine Salicylate (BLUE-EMU HEMP EX)  . ELIQUIS 5 MG TABS tablet   History:   Past Medical History:  Diagnosis Date  . Arthritis   . Bruises easily   . Current use of long term anticoagulation    Apixaban  . Diabetes mellitus   . Edema    FEET/ LEGS  . GERD (gastroesophageal reflux disease)   . HLD (hyperlipidemia)   . Hypertension   . Hypothyroidism   . Lymphoma ([place]) 12/2017   RADIATION RIGHT LEG   . MRSA infection    4 TO 5 YEARS AGO ON BACK  . Multiple thyroid nodules   . Paroxysmal atrial fibrillation (HCC)   . Personal history of radiation therapy 12/2017   lymphoma   Past Surgical History:  Procedure Laterality Date  . BONE BIOPSY     2018  . BREAST BIOPSY Right 02/26/2021   ffirm bx-"X" clip-path pending  . CARDIOVERSION N/A 01/20/2017   Procedure: CARDIOVERSION;  Surgeon: Minna Merritts, MD;  Location: [facility] ORS;  Service: Cardiovascular;  Laterality: N/A;  . CARDIOVERSION     AFIB 2018  . CATARACT EXTRACTION W/PHACO Right 02/15/2018   Procedure: CATARACT EXTRACTION PHACO AND  INTRAOCULAR LENS PLACEMENT (IOC);  Surgeon: Birder Robson, MD;  Location: [facility] ORS;  Service: Ophthalmology;  Laterality: Right;  [place] 01:22.8 AP% 18.9 CDE 15.62 Fluid Pack Lot # [id number]  . CATARACT EXTRACTION W/PHACO Left 03/08/2018   Procedure: CATARACT EXTRACTION PHACO AND INTRAOCULAR LENS PLACEMENT ([place]);  Surgeon: Birder Robson, MD;  Location: [facility] ORS;  Service: Ophthalmology;  Laterality: Left;  [place] 01:05 AP% 15.8 CDE 10.28 Fluid pak lot # [id number]  . COLONOSCOPY    . EYE SURGERY     Family History  Problem Relation Age of Onset  . Breast cancer Sister 32  . Heart disease Brother   . Ovarian cancer Maternal Aunt        another aunt- colon cancer  . Stroke Sister   . Arrhythmia Sister        A-Fib  . Kidney cancer Neg Hx   . Bladder Cancer Neg Hx    Social History   Tobacco Use  . Smoking status: Never Smoker  . Smokeless tobacco: Never Used  Substance Use Topics  . Alcohol use: No  . Drug use: No    Pertinent Clinical Results:  LABS: Labs reviewed: Acceptable for surgery.        Hospital Outpatient Visit on 03/05/2021  Component Date Value Ref Range Status  . SARS Coronavirus 2 03/05/2021 NEGATIVE  NEGATIVE Final   Comment: (NOTE) SARS-CoV-2 target nucleic acids are NOT DETECTED.  The SARS-CoV-2 RNA is generally detectable in upper and lower respiratory specimens during the acute phase of infection. Negative results do not preclude SARS-CoV-2 infection, do not rule out co-infections with other pathogens, and should not be used as the sole basis for treatment or other patient management decisions. Negative results must be combined with clinical observations, patient history, and epidemiological information. The expected result is Negative.  Fact Sheet for Patients: SugarRoll.be  Fact Sheet for Healthcare Providers: https://www.woods-mathews.com/  This test is not yet approved or cleared by the [place] FDA and  has been authorized for detection and/or diagnosis of SARS-CoV-2 by FDA under an Emergency Use Authorization (EUA). This EUA will remain  in effect (meaning this test can be used) for the duration of the COVID-19 declaration under Se                          ction 564(b)(1) of the Act, 21 U.S.C. section 360bbb-3(b)(1), unless the authorization is terminated or revoked sooner.  Performed at [facility], [place] [address] [place], [place] [address]     ECG: Date: 07/23/2020 Time ECG obtained: 1328 PM Rate: 72 bpm Rhythm: Atrial fibrillation; RBBB Axis (leads I and aVF): Normal Normal (up/up).Marland KitchenMarland KitchenLEFT axis (up/down)...RIGHT axis (down/up).Marland KitchenMarland KitchenEXTREME axis (down/down) Intervals: QRS 124 ms. QTc 405 ms. ST segment and T wave changes: No evidence of acute ST segment elevation or depression Comparison: Similar to previous tracing obtained on 01/01/2020   IMAGING / PROCEDURES: ECHOCARDIOGRAM performed on 08/21/2020 1. Left ventricular ejection fraction, by estimation, is 60 to 65%.  2. The left ventricle has normal function.  3. The left ventricle has no regional wall motion abnormalities.  4. Left ventricular diastolic parameters are indeterminate.  5. Right ventricular systolic function is normal.  6. The right ventricular size is normal.  7. There is mildly elevated pulmonary artery systolic pressure; estimated right ventricular systolic pressure is 48.5 mmHg.  8. Left atrial size was moderately dilated. 9. Right atrial size was mildly dilated.  10. Mild to moderate mitral valve regurgitation.  11. Tricuspid valve regurgitation is mild to moderate.  12. Aortic valve regurgitation is mild. aortic valve sclerosis/calcification is present, without any evidence of aortic stenosis.   Impression and Plan:  Barbara Mooney has been referred for pre-anesthesia review and clearance prior to her undergoing the planned anesthetic and procedural courses. Available labs,  pertinent testing, and imaging results were personally reviewed by me. This patient has been appropriately cleared by cardiology with an overall ACCEPTABLE risk of significant perioperative cardiovascular complications.  Based on clinical review performed today (03/06/21), barring any significant acute changes in the patient's overall condition, it is anticipated that she will be able to proceed with the planned surgical intervention. Any acute changes in clinical condition may necessitate her procedure being postponed and/or cancelled. Pre-surgical instructions were reviewed with the patient during her PAT appointment and questions were fielded by PAT clinical staff.  Barbara Loh, MSN, APRN, FNP-C, CEN [facility]  Peri-operative Services Nurse Practitioner Phone: [phone] 03/06/21 12:56 PM  NOTE: This note has been prepared using Dragon dictation software. Despite my best ability to proofread, there is always the potential that unintentional transcriptional errors may still occur from this process.

## 2021-03-06 NOTE — Telephone Encounter (Signed)
Pt's age 87, wt 81.5 kg, SCr 1.0, CrCl 53.88, last ov w/ TG 12/23/20.

## 2021-03-09 ENCOUNTER — Encounter
Admission: RE | Disposition: A | Discharge status: Home / Self Care | Payer: Self-pay | Source: Home / Self Care | Attending: General Surgery

## 2021-03-09 ENCOUNTER — Ambulatory Visit
Admission: RE | Admit: 2021-03-09 | Discharge: 2021-03-09 | Disposition: A | Discharge status: Home / Self Care | Payer: Medicare Other | Source: Ambulatory Visit | Attending: General Surgery | Admitting: General Surgery

## 2021-03-09 ENCOUNTER — Encounter: Payer: Self-pay | Admitting: General Surgery

## 2021-03-09 ENCOUNTER — Ambulatory Visit: Payer: Medicare Other | Admitting: Urgent Care

## 2021-03-09 ENCOUNTER — Ambulatory Visit
Admission: RE | Admit: 2021-03-09 | Discharge: 2021-03-09 | Disposition: A | Discharge status: Home / Self Care | Payer: Medicare Other | Attending: General Surgery | Admitting: General Surgery

## 2021-03-09 ENCOUNTER — Other Ambulatory Visit: Payer: Self-pay

## 2021-03-09 DIAGNOSIS — Z8572 Personal history of non-Hodgkin lymphomas: Secondary | ICD-10-CM | POA: Insufficient documentation

## 2021-03-09 DIAGNOSIS — E119 Type 2 diabetes mellitus without complications: Secondary | ICD-10-CM | POA: Diagnosis not present

## 2021-03-09 DIAGNOSIS — Z7901 Long term (current) use of anticoagulants: Secondary | ICD-10-CM | POA: Diagnosis not present

## 2021-03-09 DIAGNOSIS — D0511 Intraductal carcinoma in situ of right breast: Secondary | ICD-10-CM

## 2021-03-09 DIAGNOSIS — I10 Essential (primary) hypertension: Secondary | ICD-10-CM | POA: Diagnosis not present

## 2021-03-09 DIAGNOSIS — D241 Benign neoplasm of right breast: Secondary | ICD-10-CM | POA: Insufficient documentation

## 2021-03-09 DIAGNOSIS — Z7984 Long term (current) use of oral hypoglycemic drugs: Secondary | ICD-10-CM | POA: Insufficient documentation

## 2021-03-09 DIAGNOSIS — Z170 Estrogen receptor positive status [ER+]: Secondary | ICD-10-CM | POA: Insufficient documentation

## 2021-03-09 DIAGNOSIS — I480 Paroxysmal atrial fibrillation: Secondary | ICD-10-CM | POA: Diagnosis not present

## 2021-03-09 DIAGNOSIS — Z79899 Other long term (current) drug therapy: Secondary | ICD-10-CM | POA: Insufficient documentation

## 2021-03-09 DIAGNOSIS — C50911 Malignant neoplasm of unspecified site of right female breast: Secondary | ICD-10-CM

## 2021-03-09 DIAGNOSIS — C50919 Malignant neoplasm of unspecified site of unspecified female breast: Secondary | ICD-10-CM

## 2021-03-09 DIAGNOSIS — Z923 Personal history of irradiation: Secondary | ICD-10-CM | POA: Insufficient documentation

## 2021-03-09 DIAGNOSIS — Z881 Allergy status to other antibiotic agents status: Secondary | ICD-10-CM | POA: Diagnosis not present

## 2021-03-09 HISTORY — DX: Paroxysmal atrial fibrillation: I48.0

## 2021-03-09 HISTORY — DX: Long term (current) use of anticoagulants: Z79.01

## 2021-03-09 HISTORY — PX: BREAST LUMPECTOMY: SHX2

## 2021-03-09 HISTORY — DX: Malignant neoplasm of unspecified site of right female breast: C50.911

## 2021-03-09 HISTORY — DX: Intraductal carcinoma in situ of right breast: D05.11

## 2021-03-09 HISTORY — DX: Malignant neoplasm of unspecified site of unspecified female breast: C50.919

## 2021-03-09 HISTORY — DX: Hyperlipidemia, unspecified: E78.5

## 2021-03-09 LAB — GLUCOSE, CAPILLARY
Glucose-Capillary: 112 mg/dL — ABNORMAL HIGH (ref 70–99)
Glucose-Capillary: 104 mg/dL — ABNORMAL HIGH (ref 70–99)

## 2021-03-09 SURGERY — BREAST LUMPECTOMY
Anesthesia: General | Site: Breast | Laterality: Right

## 2021-03-09 MED ORDER — DEXAMETHASONE SODIUM PHOSPHATE 10 MG/ML IJ SOLN
INTRAMUSCULAR | Status: DC | PRN
  Administered 2021-03-09: 5 mg via INTRAVENOUS

## 2021-03-09 MED ORDER — CHLORHEXIDINE GLUCONATE 0.12 % MT SOLN
OROMUCOSAL | Status: AC
  Filled 2021-03-09: qty 15

## 2021-03-09 MED ORDER — FENTANYL CITRATE (PF) 100 MCG/2ML IJ SOLN
INTRAMUSCULAR | Status: AC
  Filled 2021-03-09: qty 2

## 2021-03-09 MED ORDER — FENTANYL CITRATE (PF) 100 MCG/2ML IJ SOLN
25.0000 ug | INTRAMUSCULAR | Status: DC | PRN
  Administered 2021-03-09 (×2): 25 ug via INTRAVENOUS

## 2021-03-09 MED ORDER — ONDANSETRON HCL 4 MG/2ML IJ SOLN
4.0000 mg | Freq: Once | INTRAMUSCULAR | Status: AC | PRN
  Administered 2021-03-09: 4 mg via INTRAVENOUS

## 2021-03-09 MED ORDER — FENTANYL CITRATE (PF) 100 MCG/2ML IJ SOLN
INTRAMUSCULAR | Status: DC | PRN
  Administered 2021-03-09 (×3): 25 ug via INTRAVENOUS

## 2021-03-09 MED ORDER — PHENYLEPHRINE HCL (PRESSORS) 10 MG/ML IV SOLN
INTRAVENOUS | Status: DC | PRN
  Administered 2021-03-09 (×3): 100 ug via INTRAVENOUS

## 2021-03-09 MED ORDER — CEFAZOLIN SODIUM-DEXTROSE 2-4 GM/100ML-% IV SOLN
2.0000 g | INTRAVENOUS | Status: AC
  Administered 2021-03-09: 2 g via INTRAVENOUS

## 2021-03-09 MED ORDER — PROPOFOL 10 MG/ML IV BOLUS
INTRAVENOUS | Status: AC
  Filled 2021-03-09: qty 20

## 2021-03-09 MED ORDER — LIDOCAINE HCL (CARDIAC) PF 100 MG/5ML IV SOSY
PREFILLED_SYRINGE | INTRAVENOUS | Status: DC | PRN
  Administered 2021-03-09: 80 mg via INTRAVENOUS

## 2021-03-09 MED ORDER — BUPIVACAINE-EPINEPHRINE (PF) 0.5% -1:200000 IJ SOLN
INTRAMUSCULAR | Status: DC | PRN
  Administered 2021-03-09: 30 mL

## 2021-03-09 MED ORDER — HYDROCODONE-ACETAMINOPHEN 5-325 MG PO TABS: 1.0000 | ORAL_TABLET | ORAL | 0 refills | Status: DC | PRN

## 2021-03-09 MED ORDER — PROPOFOL 10 MG/ML IV BOLUS
INTRAVENOUS | Status: DC | PRN
  Administered 2021-03-09: 110 mg via INTRAVENOUS
  Administered 2021-03-09: 30 mg via INTRAVENOUS

## 2021-03-09 MED ORDER — ACETAMINOPHEN 10 MG/ML IV SOLN
INTRAVENOUS | Status: AC
  Filled 2021-03-09: qty 100

## 2021-03-09 MED ORDER — APIXABAN 5 MG PO TABS: 5.0000 mg | ORAL_TABLET | Freq: Two times a day (BID) | ORAL | 1 refills | Status: DC

## 2021-03-09 MED ORDER — CHLORHEXIDINE GLUCONATE CLOTH 2 % EX PADS
6.0000 | MEDICATED_PAD | Freq: Once | CUTANEOUS | Status: AC
  Administered 2021-03-09: 6 via TOPICAL

## 2021-03-09 MED ORDER — ONDANSETRON HCL 4 MG/2ML IJ SOLN
INTRAMUSCULAR | Status: DC | PRN
  Administered 2021-03-09: 4 mg via INTRAVENOUS

## 2021-03-09 MED ORDER — CHLORHEXIDINE GLUCONATE 0.12 % MT SOLN
15.0000 mL | Freq: Once | OROMUCOSAL | Status: AC
  Administered 2021-03-09: 15 mL via OROMUCOSAL

## 2021-03-09 MED ORDER — ACETAMINOPHEN 10 MG/ML IV SOLN
INTRAVENOUS | Status: DC | PRN
  Administered 2021-03-09: 1000 mg via INTRAVENOUS

## 2021-03-09 MED ORDER — ONDANSETRON HCL 4 MG/2ML IJ SOLN
INTRAMUSCULAR | Status: AC
  Filled 2021-03-09: qty 2

## 2021-03-09 MED ORDER — ORAL CARE MOUTH RINSE: 15.0000 mL | Freq: Once | OROMUCOSAL | Status: AC

## 2021-03-09 MED ORDER — SODIUM CHLORIDE 0.9 % IV SOLN: INTRAVENOUS | Status: DC

## 2021-03-09 MED ORDER — DEXAMETHASONE SODIUM PHOSPHATE 10 MG/ML IJ SOLN
INTRAMUSCULAR | Status: AC
  Filled 2021-03-09: qty 1

## 2021-03-09 MED ORDER — BUPIVACAINE-EPINEPHRINE (PF) 0.5% -1:200000 IJ SOLN
INTRAMUSCULAR | Status: AC
  Filled 2021-03-09: qty 30

## 2021-03-09 MED ORDER — PHENYLEPHRINE HCL (PRESSORS) 10 MG/ML IV SOLN
INTRAVENOUS | Status: AC
  Filled 2021-03-09: qty 1

## 2021-03-09 MED ORDER — LIDOCAINE HCL (PF) 2 % IJ SOLN
INTRAMUSCULAR | Status: AC
  Filled 2021-03-09: qty 5

## 2021-03-09 MED ORDER — CEFAZOLIN SODIUM-DEXTROSE 2-4 GM/100ML-% IV SOLN
INTRAVENOUS | Status: AC
  Filled 2021-03-09: qty 100

## 2021-03-09 SURGICAL SUPPLY — 55 items
APL PRP STRL LF DISP 70% ISPRP (MISCELLANEOUS) ×1
BINDER BREAST LRG (GAUZE/BANDAGES/DRESSINGS) ×2 IMPLANT
BINDER BREAST MEDIUM (GAUZE/BANDAGES/DRESSINGS) IMPLANT
BINDER BREAST XLRG (GAUZE/BANDAGES/DRESSINGS) IMPLANT
BINDER BREAST XXLRG (GAUZE/BANDAGES/DRESSINGS) IMPLANT
BLADE BOVIE TIP EXT 4 (BLADE) ×2 IMPLANT
BLADE PHOTON ILLUMINATED (MISCELLANEOUS) IMPLANT
BLADE SURG 15 STRL SS SAFETY (BLADE) ×4 IMPLANT
BULB RESERV EVAC DRAIN JP 100C (MISCELLANEOUS) IMPLANT
CANISTER SUCT 1200ML W/VALVE (MISCELLANEOUS) ×2 IMPLANT
CHLORAPREP W/TINT 26 (MISCELLANEOUS) ×2 IMPLANT
CNTNR SPEC 2.5X3XGRAD LEK (MISCELLANEOUS)
CONT SPEC 4OZ STER OR WHT (MISCELLANEOUS)
CONT SPEC 4OZ STRL OR WHT (MISCELLANEOUS)
CONTAINER SPEC 2.5X3XGRAD LEK (MISCELLANEOUS) IMPLANT
COVER PROBE FLX POLY STRL (MISCELLANEOUS) ×2 IMPLANT
COVER WAND RF STERILE (DRAPES) ×2 IMPLANT
DEVICE DUBIN SPECIMEN MAMMOGRA (MISCELLANEOUS) ×2 IMPLANT
DRAIN CHANNEL JP 15F RND 16 (MISCELLANEOUS) IMPLANT
DRAPE LAPAROTOMY TRNSV 106X77 (MISCELLANEOUS) ×2 IMPLANT
DRSG GAUZE FLUFF 36X18 (GAUZE/BANDAGES/DRESSINGS) ×2 IMPLANT
DRSG TELFA 3X8 NADH (GAUZE/BANDAGES/DRESSINGS) ×2 IMPLANT
ELECT CAUTERY BLADE TIP 2.5 (TIP) ×2
ELECT REM PT RETURN 9FT ADLT (ELECTROSURGICAL) ×2
ELECTRODE CAUTERY BLDE TIP 2.5 (TIP) ×1 IMPLANT
ELECTRODE REM PT RTRN 9FT ADLT (ELECTROSURGICAL) ×1 IMPLANT
GLOVE SURG ENC MOIS LTX SZ7.5 (GLOVE) ×2 IMPLANT
GLOVE SURG UNDER LTX SZ8 (GLOVE) ×2 IMPLANT
GOWN STRL REUS W/ TWL LRG LVL3 (GOWN DISPOSABLE) ×2 IMPLANT
GOWN STRL REUS W/TWL LRG LVL3 (GOWN DISPOSABLE) ×4
KIT TURNOVER KIT A (KITS) ×2 IMPLANT
LABEL OR SOLS (LABEL) ×2 IMPLANT
MANIFOLD NEPTUNE II (INSTRUMENTS) ×2 IMPLANT
MARGIN MAP 10MM (MISCELLANEOUS) ×2 IMPLANT
NEEDLE HYPO 22GX1.5 SAFETY (NEEDLE) ×2 IMPLANT
NEEDLE HYPO 25X1 1.5 SAFETY (NEEDLE) ×2 IMPLANT
PACK BASIN MINOR ARMC (MISCELLANEOUS) ×2 IMPLANT
PENCIL ELECTRO HAND CTR (MISCELLANEOUS) ×2 IMPLANT
SHEARS FOC LG CVD HARMONIC 17C (MISCELLANEOUS) IMPLANT
SHEARS HARMONIC 9CM CVD (BLADE) IMPLANT
STRIP CLOSURE SKIN 1/2X4 (GAUZE/BANDAGES/DRESSINGS) ×2 IMPLANT
SUT ETHILON 3-0 FS-10 30 BLK (SUTURE) ×2
SUT SILK 2 0 (SUTURE)
SUT SILK 2-0 18XBRD TIE 12 (SUTURE) IMPLANT
SUT VIC AB 2-0 CT1 27 (SUTURE) ×6
SUT VIC AB 2-0 CT1 TAPERPNT 27 (SUTURE) ×3 IMPLANT
SUT VIC AB 4-0 FS2 27 (SUTURE) ×2 IMPLANT
SUT VICRYL+ 3-0 144IN (SUTURE) IMPLANT
SUTURE EHLN 3-0 FS-10 30 BLK (SUTURE) ×1 IMPLANT
SWABSTK COMLB BENZOIN TINCTURE (MISCELLANEOUS) ×2 IMPLANT
SYR 10ML LL (SYRINGE) ×2 IMPLANT
SYR BULB IRRIG 60ML STRL (SYRINGE) ×2 IMPLANT
SYR CONTROL 10ML LL (SYRINGE) ×2 IMPLANT
TAPE TRANSPORE STRL 2 31045 (GAUZE/BANDAGES/DRESSINGS) IMPLANT
WATER STERILE IRR 1000ML POUR (IV SOLUTION) ×2 IMPLANT

## 2021-03-09 NOTE — Anesthesia Preprocedure Evaluation (Signed)
Anesthesia Evaluation  Patient identified by MRN, date of birth, ID band Patient awake    Reviewed: Allergy & Precautions, H&P , NPO status , Patient's Chart, lab work & pertinent test results, reviewed documented beta blocker date and time   Airway Mallampati: II  TM Distance: >3 FB Neck ROM: full    Dental  (+) Teeth Intact   Pulmonary neg pulmonary ROS,    Pulmonary exam normal        Cardiovascular Exercise Tolerance: Poor hypertension, On Medications negative cardio ROS  Atrial Fibrillation  Rate:Normal     Neuro/Psych negative neurological ROS  negative psych ROS   GI/Hepatic Neg liver ROS, GERD  Medicated,  Endo/Other  diabetes, Oral Hypoglycemic AgentsHypothyroidism   Renal/GU negative Renal ROS  negative genitourinary   Musculoskeletal   Abdominal   Peds  Hematology  (+) Blood dyscrasia, anemia ,   Anesthesia Other Findings   Reproductive/Obstetrics negative OB ROS                             Anesthesia Physical Anesthesia Plan  ASA: III  Anesthesia Plan: General LMA   Post-op Pain Management:    Induction:   PONV Risk Score and Plan: 4 or greater  Airway Management Planned:   Additional Equipment:   Intra-op Plan:   Post-operative Plan:   Informed Consent: I have reviewed the patients History and Physical, chart, labs and discussed the procedure including the risks, benefits and alternatives for the proposed anesthesia with the patient or authorized representative who has indicated his/her understanding and acceptance.       Plan Discussed with: CRNA  Anesthesia Plan Comments:         Anesthesia Quick Evaluation

## 2021-03-09 NOTE — Discharge Instructions (Signed)

## 2021-03-09 NOTE — Progress Notes (Signed)
EKG at bedside, Dr. Andree Elk made aware

## 2021-03-09 NOTE — H&P (Signed)
Barbara Mooney [id number] 05/27/1934     HPI:  Healthy 87 y/o with newly diagnosed DCIS. For wide excision. Last dose of Eliquis on March 25.    Medications Prior to Admission  Medication Sig Dispense Refill Last Dose  . acetaminophen (TYLENOL) 500 MG tablet Take 500 mg by mouth at bedtime.   03/08/2021 at Unknown time  . CRANBERRY PO Take 1 capsule by mouth 2 (two) times daily.    Past Week at Unknown time  . cyanocobalamin (,VITAMIN B-12,) 1000 MCG/ML injection Inject 1,000 mcg into the muscle every 30 (thirty) days.    Past Month at Unknown time  . diltiazem (CARDIZEM CD) 180 MG 24 hr capsule TAKE 1 CAPSULE BY MOUTH TWICE A DAY (Patient taking differently: Take 180 mg by mouth 2 (two) times daily.) 180 capsule 0 03/09/2021 at Unknown time  . furosemide (LASIX) 20 MG tablet TAKE 1 TABLET (20 MG TOTAL) BY MOUTH DAILY AS NEEDED FOR FLUID OR EDEMA (SOB). 90 tablet 0 Past Month at Unknown time  . metFORMIN (GLUCOPHAGE) 500 MG tablet Take 500 mg by mouth 2 (two) times daily with a meal.   03/05/2021  . olmesartan (BENICAR) 40 MG tablet Take 1 tablet (40 mg total) by mouth daily. 90 tablet 1 03/08/2021 at Unknown time  . omeprazole (PRILOSEC) 20 MG capsule Take 1 capsule (20 mg total) by mouth daily. 30 capsule 6 03/09/2021 at Unknown time  . Trolamine Salicylate (BLUE-EMU HEMP EX) Apply 1 application topically daily as needed (Knee pain).   Past Week at Unknown time  . ELIQUIS 5 MG TABS tablet TAKE 1 TABLET BY MOUTH TWICE A DAY 180 tablet 1 03/05/2021   Allergies  Allergen Reactions  . Nitrofurantoin Diarrhea, Nausea Only and Other (See Comments)   Past Medical History:  Diagnosis Date  . Arthritis   . Bruises easily   . Current use of long term anticoagulation    Apixaban  . Diabetes mellitus   . Edema    FEET/ LEGS  . GERD (gastroesophageal reflux disease)   . HLD (hyperlipidemia)   . Hypertension   . Hypothyroidism   . Lymphoma ([place]) 12/2017   RADIATION RIGHT LEG   . MRSA infection     remote history; posterior torso  . Multiple thyroid nodules   . Paroxysmal atrial fibrillation (HCC)   . Personal history of radiation therapy 12/2017   lymphoma   Past Surgical History:  Procedure Laterality Date  . BONE BIOPSY     2018  . BREAST BIOPSY Right 02/26/2021   ffirm bx-"X" clip-path pending  . CARDIOVERSION N/A 01/20/2017   Procedure: CARDIOVERSION;  Surgeon: Minna Merritts, MD;  Location: [facility] ORS;  Service: Cardiovascular;  Laterality: N/A;  . CARDIOVERSION     AFIB 2018  . CATARACT EXTRACTION W/PHACO Right 02/15/2018   Procedure: CATARACT EXTRACTION PHACO AND INTRAOCULAR LENS PLACEMENT (IOC);  Surgeon: Birder Robson, MD;  Location: [facility] ORS;  Service: Ophthalmology;  Laterality: Right;  [place] 01:22.8 AP% 18.9 CDE 15.62 Fluid Pack Lot # [id number]  . CATARACT EXTRACTION W/PHACO Left 03/08/2018   Procedure: CATARACT EXTRACTION PHACO AND INTRAOCULAR LENS PLACEMENT ([place]);  Surgeon: Birder Robson, MD;  Location: [facility] ORS;  Service: Ophthalmology;  Laterality: Left;  [place] 01:05 AP% 15.8 CDE 10.28 Fluid pak lot # [id number]  . COLONOSCOPY    . EYE SURGERY     Social History   Socioeconomic History  . Marital status: Widowed    Spouse name: Not on file  .  Number of children: Not on file  . Years of education: Not on file  . Highest education level: Not on file  Occupational History  . Not on file  Tobacco Use  . Smoking status: Never Smoker  . Smokeless tobacco: Never Used  Substance and Sexual Activity  . Alcohol use: No  . Drug use: No  . Sexual activity: Not on file  Other Topics Concern  . Not on file  Social History Narrative   Lives by self; son lives about 2 miles. Never smoked; no alcohol. Used to [employer].    Social Determinants of Health   Financial Resource Strain: Not on file  Food Insecurity: Not on file  Transportation Needs: Not on file  Physical Activity: Not on file  Stress: Not on file  Social Connections: Not on file  Intimate  Partner Violence: Not on file   Social History   Social History Narrative   Lives by self; son lives about 2 miles. Never smoked; no alcohol. Used to [employer].      ROS: Negative.     PE: HEENT: Negative. Lungs: Clear. Cardio: RR.    Assessment/Plan:  Proceed with planned right breast wide excision.   Forest Gleason [facility] 03/09/2021

## 2021-03-09 NOTE — Op Note (Signed)
Preoperative diagnosis: High-grade carcinoma of the right breast, lower inner quadrant.  Desire for breast conservation.  Postoperative diagnosis: Same.  Operative procedure: Right breast wide excision with ultrasound guidance.  Tissue transfer.  Operating surgeon: Hervey Ard, MD.  Anesthesia: General by LMA, Marcaine 0.5% with 1: 200,000 units of epinephrine, 30 cc.  Estimated blood loss: Less than 5 cc.  Clinical note: This 87 year old woman was noted to have a new cluster of microcalcifications and subsequent vacuum biopsy showed evidence of high-grade DCIS.  She desired breast conservation.  As her biopsy had been completed on Eliquis she had a significant hematoma.  She is admitted at this time for planned wide local excision.  The patient received Ancef prior to the procedure due to the presence of the hematoma.  SCD stockings for DVT prevention.  Operative note: The patient underwent general anesthesia and tolerated this well.  The breast was then cleansed with ChloraPrep and draped.  Ultrasound was used to outline the borders of the hematoma cavity which extended up to the dermis near the periphery of the breast.  A photograph was obtained for the record.  This encompassed approximately a 2-1/2-3 cm wide area from the 3 to 5 o'clock position and extended to the nipple areolar area.  Local anesthesia was infiltrated.  It was elected to resect a ellipse of skin to minimize violation of the hematoma cavity.  The skin was incised sharply and the remaining dissection completed with electrocautery.  A block of tissue measuring 3 x 5 x 4 cm including the pectoralis fascia was then excised.  This left a 10 cm defect.  The specimen radiograph confirmed the previously placed clip.  The breast was then elevated off the underlying pectoralis muscle circumferentially.  The deep tissue including the pectoralis fascia was then approximated with interrupted 2-0 Vicryl figure-of-eight sutures.  A small  puckering of the skin near the inframammary fold was freed by division of the adipose tissue just below the dermis with cautery.  This freed this up nicely.  The remaining breast tissue was approximated with a second layer of 2-0 Vicryl sutures.  The most superficial layer was closed with interrupted 2-0 Vicryl sutures.  The skin was closed with a running 4-0 Vicryl subcuticular suture.  Benzoin, Steri-Strips, Telfa and a compressive wrap was applied.  The patient tolerated the procedure well and was taken to the recovery in stable condition.

## 2021-03-09 NOTE — Transfer of Care (Signed)
Immediate Anesthesia Transfer of Care Note  Patient: Barbara Mooney  Procedure(s) Performed: BREAST LUMPECTOMY (Right Breast)  Patient Location: PACU  Anesthesia Type:General  Level of Consciousness: drowsy and patient cooperative  Airway & Oxygen Therapy: Patient Spontanous Breathing  Post-op Assessment: Report given to RN and Post -op Vital signs reviewed and stable  Post vital signs: Reviewed and stable  Last Vitals:  Vitals Value Taken Time  BP 135/79 03/09/21 1209  Temp    Pulse 87 03/09/21 1212  Resp 15 03/09/21 1212  SpO2 95 % 03/09/21 1212  Vitals shown include unvalidated device data.  Last Pain:  Vitals:   03/09/21 0944  TempSrc: Temporal  PainSc: 0-No pain         Complications: No complications documented.

## 2021-03-10 ENCOUNTER — Encounter: Payer: Self-pay | Admitting: General Surgery

## 2021-03-10 NOTE — Anesthesia Postprocedure Evaluation (Signed)
Anesthesia Post Note  Patient: Barbara Mooney  Procedure(s) Performed: BREAST LUMPECTOMY (Right Breast)  Patient location during evaluation: PACU Anesthesia Type: General Level of consciousness: awake and alert Pain management: pain level controlled Vital Signs Assessment: post-procedure vital signs reviewed and stable Respiratory status: spontaneous breathing, nonlabored ventilation, respiratory function stable and patient connected to nasal cannula oxygen Cardiovascular status: blood pressure returned to baseline and stable Postop Assessment: no apparent nausea or vomiting Anesthetic complications: no   No complications documented.   Last Vitals:  Vitals:   03/09/21 1309 03/09/21 1324  BP: (!) 155/68 139/69  Pulse:  63  Resp:    Temp:    SpO2:  97%    Last Pain:  Vitals:   03/09/21 1301  TempSrc: Temporal  PainSc: 3                  Molli Barrows

## 2021-03-12 ENCOUNTER — Other Ambulatory Visit: Payer: Self-pay | Admitting: Pathology

## 2021-03-12 LAB — SURGICAL PATHOLOGY

## 2021-03-13 ENCOUNTER — Encounter: Payer: Self-pay | Admitting: *Deleted

## 2021-03-13 NOTE — Progress Notes (Addendum)
Patient had surgery this week.  Left message to follow up to see how she is doing post up.  Patients daughter, [name] called back.  States her mom is doing good, and staying with her at this time.  No needs at this time.  She has follow up scheduled with Dr. Jacinto Reap.

## 2021-03-20 ENCOUNTER — Ambulatory Visit: Payer: Medicare Other | Admitting: Internal Medicine

## 2021-03-25 ENCOUNTER — Other Ambulatory Visit: Payer: Self-pay

## 2021-03-25 ENCOUNTER — Inpatient Hospital Stay: Payer: Medicare Other | Attending: Internal Medicine | Admitting: Internal Medicine

## 2021-03-25 ENCOUNTER — Ambulatory Visit
Admission: RE | Admit: 2021-03-25 | Discharge: 2021-03-25 | Disposition: A | Discharge status: Home / Self Care | Payer: Medicare Other | Source: Ambulatory Visit | Attending: Radiation Oncology | Admitting: Radiation Oncology

## 2021-03-25 VITALS — BP 150/59 | HR 79 | Temp 97.5°F | Wt 179.5 lb

## 2021-03-25 DIAGNOSIS — Z7901 Long term (current) use of anticoagulants: Secondary | ICD-10-CM | POA: Diagnosis not present

## 2021-03-25 DIAGNOSIS — E042 Nontoxic multinodular goiter: Secondary | ICD-10-CM | POA: Insufficient documentation

## 2021-03-25 DIAGNOSIS — Z79899 Other long term (current) drug therapy: Secondary | ICD-10-CM | POA: Insufficient documentation

## 2021-03-25 DIAGNOSIS — I4891 Unspecified atrial fibrillation: Secondary | ICD-10-CM | POA: Insufficient documentation

## 2021-03-25 DIAGNOSIS — E119 Type 2 diabetes mellitus without complications: Secondary | ICD-10-CM | POA: Insufficient documentation

## 2021-03-25 DIAGNOSIS — Z7984 Long term (current) use of oral hypoglycemic drugs: Secondary | ICD-10-CM | POA: Insufficient documentation

## 2021-03-25 DIAGNOSIS — E785 Hyperlipidemia, unspecified: Secondary | ICD-10-CM | POA: Insufficient documentation

## 2021-03-25 DIAGNOSIS — I10 Essential (primary) hypertension: Secondary | ICD-10-CM | POA: Insufficient documentation

## 2021-03-25 DIAGNOSIS — I480 Paroxysmal atrial fibrillation: Secondary | ICD-10-CM | POA: Insufficient documentation

## 2021-03-25 DIAGNOSIS — M129 Arthropathy, unspecified: Secondary | ICD-10-CM | POA: Insufficient documentation

## 2021-03-25 DIAGNOSIS — Z923 Personal history of irradiation: Secondary | ICD-10-CM | POA: Insufficient documentation

## 2021-03-25 DIAGNOSIS — K219 Gastro-esophageal reflux disease without esophagitis: Secondary | ICD-10-CM | POA: Insufficient documentation

## 2021-03-25 DIAGNOSIS — Z8041 Family history of malignant neoplasm of ovary: Secondary | ICD-10-CM | POA: Diagnosis not present

## 2021-03-25 DIAGNOSIS — D0511 Intraductal carcinoma in situ of right breast: Secondary | ICD-10-CM | POA: Insufficient documentation

## 2021-03-25 DIAGNOSIS — Z8572 Personal history of non-Hodgkin lymphomas: Secondary | ICD-10-CM | POA: Insufficient documentation

## 2021-03-25 DIAGNOSIS — E039 Hypothyroidism, unspecified: Secondary | ICD-10-CM | POA: Insufficient documentation

## 2021-03-25 DIAGNOSIS — Z803 Family history of malignant neoplasm of breast: Secondary | ICD-10-CM | POA: Diagnosis not present

## 2021-03-25 DIAGNOSIS — Z170 Estrogen receptor positive status [ER+]: Secondary | ICD-10-CM | POA: Insufficient documentation

## 2021-03-25 DIAGNOSIS — C8299 Follicular lymphoma, unspecified, extranodal and solid organ sites: Secondary | ICD-10-CM

## 2021-03-25 NOTE — Progress Notes (Signed)
one [place] NOTE  Patient Care Team: Marinda Elk, MD as PCP - General (Physician Assistant) Minna Merritts, MD as PCP - Cardiology (Cardiology) Minna Merritts, MD as Consulting Physician (Cardiology) Rico Junker, RN as Oncology Nurse Navigator  CHIEF COMPLAINTS/PURPOSE OF CONSULTATION: Breast cancer  #  Oncology History Overview Note  A. BREAST WITH CALCIFICATIONS, RIGHT LOWER INNER; STEREOTACTIC BIOPSY:  - HIGH-GRADE DUCTAL CARCINOMA IN SITU (DCIS), COMEDO TYPE.  - LOBULAR NEOPLASIA.  - CALCIFICATIONS ASSOCIATED WITH DCIS AND COLUMNAR CELL CHANGE.  DIAGNOSIS:  A.  BREAST, RIGHT LOWER INNER QUADRANT; LUMPECTOMY:  - HIGH-GRADE DUCTAL CARCINOMA IN SITU (DCIS), COMEDO TYPE.  - PLEOMORPHIC LOBULAR CARCINOMA IN SITU (PLCIS).  - SEE CANCER SUMMARY BELOW.  - LOBULAR NEOPLASIA.  - PRIOR BIOPSY SITE WITH HEMATOMA AND X CLIP.  - SKIN WITH CHANGES CONSISTENT WITH PRIOR BIOPSY  - NON-NEOPLASTIC BREAST WITH INTRADUCTAL PAPILLOMA, CYSTIC APOCRINE  METAPLASIA, AND VASCULAR CALCIFICATION.   # RIGHT LE NON-HODGKIN- FOLLICULAR LYMPHOMA [surgery- at [facility] s/p RT-Dr.Crystal] 2018.    Ductal carcinoma in situ (DCIS) of right breast  03/05/2021 Initial Diagnosis   Ductal carcinoma in situ (DCIS) of right breast   03/05/2021 Cancer Staging   Staging form: Breast, AJCC 8th Edition - Clinical: Stage 0 (cTis (DCIS), cN0, cM0) - Signed by Cammie Sickle, MD on 03/05/2021 Stage prefix: Initial diagnosis      HISTORY OF PRESENTING ILLNESS:  Barbara Mooney 87 y.o.  female patient with recently diagnosed DCIS is here here to review the treatment plan.  Patient underwent lumpectomy.  Post lumpectomy she is recovering well.   She has just met with radiation oncology this afternoon.  Review of Systems  Constitutional: Negative for chills, diaphoresis, fever, malaise/fatigue and weight loss.  HENT: Negative for nosebleeds and sore throat.   Eyes:  Negative for double vision.  Respiratory: Negative for cough, hemoptysis, sputum production, shortness of breath and wheezing.   Cardiovascular: Negative for chest pain, palpitations, orthopnea and leg swelling.  Gastrointestinal: Negative for abdominal pain, blood in stool, constipation, diarrhea, heartburn, melena, nausea and vomiting.  Genitourinary: Negative for dysuria, frequency and urgency.  Musculoskeletal: Positive for back pain. Negative for joint pain.  Skin: Negative.  Negative for itching and rash.  Neurological: Negative for dizziness, tingling, focal weakness, weakness and headaches.  Endo/Heme/Allergies: Does not bruise/bleed easily.  Psychiatric/Behavioral: Negative for depression. The patient is not nervous/anxious and does not have insomnia.      MEDICAL HISTORY:  Past Medical History:  Diagnosis Date  . Arthritis   . Bruises easily   . Current use of long term anticoagulation    Apixaban  . Diabetes mellitus   . Edema    FEET/ LEGS  . GERD (gastroesophageal reflux disease)   . HLD (hyperlipidemia)   . Hypertension   . Hypothyroidism   . Lymphoma ([place]) 12/2017   RADIATION RIGHT LEG   . MRSA infection    remote history; posterior torso  . Multiple thyroid nodules   . Paroxysmal atrial fibrillation (H[facility])   . Personal history of radiation therapy 12/2017   lymphoma    SURGICAL HISTORY: Past Surgical History:  Procedure Laterality Date  . BONE BIOPSY     2018  . BREAST BIOPSY Right 02/26/2021   ffirm bx-"X" clip-path pending  . BREAST LUMPECTOMY Right 03/09/2021   Procedure: BREAST LUMPECTOMY;  Surgeon: Robert Bellow, MD;  Location: [facility] ORS;  Service: General;  Laterality: Right;  . CARDIOVERSION N/A 01/20/2017  Procedure: CARDIOVERSION;  Surgeon: Minna Merritts, MD;  Location: [facility] ORS;  Service: Cardiovascular;  Laterality: N/A;  . CARDIOVERSION     AFIB 2018  . CATARACT EXTRACTION W/PHACO Right 02/15/2018   Procedure: CATARACT EXTRACTION PHACO  AND INTRAOCULAR LENS PLACEMENT (IOC);  Surgeon: Birder Robson, MD;  Location: [facility] ORS;  Service: Ophthalmology;  Laterality: Right;  [place] 01:22.8 AP% 18.9 CDE 15.62 Fluid Pack Lot # [id number]  . CATARACT EXTRACTION W/PHACO Left 03/08/2018   Procedure: CATARACT EXTRACTION PHACO AND INTRAOCULAR LENS PLACEMENT ([place]);  Surgeon: Birder Robson, MD;  Location: [facility] ORS;  Service: Ophthalmology;  Laterality: Left;  [place] 01:05 AP% 15.8 CDE 10.28 Fluid pak lot # [id number]  . COLONOSCOPY    . EYE SURGERY      SOCIAL HISTORY: Social History   Socioeconomic History  . Marital status: Widowed    Spouse name: Not on file  . Number of children: Not on file  . Years of education: Not on file  . Highest education level: Not on file  Occupational History  . Not on file  Tobacco Use  . Smoking status: Never Smoker  . Smokeless tobacco: Never Used  Substance and Sexual Activity  . Alcohol use: No  . Drug use: No  . Sexual activity: Not on file  Other Topics Concern  . Not on file  Social History Narrative   Lives by self; son lives about 2 miles. Never smoked; no alcohol. Used to [employer].    Social Determinants of Health   Financial Resource Strain: Not on file  Food Insecurity: Not on file  Transportation Needs: Not on file  Physical Activity: Not on file  Stress: Not on file  Social Connections: Not on file  Intimate Partner Violence: Not on file    FAMILY HISTORY: Family History  Problem Relation Age of Onset  . Breast cancer Sister 74  . Heart disease Brother   . Ovarian cancer Maternal Aunt        another aunt- colon cancer  . Stroke Sister   . Arrhythmia Sister        A-Fib  . Kidney cancer Neg Hx   . Bladder Cancer Neg Hx     ALLERGIES:  is allergic to nitrofurantoin.  MEDICATIONS:  Current Outpatient Medications  Medication Sig Dispense Refill  . acetaminophen (TYLENOL) 500 MG tablet Take 500 mg by mouth at bedtime.    Marland Kitchen apixaban (ELIQUIS) 5 MG TABS tablet Take  1 tablet (5 mg total) by mouth 2 (two) times daily. 180 tablet 1  . CRANBERRY PO Take 1 capsule by mouth 2 (two) times daily.     . cyanocobalamin (,VITAMIN B-12,) 1000 MCG/ML injection Inject 1,000 mcg into the muscle every 30 (thirty) days.     Marland Kitchen diltiazem (CARDIZEM CD) 180 MG 24 hr capsule TAKE 1 CAPSULE BY MOUTH TWICE A DAY (Patient taking differently: Take 180 mg by mouth 2 (two) times daily.) 180 capsule 0  . furosemide (LASIX) 20 MG tablet TAKE 1 TABLET (20 MG TOTAL) BY MOUTH DAILY AS NEEDED FOR FLUID OR EDEMA (SOB). 90 tablet 0  . HYDROcodone-acetaminophen (NORCO/VICODIN) 5-325 MG tablet Take 1 tablet by mouth every 4 (four) hours as needed for moderate pain. 12 tablet 0  . metFORMIN (GLUCOPHAGE) 500 MG tablet Take 500 mg by mouth 2 (two) times daily with a meal.    . olmesartan (BENICAR) 40 MG tablet Take 1 tablet (40 mg total) by mouth daily. 90 tablet 1  . omeprazole (PRILOSEC)  20 MG capsule Take 1 capsule (20 mg total) by mouth daily. 30 capsule 6  . Trolamine Salicylate (BLUE-EMU HEMP EX) Apply 1 application topically daily as needed (Knee pain).     No current facility-administered medications for this visit.      Marland Kitchen  PHYSICAL EXAMINATION: ECOG PERFORMANCE STATUS: 0 - Asymptomatic  Vitals:   03/25/21 1513  BP: (!) 150/59  Pulse: 79  Resp: 18  Temp: (!) 97.5 F (36.4 C)   Filed Weights   03/25/21 1513  Weight: 179 lb 12.8 oz (81.6 kg)    Physical Exam Constitutional:      Appearance: Normal appearance.     Comments: Walk independently.;  Accompanied by daughter.   HENT:     Head: Normocephalic and atraumatic.     Mouth/Throat:     Pharynx: No oropharyngeal exudate.  Eyes:     Pupils: Pupils are equal, round, and reactive to light.  Cardiovascular:     Rate and Rhythm: Normal rate. Rhythm irregular.  Pulmonary:     Effort: Pulmonary effort is normal. No respiratory distress.     Breath sounds: Normal breath sounds. No wheezing.  Abdominal:     General:  Bowel sounds are normal. There is no distension.     Palpations: Abdomen is soft. There is no mass.     Tenderness: There is no abdominal tenderness. There is no guarding or rebound.  Musculoskeletal:        General: No tenderness. Normal range of motion.     Cervical back: Normal range of motion and neck supple.  Skin:    General: Skin is warm.  Neurological:     Mental Status: She is alert and oriented to person, place, and time.  Psychiatric:        Mood and Affect: Affect normal.      LABORATORY DATA:  I have reviewed the data as listed Lab Results  Component Value Date   WBC 6.2 01/11/2018   HGB 12.4 01/11/2018   HCT 38.1 01/11/2018   MCV 88.5 01/11/2018   PLT 321 01/11/2018   Recent Labs    07/23/20 1418 07/25/20 0832  NA 127* 128*  K 5.8* 4.8  CL 93* 94*  CO2 20 24  GLUCOSE 113* 93  BUN 25 28*  CREATININE 1.10* 1.27*  CALCIUM 9.7 9.5  GFRNONAA 46* 39*  GFRAA 53* 45*    RADIOGRAPHIC STUDIES: I have personally reviewed the radiological images as listed and agreed with the findings in the report. MM Breast Surgical Specimen  Result Date: 03/09/2021 CLINICAL DATA:  Status post surgery of right breast. EXAM: SPECIMEN RADIOGRAPH OF THE right BREAST COMPARISON:  Previous exam(s). FINDINGS: Status post excision of the right breast. X biopsy clip is present and are marked for pathology. IMPRESSION: Specimen radiograph of the right breast. Electronically Signed   By: Abelardo Diesel M.D.   On: 03/09/2021 12:53   MM CLIP PLACEMENT RIGHT  Result Date: 02/26/2021 CLINICAL DATA:  Post procedure mammogram for clip placement. EXAM: DIAGNOSTIC RIGHT MAMMOGRAM POST STEREOTACTIC BIOPSY COMPARISON:  Previous exam(s). FINDINGS: Mammographic images were obtained following stereotactic guided biopsy of calcifications in the lower inner right breast. The biopsy marking clip is in expected position at the site of biopsy. IMPRESSION: Appropriate positioning of the X shaped biopsy marking  clip at the site of biopsy in the lower inner right breast. Final Assessment: Post Procedure Mammograms for Marker Placement Electronically Signed   By: Audie Pinto M.D.   On:  02/26/2021 13:57   MM RT BREAST BX W LOC DEV 1ST LESION IMAGE BX SPEC STEREO GUIDE  Addendum Date: 02/27/2021   ADDENDUM REPORT: 02/27/2021 16:00 ADDENDUM: Pathology revealed HIGH-GRADE DUCTAL CARCINOMA IN SITU (DCIS), COMEDO TYPE, LOBULAR NEOPLASIA. CALCIFICATIONS ASSOCIATED WITH DCIS AND COLUMNAR CELL CHANGE of the RIGHT breast, lower inner quadrant. This was found to be concordant by Dr. Audie Pinto. Pathology results were discussed with the patient and her daughter [name]) by telephone. The patient reported doing well after the biopsy with tenderness at the site. Post biopsy instructions and care were reviewed and questions were answered. The patient was encouraged to call [facility] for any additional concerns. Consider Breast MRI given high grade histology. Reports and recommendation of surgical/oncology referral sent to Oncology Nurse Navigators Tanya Nones RN and Al Pimple RN of [place] message. Pathology results reported by Stacie Acres RN on 02/27/2021. Electronically Signed   By: Audie Pinto M.D.   On: 02/27/2021 16:00   Result Date: 02/27/2021 CLINICAL DATA:  87 year old female presenting for biopsy of right breast calcifications. EXAM: RIGHT BREAST STEREOTACTIC CORE NEEDLE BIOPSY COMPARISON:  Previous exams. FINDINGS: The patient and I discussed the procedure of stereotactic-guided biopsy including benefits and alternatives. We discussed the high likelihood of a successful procedure. We discussed the risks of the procedure including infection, bleeding, tissue injury, clip migration, and inadequate sampling. Informed written consent was given. The usual time out protocol was performed immediately prior to the  procedure. Using sterile technique and 1% Lidocaine as local anesthetic, under stereotactic guidance, a 9 gauge vacuum assisted device was used to perform core needle biopsy of calcifications in the lower inner right breast using a medial approach. Specimen radiograph was performed showing at least 2 specimens with calcifications. Specimens with calcifications are identified for pathology. Lesion quadrant: Lower inner quadrant At the conclusion of the procedure, an X tissue marker clip was deployed into the biopsy cavity. Follow-up 2-view mammogram was performed and dictated separately. IMPRESSION: Stereotactic-guided biopsy of calcifications in the lower inner right breast. No apparent complications. Electronically Signed: By: Audie Pinto M.D. On: 02/26/2021 13:58    ASSESSMENT & PLAN:   Ductal carcinoma in situ (DCIS) of right breast #Right breast DCIS; with comedonecrosis- close but negative margins; discussed with patient risk of recurrence 15 to 20% without radiation.  However with radiation could cut on the risk to 5-10%.  Patient's case was also discussed with tumor conference.  Discussed the role of endocrine therapy-to help prevent development of ipsilateral or contralateral breast malignancy.  However given her age; comorbidities I think endocrine therapy is optional.  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  # A.fib: On Eliquis stable.  # Genetics: Again reviewed with the patient t and family regarding potential etiologies of breast cancer including genetics.  Patient does have a family history of breast cancer; although not very strong.  Discussed regarding evaluation with genetic counseling.  Daughter reluctantly agrees to genetic evaluation; will consider at next visit/ordered.  # DISPOSITION: # follow up in 3 months- MD; no labs- # genetics referral in 3 month [coordinate MD appt- re: breast cnacer ]-  Dr.B    All questions were answered. The patient/family knows to call the clinic with any problems, questions or concerns.       Cammie Sickle, MD 03/26/2021 8:18 AM

## 2021-03-25 NOTE — Progress Notes (Signed)
Pt seen in radiation oncology today.  States vital signs and medications reviewed. Pt saw Dr Donella Stade as well.

## 2021-03-25 NOTE — Assessment & Plan Note (Addendum)
#  Right breast DCIS; with comedonecrosis- close but negative margins; discussed with patient risk of recurrence 15 to 20% without radiation.  However with radiation could cut on the risk to 5-10%.  Patient's case was also discussed with tumor conference.  Discussed the role of endocrine therapy-to help prevent development of ipsilateral or contralateral breast malignancy.  However given her age; comorbidities I think endocrine therapy is optional.  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  # A.fib: On Eliquis stable.  # Genetics: Again reviewed with the patient t and family regarding potential etiologies of breast cancer including genetics.  Patient does have a family history of breast cancer; although not very strong.  Discussed regarding evaluation with genetic counseling.  Daughter reluctantly agrees to genetic evaluation; will consider at next visit/ordered.  # DISPOSITION: # follow up in 3 months- MD; no labs- # genetics referral in 3 month [coordinate MD appt- re: breast cnacer ]- Dr.B

## 2021-03-25 NOTE — Consult Note (Signed)
NEW PATIENT EVALUATION  Name: Barbara Mooney  MRN: [id number]  Date:   03/25/2021     DOB: 29-Oct-1934   This 87 y.o. female patient presents to the clinic for initial evaluation of stage 0 (Tis N0 M0) high-grade ductal carcinoma in situ of the right breast status post wide local excision.  ER positive  REFERRING PHYSICIAN: Marinda Elk, MD  CHIEF COMPLAINT:  Chief Complaint  Patient presents with  . Consult    DIAGNOSIS: The encounter diagnosis was Follicular non-Hodgkin's lymphoma of bone ([place]).   PREVIOUS INVESTIGATIONS:  Mammogram and ultrasound reviewed Surgical pathology report reviewed Clinical note reviewed  HPI: Patient is a 87 year old female previously treated to her left knee for lymphoma back in 2019.  She recently presented with abnormal mammograms of her right breast showing new indeterminate calcifications in the lower inner right quadrant.  She was having some slight tenderness in the spot.  She underwent ultrasound-guided biopsy which was positive for high-grade ductal carcinoma in situ ER positive.  She went on to have a wide local excision showing a 1.8 cm nuclear grade 3 ductal carcinoma in situ with comedonecrosis.  Margins were clear at less than 2 mm.  Margins posterior superior inferior medial lateral with closest margins.  No regional lymph nodes were submitted.  Also present was pleomorphic lobular carcinoma in situ.  Patient tolerated her surgery well.  Is now referred to ration oncology for consideration of adjuvant treatment.  She will be a candidate for antiestrogen therapy.  PLANNED TREATMENT REGIMEN: Hypofractionated right whole breast radiation with boost  PAST MEDICAL HISTORY:  has a past medical history of Arthritis, Bruises easily, Current use of long term anticoagulation, Diabetes mellitus, Edema, GERD (gastroesophageal reflux disease), HLD (hyperlipidemia), Hypertension, Hypothyroidism, Lymphoma ([place]) (12/2017), MRSA infection, Multiple  thyroid nodules, Paroxysmal atrial fibrillation ([place]), and Personal history of radiation therapy (12/2017).    PAST SURGICAL HISTORY:  Past Surgical History:  Procedure Laterality Date  . BONE BIOPSY     2018  . BREAST BIOPSY Right 02/26/2021   ffirm bx-"X" clip-path pending  . BREAST LUMPECTOMY Right 03/09/2021   Procedure: BREAST LUMPECTOMY;  Surgeon: Robert Bellow, MD;  Location: [facility] ORS;  Service: General;  Laterality: Right;  . CARDIOVERSION N/A 01/20/2017   Procedure: CARDIOVERSION;  Surgeon: Minna Merritts, MD;  Location: [facility] ORS;  Service: Cardiovascular;  Laterality: N/A;  . CARDIOVERSION     AFIB 2018  . CATARACT EXTRACTION W/PHACO Right 02/15/2018   Procedure: CATARACT EXTRACTION PHACO AND INTRAOCULAR LENS PLACEMENT (IOC);  Surgeon: Birder Robson, MD;  Location: [facility] ORS;  Service: Ophthalmology;  Laterality: Right;  [place] 01:22.8 AP% 18.9 CDE 15.62 Fluid Pack Lot # [id number]  . CATARACT EXTRACTION W/PHACO Left 03/08/2018   Procedure: CATARACT EXTRACTION PHACO AND INTRAOCULAR LENS PLACEMENT ([place]);  Surgeon: Birder Robson, MD;  Location: [facility] ORS;  Service: Ophthalmology;  Laterality: Left;  [place] 01:05 AP% 15.8 CDE 10.28 Fluid pak lot # [id number]  . COLONOSCOPY    . EYE SURGERY      FAMILY HISTORY: family history includes Arrhythmia in her sister; Breast cancer (age of onset: 43) in her sister; Heart disease in her brother; Ovarian cancer in her maternal aunt; Stroke in her sister.  SOCIAL HISTORY:  reports that she has never smoked. She has never used smokeless tobacco. She reports that she does not drink alcohol and does not use drugs.  ALLERGIES: Nitrofurantoin  MEDICATIONS:  Current Outpatient Medications  Medication Sig Dispense Refill  . acetaminophen (  TYLENOL) 500 MG tablet Take 500 mg by mouth at bedtime.    Marland Kitchen apixaban (ELIQUIS) 5 MG TABS tablet Take 1 tablet (5 mg total) by mouth 2 (two) times daily. 180 tablet 1  . CRANBERRY PO Take 1 capsule by mouth 2  (two) times daily.     . cyanocobalamin (,VITAMIN B-12,) 1000 MCG/ML injection Inject 1,000 mcg into the muscle every 30 (thirty) days.     Marland Kitchen diltiazem (CARDIZEM CD) 180 MG 24 hr capsule TAKE 1 CAPSULE BY MOUTH TWICE A DAY (Patient taking differently: Take 180 mg by mouth 2 (two) times daily.) 180 capsule 0  . furosemide (LASIX) 20 MG tablet TAKE 1 TABLET (20 MG TOTAL) BY MOUTH DAILY AS NEEDED FOR FLUID OR EDEMA (SOB). 90 tablet 0  . HYDROcodone-acetaminophen (NORCO/VICODIN) 5-325 MG tablet Take 1 tablet by mouth every 4 (four) hours as needed for moderate pain. 12 tablet 0  . metFORMIN (GLUCOPHAGE) 500 MG tablet Take 500 mg by mouth 2 (two) times daily with a meal.    . olmesartan (BENICAR) 40 MG tablet Take 1 tablet (40 mg total) by mouth daily. 90 tablet 1  . omeprazole (PRILOSEC) 20 MG capsule Take 1 capsule (20 mg total) by mouth daily. 30 capsule 6  . Trolamine Salicylate (BLUE-EMU HEMP EX) Apply 1 application topically daily as needed (Knee pain).     No current facility-administered medications for this encounter.    ECOG PERFORMANCE STATUS:  0 - Asymptomatic  REVIEW OF SYSTEMS: Patient denies any weight loss, fatigue, weakness, fever, chills or night sweats. Patient denies any loss of vision, blurred vision. Patient denies any ringing  of the ears or hearing loss. No irregular heartbeat. Patient denies heart murmur or history of fainting. Patient denies any chest pain or pain radiating to her upper extremities. Patient denies any shortness of breath, difficulty breathing at night, cough or hemoptysis. Patient denies any swelling in the lower legs. Patient denies any nausea vomiting, vomiting of blood, or coffee ground material in the vomitus. Patient denies any stomach pain. Patient states has had normal bowel movements no significant constipation or diarrhea. Patient denies any dysuria, hematuria or significant nocturia. Patient denies any problems walking, swelling in the joints or loss of  balance. Patient denies any skin changes, loss of hair or loss of weight. Patient denies any excessive worrying or anxiety or significant depression. Patient denies any problems with insomnia. Patient denies excessive thirst, polyuria, polydipsia. Patient denies any swollen glands, patient denies easy bruising or easy bleeding. Patient denies any recent infections, allergies or URI. Patient "s visual fields have not changed significantly in recent time.   PHYSICAL EXAM: BP (!) 150/59   Pulse 79   Temp (!) 97.5 F (36.4 C) (Tympanic)   Wt 179 lb 8 oz (81.4 kg)   BMI 29.42 kg/m  She status post wide local excision and cyst of the right breast with incision healing well.  No dominant masses noted in either breast.  No axillary or supraclavicular adenopathy is identified.  Well-developed well-nourished patient in NAD. HEENT reveals PERLA, EOMI, discs not visualized.  Oral cavity is clear. No oral mucosal lesions are identified. Neck is clear without evidence of cervical or supraclavicular adenopathy. Lungs are clear to A&P. Cardiac examination is essentially unremarkable with regular rate and rhythm without murmur rub or thrill. Abdomen is benign with no organomegaly or masses noted. Motor sensory and DTR levels are equal and symmetric in the upper and lower extremities. Cranial nerves II through  XII are grossly intact. Proprioception is intact. No peripheral adenopathy or edema is identified. No motor or sensory levels are noted. Crude visual fields are within normal range.  LABORATORY DATA: Pathology report reviewed    RADIOLOGY RESULTS: Mammogram and ultrasound reviewed compatible with above-stated findings   IMPRESSION: Stage 0 high-grade ductal carcinoma site to the right breast status post wide local excision in 87 year old female with close margins ER positive  PLAN: This time elect to go ahead with a hypofractionated course of radiation therapy over 3 weeks to her right breast would also  boost her scar another 1600 centigrade based on the close margins.  Her breast is rather small and with the pleomorphic lobular carcinoma in situ I believe whole breast radiation would be the best avenue rather than accelerated partial breast irradiation.  Risks and benefits of treatment occluding skin reaction fatigue alteration of blood counts possible inclusion of superficial lung all were discussed in detail with the patient and her daughter they both seem to comprehend my treatment plan well.  I have personally set up and ordered CT simulation for next week.  Patient also will benefit from antiestrogen therapy after completion of radiation.  I would like to take this opportunity to thank you for allowing me to participate in the care of your patient.Noreene Filbert, MD

## 2021-03-31 ENCOUNTER — Ambulatory Visit
Admission: RE | Admit: 2021-03-31 | Discharge: 2021-03-31 | Disposition: A | Discharge status: Home / Self Care | Payer: Medicare Other | Source: Ambulatory Visit | Attending: Radiation Oncology | Admitting: Radiation Oncology

## 2021-03-31 DIAGNOSIS — D0511 Intraductal carcinoma in situ of right breast: Secondary | ICD-10-CM | POA: Diagnosis not present

## 2021-04-01 DIAGNOSIS — D0511 Intraductal carcinoma in situ of right breast: Secondary | ICD-10-CM | POA: Diagnosis not present

## 2021-04-03 ENCOUNTER — Other Ambulatory Visit: Payer: Self-pay | Admitting: *Deleted

## 2021-04-03 DIAGNOSIS — D0511 Intraductal carcinoma in situ of right breast: Secondary | ICD-10-CM

## 2021-04-09 ENCOUNTER — Ambulatory Visit: Admission: RE | Admit: 2021-04-09 | Payer: Medicare Other | Source: Ambulatory Visit

## 2021-04-09 DIAGNOSIS — D0511 Intraductal carcinoma in situ of right breast: Secondary | ICD-10-CM | POA: Diagnosis not present

## 2021-04-13 ENCOUNTER — Ambulatory Visit
Admission: RE | Admit: 2021-04-13 | Discharge: 2021-04-13 | Disposition: A | Discharge status: Home / Self Care | Payer: Medicare Other | Source: Ambulatory Visit | Attending: Radiation Oncology | Admitting: Radiation Oncology

## 2021-04-13 DIAGNOSIS — D0511 Intraductal carcinoma in situ of right breast: Secondary | ICD-10-CM | POA: Insufficient documentation

## 2021-04-14 ENCOUNTER — Ambulatory Visit
Admission: RE | Admit: 2021-04-14 | Discharge: 2021-04-14 | Disposition: A | Discharge status: Home / Self Care | Payer: Medicare Other | Source: Ambulatory Visit | Attending: Radiation Oncology | Admitting: Radiation Oncology

## 2021-04-14 ENCOUNTER — Encounter: Payer: Medicare Other | Admitting: Licensed Clinical Social Worker

## 2021-04-14 DIAGNOSIS — D0511 Intraductal carcinoma in situ of right breast: Secondary | ICD-10-CM | POA: Diagnosis not present

## 2021-04-15 ENCOUNTER — Ambulatory Visit
Admission: RE | Admit: 2021-04-15 | Discharge: 2021-04-15 | Disposition: A | Discharge status: Home / Self Care | Payer: Medicare Other | Source: Ambulatory Visit | Attending: Radiation Oncology | Admitting: Radiation Oncology

## 2021-04-15 DIAGNOSIS — D0511 Intraductal carcinoma in situ of right breast: Secondary | ICD-10-CM | POA: Diagnosis not present

## 2021-04-16 ENCOUNTER — Ambulatory Visit
Admission: RE | Admit: 2021-04-16 | Discharge: 2021-04-16 | Disposition: A | Discharge status: Home / Self Care | Payer: Medicare Other | Source: Ambulatory Visit | Attending: Radiation Oncology | Admitting: Radiation Oncology

## 2021-04-16 DIAGNOSIS — D0511 Intraductal carcinoma in situ of right breast: Secondary | ICD-10-CM | POA: Diagnosis not present

## 2021-04-17 ENCOUNTER — Ambulatory Visit
Admission: RE | Admit: 2021-04-17 | Discharge: 2021-04-17 | Disposition: A | Discharge status: Home / Self Care | Payer: Medicare Other | Source: Ambulatory Visit | Attending: Radiation Oncology | Admitting: Radiation Oncology

## 2021-04-17 DIAGNOSIS — D0511 Intraductal carcinoma in situ of right breast: Secondary | ICD-10-CM | POA: Diagnosis not present

## 2021-04-20 ENCOUNTER — Ambulatory Visit
Admission: RE | Admit: 2021-04-20 | Discharge: 2021-04-20 | Disposition: A | Discharge status: Home / Self Care | Payer: Medicare Other | Source: Ambulatory Visit | Attending: Radiation Oncology | Admitting: Radiation Oncology

## 2021-04-20 DIAGNOSIS — D0511 Intraductal carcinoma in situ of right breast: Secondary | ICD-10-CM | POA: Diagnosis not present

## 2021-04-21 ENCOUNTER — Ambulatory Visit
Admission: RE | Admit: 2021-04-21 | Discharge: 2021-04-21 | Disposition: A | Discharge status: Home / Self Care | Payer: Medicare Other | Source: Ambulatory Visit | Attending: Radiation Oncology | Admitting: Radiation Oncology

## 2021-04-21 DIAGNOSIS — D0511 Intraductal carcinoma in situ of right breast: Secondary | ICD-10-CM | POA: Diagnosis not present

## 2021-04-22 ENCOUNTER — Ambulatory Visit
Admission: RE | Admit: 2021-04-22 | Discharge: 2021-04-22 | Disposition: A | Discharge status: Home / Self Care | Payer: Medicare Other | Source: Ambulatory Visit | Attending: Radiation Oncology | Admitting: Radiation Oncology

## 2021-04-22 DIAGNOSIS — D0511 Intraductal carcinoma in situ of right breast: Secondary | ICD-10-CM | POA: Diagnosis not present

## 2021-04-23 ENCOUNTER — Inpatient Hospital Stay: Payer: Medicare Other

## 2021-04-23 ENCOUNTER — Ambulatory Visit
Admission: RE | Admit: 2021-04-23 | Discharge: 2021-04-23 | Disposition: A | Discharge status: Home / Self Care | Payer: Medicare Other | Source: Ambulatory Visit | Attending: Radiation Oncology | Admitting: Radiation Oncology

## 2021-04-23 DIAGNOSIS — Z8572 Personal history of non-Hodgkin lymphomas: Secondary | ICD-10-CM | POA: Insufficient documentation

## 2021-04-23 DIAGNOSIS — Z86000 Personal history of in-situ neoplasm of breast: Secondary | ICD-10-CM | POA: Insufficient documentation

## 2021-04-23 DIAGNOSIS — D0511 Intraductal carcinoma in situ of right breast: Secondary | ICD-10-CM | POA: Diagnosis not present

## 2021-04-24 ENCOUNTER — Ambulatory Visit: Payer: Medicare Other

## 2021-04-24 ENCOUNTER — Ambulatory Visit
Admission: RE | Admit: 2021-04-24 | Discharge: 2021-04-24 | Disposition: A | Discharge status: Home / Self Care | Payer: Medicare Other | Source: Ambulatory Visit | Attending: Radiation Oncology | Admitting: Radiation Oncology

## 2021-04-24 DIAGNOSIS — D0511 Intraductal carcinoma in situ of right breast: Secondary | ICD-10-CM | POA: Diagnosis not present

## 2021-04-27 ENCOUNTER — Ambulatory Visit
Admission: RE | Admit: 2021-04-27 | Discharge: 2021-04-27 | Disposition: A | Discharge status: Home / Self Care | Payer: Medicare Other | Source: Ambulatory Visit | Attending: Radiation Oncology | Admitting: Radiation Oncology

## 2021-04-27 DIAGNOSIS — D0511 Intraductal carcinoma in situ of right breast: Secondary | ICD-10-CM | POA: Diagnosis not present

## 2021-04-28 ENCOUNTER — Ambulatory Visit
Admission: RE | Admit: 2021-04-28 | Discharge: 2021-04-28 | Disposition: A | Discharge status: Home / Self Care | Payer: Medicare Other | Source: Ambulatory Visit | Attending: Radiation Oncology | Admitting: Radiation Oncology

## 2021-04-28 DIAGNOSIS — D0511 Intraductal carcinoma in situ of right breast: Secondary | ICD-10-CM | POA: Diagnosis not present

## 2021-04-29 ENCOUNTER — Ambulatory Visit
Admission: RE | Admit: 2021-04-29 | Discharge: 2021-04-29 | Disposition: A | Discharge status: Home / Self Care | Payer: Medicare Other | Source: Ambulatory Visit | Attending: Radiation Oncology | Admitting: Radiation Oncology

## 2021-04-29 DIAGNOSIS — D0511 Intraductal carcinoma in situ of right breast: Secondary | ICD-10-CM | POA: Diagnosis not present

## 2021-04-30 ENCOUNTER — Ambulatory Visit
Admission: RE | Admit: 2021-04-30 | Discharge: 2021-04-30 | Disposition: A | Discharge status: Home / Self Care | Payer: Medicare Other | Source: Ambulatory Visit | Attending: Radiation Oncology | Admitting: Radiation Oncology

## 2021-04-30 DIAGNOSIS — D0511 Intraductal carcinoma in situ of right breast: Secondary | ICD-10-CM | POA: Diagnosis not present

## 2021-05-01 ENCOUNTER — Ambulatory Visit
Admission: RE | Admit: 2021-05-01 | Discharge: 2021-05-01 | Disposition: A | Discharge status: Home / Self Care | Payer: Medicare Other | Source: Ambulatory Visit | Attending: Radiation Oncology | Admitting: Radiation Oncology

## 2021-05-01 DIAGNOSIS — D0511 Intraductal carcinoma in situ of right breast: Secondary | ICD-10-CM | POA: Diagnosis not present

## 2021-05-04 ENCOUNTER — Ambulatory Visit
Admission: RE | Admit: 2021-05-04 | Discharge: 2021-05-04 | Disposition: A | Discharge status: Home / Self Care | Payer: Medicare Other | Source: Ambulatory Visit | Attending: Radiation Oncology | Admitting: Radiation Oncology

## 2021-05-04 DIAGNOSIS — D0511 Intraductal carcinoma in situ of right breast: Secondary | ICD-10-CM | POA: Diagnosis not present

## 2021-05-05 ENCOUNTER — Ambulatory Visit
Admission: RE | Admit: 2021-05-05 | Discharge: 2021-05-05 | Disposition: A | Discharge status: Home / Self Care | Payer: Medicare Other | Source: Ambulatory Visit | Attending: Radiation Oncology | Admitting: Radiation Oncology

## 2021-05-05 DIAGNOSIS — D0511 Intraductal carcinoma in situ of right breast: Secondary | ICD-10-CM | POA: Diagnosis not present

## 2021-05-06 ENCOUNTER — Ambulatory Visit
Admission: RE | Admit: 2021-05-06 | Discharge: 2021-05-06 | Disposition: A | Discharge status: Home / Self Care | Payer: Medicare Other | Source: Ambulatory Visit | Attending: Radiation Oncology | Admitting: Radiation Oncology

## 2021-05-06 DIAGNOSIS — D0511 Intraductal carcinoma in situ of right breast: Secondary | ICD-10-CM | POA: Diagnosis not present

## 2021-05-07 ENCOUNTER — Inpatient Hospital Stay: Payer: Medicare Other

## 2021-05-07 ENCOUNTER — Ambulatory Visit
Admission: RE | Admit: 2021-05-07 | Discharge: 2021-05-07 | Disposition: A | Discharge status: Home / Self Care | Payer: Medicare Other | Source: Ambulatory Visit | Attending: Radiation Oncology | Admitting: Radiation Oncology

## 2021-05-07 DIAGNOSIS — D0511 Intraductal carcinoma in situ of right breast: Secondary | ICD-10-CM

## 2021-05-07 LAB — CBC
HCT: 41.5 % (ref 36.0–46.0)
Hemoglobin: 13.4 g/dL (ref 12.0–15.0)
MCH: 28.9 pg (ref 26.0–34.0)
MCHC: 32.3 g/dL (ref 30.0–36.0)
MCV: 89.6 fL (ref 80.0–100.0)
Platelets: 251 10*3/uL (ref 150–400)
RBC: 4.63 MIL/uL (ref 3.87–5.11)
RDW: 14.2 % (ref 11.5–15.5)
WBC: 5.6 10*3/uL (ref 4.0–10.5)
nRBC: 0 % (ref 0.0–0.2)

## 2021-05-08 ENCOUNTER — Ambulatory Visit
Admission: RE | Admit: 2021-05-08 | Discharge: 2021-05-08 | Disposition: A | Discharge status: Home / Self Care | Payer: Medicare Other | Source: Ambulatory Visit | Attending: Radiation Oncology | Admitting: Radiation Oncology

## 2021-05-08 DIAGNOSIS — D0511 Intraductal carcinoma in situ of right breast: Secondary | ICD-10-CM | POA: Diagnosis not present

## 2021-05-12 ENCOUNTER — Ambulatory Visit
Admission: RE | Admit: 2021-05-12 | Discharge: 2021-05-12 | Disposition: A | Discharge status: Home / Self Care | Payer: Medicare Other | Source: Ambulatory Visit | Attending: Radiation Oncology | Admitting: Radiation Oncology

## 2021-05-12 DIAGNOSIS — D0511 Intraductal carcinoma in situ of right breast: Secondary | ICD-10-CM | POA: Diagnosis not present

## 2021-05-13 ENCOUNTER — Ambulatory Visit
Admission: RE | Admit: 2021-05-13 | Discharge: 2021-05-13 | Disposition: A | Discharge status: Home / Self Care | Payer: Medicare Other | Source: Ambulatory Visit | Attending: Radiation Oncology | Admitting: Radiation Oncology

## 2021-05-13 DIAGNOSIS — D0511 Intraductal carcinoma in situ of right breast: Secondary | ICD-10-CM | POA: Insufficient documentation

## 2021-05-14 ENCOUNTER — Ambulatory Visit
Admission: RE | Admit: 2021-05-14 | Discharge: 2021-05-14 | Disposition: A | Discharge status: Home / Self Care | Payer: Medicare Other | Source: Ambulatory Visit | Attending: Radiation Oncology | Admitting: Radiation Oncology

## 2021-05-14 DIAGNOSIS — D0511 Intraductal carcinoma in situ of right breast: Secondary | ICD-10-CM | POA: Diagnosis not present

## 2021-05-15 ENCOUNTER — Ambulatory Visit
Admission: RE | Admit: 2021-05-15 | Discharge: 2021-05-15 | Disposition: A | Discharge status: Home / Self Care | Payer: Medicare Other | Source: Ambulatory Visit | Attending: Radiation Oncology | Admitting: Radiation Oncology

## 2021-05-15 DIAGNOSIS — D0511 Intraductal carcinoma in situ of right breast: Secondary | ICD-10-CM | POA: Diagnosis not present

## 2021-05-18 ENCOUNTER — Other Ambulatory Visit: Payer: Self-pay | Admitting: Cardiovascular Disease

## 2021-05-19 ENCOUNTER — Telehealth: Payer: Self-pay | Admitting: Internal Medicine

## 2021-05-19 NOTE — Telephone Encounter (Signed)
Returned phone call with patient's daughter [name]). She requested to reschedule appt on 7/13.

## 2021-06-11 DIAGNOSIS — M6530 Trigger finger, unspecified finger: Secondary | ICD-10-CM | POA: Insufficient documentation

## 2021-06-19 ENCOUNTER — Encounter: Payer: Self-pay | Admitting: Internal Medicine

## 2021-06-19 ENCOUNTER — Ambulatory Visit
Admission: RE | Admit: 2021-06-19 | Discharge: 2021-06-19 | Disposition: A | Discharge status: Home / Self Care | Payer: Medicare Other | Source: Ambulatory Visit | Attending: Radiation Oncology | Admitting: Radiation Oncology

## 2021-06-19 ENCOUNTER — Other Ambulatory Visit: Payer: Self-pay

## 2021-06-19 ENCOUNTER — Inpatient Hospital Stay: Payer: Medicare Other | Attending: Internal Medicine | Admitting: Internal Medicine

## 2021-06-19 DIAGNOSIS — D0511 Intraductal carcinoma in situ of right breast: Secondary | ICD-10-CM | POA: Diagnosis present

## 2021-06-19 DIAGNOSIS — Z923 Personal history of irradiation: Secondary | ICD-10-CM | POA: Insufficient documentation

## 2021-06-19 NOTE — Assessment & Plan Note (Addendum)
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   # Again discussed the role of endocrine therapy-to help prevent development of ipsilateral or contralateral breast malignancy.  However given her age; comorbidities [history of arthritis/contributing to gait instability-see below] I think endocrine therapy is optional.   #Arthritis/gait instability-on anticoagulation-currently concerns for worsening arthritis which could lead potentially to gait instability and fall given her age I think is reasonable to hold off any prophylactic aromatase inhibitor therapy..  After lengthy discussion was decided not to proceed with aromatase inhibitor.  However patient and daughter will call us back if they are interested in initiation of aromatase inhibitor therapy.  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  # 2020-BMD- Osteopenia. Lumbar spine not reported due to scoliosis  or sclerosis.[defer to PCP]  # A.fib: On Eliquis stable.  # Genetics: Again reviewed with the patient t and family regarding potential etiologies of breast cancer including genetics.  Patient does have a family history of breast cancer; although not very strong.  Discussed regarding evaluation with genetic counseling- awaiting on 07/2021.   # DISPOSITION: # follow up as needed- Dr.B

## 2021-06-19 NOTE — Progress Notes (Signed)
Radiation Oncology Follow up Note  Name: Barbara Mooney   Date:   06/19/2021 MRN:  [id number] DOB: 08/30/1934    This 87 y.o. 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  She is discussed tamoxifen therapy with medical oncology and she is contemplating that now.  She also does have a diagnosis of follicular non-Hodgkin's lymphoma of bone..  COMPLICATIONS OF TREATMENT: none  FOLLOW UP COMPLIANCE: keeps appointments   PHYSICAL EXAM:  There were no vitals taken for this visit. She has marked deformity of the right breast secondary to surgery.  No dominant masses noted in either breast no axillary supraclavicular adenopathy is appreciated.  Well-developed well-nourished patient in NAD. HEENT reveals PERLA, EOMI, discs not visualized.  Oral cavity is clear. No oral mucosal lesions are identified. Neck is clear without evidence of cervical or supraclavicular adenopathy. Lungs are clear to A&P. Cardiac examination is essentially unremarkable with regular rate and rhythm without murmur rub or thrill. Abdomen is benign with no organomegaly or masses noted. Motor sensory and DTR levels are equal and symmetric in the upper and lower extremities. Cranial nerves II through XII are grossly intact. Proprioception is intact. No peripheral adenopathy or edema is identified. No motor or sensory levels are noted. Crude visual fields are within normal range.  RADIOLOGY RESULTS: No current films for review  PLAN: Present time patient is 1 month out from whole breast radiation  doing extremely well with very low side effect profile.  Improved pleased with her overall progress.  I have asked to see her out in 4 to 5 months for follow-up.  She will be reviewing with her family tamoxifen therapy whether to accept that or not.  Patient knows to call with any concerns.  I would like to take this opportunity to thank you for allowing me to participate in the care of your patient.Noreene Filbert, MD

## 2021-06-19 NOTE — Progress Notes (Signed)
[facility] [place] NOTE  Patient Care Team: Marinda Elk, MD as PCP - General (Physician Assistant) Minna Merritts, MD as PCP - Cardiology (Cardiology) Minna Merritts, MD as Consulting Physician (Cardiology) Rico Junker, RN as Oncology Nurse Navigator  CHIEF COMPLAINTS/PURPOSE OF CONSULTATION: Breast cancer  #  Oncology History Overview Note  A. BREAST WITH CALCIFICATIONS, RIGHT LOWER INNER; STEREOTACTIC BIOPSY:  - HIGH-GRADE DUCTAL CARCINOMA IN SITU (DCIS), COMEDO TYPE.  - LOBULAR NEOPLASIA.  - CALCIFICATIONS ASSOCIATED WITH DCIS AND COLUMNAR CELL CHANGE.  DIAGNOSIS:  A.  BREAST, RIGHT LOWER INNER QUADRANT; LUMPECTOMY:  - HIGH-GRADE DUCTAL CARCINOMA IN SITU (DCIS), COMEDO TYPE.  - PLEOMORPHIC LOBULAR CARCINOMA IN SITU (PLCIS).  - SEE CANCER SUMMARY BELOW.  - LOBULAR NEOPLASIA.  - PRIOR BIOPSY SITE WITH HEMATOMA AND X CLIP.  - SKIN WITH CHANGES CONSISTENT WITH PRIOR BIOPSY  - NON-NEOPLASTIC BREAST WITH INTRADUCTAL PAPILLOMA, CYSTIC APOCRINE  METAPLASIA, AND VASCULAR CALCIFICATION.   # S/p hypofractionated course of radiation therapy over 3 weeks to her right breast would also boost her scar another 1600 centigrade based on the close margins.   # RIGHT LE NON-HODGKIN- FOLLICULAR LYMPHOMA [surgery- at [facility] s/p RT-Dr.Crystal] 2018.    Ductal carcinoma in situ (DCIS) of right breast  03/05/2021 Initial Diagnosis   Ductal carcinoma in situ (DCIS) of right breast   03/05/2021 Cancer Staging   Staging form: Breast, AJCC 8th Edition - Clinical: Stage 0 (cTis (DCIS), cN0, cM0) - Signed by Cammie Sickle, MD on 03/05/2021  Stage prefix: Initial diagnosis       HISTORY OF PRESENTING ILLNESS:  Barbara Mooney 87 y.o.  female patient ER positive DCIS status postlumpectomy followed by radiation is here for follow-up/to discuss prophylactic aromatase inhibitor.  Patient tolerated radiation fairly well.  Denies any significant skin rash or  radiation burn.   Review of Systems  Constitutional:  Negative for chills, diaphoresis, fever, malaise/fatigue and weight loss.  HENT:  Negative for nosebleeds and sore throat.   Eyes:  Negative for double vision.  Respiratory:  Negative for cough, hemoptysis, sputum production, shortness of breath and wheezing.   Cardiovascular:  Negative for chest pain, palpitations, orthopnea and leg swelling.  Gastrointestinal:  Negative for abdominal pain, blood in stool, constipation, diarrhea, heartburn, melena, nausea and vomiting.  Genitourinary:  Negative for dysuria, frequency and urgency.  Musculoskeletal:  Positive for back pain and joint pain.  Skin: Negative.  Negative for itching and rash.  Neurological:  Negative for dizziness, tingling, focal weakness, weakness and headaches.  Endo/Heme/Allergies:  Does not bruise/bleed easily.  Psychiatric/Behavioral:  Negative for depression. The patient is not nervous/anxious and does not have insomnia.     MEDICAL HISTORY:  Past Medical History:  Diagnosis Date   Arthritis    Bruises easily    Current use of long term anticoagulation    Apixaban   Diabetes mellitus    Edema    FEET/ LEGS   GERD (gastroesophageal reflux disease)    HLD (hyperlipidemia)    Hypertension    Hypothyroidism    Lymphoma ([place]) 12/2017   RADIATION RIGHT LEG    MRSA infection    remote history; posterior torso   Multiple thyroid nodules    Paroxysmal atrial fibrillation ([place])    Personal history of radiation therapy 12/2017   lymphoma    SURGICAL HISTORY: Past Surgical History:  Procedure Laterality Date   BONE BIOPSY     2018   BREAST BIOPSY Right 02/26/2021  ffirm bx-"X" clip-path pending   BREAST LUMPECTOMY Right 03/09/2021   Procedure: BREAST LUMPECTOMY;  Surgeon: Robert Bellow, MD;  Location: [facility] ORS;  Service: General;  Laterality: Right;   CARDIOVERSION N/A 01/20/2017   Procedure: CARDIOVERSION;  Surgeon: Minna Merritts, MD;  Location: [facility]  ORS;  Service: Cardiovascular;  Laterality: N/A;   CARDIOVERSION     AFIB 2018   CATARACT EXTRACTION W/PHACO Right 02/15/2018   Procedure: CATARACT EXTRACTION PHACO AND INTRAOCULAR LENS PLACEMENT ([place]);  Surgeon: Birder Robson, MD;  Location: [facility] ORS;  Service: Ophthalmology;  Laterality: Right;  [place] 01:22.8 AP% 18.9 CDE 15.62 Fluid Pack Lot # [id number]   CATARACT EXTRACTION W/PHACO Left 03/08/2018   Procedure: CATARACT EXTRACTION PHACO AND INTRAOCULAR LENS PLACEMENT ([facility]);  Surgeon: Birder Robson, MD;  Location: [facility] ORS;  Service: Ophthalmology;  Laterality: Left;  [place] 01:05 AP% 15.8 CDE 10.28 Fluid pak lot # [id number]   COLONOSCOPY     EYE SURGERY      SOCIAL HISTORY: Social History   Socioeconomic History   Marital status: Widowed    Spouse name: Not on file   Number of children: Not on file   Years of education: Not on file   Highest education level: Not on file  Occupational History   Not on file  Tobacco Use   Smoking status: Never   Smokeless tobacco: Never  Substance and Sexual Activity   Alcohol use: No   Drug use: No   Sexual activity: Not on file  Other Topics Concern   Not on file  Social History Narrative   Lives by self; son lives about 2 miles. Never smoked; no alcohol. Used to [employer].    Social Determinants of Health   Financial Resource Strain: Not on file  Food Insecurity: Not on file  Transportation Needs: Not on file  Physical Activity: Not on file  Stress: Not on file  Social Connections: Not on file  Intimate Partner Violence: Not on file    FAMILY HISTORY: Family History  Problem Relation Age of Onset   Breast cancer Sister 48   Heart disease Brother    Ovarian cancer Maternal Aunt        another aunt- colon cancer   Stroke Sister    Arrhythmia Sister        A-Fib   Kidney cancer Neg Hx    Bladder Cancer Neg Hx     ALLERGIES:  is allergic to nitrofurantoin and sulfa antibiotics.  MEDICATIONS:  Current Outpatient  Medications  Medication Sig Dispense Refill   acetaminophen (TYLENOL) 500 MG tablet Take 500 mg by mouth at bedtime.     apixaban (ELIQUIS) 5 MG TABS tablet Take 1 tablet (5 mg total) by mouth 2 (two) times daily. 180 tablet 1   CRANBERRY PO Take 1 capsule by mouth 2 (two) times daily.      cyanocobalamin (,VITAMIN B-12,) 1000 MCG/ML injection Inject 1,000 mcg into the muscle every 30 (thirty) days.      diltiazem (CARDIZEM CD) 180 MG 24 hr capsule TAKE 1 CAPSULE BY MOUTH TWICE A DAY (Patient taking differently: Take 180 mg by mouth 2 (two) times daily.) 180 capsule 0   furosemide (LASIX) 20 MG tablet TAKE 1 TABLET (20 MG TOTAL) BY MOUTH DAILY AS NEEDED FOR FLUID OR EDEMA (SOB). 90 tablet 0   metFORMIN (GLUCOPHAGE) 500 MG tablet Take 500 mg by mouth 2 (two) times daily with a meal.     olmesartan (BENICAR) 40 MG tablet TAKE  1 TABLET BY MOUTH EVERY DAY 90 tablet 0   omeprazole (PRILOSEC) 20 MG capsule Take 1 capsule (20 mg total) by mouth daily. 30 capsule 6   Trolamine Salicylate (BLUE-EMU HEMP EX) Apply 1 application topically daily as needed (Knee pain).     No current facility-administered medications for this visit.      Marland Kitchen  PHYSICAL EXAMINATION: ECOG PERFORMANCE STATUS: 0 - Asymptomatic  Vitals:   06/19/21 0951  BP: (!) 154/76  Pulse: (!) 53  Resp: 18  Temp: 98 F (36.7 C)   Filed Weights   06/19/21 0951  Weight: 176 lb 3.2 oz (79.9 kg)    Physical Exam Constitutional:      Appearance: Normal appearance.     Comments: Walk independently.;  Accompanied by daughter.   HENT:     Head: Normocephalic and atraumatic.     Mouth/Throat:     Pharynx: No oropharyngeal exudate.  Eyes:     Pupils: Pupils are equal, round, and reactive to light.  Cardiovascular:     Rate and Rhythm: Normal rate. Rhythm irregular.  Pulmonary:     Effort: Pulmonary effort is normal. No respiratory distress.     Breath sounds: Normal breath sounds. No wheezing.  Abdominal:     General: Bowel  sounds are normal. There is no distension.     Palpations: Abdomen is soft. There is no mass.     Tenderness: There is no abdominal tenderness. There is no guarding or rebound.  Musculoskeletal:        General: No tenderness. Normal range of motion.     Cervical back: Normal range of motion and neck supple.  Skin:    General: Skin is warm.  Neurological:     Mental Status: She is alert and oriented to person, place, and time.  Psychiatric:        Mood and Affect: Affect normal.     LABORATORY DATA:  I have reviewed the data as listed Lab Results  Component Value Date   WBC 5.6 05/07/2021   HGB 13.4 05/07/2021   HCT 41.5 05/07/2021   MCV 89.6 05/07/2021   PLT 251 05/07/2021   Recent Labs    07/23/20 1418 07/25/20 0832  NA 127* 128*  K 5.8* 4.8  CL 93* 94*  CO2 20 24  GLUCOSE 113* 93  BUN 25 28*  CREATININE 1.10* 1.27*  CALCIUM 9.7 9.5  GFRNONAA 46* 39*  GFRAA 53* 45*    RADIOGRAPHIC STUDIES: I have personally reviewed the radiological images as listed and agreed with the findings in the report. No results found.   ASSESSMENT & PLAN:   Ductal carcinoma in situ (DCIS) of right breast #Right breast DCIS; with comedonecrosis- close but negative margins- s/p Radiation.   # Again discussed the role of endocrine therapy-to help prevent development of ipsilateral or contralateral breast malignancy.  However given her age; comorbidities [history of arthritis/contributing to gait instability-see below] I think endocrine therapy is optional.   #Arthritis/gait instability-on anticoagulation-currently concerns for worsening arthritis which could lead potentially to gait instability and fall given her age I think is reasonable to hold off any prophylactic aromatase inhibitor therapy..  After lengthy discussion was decided not to proceed with aromatase inhibitor.  However patient and daughter will call us back if they are interested in initiation of aromatase inhibitor  therapy.  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  # 2020-BMD- Osteopenia. Lumbar spine not reported due to scoliosis  or sclerosis.[defer to PCP]  # A.fib: On Eliquis stable.  # Genetics: Again reviewed with the patient t and family regarding potential etiologies of breast cancer including genetics.  Patient does have a family history of breast cancer; although not very strong.  Discussed regarding evaluation with genetic counseling- awaiting on 07/2021.   # DISPOSITION: # follow up as needed- Dr.B    All questions were answered. The patient/family knows to call the clinic with any problems, questions or concerns.       Cammie Sickle, MD 06/21/2021 8:32 PM

## 2021-06-24 ENCOUNTER — Ambulatory Visit: Payer: Medicare Other | Admitting: Internal Medicine

## 2021-06-24 ENCOUNTER — Encounter: Payer: Medicare Other | Admitting: Licensed Clinical Social Worker

## 2021-07-21 ENCOUNTER — Inpatient Hospital Stay: Payer: Medicare Other

## 2021-07-21 ENCOUNTER — Encounter: Payer: Self-pay | Admitting: Licensed Clinical Social Worker

## 2021-07-21 ENCOUNTER — Inpatient Hospital Stay: Payer: Medicare Other | Attending: Internal Medicine | Admitting: Licensed Clinical Social Worker

## 2021-07-21 DIAGNOSIS — Z8041 Family history of malignant neoplasm of ovary: Secondary | ICD-10-CM

## 2021-07-21 DIAGNOSIS — Z800 Family history of malignant neoplasm of digestive organs: Secondary | ICD-10-CM

## 2021-07-21 DIAGNOSIS — E782 Mixed hyperlipidemia: Secondary | ICD-10-CM | POA: Insufficient documentation

## 2021-07-21 DIAGNOSIS — M8580 Other specified disorders of bone density and structure, unspecified site: Secondary | ICD-10-CM | POA: Insufficient documentation

## 2021-07-21 DIAGNOSIS — D0511 Intraductal carcinoma in situ of right breast: Secondary | ICD-10-CM | POA: Diagnosis not present

## 2021-07-21 DIAGNOSIS — Z803 Family history of malignant neoplasm of breast: Secondary | ICD-10-CM | POA: Diagnosis not present

## 2021-07-21 DIAGNOSIS — Z807 Family history of other malignant neoplasms of lymphoid, hematopoietic and related tissues: Secondary | ICD-10-CM | POA: Insufficient documentation

## 2021-07-21 NOTE — Progress Notes (Signed)
REFERRING PROVIDER: Cammie Sickle, MD [place],  [place] [address]  PRIMARY PROVIDER:  Marinda Elk, MD  PRIMARY REASON FOR VISIT:  1. Ductal carcinoma in situ (DCIS) of right breast   2. Family history of breast cancer   3. Family history of ovarian cancer   4. Family history of colon cancer   5. Family history of Hodgkin's disease      HISTORY OF PRESENT ILLNESS:   Barbara Mooney, a 87 y.o. female, was seen for a [place] cancer genetics consultation at the request of Dr. Rogue Bussing due to a personal and family history of cancer.  Ms. Willhoite presents to clinic today to discuss the possibility of a hereditary predisposition to cancer, genetic testing, and to further clarify her future cancer risks, as well as potential cancer risks for family members.   In 2022, at the age of 88, Ms. Schiffman was diagnosed with DCIS of the right breast. The treatment plan included lumpectomy and radiation which she has completed. She also had lymphoma at 35 and had radiation for that.   CANCER HISTORY:  Oncology History Overview Note  A. BREAST WITH CALCIFICATIONS, RIGHT LOWER INNER; STEREOTACTIC BIOPSY:  - HIGH-GRADE DUCTAL CARCINOMA IN SITU (DCIS), COMEDO TYPE.  - LOBULAR NEOPLASIA.  - CALCIFICATIONS ASSOCIATED WITH DCIS AND COLUMNAR CELL CHANGE.  DIAGNOSIS:  A.  BREAST, RIGHT LOWER INNER QUADRANT; LUMPECTOMY:  - HIGH-GRADE DUCTAL CARCINOMA IN SITU (DCIS), COMEDO TYPE.  - PLEOMORPHIC LOBULAR CARCINOMA IN SITU (PLCIS).  - SEE CANCER SUMMARY BELOW.  - LOBULAR NEOPLASIA.  - PRIOR BIOPSY SITE WITH HEMATOMA AND X CLIP.  - SKIN WITH CHANGES CONSISTENT WITH PRIOR BIOPSY  - NON-NEOPLASTIC BREAST WITH INTRADUCTAL PAPILLOMA, CYSTIC APOCRINE  METAPLASIA, AND VASCULAR CALCIFICATION.   # S/p hypofractionated course of radiation therapy over 3 weeks to her right breast would also boost her scar another 1600 centigrade based on the close margins.   # RIGHT LE NON-HODGKIN-  FOLLICULAR LYMPHOMA [surgery- at [facility] s/p RT-Dr.Crystal] 2018.    Ductal carcinoma in situ (DCIS) of right breast  03/05/2021 Initial Diagnosis   Ductal carcinoma in situ (DCIS) of right breast   03/05/2021 Cancer Staging   Staging form: Breast, AJCC 8th Edition - Clinical: Stage 0 (cTis (DCIS), cN0, cM0) - Signed by Cammie Sickle, MD on 03/05/2021  Stage prefix: Initial diagnosis       RISK FACTORS:  Menarche was at age 52-13.  First live birth at age 23.  Ovaries intact: yes.  Hysterectomy: no.  Menopausal status: postmenopausal.  HRT use: short time Colonoscopy: yes; normal. Mammogram within the last year: yes. Number of breast biopsies: 1.  Past Medical History:  Diagnosis Date   Arthritis    Bruises easily    Current use of long term anticoagulation    Apixaban   Diabetes mellitus    Edema    FEET/ LEGS   Family history of breast cancer    Family history of colon cancer    Family history of Hodgkin's disease    Family history of ovarian cancer    GERD (gastroesophageal reflux disease)    HLD (hyperlipidemia)    Hypertension    Hypothyroidism    Lymphoma ([place]) 12/2017   RADIATION RIGHT LEG    MRSA infection    remote history; posterior torso   Multiple thyroid nodules    Paroxysmal atrial fibrillation ([place])    Personal history of radiation therapy 12/2017   lymphoma    Past  Surgical History:  Procedure Laterality Date   BONE BIOPSY     2018   BREAST BIOPSY Right 02/26/2021   ffirm bx-"X" clip-path pending   BREAST LUMPECTOMY Right 03/09/2021   Procedure: BREAST LUMPECTOMY;  Surgeon: Robert Bellow, MD;  Location: [facility] ORS;  Service: General;  Laterality: Right;   CARDIOVERSION N/A 01/20/2017   Procedure: CARDIOVERSION;  Surgeon: Minna Merritts, MD;  Location: [facility] ORS;  Service: Cardiovascular;  Laterality: N/A;   CARDIOVERSION     AFIB 2018   CATARACT EXTRACTION W/PHACO Right 02/15/2018   Procedure: CATARACT EXTRACTION PHACO AND  INTRAOCULAR LENS PLACEMENT ([place]);  Surgeon: Birder Robson, MD;  Location: [facility] ORS;  Service: Ophthalmology;  Laterality: Right;  [place] 01:22.8 AP% 18.9 CDE 15.62 Fluid Pack Lot # [id number]   CATARACT EXTRACTION W/PHACO Left 03/08/2018   Procedure: CATARACT EXTRACTION PHACO AND INTRAOCULAR LENS PLACEMENT ([facility]);  Surgeon: Birder Robson, MD;  Location: [facility] ORS;  Service: Ophthalmology;  Laterality: Left;  [place] 01:05 AP% 15.8 CDE 10.28 Fluid pak lot # [id number]   COLONOSCOPY     EYE SURGERY      Social History   Socioeconomic History   Marital status: Widowed    Spouse name: Not on file   Number of children: Not on file   Years of education: Not on file   Highest education level: Not on file  Occupational History   Not on file  Tobacco Use   Smoking status: Never   Smokeless tobacco: Never  Substance and Sexual Activity   Alcohol use: No   Drug use: No   Sexual activity: Not on file  Other Topics Concern   Not on file  Social History Narrative   Lives by self; son lives about 2 miles. Never smoked; no alcohol. Used to [employer].    Social Determinants of Health   Financial Resource Strain: Not on file  Food Insecurity: Not on file  Transportation Needs: Not on file  Physical Activity: Not on file  Stress: Not on file  Social Connections: Not on file     FAMILY HISTORY:  We obtained a detailed, 4-generation family history.  Significant diagnoses are listed below: Family History  Problem Relation Age of Onset   Breast cancer Sister 56   Heart disease Brother    Ovarian cancer Maternal Aunt        another aunt- colon cancer   Stroke Sister    Arrhythmia Sister        A-Fib   Kidney cancer Neg Hx    Bladder Cancer Neg Hx    Ms. Payne has 1 son, Ronalee Belts, 107, and 1 daughter, [name], 11. Neither have had cancer. Patient had 2 brothers and 3 sisters. A brother had Hogdkin lymphoma twice and died in his late 3s. A sister had breast cancer in her 57s. Her daughter also had  breast cancer in her 30s-40s.   Ms. Lieber mother died at 27. Patient had 3 maternal aunts, 1 uncle. One aunt had ovarian cancer late in life, another aunt had colon cancer under 83. No other known cancers on this side of the family.  Ms. Cadenhead's father died at 38 and had 3 brothers. No known cancers on this side of the family, both paternal grandparents died under 1.  Ms. Pote is unaware of previous family history of genetic testing for hereditary cancer risks. Patient's maternal ancestors are of unknown descent, and paternal ancestors are of unknown descent. There is no reported Ashkenazi Jewish ancestry. There  is no known consanguinity.    GENETIC COUNSELING ASSESSMENT: Ms. Kutzer is a 87 y.o. female with a personal and family history of breast cancer which is somewhat suggestive of a hereditary cancer syndrome and predisposition to cancer. We, therefore, discussed and recommended the following at today's visit.   DISCUSSION: We discussed that approximately 5-10% of breast cancer is hereditary. Most cases of hereditary breast cancer are associated with BRCA1/BRCA2 genes. These genes are also associated with ovarian cancer. There are genes associated with other cancers we see in her family as well such as colon and lymphoma. Cancers and risks are gene specific.  We discussed that testing is beneficial for several reasons including knowing about other cancer risks, identifying potential screening and risk-reduction options that may be appropriate, and to understand if other family members could be at risk for cancer and allow them to undergo genetic testing.   We reviewed the characteristics, features and inheritance patterns of hereditary cancer syndromes. We also discussed genetic testing, including the appropriate family members to test, the process of testing, insurance coverage and turn-around-time for results. We discussed the implications of a negative, positive and/or variant of  uncertain significant result. We recommended Ms. Smither pursue genetic testing for the [facility] Multi-Cancer+RNA gene panel.   The Multi-Cancer Panel + RNA offered by [facility] includes sequencing and/or deletion duplication testing of the following 84 genes: AIP, ALK, APC, ATM, AXIN2,BAP1,  BARD1, BLM, BMPR1A, BRCA1, BRCA2, BRIP1, CASR, CDC73, CDH1, CDK4, CDKN1B, CDKN1C, CDKN2A (p14ARF), CDKN2A (p16INK4a), CEBPA, CHEK2, CTNNA1, DICER1, DIS3L2, EGFR (c.2369C>T, p.Thr790Met variant only), EPCAM (Deletion/duplication testing only), FH, FLCN, GATA2, GPC3, GREM1 (Promoter region deletion/duplication testing only), HOXB13 (c.251G>A, p.Gly84Glu), HRAS, KIT, MAX, MEN1, MET, MITF (c.952G>A, p.Glu318Lys variant only), MLH1, MSH2, MSH3, MSH6, MUTYH, NBN, NF1, NF2, NTHL1, PALB2, PDGFRA, PHOX2B, PMS2, POLD1, POLE, POT1, PRKAR1A, PTCH1, PTEN, RAD50, RAD51C, RAD51D, RB1, RECQL4, RET, RUNX1, SDHAF2, SDHA (sequence changes only), SDHB, SDHC, SDHD, SMAD4, SMARCA4, SMARCB1, SMARCE1, STK11, SUFU, TERC, TERT, TMEM127, TP53, TSC1, TSC2, VHL, WRN and WT1.  Based on Ms. Huxford's personal and family history of cancer, she meets medical criteria for genetic testing. Despite that she meets criteria, she may still have an out of pocket cost. We discussed that if her out of pocket cost for testing is over $100, the laboratory will call and confirm whether she wants to proceed with testing.  If the out of pocket cost of testing is less than $100 she will be billed by the genetic testing laboratory.   PLAN: After considering the risks, benefits, and limitations, Ms. Latini provided informed consent to pursue genetic testing and the blood sample was sent to [facility] for analysis of the Multi-Cancer +RNA panel. Results should be available within approximately 2-3 weeks' time, at which point they will be disclosed by telephone to Ms. Koskela, as will any additional recommendations warranted by these results. Ms. Mondesir will receive a  summary of her genetic counseling visit and a copy of her results once available. This information will also be available in Epic.   Ms. Mclouth questions were answered to her satisfaction today. Our contact information was provided should additional questions or concerns arise. Thank you for the referral and allowing [place] to share in the care of your patient.   Faith Rogue, MS, [facility] Genetic Counselor [email or web address] Phone: [phone]  The patient was seen for a total of 30 minutes in face-to-face genetic counseling.  Patient was seen with her daughter, [name]. [name] request that we call her with results.  Dr. Grayland Ormond was available for discussion regarding this case.   _______________________________________________________________________ For Office Staff:  Number of people involved in session: 2 Was an Intern/ student involved with case: no

## 2021-07-25 ENCOUNTER — Other Ambulatory Visit: Payer: Self-pay | Admitting: Cardiovascular Disease

## 2021-07-27 NOTE — Telephone Encounter (Signed)
Please schedule overdue F/U appointment. Thank you! ?

## 2021-07-30 ENCOUNTER — Other Ambulatory Visit: Payer: Self-pay | Admitting: Cardiovascular Disease

## 2021-07-30 NOTE — Telephone Encounter (Signed)
Refill Request.  

## 2021-07-30 NOTE — Telephone Encounter (Signed)
*  STAT* If patient is at the pharmacy, call can be transferred to refill team.   1. Which medications need to be refilled? (please list name of each medication and dose if known)  olmesartan 40 MG 1 tablet every day Eliquis 5 MG 1 tablet 2 times daily  2. Which pharmacy/location (including street and city if local pharmacy) is medication to be sent to? [facility] on [address] (not in [facility])  3. Do they need a 30 day or 90 day supply? 90 day

## 2021-07-30 NOTE — Telephone Encounter (Signed)
Prescription refill request for Eliquis received. Indication:AFIB Last office visit:GOLLAN 12/23/20 Scr:1.1 07/14/21 Age: 28F Weight:79.9KG

## 2021-08-22 ENCOUNTER — Encounter: Payer: Self-pay | Admitting: Internal Medicine

## 2021-08-24 ENCOUNTER — Encounter: Payer: Self-pay | Admitting: Licensed Clinical Social Worker

## 2021-08-26 ENCOUNTER — Telehealth: Payer: Self-pay | Admitting: Licensed Clinical Social Worker

## 2021-08-26 ENCOUNTER — Encounter: Payer: Self-pay | Admitting: Licensed Clinical Social Worker

## 2021-08-26 ENCOUNTER — Ambulatory Visit: Payer: Self-pay | Admitting: Licensed Clinical Social Worker

## 2021-08-26 DIAGNOSIS — Z1379 Encounter for other screening for genetic and chromosomal anomalies: Secondary | ICD-10-CM | POA: Insufficient documentation

## 2021-08-26 DIAGNOSIS — D0511 Intraductal carcinoma in situ of right breast: Secondary | ICD-10-CM

## 2021-08-26 DIAGNOSIS — Z807 Family history of other malignant neoplasms of lymphoid, hematopoietic and related tissues: Secondary | ICD-10-CM

## 2021-08-26 DIAGNOSIS — Z800 Family history of malignant neoplasm of digestive organs: Secondary | ICD-10-CM

## 2021-08-26 DIAGNOSIS — Z7189 Other specified counseling: Secondary | ICD-10-CM | POA: Insufficient documentation

## 2021-08-26 DIAGNOSIS — Z8041 Family history of malignant neoplasm of ovary: Secondary | ICD-10-CM

## 2021-08-26 DIAGNOSIS — Z803 Family history of malignant neoplasm of breast: Secondary | ICD-10-CM

## 2021-08-26 NOTE — Telephone Encounter (Signed)
Revealed negative genetic testing.  This normal result is reassuring and indicates that it is unlikely Barbara Mooney's cancer is due to a hereditary cause.  It is unlikely that there is an increased risk of another cancer due to a mutation in one of these genes.  However, genetic testing is not perfect, and cannot definitively rule out a hereditary cause.  It will be important for her to keep in contact with genetics to learn if any additional testing may be needed in the future.

## 2021-08-26 NOTE — Progress Notes (Signed)
HPI:  Barbara Mooney was previously seen in the [place] clinic due to a personal and family history of cancer and concerns regarding a hereditary predisposition to cancer. Please refer to our prior cancer genetics clinic note for more information regarding our discussion, assessment and recommendations, at the time. [name] recent genetic test results were disclosed to her, as were recommendations warranted by these results. These results and recommendations are discussed in more detail below.  CANCER HISTORY:  Oncology History Overview Note  A. BREAST WITH CALCIFICATIONS, RIGHT LOWER INNER; STEREOTACTIC BIOPSY:  - HIGH-GRADE DUCTAL CARCINOMA IN SITU (DCIS), COMEDO TYPE.  - LOBULAR NEOPLASIA.  - CALCIFICATIONS ASSOCIATED WITH DCIS AND COLUMNAR CELL CHANGE.  DIAGNOSIS:  A.  BREAST, RIGHT LOWER INNER QUADRANT; LUMPECTOMY:  - HIGH-GRADE DUCTAL CARCINOMA IN SITU (DCIS), COMEDO TYPE.  - PLEOMORPHIC LOBULAR CARCINOMA IN SITU (PLCIS).  - SEE CANCER SUMMARY BELOW.  - LOBULAR NEOPLASIA.  - PRIOR BIOPSY SITE WITH HEMATOMA AND X CLIP.  - SKIN WITH CHANGES CONSISTENT WITH PRIOR BIOPSY  - NON-NEOPLASTIC BREAST WITH INTRADUCTAL PAPILLOMA, CYSTIC APOCRINE  METAPLASIA, AND VASCULAR CALCIFICATION.   # S/p hypofractionated course of radiation therapy over 3 weeks to her right breast would also boost her scar another 1600 centigrade based on the close margins.   # RIGHT LE NON-HODGKIN- FOLLICULAR LYMPHOMA [surgery- at [facility] s/p RT-Dr.Crystal] 2018.    Ductal carcinoma in situ (DCIS) of right breast  03/05/2021 Initial Diagnosis   Ductal carcinoma in situ (DCIS) of right breast   03/05/2021 Cancer Staging   Staging form: Breast, AJCC 8th Edition - Clinical: Stage 0 (cTis (DCIS), cN0, cM0) - Signed by Cammie Sickle, MD on 03/05/2021 Stage prefix: Initial diagnosis    Genetic Testing   Negative genetic testing. No pathogenic variants identified on the [facility] Multi-Cancer Panel  +RNA. The report date is 08/26/2021.  The Multi-Cancer Panel + RNA offered by [facility] includes sequencing and/or deletion duplication testing of the following 84 genes: AIP, ALK, APC, ATM, AXIN2,BAP1,  BARD1, BLM, BMPR1A, BRCA1, BRCA2, BRIP1, CASR, CDC73, CDH1, CDK4, CDKN1B, CDKN1C, CDKN2A (p14ARF), CDKN2A (p16INK4a), CEBPA, CHEK2, CTNNA1, DICER1, DIS3L2, EGFR (c.2369C>T, p.Thr790Met variant only), EPCAM (Deletion/duplication testing only), FH, FLCN, GATA2, GPC3, GREM1 (Promoter region deletion/duplication testing only), HOXB13 (c.251G>A, p.Gly84Glu), HRAS, KIT, MAX, MEN1, MET, MITF (c.952G>A, p.Glu318Lys variant only), MLH1, MSH2, MSH3, MSH6, MUTYH, NBN, NF1, NF2, NTHL1, PALB2, PDGFRA, PHOX2B, PMS2, POLD1, POLE, POT1, PRKAR1A, PTCH1, PTEN, RAD50, RAD51C, RAD51D, RB1, RECQL4, RET, RUNX1, SDHAF2, SDHA (sequence changes only), SDHB, SDHC, SDHD, SMAD4, SMARCA4, SMARCB1, SMARCE1, STK11, SUFU, TERC, TERT, TMEM127, TP53, TSC1, TSC2, VHL, WRN and WT1.     FAMILY HISTORY:  We obtained a detailed, 4-generation family history.  Significant diagnoses are listed below: Family History  Problem Relation Age of Onset   Breast cancer Sister 4   Heart disease Brother    Ovarian cancer Maternal Aunt        another aunt- colon cancer   Stroke Sister    Arrhythmia Sister        A-Fib   Kidney cancer Neg Hx    Bladder Cancer Neg Hx     Ms. Amir has 1 son, Ronalee Belts, 36, and 1 daughter, [name], 39. Neither have had cancer. Patient had 2 brothers and 3 sisters. A brother had Hogdkin lymphoma twice and died in his late 66s. A sister had breast cancer in her 82s. Her daughter also had breast cancer in her 30s-40s.    [name] mother died  at 43. Patient had 3 maternal aunts, 1 uncle. One aunt had ovarian cancer late in life, another aunt had colon cancer under 44. No other known cancers on this side of the family.   [name]'s father died at 65 and had 3 brothers. No known cancers on this side of the family, both  paternal grandparents died under 64.   Ms. Pat is unaware of previous family history of genetic testing for hereditary cancer risks. Patient's maternal ancestors are of unknown descent, and paternal ancestors are of unknown descent. There is no reported Ashkenazi Jewish ancestry. There is no known consanguinity.    GENETIC TEST RESULTS: Genetic testing reported out on 08/26/2021 through the [facility] Multi-Cancer+RNA cancer panel found no pathogenic mutations.   The Multi-Cancer Panel + RNA offered by [facility] includes sequencing and/or deletion duplication testing of the following 84 genes: AIP, ALK, APC, ATM, AXIN2,BAP1,  BARD1, BLM, BMPR1A, BRCA1, BRCA2, BRIP1, CASR, CDC73, CDH1, CDK4, CDKN1B, CDKN1C, CDKN2A (p14ARF), CDKN2A (p16INK4a), CEBPA, CHEK2, CTNNA1, DICER1, DIS3L2, EGFR (c.2369C>T, p.Thr790Met variant only), EPCAM (Deletion/duplication testing only), FH, FLCN, GATA2, GPC3, GREM1 (Promoter region deletion/duplication testing only), HOXB13 (c.251G>A, p.Gly84Glu), HRAS, KIT, MAX, MEN1, MET, MITF (c.952G>A, p.Glu318Lys variant only), MLH1, MSH2, MSH3, MSH6, MUTYH, NBN, NF1, NF2, NTHL1, PALB2, PDGFRA, PHOX2B, PMS2, POLD1, POLE, POT1, PRKAR1A, PTCH1, PTEN, RAD50, RAD51C, RAD51D, RB1, RECQL4, RET, RUNX1, SDHAF2, SDHA (sequence changes only), SDHB, SDHC, SDHD, SMAD4, SMARCA4, SMARCB1, SMARCE1, STK11, SUFU, TERC, TERT, TMEM127, TP53, TSC1, TSC2, VHL, WRN and WT1.   The test report has been scanned into EPIC and is located under the Molecular Pathology section of the Results Review tab.  A portion of the result report is included below for reference.     We discussed that because current genetic testing is not perfect, it is possible there may be a gene mutation in one of these genes that current testing cannot detect, but that chance is small.  There could be another gene that has not yet been discovered, or that we have not yet tested, that is responsible for the cancer diagnoses in the family. It is  also possible there is a hereditary cause for the cancer in the family that Ms. Kochan did not inherit and therefore was not identified in her testing.  Therefore, it is important to remain in touch with cancer genetics in the future so that we can continue to offer Ms. Bitterman the most up to date genetic testing.   ADDITIONAL GENETIC TESTING: We discussed with Ms. Heath that her genetic testing was fairly extensive.  If there are genes identified to increase cancer risk that can be analyzed in the future, we would be happy to discuss and coordinate this testing at that time.    CANCER SCREENING RECOMMENDATIONS: Ms. Martindelcampo test result is considered negative (normal).  This means that we have not identified a hereditary cause for her  personal and family history of cancer at this time. Most cancers happen by chance and this negative test suggests that her cancer may fall into this category.    While reassuring, this does not definitively rule out a hereditary predisposition to cancer. It is still possible that there could be genetic mutations that are undetectable by current technology. There could be genetic mutations in genes that have not been tested or identified to increase cancer risk.  Therefore, it is recommended she continue to follow the cancer management and screening guidelines provided by her oncology and primary healthcare provider.   An individual's cancer  risk and medical management are not determined by genetic test results alone. Overall cancer risk assessment incorporates additional factors, including personal medical history, family history, and any available genetic information that may result in a personalized plan for cancer prevention and surveillance.   RECOMMENDATIONS FOR FAMILY MEMBERS:  Relatives in this family might be at some increased risk of developing cancer, over the general population risk, simply due to the family history of cancer.  We recommended female relatives in  this family have a yearly mammogram beginning at age 28, or 24 years younger than the earliest onset of cancer, an annual clinical breast exam, and perform monthly breast self-exams. Female relatives in this family should also have a gynecological exam as recommended by their primary provider.  All family members should be referred for colonoscopy starting at age 39.   It is also possible there is a hereditary cause for the cancer in Ms. Morones's family that she did not inherit and therefore was not identified in her.  Based on Ms. Tendler's family history, we recommended her niece who had breast cancer in her 30s/40s have genetic counseling and testing, as well as maternal relatives, especially those related to her aunt with ovarian and aunt with colon under 72. Ms. Kubisiak will let us know if we can be of any assistance in coordinating genetic counseling and/or testing for these family members.  FOLLOW-UP: Lastly, we discussed with Ms. Meares that cancer genetics is a rapidly advancing field and it is possible that new genetic tests will be appropriate for her and/or her family members in the future. We encouraged her to remain in contact with cancer genetics on an annual basis so we can update her personal and family histories and let her know of advances in cancer genetics that may benefit this family.   Our contact number was provided. Ms. Milliron questions were answered to her satisfaction, and she knows she is welcome to call us at anytime with additional questions or concerns.   Faith Rogue, MS, [facility] Genetic Counselor [email or web address] Phone: [phone]

## 2021-08-31 ENCOUNTER — Other Ambulatory Visit: Payer: Self-pay

## 2021-08-31 ENCOUNTER — Encounter: Payer: Self-pay | Admitting: Nurse Practitioner

## 2021-08-31 ENCOUNTER — Ambulatory Visit (INDEPENDENT_AMBULATORY_CARE_PROVIDER_SITE_OTHER): Payer: Medicare Other | Admitting: Nurse Practitioner

## 2021-08-31 VITALS — BP 156/94 | HR 91 | Ht 65.5 in | Wt 173.4 lb

## 2021-08-31 DIAGNOSIS — E782 Mixed hyperlipidemia: Secondary | ICD-10-CM

## 2021-08-31 DIAGNOSIS — I10 Essential (primary) hypertension: Secondary | ICD-10-CM | POA: Diagnosis not present

## 2021-08-31 DIAGNOSIS — I4821 Permanent atrial fibrillation: Secondary | ICD-10-CM

## 2021-08-31 NOTE — Patient Instructions (Addendum)
Medication Instructions:  No changes at this time.   *If you need a refill on your cardiac medications before your next appointment, please call your pharmacy*   Lab Work: None  If you have labs (blood work) drawn today and your tests are completely normal, you will receive your results only by: MyChart Message (if you have MyChart) OR A paper copy in the mail If you have any lab test that is abnormal or we need to change your treatment, we will call you to review the results.   Testing/Procedures: None   Follow-Up: At [facility], you and your health needs are our priority.  As part of our continuing mission to provide you with exceptional heart care, we have created designated Provider Care Teams.  These Care Teams include your primary Cardiologist (physician) and Advanced Practice Providers (APPs -  Physician Assistants and Nurse Practitioners) who all work together to provide you with the care you need, when you need it.   Your next appointment:   1 year(s)  The format for your next appointment:   In Person  Provider:   Timothy Gollan, MD  

## 2021-08-31 NOTE — Progress Notes (Signed)
Office Visit    Patient Name: Barbara Mooney Date of Encounter: 08/31/2021  Primary Care Provider:  Marinda Elk, MD Primary Cardiologist:  Barbara Rogue, MD  Chief Complaint    87 year old female with a history of permanent atrial fibrillation, hypertension, HL, diabetes, GERD, follicular lymphoma, breast cancer, and hyperkalemia, who presents for f/u related to afib.  Past Medical History    Past Medical History:  Diagnosis Date   Arthritis    Bruises easily    Current use of long term anticoagulation    Apixaban   Diabetes mellitus    Edema    FEET/ LEGS   Family history of breast cancer    Family history of colon cancer    Family history of Hodgkin's disease    Family history of ovarian cancer    GERD (gastroesophageal reflux disease)    History of echocardiogram    a. 08/2020 Echo: EF 60-65%, no rwma. RVSP 37.39mHg. Mod dil LA. Mild to mod MR/TR. Mild AoV sclerosis.   HLD (hyperlipidemia)    Hypertension    Hypothyroidism    Leg pain    a. 08/2020 ABIs: R 1.19, L 0.97.   [place] 12[place]   RADIATION RIGHT LEG    MRSA infection    remote history; posterior torso   Multiple thyroid nodules    Permanent atrial fibrillation ([employer]    a. Dx 10/2016; b. 01/2017 s/p DCCV-->failed-->rate controlled; c. CHA2DS2VASc = 5-->eliquis.   Personal history of radiation therapy 12/2017   lymphoma   Past Surgical History:  Procedure Laterality Date   BONE BIOPSY     2018   BREAST BIOPSY Right 02/26/2021   ffirm bx-"X" clip-path pending   BREAST LUMPECTOMY Right 03/09/2021   Procedure: BREAST LUMPECTOMY;  Surgeon: BRobert Bellow MD;  Location: [facility];  Service: General;  Laterality: Right;   CARDIOVERSION N/A 01/20/2017   Procedure: CARDIOVERSION;  Surgeon: TMinna Merritts MD;  Location: [facility];  Service: Cardiovascular;  Laterality: N/A;   CARDIOVERSION     AFIB 2018   CATARACT EXTRACTION W/PHACO Right 02/15/2018   Procedure: CATARACT EXTRACTION  PHACO AND INTRAOCULAR LENS PLACEMENT ([place];  Surgeon: PBirder Robson MD;  Location: [facility];  Service: Ophthalmology;  Laterality: Right;  [place]:22.8 AP% 18.9 CDE 15.62 Fluid Pack Lot # [id number]   CATARACT EXTRACTION W/PHACO Left 03/08/2018   Procedure: CATARACT EXTRACTION PHACO AND INTRAOCULAR LENS PLACEMENT (IOC);  Surgeon: PBirder Robson MD;  Location: [facility];  Service: Ophthalmology;  Laterality: Left;  [place]:05 AP% 15.8 CDE 10.28 Fluid pak lot # [id number]   COLONOSCOPY     EYE SURGERY      Allergies  Allergies  Allergen Reactions   Nitrofurantoin Diarrhea, Nausea Only and Other (See Comments)   Sulfa Antibiotics Nausea Only and Other (See Comments)    History of Present Illness    87year old female with a history of permanent atrial fibrillation, hypertension, hyperlipidemia, diabetes, GERD, follicular lymphoma, breast cancer, and hyperkalemia.  She previously underwent echocardiogram in 2017 showing an EF of 55-60% with mild LVH, normal wall motion, and no significant valvular abnormalities.  In November 2017, she was noted to be in atrial fibrillation with rapid ventricular response in the setting of sepsis.  She subsequently underwent cardioversion in February 2018 but unfortunately had return of atrial fibrillation.  As she was reasonably well rate controlled and overall asymptomatic, decision was made to continue a rate control strategy.  Due to bradycardia, metoprolol was discontinued  at some point, though she has remained on long-acting diltiazem, and is anticoagulated with Eliquis.  [name] reported fatigue in August 2021 and underwent echocardiography, which again showed normal LV function (60-65%).  RVSP was elevated at 37.5 mmHg.  The left atrium was moderately dilated.  She was noted to have mild to moderate MR and TR as well as aortic valve sclerosis without stenosis.  She also underwent ABIs in September 2021, in the setting of leg fatigue, which were  normal.  [name] was last seen in cardiology clinic on January 11 of this year, at which time she was feeling reasonably well.  In March, she underwent a lumpectomy with diagnosis of right breast cancer.  She subsequently underwent radiation and has been followed by oncology.  Patient notes that she did have some fatigue in the setting of radiation therapy but overall tolerated this well.  From a cardiac standpoint, she continues to do well.  She is able to complete all of her housework without any symptoms or limitations.  She denies palpitations but notes that when she checks her pulse it is irregular.  She denies chest pain, dyspnea, PND, orthopnea, dizziness, syncope, or early satiety.  She sometimes notes mild lower extremity swelling at the end of the day.  Blood pressure is elevated today though this typically runs around 130 at home.  Home Medications    Current Outpatient Medications  Medication Sig Dispense Refill   acetaminophen (TYLENOL) 500 MG tablet Take 500 mg by mouth at bedtime.     CRANBERRY PO Take 1 capsule by mouth 2 (two) times daily.      cyanocobalamin (,VITAMIN B-12,) 1000 MCG/ML injection Inject 1,000 mcg into the muscle every 30 (thirty) days.      diltiazem (CARDIZEM CD) 180 MG 24 hr capsule TAKE 1 CAPSULE BY MOUTH TWICE A DAY 180 capsule 0   ELIQUIS 5 MG TABS tablet TAKE 1 TABLET BY MOUTH TWICE A DAY 180 tablet 1   furosemide (LASIX) 20 MG tablet TAKE 1 TABLET (20 MG TOTAL) BY MOUTH DAILY AS NEEDED FOR FLUID OR EDEMA (SOB). 90 tablet 0   metFORMIN (GLUCOPHAGE) 500 MG tablet Take 500 mg by mouth 2 (two) times daily with a meal.     olmesartan (BENICAR) 40 MG tablet TAKE 1 TABLET BY MOUTH EVERY DAY 90 tablet 0   omeprazole (PRILOSEC) 20 MG capsule Take 1 capsule (20 mg total) by mouth daily. 30 capsule 6   Trolamine Salicylate (BLUE-EMU HEMP EX) Apply 1 application topically daily as needed (Knee pain).     No current facility-administered medications for this visit.      Review of Systems    Sometimes notes lower extremity swelling at the end of the day.  She denies chest pain, dyspnea, palpitations, PND, orthopnea, dizziness, syncope, or early satiety.  All other systems reviewed and are otherwise negative except as noted above.  Physical Exam    VS:  BP (!) 156/94   Pulse 91   Ht 5' 5.5" (1.664 m)   Wt 173 lb 6.4 oz (78.7 kg)   SpO2 98%   BMI 28.42 kg/m  , BMI Body mass index is 28.42 kg/m.     GEN: Well nourished, well developed, in no acute distress. HEENT: normal. Neck: Supple, no JVD, carotid bruits, or masses. Cardiac: Irregularly irregular, no murmurs, rubs, or gallops. No clubbing, cyanosis, edema.  Radials/DP/PT 2+ and equal bilaterally.  Respiratory:  Respirations regular and unlabored, clear to auscultation bilaterally. GI:  Soft, nontender, nondistended, BS + x 4. MS: no deformity or atrophy. Skin: warm and dry, no rash. Neuro:  Strength and sensation are intact. Psych: Normal affect.  Accessory Clinical Findings    ECG personally reviewed by me today -atrial fibrillation, 91, right bundle branch- no acute changes.  Lab Results  Component Value Date   WBC 5.6 05/07/2021   HGB 13.4 05/07/2021   HCT 41.5 05/07/2021   MCV 89.6 05/07/2021   PLT 251 05/07/2021   July 14, 2021-labs from care everywhere Sodium 141, potassium 4.2, chloride 103, CO2 30.5, BUN 22, creatinine 1.1, glucose 120 Calcium 9.5, albumin 4.1, total protein 6.5 Total bilirubin 0.5, alkaline phosphatase 86, AST 16, ALT 11 Hemoglobin A1c 6.5 Total cholesterol 234, triglycerides 132, HDL 51.1, LDL 157 TSH 1.896  Assessment & Plan    1.  Permanent atrial fibrillation: This is rate controlled at 91 bpm on diltiazem 180 mg daily.  She is asymptomatic.  She is anticoagulated with Eliquis 5 mg twice daily.  Stable H&H and renal function earlier this year.  2.  Essential hypertension: Blood pressure elevated today at 156/94.  She occasionally checks blood  pressure at home and her daughter says she typically runs in the low 130s.  We discussed a blood pressure goal of less than 130.  Patient will trend blood pressures over the next 2 weeks and send [place] a MyChart message with readings.  Provided that heart rates are suitable, if she is hypertensive on home readings, we can likely increase diltiazem to 240 mg daily.  She otherwise remains on Benicar therapy (dose limited by prior history of hyperkalemia).  3.  Type 2 diabetes mellitus: A1c 6.5 in August.  She is on metformin and ARB.  4.  Hyperlipidemia: Total cholesterol of 234 with LDL of 157.  Previously wished to avoid cholesterol medicine.  5.  Disposition: Patient first follow-up in 1 year.  She will contact us sooner if necessary.   Murray Hodgkins, NP 08/31/2021, 2:00 PM

## 2021-10-14 ENCOUNTER — Other Ambulatory Visit: Payer: Self-pay

## 2021-10-14 MED ORDER — DILTIAZEM HCL ER COATED BEADS 180 MG PO CP24: 180.0000 mg | ORAL_CAPSULE | Freq: Two times a day (BID) | ORAL | 3 refills | Status: DC

## 2021-10-14 MED ORDER — OLMESARTAN MEDOXOMIL 40 MG PO TABS: 40.0000 mg | ORAL_TABLET | Freq: Every day | ORAL | 3 refills | Status: DC

## 2021-11-19 ENCOUNTER — Ambulatory Visit
Admission: RE | Admit: 2021-11-19 | Discharge: 2021-11-19 | Disposition: A | Discharge status: Home / Self Care | Payer: Medicare Other | Source: Ambulatory Visit | Attending: Radiation Oncology | Admitting: Radiation Oncology

## 2021-11-19 ENCOUNTER — Encounter: Payer: Self-pay | Admitting: Radiation Oncology

## 2021-11-19 ENCOUNTER — Other Ambulatory Visit: Payer: Self-pay

## 2021-11-19 VITALS — BP 139/86 | HR 70 | Temp 97.6°F | Wt 172.4 lb

## 2021-11-19 DIAGNOSIS — Z170 Estrogen receptor positive status [ER+]: Secondary | ICD-10-CM | POA: Diagnosis not present

## 2021-11-19 DIAGNOSIS — Z923 Personal history of irradiation: Secondary | ICD-10-CM | POA: Diagnosis not present

## 2021-11-19 DIAGNOSIS — D0511 Intraductal carcinoma in situ of right breast: Secondary | ICD-10-CM | POA: Insufficient documentation

## 2021-11-19 NOTE — Progress Notes (Signed)
Radiation Oncology Follow up Note  Name: Barbara Mooney   Date:   11/19/2021 MRN:  [id number] DOB: December 31, 1933    This 87 y.o. 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  She specifically denies breast tenderness cough or bone pain.  In the past she has been offered aromatase inhibitor although has declined based on side effect profile.  She is not yet had a follow-up mammogram.  COMPLICATIONS OF TREATMENT: none  FOLLOW UP COMPLIANCE: keeps appointments   PHYSICAL EXAM:  BP 139/86   Pulse 70   Temp 97.6 F (36.4 C) (Tympanic)   Wt 172 lb 6.4 oz (78.2 kg)   BMI 28.25 kg/m  Lungs are clear to A&P cardiac examination essentially unremarkable with regular rate and rhythm. No dominant mass or nodularity is noted in either breast in 2 positions examined. Incision is well-healed. No axillary or supraclavicular adenopathy is appreciated. Cosmetic result is excellent.  Well-developed well-nourished patient in NAD. HEENT reveals PERLA, EOMI, discs not visualized.  Oral cavity is clear. No oral mucosal lesions are identified. Neck is clear without evidence of cervical or supraclavicular adenopathy. Lungs are clear to A&P. Cardiac examination is essentially unremarkable with regular rate and rhythm without murmur rub or thrill. Abdomen is benign with no organomegaly or masses noted. Motor sensory and DTR levels are equal and symmetric in the upper and lower extremities. Cranial nerves II through XII are grossly intact. Proprioception is intact. No peripheral adenopathy or edema is identified. No motor or sensory levels  are noted. Crude visual fields are within normal range.  RADIOLOGY RESULTS: No current films to review  PLAN: Present time patient is doing well no evidence of disease now out 6 months from whole breast radiation and pleased with her overall progress.  I have asked to see her back in 6 months for follow-up.  I have made sure she has follow-up appointments with medical oncology.  Patient knows to call with any concerns.  I would like to take this opportunity to thank you for allowing me to participate in the care of your patient.Noreene Filbert, MD

## 2022-01-25 ENCOUNTER — Other Ambulatory Visit: Payer: Self-pay | Admitting: General Surgery

## 2022-01-25 DIAGNOSIS — D0511 Intraductal carcinoma in situ of right breast: Secondary | ICD-10-CM

## 2022-02-19 ENCOUNTER — Other Ambulatory Visit: Payer: Self-pay

## 2022-02-19 ENCOUNTER — Ambulatory Visit
Admission: RE | Admit: 2022-02-19 | Discharge: 2022-02-19 | Disposition: A | Discharge status: Home / Self Care | Payer: Medicare Other | Source: Ambulatory Visit | Attending: General Surgery | Admitting: General Surgery

## 2022-02-19 DIAGNOSIS — D0511 Intraductal carcinoma in situ of right breast: Secondary | ICD-10-CM

## 2022-02-25 ENCOUNTER — Other Ambulatory Visit: Payer: Self-pay | Admitting: Cardiovascular Disease

## 2022-02-26 NOTE — Telephone Encounter (Signed)
Prescription refill request for Eliquis received. ?Indication: Atrial fib ?Last office visit: 08/31/21  Laban Emperor NP ?Scr: 0.9 on 01/21/22 ?Age: 88 ?Weight: 78.7kg ? ?Based on above findings Eliquis '5mg'$  twice daily is the appropriate dose.  Refill approved. ? ?

## 2022-03-22 NOTE — Telephone Encounter (Signed)
Please see note below. 

## 2022-03-23 ENCOUNTER — Encounter: Payer: Self-pay | Admitting: Cardiovascular Disease

## 2022-05-19 ENCOUNTER — Ambulatory Visit
Admission: RE | Admit: 2022-05-19 | Discharge: 2022-05-19 | Disposition: A | Discharge status: Home / Self Care | Payer: Medicare Other | Source: Ambulatory Visit | Attending: Radiation Oncology | Admitting: Radiation Oncology

## 2022-05-19 VITALS — BP 144/72 | HR 80 | Temp 97.0°F | Resp 18 | Ht 65.5 in | Wt 175.4 lb

## 2022-05-19 DIAGNOSIS — Z853 Personal history of malignant neoplasm of breast: Secondary | ICD-10-CM | POA: Insufficient documentation

## 2022-05-19 DIAGNOSIS — Z923 Personal history of irradiation: Secondary | ICD-10-CM | POA: Insufficient documentation

## 2022-05-19 DIAGNOSIS — D0511 Intraductal carcinoma in situ of right breast: Secondary | ICD-10-CM

## 2022-05-19 NOTE — Progress Notes (Signed)
Radiation Oncology Follow up Note  Name: Barbara Mooney   Date:   05/19/2022 MRN:  [id number] DOB: [birth year]    This 88 y.o. 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  She specifically denies breast tenderness cough or bone pain..  She had mammograms back in March which I have reviewed were BI-RADS 2 benign.  She has declined antiestrogen therapy.  COMPLICATIONS OF TREATMENT: none  FOLLOW UP COMPLIANCE: keeps appointments   PHYSICAL EXAM:  BP (!) 144/72   Pulse 80   Temp (!) 97 F (36.1 C)   Resp 18   Ht 5' 5.5" (1.664 m)   Wt 175 lb 6.4 oz (79.6 kg)   BMI 28.74 kg/m  Lungs are clear to A&P cardiac examination essentially unremarkable with regular rate and rhythm. No dominant mass or nodularity is noted in either breast in 2 positions examined. Incision is well-healed. No axillary or supraclavicular adenopathy is appreciated. Cosmetic result is excellent.  Well-developed well-nourished patient in NAD. HEENT reveals PERLA, EOMI, discs not visualized.  Oral cavity is clear. No oral mucosal lesions are identified. Neck is clear without evidence of cervical or supraclavicular adenopathy. Lungs are clear to A&P. Cardiac examination is essentially unremarkable with regular rate and rhythm without murmur rub or thrill. Abdomen is benign with no organomegaly or masses noted. Motor sensory and DTR levels are equal and symmetric in the upper and lower extremities. Cranial nerves II through XII are grossly intact. Proprioception is intact. No peripheral adenopathy or edema is identified. No motor or sensory levels are  noted. Crude visual fields are within normal range.  RADIOLOGY RESULTS: Mammograms reviewed compatible with above-stated findings  PLAN: Present time patient is doing well with no evidence of disease now out 1 year.  And pleased with her overall progress.  I have asked to see her back in 1 year for follow-up.  Patient knows to call with any concerns.  I would like to take this opportunity to thank you for allowing me to participate in the care of your patient.Noreene Filbert, MD

## 2022-07-27 ENCOUNTER — Ambulatory Visit (INDEPENDENT_AMBULATORY_CARE_PROVIDER_SITE_OTHER): Payer: Medicare Other | Admitting: Cardiovascular Disease

## 2022-07-27 ENCOUNTER — Encounter: Payer: Self-pay | Admitting: Cardiovascular Disease

## 2022-07-27 VITALS — BP 122/70 | HR 96 | Ht 66.0 in | Wt 172.4 lb

## 2022-07-27 DIAGNOSIS — I4821 Permanent atrial fibrillation: Secondary | ICD-10-CM

## 2022-07-27 DIAGNOSIS — E782 Mixed hyperlipidemia: Secondary | ICD-10-CM

## 2022-07-27 DIAGNOSIS — I10 Essential (primary) hypertension: Secondary | ICD-10-CM

## 2022-07-27 DIAGNOSIS — R0600 Dyspnea, unspecified: Secondary | ICD-10-CM | POA: Diagnosis not present

## 2022-07-27 DIAGNOSIS — I739 Peripheral vascular disease, unspecified: Secondary | ICD-10-CM | POA: Diagnosis not present

## 2022-07-27 DIAGNOSIS — R5383 Other fatigue: Secondary | ICD-10-CM

## 2022-07-27 MED ORDER — DILTIAZEM HCL ER COATED BEADS 180 MG PO CP24
180.0000 mg | ORAL_CAPSULE | Freq: Two times a day (BID) | ORAL | 3 refills | Status: DC
Start: 2022-07-27 — End: 2023-10-03

## 2022-07-27 MED ORDER — OLMESARTAN MEDOXOMIL 40 MG PO TABS: 40.0000 mg | ORAL_TABLET | Freq: Every day | ORAL | 3 refills | Status: DC

## 2022-07-27 MED ORDER — FUROSEMIDE 20 MG PO TABS: 20.0000 mg | ORAL_TABLET | Freq: Every day | ORAL | 3 refills | Status: DC | PRN

## 2022-07-27 NOTE — Progress Notes (Unsigned)
Date:  07/27/2022   ID:  Lenetta Quaker, DOB Nov 21, 1934, MRN [id number]  Patient Location:  [place] [address]   Provider location:   [facility], [place] office  PCP:  Marinda Elk, MD  Cardiologist:  Arvid Right [facility]   Chief Complaint  Patient presents with   Follow-up    12 month follow up, no new cardiac concerns, (Low energy)      History of Present Illness:    [name] is a 88 y.o. 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 Who presents for routine follow-up of her persistent atrial fibrillation, bradycardia  Last seen in clinic by myself January 2022 Lives alone  Chronic fatigue Takes lasix 1 x a every two weeks    Husband passed 04/2020 Does her ADL,  Active but no regular exercise program Legs weak but no falls  Lab work reviewed HBA1c 6 to 7 Lasix sparingly CBC good, BMP good, numbers reviewed with her  Blood pressure reasonable at home Denies orthostasis  Prior history of vertigo, none recently  Other past medical history reviewed Broke arm left in oct 2019  asymptomatic from her atrial fibrillation  Metoprolol and Cardizem added for rate control  Currently taking Eliquis '5mg'$  bid.    This patients CHA2DS2-VASc Score and unadjusted Ischemic Stroke Rate (% per year) is equal to 7.2 % stroke rate/year from a score of 5   Above score calculated as 1 point each if present [CHF, HTN, DM, Vascular=MI/PAD/Aortic Plaque, Age if 65-74, or Female] Above score calculated as 2 points each if present [Age > 75, or Stroke/TIA/TE]  Cardioversion on 01/20/2017, back to NSR    Past Medical History:  Diagnosis Date   Arthritis     Breast cancer ([place]) 03/09/2021   Right Breast   Bruises easily    Current use of long term anticoagulation    Apixaban   Diabetes mellitus    Edema    FEET/ LEGS   Family history of breast cancer    Family history of colon cancer    Family history of Hodgkin's disease    Family history of ovarian cancer    GERD (gastroesophageal reflux disease)    History of echocardiogram    a. 08/2020 Echo: EF 60-65%, no rwma. RVSP 37.42mHg. Mod dil LA. Mild to mod MR/TR. Mild AoV sclerosis.   HLD (hyperlipidemia)    Hypertension    Hypothyroidism    Leg pain    a. 08/2020 ABIs: R 1.19, L 0.97.   [place]ymph[place] ([place] 12[place]   RADIATION RIGHT LEG    MRSA infection    remote history; posterior torso   Multiple thyroid nodules    Permanent atrial fibrillation ([place]    a. Dx 10/2016; b. 01/2017 s/p DCCV-->failed-->rate controlled; c. CHA2DS2VASc = 5-->eliquis.   Personal history of radiation therapy 12/2017   lymphoma   Personal history of radiation therapy 2022   Breast Cancer   Past Surgical History:  Procedure Laterality Date   BONE BIOPSY     2018   BREAST BIOPSY Right 02/26/2021   ffirm bx-"X" clip-path pending   BREAST LUMPECTOMY Right 03/09/2021   Procedure: BREAST LUMPECTOMY;  Surgeon: Robert Bellow, MD;  Location: [facility];  Service: General;  Laterality: Right;   CARDIOVERSION N/A 01/20/2017   Procedure: CARDIOVERSION;  Surgeon: Minna Merritts, MD;  Location: [facility];  Service: Cardiovascular;  Laterality: N/A;   CARDIOVERSION     AFIB 2018   CATARACT EXTRACTION W/PHACO Right 02/15/2018   Procedure: CATARACT EXTRACTION PHACO AND INTRAOCULAR LENS PLACEMENT ([place]);  Surgeon: Birder Robson, MD;  Location: [facility];  Service: Ophthalmology;  Laterality: Right;  [place] 01:22.8 AP% 18.9 CDE 15.62 Fluid Pack Lot # [id number]   CATARACT EXTRACTION W/PHACO Left 03/08/2018   Procedure: CATARACT EXTRACTION PHACO AND INTRAOCULAR LENS PLACEMENT ([place]);  Surgeon: Birder Robson, MD;   Location: [facility];  Service: Ophthalmology;  Laterality: Left;  [place] 01:05 AP% 15.8 CDE 10.28 Fluid pak lot # [id number]   COLONOSCOPY     EYE SURGERY       Current Meds  Medication Sig   acetaminophen (TYLENOL) 500 MG tablet Take 500 mg by mouth at bedtime.   cefUROXime (CEFTIN) 250 MG tablet Take 250 mg by mouth 2 (two) times daily.   CRANBERRY PO Take 1 capsule by mouth 2 (two) times daily.    cyanocobalamin (,VITAMIN B-12,) 1000 MCG/ML injection Inject 1,000 mcg into the muscle every 30 (thirty) days.    diltiazem (CARDIZEM CD) 180 MG 24 hr capsule Take 1 capsule (180 mg total) by mouth 2 (two) times daily.   ELIQUIS 5 MG TABS tablet TAKE 1 TABLET BY MOUTH TWICE A DAY   furosemide (LASIX) 20 MG tablet TAKE 1 TABLET (20 MG TOTAL) BY MOUTH DAILY AS NEEDED FOR FLUID OR EDEMA (SOB).   metFORMIN (GLUCOPHAGE) 500 MG tablet Take 500 mg by mouth 2 (two) times daily with a meal.   olmesartan (BENICAR) 40 MG tablet Take 1 tablet (40 mg total) by mouth daily.   omeprazole (PRILOSEC) 20 MG capsule Take 1 capsule (20 mg total) by mouth daily.   Trolamine Salicylate (BLUE-EMU HEMP EX) Apply 1 application topically daily as needed (Knee pain).     Allergies:   Nitrofurantoin and Sulfa antibiotics   Social History   Tobacco Use   Smoking status: Never   Smokeless tobacco: Never  Substance Use Topics   Alcohol use: No   Drug use: No      Family Hx: The patient's family history includes Arrhythmia in her sister; Breast cancer (age of onset: 62) in her sister; Heart disease in her brother; Ovarian cancer in her maternal aunt; Stroke in her sister. There is no history of Kidney cancer or Bladder Cancer.  ROS:   Please see the history of present illness.    Review of Systems  Constitutional: Negative.   HENT: Negative.    Respiratory: Negative.    Cardiovascular: Negative.   Gastrointestinal: Negative.   Musculoskeletal: Negative.        Leg weakness  Neurological: Negative.    Psychiatric/Behavioral: Negative.    All other systems reviewed and are negative.   Labs/Other Tests and Data Reviewed:    Recent Labs: No results found for requested labs within last 365 days.   Recent Lipid Panel No results found for: "CHOL", "TRIG", "HDL", "CHOLHDL", "LDLCALC", "LDLDIRECT"  Wt Readings from Last 3 Encounters:  07/27/22 172 lb 6.4 oz (78.2 kg)  05/19/22 175 lb 6.4 oz (79.6 kg)  11/19/21 172 lb 6.4 oz (78.2 kg)     Exam:    Vital Signs: BP 122/70 (BP Location: Left Arm, Patient Position: Sitting, Cuff  Size: Normal)   Pulse 96   Ht '5\' 6"'$  (1.676 m)   Wt 172 lb 6.4 oz (78.2 kg)   SpO2 97%   BMI 27.83 kg/m   Constitutional:  oriented to person, place, and time. No distress.  HENT:  Head: Grossly normal Eyes:  no discharge. No scleral icterus.  Neck: No JVD, no carotid bruits  Cardiovascular: irreg irreg, no murmurs appreciated Pulmonary/Chest: Clear to auscultation bilaterally, no wheezes or rails Abdominal: Soft.  no distension.  no tenderness.  Musculoskeletal: Normal range of motion Neurological:  normal muscle tone. Coordination normal. No atrophy Skin: Skin warm and dry Psychiatric: normal affect, pleasant   ASSESSMENT & PLAN:     Paroxysmal atrial fibrillation (HCC) - Continue diltiazem, rate well controlled Could cut back on dosing if needed for lower extremity edema   Essential hypertension Blood pressure well controlled, no changes made Potentially could decrease diltiazem if leg swelling gets worse, she has very sensitive/tender legs Would need to go up on losartan potentially   Unsteady gait Previously completed PT Legs are weak after she broke her arm October 2019 Doing better, independent Recommend regular walking program   Hyperlipidemia Previously not inclined to be on a cholesterol medication   Total encounter time more than 25 minutes  Greater than 50% was spent in counseling and coordination of care with the  patient   Signed, Ida Rogue, MD  07/27/2022 3:06 PM    [place] Office [address], [place], [place] [address]

## 2022-07-27 NOTE — Patient Instructions (Signed)
Medication Instructions:  No changes  If you need a refill on your cardiac medications before your next appointment, please call your pharmacy.   Lab work: No new labs needed  Testing/Procedures: No new testing needed  Follow-Up: At [facility], you and your health needs are our priority.  As part of our continuing mission to provide you with exceptional heart care, we have created designated Provider Care Teams.  These Care Teams include your primary Cardiologist (physician) and Advanced Practice Providers (APPs -  Physician Assistants and Nurse Practitioners) who all work together to provide you with the care you need, when you need it.  You will need a follow up appointment in 12 months  Providers on your designated Care Team:   Christopher Berge, NP Ryan Dunn, PA-C Cadence Furth, PA-C  COVID-19 Vaccine Information can be found at: [email or web address] For questions related to vaccine distribution or appointments, please email [email or web address] or call [phone].   

## 2022-08-18 ENCOUNTER — Ambulatory Visit: Payer: Medicare Other | Admitting: Urology

## 2022-08-24 ENCOUNTER — Encounter: Payer: Self-pay | Admitting: Urology

## 2022-08-24 ENCOUNTER — Ambulatory Visit (INDEPENDENT_AMBULATORY_CARE_PROVIDER_SITE_OTHER): Payer: Medicare Other | Admitting: Urology

## 2022-08-24 VITALS — BP 136/82 | HR 86 | Ht 66.0 in | Wt 172.0 lb

## 2022-08-24 DIAGNOSIS — R339 Retention of urine, unspecified: Secondary | ICD-10-CM

## 2022-08-24 DIAGNOSIS — Z87440 Personal history of urinary (tract) infections: Secondary | ICD-10-CM | POA: Diagnosis not present

## 2022-08-24 DIAGNOSIS — N390 Urinary tract infection, site not specified: Secondary | ICD-10-CM

## 2022-08-24 LAB — URINALYSIS, COMPLETE
Bilirubin, UA: NEGATIVE
Glucose, UA: NEGATIVE
Ketones, UA: NEGATIVE
Leukocytes,UA: NEGATIVE
Nitrite, UA: NEGATIVE
Protein,UA: NEGATIVE
RBC, UA: NEGATIVE
Specific Gravity, UA: 1.015 (ref 1.005–1.030)
Urobilinogen, Ur: 0.2 mg/dL (ref 0.2–1.0)
pH, UA: 7 (ref 5.0–7.5)

## 2022-08-24 LAB — MICROSCOPIC EXAMINATION

## 2022-08-24 LAB — BLADDER SCAN AMB NON-IMAGING: Scan Result: 231

## 2022-08-24 NOTE — Progress Notes (Signed)
08/24/2022 1:38 PM   Barbara Mooney Dec 30, 1933 [id number]  Referring provider: Marinda Elk, MD [facility] [place],  [place] [address]  Chief Complaint  Patient presents with   Urinary Tract Infection    HPI: 88 year old female who presents today for further evaluation of recurrent UTIs.  She was last seen in 2019 for the same issue.  At that time, she underwent cystoscopy which was unremarkable.  Renal ultrasound was negative.  She was prescribed estrogen cream along with advised to start daily probiotics and cranberry tablets.  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  She underwent lumpectomy for this followed by radiation.  She elected not to pursue endocrine therapy given her age and comorbidities.  Over the past calendar year, she had an E. coli urinary tract infection on 12/09/2021, 12/21/2021, 01/28/2022, 02/25/2022, 05/05/2022, 06/09/2022, and 07/23/2022.  Most recent culture was resistant to Bactrim Cipro and ampicillin.  She was last treated with Ceftin.  Patient presents today with her daughter.  She reports that these urine checks were mostly driven at the request of her daughter.  She did notice her mom have any signs or symptoms of infection with deviations from her baseline urinary symptoms.  No dysuria, gross hematuria, fevers or chills.  Her daughter does mention she had an episode back in 2017 where she did not have any symptoms and ultimately developed lethargy.  At that time, she was diagnosed with sepsis related to UTI.   Since being seen in 2019, she has not really had an issue with UTI but her daughter mentions that they have not been checking until this past year.  No bowel issues, diarrhea or abdominal pain.  No recent cross-sectional imaging.  PMH: Past Medical History:  Diagnosis Date   Arthritis    Breast cancer ([place]) 03/09/2021   Right Breast    Bruises easily    Current use of long term anticoagulation    Apixaban   Diabetes mellitus    Edema    FEET/ LEGS   Family history of breast cancer    Family history of colon cancer    Family history of Hodgkin's disease    Family history of ovarian cancer    GERD (gastroesophageal reflux disease)    History of echocardiogram    a. 08/2020 Echo: EF 60-65%, no rwma. RVSP 37.38mHg. Mod dil LA. Mild to mod MR/TR. Mild AoV sclerosis.   HLD (hyperlipidemia)    Hypertension    Hypothyroidism    Leg pain    a. 08/2020 ABIs: R 1.19, L 0.97.   [place]   RADIATION RIGHT LEG    MRSA infection    remote history; posterior torso   Multiple thyroid nodules    Permanent atrial fibrillation ([place]    a. Dx 10/2016; b. 01/2017 s/p DCCV-->failed-->rate controlled; c. CHA2DS2VASc = 5-->eliquis.   Personal history of radiation therapy 12/2017   lymphoma   Personal history of radiation therapy 2022   Breast Cancer    Surgical History: Past Surgical History:  Procedure Laterality Date   BONE BIOPSY     2018   BREAST BIOPSY Right 02/26/2021   ffirm bx-"X" clip-path pending   BREAST LUMPECTOMY Right 03/09/2021   Procedure: BREAST LUMPECTOMY;  Surgeon: BRobert Bellow MD;  Location: [facility] ORS;  Service: General;  Laterality: Right;   CARDIOVERSION N/A 01/20/2017   Procedure: CARDIOVERSION;  Surgeon: TMinna Merritts  MD;  Location: [facility] ORS;  Service: Cardiovascular;  Laterality: N/A;   CARDIOVERSION     AFIB 2018   CATARACT EXTRACTION W/PHACO Right 02/15/2018   Procedure: CATARACT EXTRACTION PHACO AND INTRAOCULAR LENS PLACEMENT ([place]);  Surgeon: Birder Robson, MD;  Location: [facility] ORS;  Service: Ophthalmology;  Laterality: Right;  [place] 01:22.8 AP% 18.9 CDE 15.62 Fluid Pack Lot # [id number]   CATARACT EXTRACTION W/PHACO Left 03/08/2018   Procedure: CATARACT EXTRACTION PHACO AND INTRAOCULAR LENS PLACEMENT ([place]);  Surgeon: Birder Robson, MD;  Location: [facility] ORS;  Service:  Ophthalmology;  Laterality: Left;  [place] 01:05 AP% 15.8 CDE 10.28 Fluid pak lot # [id number]   COLONOSCOPY     EYE SURGERY      Home Medications:  Allergies as of 08/24/2022       Reactions   Nitrofurantoin Diarrhea, Nausea Only, Other (See Comments)   Sulfa Antibiotics Nausea Only, Other (See Comments)        Medication List        Accurate as of August 24, 2022  1:38 PM. If you have any questions, ask your nurse or doctor.          acetaminophen 500 MG tablet Commonly known as: TYLENOL Take 500 mg by mouth at bedtime.   BLUE-EMU HEMP EX Apply 1 application topically daily as needed (Knee pain).   CRANBERRY PO Take 1 capsule by mouth 2 (two) times daily.   cyanocobalamin 1000 MCG/ML injection Commonly known as: VITAMIN B12 Inject 1,000 mcg into the muscle every 30 (thirty) days.   diltiazem 180 MG 24 hr capsule Commonly known as: CARDIZEM CD Take 1 capsule (180 mg total) by mouth 2 (two) times daily.   Eliquis 5 MG Tabs tablet Generic drug: apixaban TAKE 1 TABLET BY MOUTH TWICE A DAY   furosemide 20 MG tablet Commonly known as: LASIX Take 1 tablet (20 mg total) by mouth daily as needed for fluid or edema (SOB).   metFORMIN 500 MG tablet Commonly known as: GLUCOPHAGE Take 500 mg by mouth 2 (two) times daily with a meal.   olmesartan 40 MG tablet Commonly known as: BENICAR Take 1 tablet (40 mg total) by mouth daily.   omeprazole 20 MG capsule Commonly known as: PRILOSEC Take 1 capsule (20 mg total) by mouth daily.   ondansetron 4 MG tablet Commonly known as: ZOFRAN Take 4 mg by mouth every 8 (eight) hours as needed.        Allergies:  Allergies  Allergen Reactions   Nitrofurantoin Diarrhea, Nausea Only and Other (See Comments)   Sulfa Antibiotics Nausea Only and Other (See Comments)    Family History: Family History  Problem Relation Age of Onset   Breast cancer Sister 66   Heart disease Brother    Ovarian cancer Maternal Aunt         another aunt- colon cancer   Stroke Sister    Arrhythmia Sister        A-Fib   Kidney cancer Neg Hx    Bladder Cancer Neg Hx     Social History:  reports that she has never smoked. She has never used smokeless tobacco. She reports that she does not drink alcohol and does not use drugs.   Physical Exam: BP 136/82   Pulse 86   Ht '5\' 6"'$  (1.676 m)   Wt 172 lb (78 kg)   BMI 27.76 kg/m   Constitutional:  Alert and oriented, No acute distress.  Accompanied by daughter today.  Well-appearing. HEENT: [place]  AT, moist mucus membranes.  Trachea midline, no masses. Neurologic: Grossly intact, no focal deficits, moving all 4 extremities. Psychiatric: Normal mood and affect.   Urinalysis Urinalysis today is negative   Assessment & Plan:    1. Recurrent UTI /chronic bacterial colonization We discussed in the absence of symptoms, its more likely that she is chronically colonized especially that each of the urine cultures grew the same bacteria  If she not having any signs or symptoms of infection, would strongly encourage cessation of checking for UTIs as well as abstaining from treating these  I do understand concerns about development of systemic infection, we discussed possible symptoms such as malaise, confusion, altered mental status, dysuria, frequency, fevers, amongst others which may be more specific for infection.  If she develops any of these, that would be reason to treat.  Given her personal history of breast cancer, would no longer recommend topical estrogen especially in light of probable asymptomatic bacteriuria.  Do agree with daily cranberry tablets, could add daily probiotics as well as d-mannose to her regimen.  With - Urinalysis, Complete - Bladder Scan (Post Void Residual) in office  2.  Incomplete bladder emptying May be contributing factor to the above, encourage double voiding per day as tolerated  Follow-up as needed  Hollice Espy, MD  [facility] [address] [place], [place] [address] [phone]

## 2022-08-24 NOTE — Patient Instructions (Signed)
Take daily cranberry tablet, probiotic, D-Mannose. Double void to empty your bladder.

## 2022-08-28 ENCOUNTER — Encounter: Payer: Self-pay | Admitting: Cardiovascular Disease

## 2022-10-07 ENCOUNTER — Telehealth: Payer: Self-pay | Admitting: Cardiovascular Disease

## 2022-10-07 ENCOUNTER — Other Ambulatory Visit: Payer: Self-pay | Admitting: Cardiovascular Disease

## 2022-10-07 DIAGNOSIS — I480 Paroxysmal atrial fibrillation: Secondary | ICD-10-CM

## 2022-10-07 MED ORDER — APIXABAN 5 MG PO TABS: 5.0000 mg | ORAL_TABLET | Freq: Two times a day (BID) | ORAL | 1 refills | Status: DC

## 2022-10-07 NOTE — Telephone Encounter (Signed)
Refill request

## 2022-10-07 NOTE — Telephone Encounter (Signed)
*  STAT* If patient is at the pharmacy, call can be transferred to refill team.   1. Which medications need to be refilled? (please list name of each medication and dose if known) ELIQUIS 5 MG TABS tablet  2. Which pharmacy/location (including street and city if local pharmacy) is medication to be sent to? [id number]. Do they need a 30 day or 90 day supply? 90 day  Patient has enough to last through Saturday

## 2022-10-07 NOTE — Telephone Encounter (Signed)
Prescription refill request for Eliquis received. Indication: Afib  Last office visit: 07/27/22 Rockey Situ)  Scr: 1.1 (07/22/22)  Age: 88  Weight: 78kg  Appropriate dose and refill sent to requested pharmacy.

## 2023-01-18 ENCOUNTER — Other Ambulatory Visit: Payer: Self-pay | Admitting: General Surgery

## 2023-01-18 DIAGNOSIS — Z853 Personal history of malignant neoplasm of breast: Secondary | ICD-10-CM

## 2023-01-18 DIAGNOSIS — Z1231 Encounter for screening mammogram for malignant neoplasm of breast: Secondary | ICD-10-CM

## 2023-02-25 ENCOUNTER — Ambulatory Visit
Admission: RE | Admit: 2023-02-25 | Discharge: 2023-02-25 | Disposition: A | Discharge status: Home / Self Care | Payer: Medicare Other | Source: Ambulatory Visit | Attending: General Surgery | Admitting: General Surgery

## 2023-02-25 DIAGNOSIS — Z853 Personal history of malignant neoplasm of breast: Secondary | ICD-10-CM | POA: Diagnosis present

## 2023-04-02 ENCOUNTER — Encounter: Payer: Self-pay | Admitting: Cardiovascular Disease

## 2023-04-04 ENCOUNTER — Other Ambulatory Visit: Payer: Self-pay | Admitting: Cardiovascular Disease

## 2023-04-04 DIAGNOSIS — I480 Paroxysmal atrial fibrillation: Secondary | ICD-10-CM

## 2023-04-04 NOTE — Telephone Encounter (Signed)
Prescription refill request for Eliquis received. Indication: PAF Last office visit: 07/27/22  Concha Se MD Scr:0.9 on 01/28/23 Age: 89 Weight: 78.2kg  Based on above findings Eliquis  twice daily is the appropriate dose.  Refill approved.

## 2023-04-04 NOTE — Telephone Encounter (Signed)
Refill Request.  

## 2023-05-17 ENCOUNTER — Encounter: Payer: Self-pay | Admitting: Cardiovascular Disease

## 2023-05-25 DIAGNOSIS — C541 Malignant neoplasm of endometrium: Secondary | ICD-10-CM

## 2023-05-25 HISTORY — DX: Malignant neoplasm of endometrium: C54.1

## 2023-06-01 ENCOUNTER — Encounter: Payer: Self-pay | Admitting: Radiation Oncology

## 2023-06-01 ENCOUNTER — Ambulatory Visit
Admission: RE | Admit: 2023-06-01 | Discharge: 2023-06-01 | Disposition: A | Discharge status: Home / Self Care | Payer: Medicare Other | Source: Ambulatory Visit | Attending: Radiation Oncology | Admitting: Radiation Oncology

## 2023-06-01 VITALS — BP 126/62 | HR 59 | Temp 97.9°F | Resp 16 | Wt 165.0 lb

## 2023-06-01 DIAGNOSIS — Z86000 Personal history of in-situ neoplasm of breast: Secondary | ICD-10-CM | POA: Insufficient documentation

## 2023-06-01 DIAGNOSIS — Z923 Personal history of irradiation: Secondary | ICD-10-CM | POA: Insufficient documentation

## 2023-06-01 DIAGNOSIS — D0511 Intraductal carcinoma in situ of right breast: Secondary | ICD-10-CM

## 2023-06-01 NOTE — Progress Notes (Signed)
Radiation Oncology Follow up Note  Name: Barbara Mooney   Date:   06/01/2023 MRN:  [id number] DOB: 1934-11-06    This 89 y.o. 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  REFERRING PROVIDER: Patrice Paradise, MD  HPI: Patient is an 89 year old female now out 2 years having completed whole breast radiation to her right breast for high-grade ductal carcinoma in situ.  Seen today in routine follow-up she is doing well she feels a little nodule in her right breast which is fleshy well-circumscribed compatible with scar tissue or fat necrosis..  She had mammograms back in March which I have reviewed were BI-RADS 2 benign.  She does not take endocrine therapy.  COMPLICATIONS OF TREATMENT: none  FOLLOW UP COMPLIANCE: keeps appointments   PHYSICAL EXAM:  BP 126/62   Pulse (!) 59   Temp 97.9 F (36.6 C) (Tympanic)   Resp 16   Wt 165 lb (74.8 kg)   BMI 26.63 kg/m  Right breast has no dominant mass.  Small fleshy well-circumscribed nodule in her scar compatible with fat necrosis not indicative of malignancy.  There is some distortion of the breast from her surgical scar.  Left breast is free of dominant mass no axillary or supraclavicular adenopathy is appreciated.  Well-developed well-nourished patient in NAD. HEENT reveals PERLA, EOMI, discs not visualized.  Oral cavity is clear. No oral mucosal lesions are identified. Neck is clear without evidence of cervical or supraclavicular adenopathy. Lungs are clear to A&P. Cardiac examination is essentially unremarkable with regular rate and rhythm without murmur rub or thrill. Abdomen is benign with no organomegaly or masses noted. Motor sensory and DTR levels are equal and symmetric in the upper and lower extremities. Cranial nerves II through XII are grossly intact. Proprioception is intact. No peripheral adenopathy or edema is  identified. No motor or sensory levels are noted. Crude visual fields are within normal range.  RADIOLOGY RESULTS: Mammograms reviewed compatible with above-stated findings  PLAN: Present time patient is doing well no evidence of disease now out over 2 years.  I have asked to see her back in 1 year for follow-up.  Mammograms will be ordered by her family doctor.  Patient is to call with any concerns.  I would like to take this opportunity to thank you for allowing me to participate in the care of your patient.Carmina Miller, MD

## 2023-06-15 ENCOUNTER — Ambulatory Visit: Payer: Medicare Other

## 2023-06-22 ENCOUNTER — Inpatient Hospital Stay: Payer: Medicare Other | Attending: Obstetrics and Gynecology | Admitting: Obstetrics and Gynecology

## 2023-06-22 ENCOUNTER — Inpatient Hospital Stay: Payer: Medicare Other

## 2023-06-22 ENCOUNTER — Encounter: Payer: Self-pay | Admitting: Cardiovascular Disease

## 2023-06-22 ENCOUNTER — Encounter: Payer: Self-pay | Admitting: Obstetrics and Gynecology

## 2023-06-22 VITALS — BP 143/77 | HR 83 | Temp 97.6°F | Resp 17 | Wt 167.2 lb

## 2023-06-22 DIAGNOSIS — E1122 Type 2 diabetes mellitus with diabetic chronic kidney disease: Secondary | ICD-10-CM | POA: Insufficient documentation

## 2023-06-22 DIAGNOSIS — I129 Hypertensive chronic kidney disease with stage 1 through stage 4 chronic kidney disease, or unspecified chronic kidney disease: Secondary | ICD-10-CM | POA: Insufficient documentation

## 2023-06-22 DIAGNOSIS — Z79899 Other long term (current) drug therapy: Secondary | ICD-10-CM | POA: Insufficient documentation

## 2023-06-22 DIAGNOSIS — E782 Mixed hyperlipidemia: Secondary | ICD-10-CM | POA: Insufficient documentation

## 2023-06-22 DIAGNOSIS — Z800 Family history of malignant neoplasm of digestive organs: Secondary | ICD-10-CM | POA: Diagnosis not present

## 2023-06-22 DIAGNOSIS — Z807 Family history of other malignant neoplasms of lymphoid, hematopoietic and related tissues: Secondary | ICD-10-CM | POA: Diagnosis not present

## 2023-06-22 DIAGNOSIS — Z7901 Long term (current) use of anticoagulants: Secondary | ICD-10-CM | POA: Insufficient documentation

## 2023-06-22 DIAGNOSIS — Z8572 Personal history of non-Hodgkin lymphomas: Secondary | ICD-10-CM | POA: Diagnosis not present

## 2023-06-22 DIAGNOSIS — Z923 Personal history of irradiation: Secondary | ICD-10-CM | POA: Diagnosis not present

## 2023-06-22 DIAGNOSIS — M8580 Other specified disorders of bone density and structure, unspecified site: Secondary | ICD-10-CM | POA: Diagnosis not present

## 2023-06-22 DIAGNOSIS — E118 Type 2 diabetes mellitus with unspecified complications: Secondary | ICD-10-CM

## 2023-06-22 DIAGNOSIS — Z803 Family history of malignant neoplasm of breast: Secondary | ICD-10-CM | POA: Insufficient documentation

## 2023-06-22 DIAGNOSIS — Z7984 Long term (current) use of oral hypoglycemic drugs: Secondary | ICD-10-CM | POA: Insufficient documentation

## 2023-06-22 DIAGNOSIS — K219 Gastro-esophageal reflux disease without esophagitis: Secondary | ICD-10-CM | POA: Insufficient documentation

## 2023-06-22 DIAGNOSIS — Z8041 Family history of malignant neoplasm of ovary: Secondary | ICD-10-CM | POA: Diagnosis not present

## 2023-06-22 DIAGNOSIS — D0511 Intraductal carcinoma in situ of right breast: Secondary | ICD-10-CM | POA: Insufficient documentation

## 2023-06-22 DIAGNOSIS — C541 Malignant neoplasm of endometrium: Secondary | ICD-10-CM

## 2023-06-22 DIAGNOSIS — E119 Type 2 diabetes mellitus without complications: Secondary | ICD-10-CM | POA: Diagnosis not present

## 2023-06-22 DIAGNOSIS — I4821 Permanent atrial fibrillation: Secondary | ICD-10-CM | POA: Diagnosis not present

## 2023-06-22 DIAGNOSIS — E039 Hypothyroidism, unspecified: Secondary | ICD-10-CM | POA: Diagnosis not present

## 2023-06-22 DIAGNOSIS — Z7189 Other specified counseling: Secondary | ICD-10-CM

## 2023-06-22 DIAGNOSIS — N189 Chronic kidney disease, unspecified: Secondary | ICD-10-CM | POA: Diagnosis not present

## 2023-06-22 LAB — COMPREHENSIVE METABOLIC PANEL
ALT: 12 U/L (ref 0–44)
AST: 17 U/L (ref 15–41)
Albumin: 4 g/dL (ref 3.5–5.0)
Alkaline Phosphatase: 118 U/L (ref 38–126)
Anion gap: 12 (ref 5–15)
BUN: 16 mg/dL (ref 8–23)
CO2: 28 mmol/L (ref 22–32)
Calcium: 9.6 mg/dL (ref 8.9–10.3)
Chloride: 100 mmol/L (ref 98–111)
Creatinine, Ser: 0.85 mg/dL (ref 0.44–1.00)
GFR, Estimated: >60 mL/min (ref >60)
Glucose, Bld: 104 mg/dL — ABNORMAL HIGH (ref 70–99)
Potassium: 4 mmol/L (ref 3.5–5.1)
Sodium: 140 mmol/L (ref 135–145)
Total Bilirubin: 0.4 mg/dL (ref 0.3–1.2)
Total Protein: 6.9 g/dL (ref 6.5–8.1)

## 2023-06-22 LAB — CBC WITH DIFFERENTIAL/PLATELET
Abs Immature Granulocytes: 0.03 10*3/uL (ref 0.00–0.07)
Basophils Absolute: 0 10*3/uL (ref 0.0–0.1)
Basophils Relative: 1 %
Eosinophils Absolute: 0.2 10*3/uL (ref 0.0–0.5)
Eosinophils Relative: 2 %
HCT: 39.3 % (ref 36.0–46.0)
Hemoglobin: 12.4 g/dL (ref 12.0–15.0)
Immature Granulocytes: 0 %
Lymphocytes Relative: 15 %
Lymphs Abs: 1.2 10*3/uL (ref 0.7–4.0)
MCH: 27.5 pg (ref 26.0–34.0)
MCHC: 31.6 g/dL (ref 30.0–36.0)
MCV: 87.1 fL (ref 80.0–100.0)
Monocytes Absolute: 0.6 10*3/uL (ref 0.1–1.0)
Monocytes Relative: 7 %
Neutro Abs: 6 10*3/uL (ref 1.7–7.7)
Neutrophils Relative %: 75 %
Platelets: 298 10*3/uL (ref 150–400)
RBC: 4.51 MIL/uL (ref 3.87–5.11)
RDW: 16.1 % — ABNORMAL HIGH (ref 11.5–15.5)
WBC: 8 10*3/uL (ref 4.0–10.5)
nRBC: 0 % (ref 0.0–0.2)

## 2023-06-22 LAB — HEMOGLOBIN A1C
Hgb A1c MFr Bld: 6.7 % — ABNORMAL HIGH (ref 4.8–5.6)
Mean Plasma Glucose: 145.59 mg/dL

## 2023-06-22 LAB — TSH: TSH: 1.437 u[IU]/mL (ref 0.350–4.500)

## 2023-06-22 MED ORDER — METRONIDAZOLE 500 MG/100ML IV SOLN: 500.0000 mg | Freq: Once | INTRAVENOUS | Status: DC

## 2023-06-22 NOTE — Progress Notes (Signed)
Gynecologic Oncology Consult Visit   Referring Provider: Dr. Dalbert Garnet  Chief Concern: Endometrial  Subjective:  Barbara Mooney is a 89 y.o. female who is seen in consultation from Dr. Dalbert Garnet for newly diagnosed endometrial cancer.   Patient experienced PMB which prompted ultrasound.   - Uterus: 8.39 x 4.41 x 6.02 cm  - Ovaries: bilaterally wnl  - Other: 27 mm endometrial lining, 2.8 cm posterior fibroid. The uterus had a maximal endometrial thickness of 27 mm. The endometrial contour appeared irregular and generally thickened. Endometrial findings were unable to be fully examined due to the large, solid mass .   05/25/23- Pap NILM  She underwent endometrial biopsy.  WELL DIFFERENTIATED ENDOMETRIOID CARCINOMA (FIGO I).  - Loss of MLH1 and PMS2. MSH2 and MSH6 intact.    She had possible rectal prolapse on exam and was referred to GI. She takes Eliquis for history of a fib.   She has a history of Breast DCIS and Non Hodgkins Lymphoma.   # Right lower inner biopsy- high grade DCIS, lobular neoplasia, calcifications. She underwent lumpectomy with high grade dcis, PLCIS, lobular neoplasia. Followed by radiation x 3 weeks with boost.    # Right LE Non-hodgkin follicular lymphoma s/p surgery at [facility] and RT with Dr Rushie Chestnut in 2018.  She has had radiation to the right lower extremity.  Genetic Testing: 08/26/21- [facility] Multi-cancer panel was negative.   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    Bradycardia 02/01/2019   Closed Colles' fracture 09/23/2018   Follicular non-Hodgkin's lymphoma of bone (HCC) 11/16/2017   Tibial mass 10/13/2017   Malignant neoplasm of right tibia (HCC) 10/07/2017   Type 2 diabetes mellitus with complication, with long-term current use of insulin (HCC) 08/07/2017   Complete tear of right rotator cuff 05/16/2017   Rotator cuff tendinitis, right 05/16/2017   Encounter for anticoagulation discussion and counseling 02/08/2017   Leg swelling 02/08/2017   Non-Hodgkin lymphoma (HCC) 2018   Unsteady gait    Anemia    Paroxysmal atrial fibrillation (HCC)    Essential hypertension    Sepsis (HCC) 10/28/2016   Degenerative tear of lateral meniscus, left 08/09/2016   Primary osteoarthritis of left knee 08/09/2016   Varicose veins of both legs with edema 03/17/2016   Type 2 diabetes mellitus with stage 3 chronic kidney disease, without long-term current use of insulin (HCC) 01/27/2016   Mixed incontinence 06/11/2015   External hemorrhoid 03/11/2015   Gastroesophageal reflux disease without esophagitis 03/11/2015   Onychomycosis of toenail 03/11/2015   Knee strain, right, subsequent encounter 02/28/2015   Primary osteoarthritis of right knee 02/28/2015   Vitamin B 12 deficiency 06/13/2014   Multinodular goiter (nontoxic) 09/07/2011    Past Medical History: Past Medical History:  Diagnosis Date   Arthritis    Atrial fibrillation (HCC)    Breast cancer (HCC) 03/09/2021   Right Breast   Bruises easily    Current use of long term anticoagulation    Apixaban   Diabetes mellitus    Edema  FEET/ LEGS   Family history of breast cancer    Family history of colon cancer    Family history of Hodgkin's disease    Family history of ovarian cancer    GERD (gastroesophageal reflux disease)    History of echocardiogram    a. 08/2020 Echo: EF 60-65%, no rwma. RVSP 37.45mmHg. Mod dil LA. Mild to mod MR/TR. Mild AoV sclerosis.   HLD (hyperlipidemia)     Hypertension    Hypothyroidism    Leg pain    a. 08/2020 ABIs: R 1.19, L 0.97.   Lymphoma (HCC) 12/2017   RADIATION RIGHT LEG    MRSA infection    remote history; posterior torso   Multiple thyroid nodules    Non-Hodgkin lymphoma (HCC) 2018   Permanent atrial fibrillation (HCC)    a. Dx 10/2016; b. 01/2017 s/p DCCV-->failed-->rate controlled; c. CHA2DS2VASc = 5-->eliquis.   Personal history of radiation therapy 12/2017   lymphoma   Personal history of radiation therapy 2022   Breast Cancer    Past Surgical History: Past Surgical History:  Procedure Laterality Date   BONE BIOPSY     2018   BREAST BIOPSY Right 02/26/2021   ffirm bx-"X" clip-HIGH-GRADE DUCTAL CARCINOMA IN SITU   BREAST LUMPECTOMY Right 03/09/2021   Procedure: BREAST LUMPECTOMY;  Surgeon: Earline Mayotte, MD;  Location: [facility] OR[facility];  Service: General;  Laterality: Right;   CARDIOVERSION N/A 01/20/2017   Procedure: CARDIOVERSION;  Surgeon: Antonieta Iba, MD;  Location: [facility];  Service: Cardiovascular;  Laterality: N/A;   CARDIOVERSION     AFIB 2018   CATARACT EXTRACTION W/PHACO Right 02/15/2018   Procedure: CATARACT EXTRACTION PHACO AND INTRAOCULAR LENS PLACEMENT (IOC);  Surgeon: Galen Manila, MD;  Location: [facility];  Service: Ophthalmology;  Laterality: Right;  [place] 01:22.8 AP% 18.9 CDE 15.62 Fluid Pack Lot # [id number]   CATARACT EXTRACTION W/PHACO Left 03/08/2018   Procedure: CATARACT EXTRACTION PHACO AND INTRAOCULAR LENS PLACEMENT (IOC);  Surgeon: Galen Manila, MD;  Location: [facility];  Service: Ophthalmology;  Laterality: Left;  [place] 01:05 AP% 15.8 CDE 10.28 Fluid pak lot # [id number]   COLONOSCOPY     EYE SURGERY      Past Gynecologic History:  Per HPI  OB History:  OB History  Gravida Para Term Preterm AB Living  2         2  SAB IAB Ectopic Multiple Live Births               # Outcome Date GA Lbr Len/2nd Weight Sex Delivery Anes PTL Lv  2 Gravida           1 Gravida              Family History: Family History  Problem Relation Age of Onset   Breast cancer Sister 71   Heart disease Brother    Ovarian cancer Maternal Aunt        another aunt- colon cancer   Stroke Sister    Arrhythmia Sister        A-Fib   Kidney cancer Neg Hx    Bladder Cancer Neg Hx     Social History: Social History   Socioeconomic History   Marital status: Widowed    Spouse name: Not on file   Number of children: Not on file   Years of education: Not on file   Highest education level: Not on file  Occupational History   Not on file  Tobacco Use   Smoking status:  Never   Smokeless tobacco: Never  Vaping Use   Vaping Use: Never used  Substance and Sexual Activity   Alcohol use: No   Drug use: No   Sexual activity: Not Currently  Other Topics Concern   Not on file  Social History Narrative   Lives by self; son lives about 2 miles. Never smoked; no alcohol. Used to [employer] Strain: Not on [employer] Insecurity: Not on file  Transportation Needs: Not on file  Physical Activity: Not on file  Stress: Not on file  Social Connections: Not on file  Intimate Partner Violence: Not on file    Allergies: Allergies  Allergen Reactions   Nitrofurantoin Diarrhea, Nausea Only and Other (See Comments)   Sulfa Antibiotics Nausea Only and Other (See Comments)    Current Medications: Current Outpatient Medications  Medication Sig Dispense Refill   acetaminophen (TYLENOL) 500 MG tablet Take 500 mg by mouth at bedtime.     CRANBERRY PO Take 1 capsule by mouth 2 (two) times daily.      cyanocobalamin (,VITAMIN B-12,) 1000 MCG/ML injection Inject 1,000 mcg into the muscle every 30 (thirty) days.      diltiazem (CARDIZEM CD) 180 MG 24 hr capsule Take 1 capsule (180 mg total) by mouth 2 (two) times daily. 180 capsule 3   ELIQUIS 5 MG TABS tablet TAKE 1 TABLET BY MOUTH TWICE A DAY 180 tablet 1   furosemide (LASIX) 20 MG tablet Take  1 tablet (20 mg total) by mouth daily as needed for fluid or edema (SOB). 90 tablet 3   LACTOBACILLUS PO Take by mouth.     metFORMIN (GLUCOPHAGE) 500 MG tablet Take 500 mg by mouth 2 (two) times daily with a meal.     olmesartan (BENICAR) 40 MG tablet Take 1 tablet (40 mg total) by mouth daily. 90 tablet 3   omeprazole (PRILOSEC) 20 MG capsule Take 1 capsule (20 mg total) by mouth daily. 30 capsule 6   ondansetron (ZOFRAN) 4 MG tablet Take 4 mg by mouth every 8 (eight) hours as needed.     Trolamine Salicylate (BLUE-EMU HEMP EX) Apply 1 application topically daily as needed (Knee pain).     No current facility-administered medications for this visit.   Review of Systems  General: negative for fevers, changes in weight or night sweats Skin: negative for changes in moles or sores or rash Eyes: negative for changes in vision HEENT: negative for change in hearing, tinnitus, voice changes Pulmonary: negative for dyspnea, orthopnea, productive cough, wheezing Cardiac: negative for palpitations, pain Gastrointestinal: negative for nausea, vomiting, constipation, diarrhea, hematemesis, hematochezia Genitourinary/Sexual: negative for dysuria, retention, hematuria, incontinence Ob/Gyn: abnormal bleeding Musculoskeletal: pain in the legs and weakness Hematology: negative for easy bruising, abnormal bleeding Neurologic/Psych: negative for headaches, seizures, paralysis, numbness  Objective:  Physical Examination:  BP (!) 143/77   Pulse 83   Temp 97.6 F (36.4 C)   Resp 17   Wt 167 lb 3.2 oz (75.8 kg)   BMI 26.99 kg/m    ECOG Performance Status: 1 - Symptomatic but completely ambulatory  GENERAL: Patient is an elderly appearing in no acute distress HEENT:  Sclera clear. Anicteric NODES:  Negative axillary, supraclavicular, inguinal lymph node survery LUNGS:  Clear to auscultation bilaterally.   HEART:  Regular rate and rhythm.  ABDOMEN:  Soft, nontender.  No hernias, incisions well  healed. No masses or ascites EXTREMITIES:  No peripheral edema. Atraumatic.  No cyanosis SKIN:  Clear with no obvious rashes or skin changes.  NEURO:  Nonfocal. Well oriented.  Appropriate affect.  Pelvic:Exam chaperoned CMA. Vulva: normal appearing vulva with no masses, tenderness or lesions Vagina: normal vagina Adnexa: normal adnexa in size, nontender and no masses Uterus: uterus is normal size, shape, consistency and nontender Cervix: no cervical motion tenderness and no lesions Rectal: not indicated    Lab Review 01/28/2023 Thyroid Stimulating Hormone (TSH) 0.450-5.330 uIU/ml uIU/mL 2.040   HbA1c, CBC, CMP, TSH ordered  Radiologic Imaging: As per HPI    Assessment:  Barbara Mooney is a 89 y.o. 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  Type 2 diabetes mellitus  Hypothyroidism  A-fib on chronic long-term anticoagulation  Personal and family history of malignancy.  Prior genetic testing negative.  Medical co-morbidities complicating care: diabetes. Non-Hodgkin lymphoma and breast cancer (DCIS) status post radiation therapy Plan:   Problem List Items Addressed This Visit   None Visit Diagnoses     Endometrial cancer determined by uterine biopsy (HCC)    -  Primary   Relevant Orders   CBC with Differential/Platelet   Comprehensive metabolic panel   TSH   Hemoglobin A1c   DG Chest 2 View   Counseling and coordination of care           We discussed options for management inclu surgery, radiation or hormonal therapy.  Her performance status is a 1-2 and she does appear to be frail, but overall great health given her age. We reviewed the higher curative rates with surgery, we recommend surgery as long as she is cleared by cardiology and anesthesia with laparoscopic total hysterectomy, bilateral salpingo-oophorectomy, sentinel lymph node injection with ICG, mapping,  biopsy of sentinel lymph nodes, possible node dissection, possible laparotomy on 07/13/2023 with Dr. Johnnette Litter.  We will need to confirm that Dr. Dalbert Garnet is available to assist with surgery.   Risks of surgery were discussed in detail. These include infection, anesthesia, bleeding, transfusion, wound separation, hernia, vaginal cuff dehiscence, medical issues (blood clots, stroke, heart attack, fluid in the lungs, pneumonia, abnormal heart rhythm, death), possible exploratory surgery with larger incision, lymphedema, lymphocyst, allergic reaction, injury to adjacent organs (bowel, bladder, blood vessels, nerves, ureters).  She may be at higher risk for blood clots given her personal history and stressed the importance of early and adequate ambulation.  She will receive DVT/VTE prophylaxis prophylaxis as well as restarting her Eliquis the day after surgery.  We recommend that she stop her Eliquis 3 days before surgery and this will be messaged to her by Consuello Masse.  We just want to confirm this plan with cardiology and also check on her creatinine.   GYN VTE Prophylaxis Risk point calculation: +3  age >74 +1  BMI 25-30 TOTAL: 4   VTE Risk assessment:  This patient has cancer, elevated tumor markers or personal/family hx VTE, and does not have disseminated cancer, or planned open or radical surgery.  The calculated risk score is 0-4.    High Risk:   One pre-op dose UFH, then continuous post-op dosing with LMWH or UFH while inpatient AND SCDs.  We ordered TSH CBC CMP and hemoglobin A1c today.  Chest x-ray obtained to assess for pulmonary mets.  We reached out to cardiology regarding their request for EKG and ECHO or other testing.  We discussed that her pathology will be reviewed after surgery and we can make  a determination if any further treatment may be needed with radiation or chemotherapy.     Suggested return to clinic in  4 -6 weeks after surgery.    The patient's diagnosis, an outline  of the further diagnostic and laboratory studies which will be required, the recommendation, and alternatives were discussed.  All questions were answered to the patient's satisfaction.  A total of 90 minutes were spent with the patient/family today; >50% was spent in education, counseling and coordination of care for endometrial cancer.    Jarelle Ates Leta Jungling, MD    CC:   Dr. Dalbert Garnet

## 2023-06-22 NOTE — Addendum Note (Signed)
Addended by: Ihor Austin D on: 06/22/2023 01:20 PM   Modules accepted: Orders

## 2023-06-22 NOTE — Patient Instructions (Signed)
  DIVISION OF GYNECOLOGIC ONCOLOGY BOWEL PREP   The following instructions are extremely important to prepare for your surgery. Please follow them carefully   Step 1: Liquid Diet Instructions              Clear Liquid Diet for GYN Oncology Patients Day Before Surgery The day before your scheduled surgery DO NOT EAT any solid foods.  We do want you to drink enough liquids, but NO MILK products.  We do not want you to be dehydrated.  Clear liquids are defined as no milk products and no pieces of any solid food. Drink at least 64 oz. of fluid.  The following are all approved for you to drink the day before you surgery. Chicken, Beef or Vegetable Broth (bouillon or consomm) - NO BROTH AFTER MIDNIGHT Plain Jello  (no fruit) Water Strained lemonade or fruit punch Gatorade (any flavor) CLEAR Ensure or Boost Breeze Fruit juices without pulp, such as apple, grape, or cranberry juice Clear sodas - NO SODA AFTER MIDNIGHT Ice Pops without bits of fruit or fruit pulp Honey Tea or coffee without milk or cream                 Any foods not on the above list should be avoided                                                                                             Step 2: Laxatives           The evening before surgery:   Time: around 5pm   Follow these instructions carefully.   Administer 1 Dulcolax suppository according to manufacturer instructions on the box. You will need to purchase this laxative at a pharmacy or grocery store.    Individual responses to laxatives vary; this prep may cause multiple bowel movements. It often works in 30 minutes and may take as long as 3 hours. Stay near an available bathroom.    It is important to stay hydrated. Ensure you are still drinking clear liquids.     IMPORTANT: FOR YOUR SAFETY, WE WILL HAVE TO CANCEL YOUR SURGERY IF YOU DO NOT FOLLOW THESE INSTRUCTIONS.   Do not eat anything after midnight (including gum or candy) prior to your  surgery. Avoid drinking carbonated beverages after midnight. You can have clear liquids up until one hour before you arrive at the hospital. "Nothing by mouth" means no liquids, gum, candy, etc for one hour before your arrival time.      Laparoscopy Laparoscopy is a procedure to diagnose diseases in the abdomen. During the procedure, a thin, lighted, pencil-sized instrument called a laparoscope is inserted into the abdomen through an incision. The laparoscope allows your health care provider to look at the organs inside your body. LET YOUR HEALTH CARE PROVIDER KNOW ABOUT: Any allergies you have. All medicines you are taking, including vitamins, herbs, eye drops, creams, and over-the-counter medicines. Previous problems you or members of your family have had with the use of anesthetics. Any blood disorders you have. Previous surgeries you have had. Medical conditions you have. RISKS AND COMPLICATIONS    Generally, this is a safe procedure. However, problems can occur, which may include: Infection. Bleeding. Damage to other organs. Allergic reaction to the anesthetics used during the procedure. BEFORE THE PROCEDURE Do not eat or drink anything after midnight on the night before the procedure or as directed by your health care provider. Ask your health care provider about: Changing or stopping your regular medicines. Taking medicines such as aspirin and ibuprofen. These medicines can thin your blood. Do not take these medicines before your procedure if your health care provider instructs you not to. Plan to have someone take you home after the procedure. PROCEDURE You may be given a medicine to help you relax (sedative). You will be given a medicine to make you sleep (general anesthetic). Your abdomen will be inflated with a gas. This will make your organs easier to see. Small incisions will be made in your abdomen. A laparoscope and other small instruments will be inserted into the abdomen  through the incisions. A tissue sample may be removed from an organ in the abdomen for examination. The instruments will be removed from the abdomen. The gas will be released. The incisions will be closed with stitches (sutures). AFTER THE PROCEDURE  Your blood pressure, heart rate, breathing rate, and blood oxygen level will be monitored often until the medicines you were given have worn off.   This information is not intended to replace advice given to you by your health care provider. Make sure you discuss any questions you have with your health care provider.                                             Bowel Symptoms After Surgery After gynecologic surgery, women often have temporary changes in bowel function (constipation and gas pain).  Following are tips to help prevent and treat common bowel problems.  It also tells you when to call the doctor.  This is important because some symptoms might be a sign of a more serious bowel problem such as obstruction (bowel blockage).  These problems are rare but can happen after gynecologic surgery.   Besides surgery, what can temporarily affect bowel function? 1. Dietary changes   2. Decreased physical activity   3.Antibiotics   4. Pain medication   How can I prevent constipation (three days or more without a stool)? Include fiber in your diet: whole grains, raw or dried fruits & vegetables, prunes, prune/pear juiceDrink at least 8 glasses of liquid (preferably water) every day Avoid: Gas forming foods such as broccoli, beans, peas, salads, cabbage, sweet potatoes Greasy, fatty, or fried foods Activity helps bowel function return to normal, walk around the house at least 3-4 times each day for 15 minutes or longer, if tolerated.  Rocking in a rocking chair is preferable to sitting still. Stool softeners: these are not laxatives, but serve to soften the stool to avoid straining.  Take 2-4 times a day until normal bowel function returns          Examples: Colace or generic equivalent (Docusate) Bulk laxatives: provide a concentrated source of fiber.  They do not stimulate the bowel.  Take 1-2 times each day until normal bowel function return.              Examples: Citrucel, Metamucil, Fiberal, Fibercon   What can I take for "Gas Pains"? Simethicone (Mylicon, Gas-X, Maalox-Gas, Mylanta-Gas)   take 3-4 times a day Maalox Regular - take 3-4 times a day Mylanta Regular - take 3-4 times a day   What can I take if I become constipated? Start with stool softeners and add additional laxatives below as needed to have a bowel movement every 1-2 days  Stool softeners 1-2 tablets, 2 times a day Senokot 1-2 tablets, 1-2 times a day Glycerin suppository can soften hard stool take once a day Bisacodyl suppository once a day  Milk of Magnesia 30 mL 1-2 times a day Fleets or tap water enema    What can I do for nausea?  Limit most solid foods for 24-48 hours Continue eating small frequent amounts of liquids and/or bland soft foods Toast, crackers, cooked cereal (grits, cream of wheat, rice) Benadryl: a mild anti-nausea medicine can be obtained without a prescription. May cause drowsiness, especially if taken with narcotic pain medicines Contact provider for prescription nausea medication     What can I do, or take for diarrhea (more than five loose stools per day)? Drink plenty of clear fluids to prevent dehydration May take Kaopectate, Pepto-Bismol, Imodium, or probiotics for 1-2 days Anusol or Preparation-H can be helpful for hemorrhoids and irritated tissue around anus   When should I call the doctor?             CONSTIPATION:  Not relieved after three days following the above program VOMITING: That contains blood, "coffee ground" material More the three times/hour and unable to keep down nausea medication for more than eight hours With dry mouth, dark or strong urine, feeling light-headed, dizzy, or confused With severe abdominal pain  or bloating for more than 24 hours DIARRHEA: That continues for more then 24-48 hours despite treatment That contains blood or tarry material With dry mouth, dark or strong urine, feeling light~headed, dizzy, or confused FEVER: 101 F or higher along with nausea, vomiting, gas pain, diarrhea UNABLE TO: Pass gas from rectum for more than 24 hours Tolerate liquids by mouth for more than 24 hours        Laparoscopic Hysterectomy, Care After Refer to this sheet in the next few weeks. These instructions provide you with information on caring for yourself after your procedure. Your health care provider may also give you more specific instructions. Your treatment has been planned according to current medical practices, but problems sometimes occur. Call your health care provider if you have any problems or questions after your procedure. What can I expect after the procedure? Pain and bruising at the incision sites. You will be given pain medicine to control it. Menopausal symptoms such as hot flashes, night sweats, and insomnia if your ovaries were removed. Sore throat from the breathing tube that was inserted during surgery. Follow these instructions at home: Only take over-the-counter or prescription medicines for pain, discomfort, or fever as directed by your health care provider. Do not take aspirin. It can cause bleeding. Do not drive when taking pain medicine. Follow your health care provider's advice regarding diet, exercise, lifting, driving, and general activities. Resume your usual diet as directed and allowed. Get plenty of rest and sleep. Do not douche, use tampons, or have sexual intercourse for at least 6 weeks, or until your health care provider gives you permission. Change your bandages (dressings) as directed by your health care provider. Monitor your temperature and notify your health care provider of a fever. Take showers instead of baths for 2-3 weeks. Do not drink alcohol  until your health care provider   gives you permission. If you develop constipation, you may take a mild laxative with your health care provider's permission. Bran foods may help with constipation problems. Drinking enough fluids to keep your urine clear or pale yellow may help as well. Try to have someone home with you for 1-2 weeks to help around the house. Keep all of your follow-up appointments as directed by your health care provider. Contact a health care provider if: You have swelling, redness, or increasing pain around your incision sites. You have pus coming from your incision. You notice a bad smell coming from your incision. Your incision breaks open. You feel dizzy or lightheaded. You have pain or bleeding when you urinate. You have persistent diarrhea. You have persistent nausea and vomiting. You have abnormal vaginal discharge. You have a rash. You have any type of abnormal reaction or develop an allergy to your medicine. You have poor pain control with your prescribed medicine. Get help right away if: You have chest pain or shortness of breath. You have severe abdominal pain that is not relieved with pain medicine. You have pain or swelling in your legs. This information is not intended to replace advice given to you by your health care provider. Make sure you discuss any questions you have with your health care provider. Document Released: 09/19/2013 Document Revised: 05/06/2016 Document Reviewed: 06/19/2013 [employer] Interactive Patient Education  2017 [employer]                    

## 2023-06-22 NOTE — H&P (Addendum)
Gynecologic Oncology Consult Visit   Referring Provider: Dr. Beasley  Chief Concern: Endometrial  Subjective:  Barbara Mooney is a 89 y.o. female who is seen in consultation from Dr. Beasley for newly diagnosed endometrial cancer.   Patient experienced PMB which prompted ultrasound.   - Uterus: 8.39 x 4.41 x 6.02 cm  - Ovaries: bilaterally wnl  - Other: 27 mm endometrial lining, 2.8 cm posterior fibroid. The uterus had a maximal endometrial thickness of 27 mm. The endometrial contour appeared irregular and generally thickened. Endometrial findings were unable to be fully examined due to the large, solid mass .   05/25/23- Pap NILM  She underwent endometrial biopsy.  WELL DIFFERENTIATED ENDOMETRIOID CARCINOMA (FIGO I).  - Loss of MLH1 and PMS2. MSH2 and MSH6 intact.    She had possible rectal prolapse on exam and was referred to GI. She takes Eliquis for history of a fib.   She has a history of Breast DCIS and Non Hodgkins Lymphoma.   # Right lower inner biopsy- high grade DCIS, lobular neoplasia, calcifications. She underwent lumpectomy with high grade dcis, PLCIS, lobular neoplasia. Followed by radiation x 3 weeks with boost.    # Right LE Non-hodgkin follicular lymphoma s/p surgery at [facility] and RT with Dr Chrystal in 2018.  She has had radiation to the right lower extremity.  Genetic Testing: 08/26/21- [facility] Multi-cancer panel was negative.   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    Bradycardia 02/01/2019   Closed Colles' fracture 09/23/2018   Follicular non-Hodgkin's lymphoma of bone (HCC) 11/16/2017   Tibial mass 10/13/2017   Malignant neoplasm of right tibia (HCC) 10/07/2017   Type 2 diabetes mellitus with complication, with long-term current use of insulin (HCC) 08/07/2017   Complete tear of right rotator cuff 05/16/2017   Rotator cuff tendinitis, right 05/16/2017   Encounter for anticoagulation discussion and counseling 02/08/2017   Leg swelling 02/08/2017   Non-Hodgkin lymphoma (HCC) 2018   Unsteady gait    Anemia    Paroxysmal atrial fibrillation (HCC)    Essential hypertension    Sepsis (HCC) 10/28/2016   Degenerative tear of lateral meniscus, left 08/09/2016   Primary osteoarthritis of left knee 08/09/2016   Varicose veins of both legs with edema 03/17/2016   Type 2 diabetes mellitus with stage 3 chronic kidney disease, without long-term current use of insulin (HCC) 01/27/2016   Mixed incontinence 06/11/2015   External hemorrhoid 03/11/2015   Gastroesophageal reflux disease without esophagitis 03/11/2015   Onychomycosis of toenail 03/11/2015   Knee strain, right, subsequent encounter 02/28/2015   Primary osteoarthritis of right knee 02/28/2015   Vitamin B 12 deficiency 06/13/2014   Multinodular goiter (nontoxic) 09/07/2011    Past Medical History: Past Medical History:  Diagnosis Date   Arthritis    Atrial fibrillation (HCC)    Breast cancer (HCC) 03/09/2021   Right Breast   Bruises easily    Current use of long term anticoagulation    Apixaban   Diabetes mellitus    Edema      FEET/ LEGS   Family history of breast cancer    Family history of colon cancer    Family history of Hodgkin's disease    Family history of ovarian cancer    GERD (gastroesophageal reflux disease)    History of echocardiogram    a. 08/2020 Echo: EF 60-65%, no rwma. RVSP 37.5mmHg. Mod dil LA. Mild to mod MR/TR. Mild AoV sclerosis.   HLD (hyperlipidemia)     Hypertension    Hypothyroidism    Leg pain    a. 08/2020 ABIs: R 1.19, L 0.97.   Lymphoma (HCC) 12/2017   RADIATION RIGHT LEG    MRSA infection    remote history; posterior torso   Multiple thyroid nodules    Non-Hodgkin lymphoma (HCC) 2018   Permanent atrial fibrillation (HCC)    a. Dx 10/2016; b. 01/2017 s/p DCCV-->failed-->rate controlled; c. CHA2DS2VASc = 5-->eliquis.   Personal history of radiation therapy 12/2017   lymphoma   Personal history of radiation therapy 2022   Breast Cancer    Past Surgical History: Past Surgical History:  Procedure Laterality Date   BONE BIOPSY     2018   BREAST BIOPSY Right 02/26/2021   ffirm bx-"X" clip-HIGH-GRADE DUCTAL CARCINOMA IN SITU   BREAST LUMPECTOMY Right 03/09/2021   Procedure: BREAST LUMPECTOMY;  Surgeon: Byrnett, Jeffrey W, MD;  Location: [facility] ORS;  Service: General;  Laterality: Right;   CARDIOVERSION N/A 01/20/2017   Procedure: CARDIOVERSION;  Surgeon: Timothy J Gollan, MD;  Location: [facility];  Service: Cardiovascular;  Laterality: N/A;   CARDIOVERSION     AFIB 2018   CATARACT EXTRACTION W/PHACO Right 02/15/2018   Procedure: CATARACT EXTRACTION PHACO AND INTRAOCULAR LENS PLACEMENT (IOC);  Surgeon: Porfilio, William, MD;  Location: [facility];  Service: Ophthalmology;  Laterality: Right;  US 01:22.8 AP% 18.9 CDE 15.62 Fluid Pack Lot # [id number]   CATARACT EXTRACTION W/PHACO Left 03/08/2018   Procedure: CATARACT EXTRACTION PHACO AND INTRAOCULAR LENS PLACEMENT (IOC);  Surgeon: Porfilio, William, MD;  Location: [facility];  Service: Ophthalmology;  Laterality: Left;  US 01:05 AP% 15.8 CDE 10.28 Fluid pak lot # [id number]   COLONOSCOPY     EYE SURGERY      Past Gynecologic History:  Per HPI  OB History:  OB History  Gravida Para Term Preterm AB Living  2         2  SAB IAB Ectopic Multiple Live Births               # Outcome Date GA Lbr Len/2nd Weight Sex Delivery Anes PTL Lv  2 Gravida           1 Gravida              Family History: Family History  Problem Relation Age of Onset   Breast cancer Sister 80   Heart disease Brother    Ovarian cancer Maternal Aunt        another aunt- colon cancer   Stroke Sister    Arrhythmia Sister        A-Fib   Kidney cancer Neg Hx    Bladder Cancer Neg Hx     Social History: Social History   Socioeconomic History   Marital status: Widowed    Spouse name: Not on file   Number of children: Not on file   Years of education: Not on file   Highest education level: Not on file  Occupational History   Not on file  Tobacco Use   Smoking status:   Never   Smokeless tobacco: Never  Vaping Use   Vaping Use: Never used  Substance and Sexual Activity   Alcohol use: No   Drug use: No   Sexual activity: Not Currently  Other Topics Concern   Not on file  Social History Narrative   Lives by self; son lives about 2 miles. Never smoked; no alcohol. Used to [employer]   Social Determinants of Health   Financial Resource Strain: Not on file  Food Insecurity: Not on file  Transportation Needs: Not on file  Physical Activity: Not on file  Stress: Not on file  Social Connections: Not on file  Intimate Partner Violence: Not on file    Allergies: Allergies  Allergen Reactions   Nitrofurantoin Diarrhea, Nausea Only and Other (See Comments)   Sulfa Antibiotics Nausea Only and Other (See Comments)    Current Medications: Current Outpatient Medications  Medication Sig Dispense Refill   acetaminophen (TYLENOL) 500 MG tablet Take 500 mg by mouth at bedtime.     CRANBERRY PO Take 1 capsule by mouth 2 (two) times daily.      cyanocobalamin (,VITAMIN B-12,) 1000 MCG/ML injection Inject 1,000 mcg into the muscle every 30 (thirty) days.      diltiazem (CARDIZEM CD) 180 MG 24 hr capsule Take 1 capsule (180 mg total) by mouth 2 (two) times daily. 180 capsule 3   ELIQUIS 5 MG TABS tablet TAKE 1 TABLET BY MOUTH TWICE A DAY 180 tablet 1   furosemide (LASIX) 20 MG tablet Take  1 tablet (20 mg total) by mouth daily as needed for fluid or edema (SOB). 90 tablet 3   LACTOBACILLUS PO Take by mouth.     metFORMIN (GLUCOPHAGE) 500 MG tablet Take 500 mg by mouth 2 (two) times daily with a meal.     olmesartan (BENICAR) 40 MG tablet Take 1 tablet (40 mg total) by mouth daily. 90 tablet 3   omeprazole (PRILOSEC) 20 MG capsule Take 1 capsule (20 mg total) by mouth daily. 30 capsule 6   ondansetron (ZOFRAN) 4 MG tablet Take 4 mg by mouth every 8 (eight) hours as needed.     Trolamine Salicylate (BLUE-EMU HEMP EX) Apply 1 application topically daily as needed (Knee pain).     No current facility-administered medications for this visit.   Review of Systems  General: negative for fevers, changes in weight or night sweats Skin: negative for changes in moles or sores or rash Eyes: negative for changes in vision HEENT: negative for change in hearing, tinnitus, voice changes Pulmonary: negative for dyspnea, orthopnea, productive cough, wheezing Cardiac: negative for palpitations, pain Gastrointestinal: negative for nausea, vomiting, constipation, diarrhea, hematemesis, hematochezia Genitourinary/Sexual: negative for dysuria, retention, hematuria, incontinence Ob/Gyn: abnormal bleeding Musculoskeletal: pain in the legs and weakness Hematology: negative for easy bruising, abnormal bleeding Neurologic/Psych: negative for headaches, seizures, paralysis, numbness  Objective:  Physical Examination:  BP (!) 143/77   Pulse 83   Temp 97.6 F (36.4 C)   Resp 17   Wt 167 lb 3.2 oz (75.8 kg)   BMI 26.99 kg/m    ECOG Performance Status: 1 - Symptomatic but completely ambulatory  GENERAL: Patient is an elderly appearing in no acute distress HEENT:  Sclera clear. Anicteric NODES:  Negative axillary, supraclavicular, inguinal lymph node survery LUNGS:  Clear to auscultation bilaterally.   HEART:  Regular rate and rhythm.  ABDOMEN:  Soft, nontender.  No hernias, incisions well  healed. No masses or ascites EXTREMITIES:  No peripheral edema. Atraumatic.   No cyanosis SKIN:  Clear with no obvious rashes or skin changes.  NEURO:  Nonfocal. Well oriented.  Appropriate affect.  Pelvic:Exam chaperoned CMA. Vulva: normal appearing vulva with no masses, tenderness or lesions Vagina: normal vagina Adnexa: normal adnexa in size, nontender and no masses Uterus: uterus is normal size, shape, consistency and nontender Cervix: no cervical motion tenderness and no lesions Rectal: not indicated    Lab Review 01/28/2023 Thyroid Stimulating Hormone (TSH) 0.450-5.330 uIU/ml uIU/mL 2.040   HbA1c, CBC, CMP, TSH ordered  Radiologic Imaging: As per HPI    Assessment:  Barbara Mooney is a 89 y.o. 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  Type 2 diabetes mellitus  Hypothyroidism  A-fib on chronic long-term anticoagulation  Personal and family history of malignancy.  Prior genetic testing negative.  Medical co-morbidities complicating care: diabetes. Non-Hodgkin lymphoma and breast cancer (DCIS) status post radiation therapy Plan:   Problem List Items Addressed This Visit   None Visit Diagnoses     Endometrial cancer determined by uterine biopsy (HCC)    -  Primary   Relevant Orders   CBC with Differential/Platelet   Comprehensive metabolic panel   TSH   Hemoglobin A1c   DG Chest 2 View   Counseling and coordination of care           We discussed options for management inclu surgery, radiation or hormonal therapy.  Her performance status is a 1-2 and she does appear to be frail, but overall great health given her age. We reviewed the higher curative rates with surgery, we recommend surgery as long as she is cleared by cardiology and anesthesia with laparoscopic total hysterectomy, bilateral salpingo-oophorectomy, sentinel lymph node injection with ICG, mapping,  biopsy of sentinel lymph nodes, possible node dissection, possible laparotomy on 07/13/2023 with Dr. Berchuck.  We will need to confirm that Dr. Beasley is available to assist with surgery.   Risks of surgery were discussed in detail. These include infection, anesthesia, bleeding, transfusion, wound separation, hernia, vaginal cuff dehiscence, medical issues (blood clots, stroke, heart attack, fluid in the lungs, pneumonia, abnormal heart rhythm, death), possible exploratory surgery with larger incision, lymphedema, lymphocyst, allergic reaction, injury to adjacent organs (bowel, bladder, blood vessels, nerves, ureters).  She may be at higher risk for blood clots given her personal history and stressed the importance of early and adequate ambulation.  She will receive DVT/VTE prophylaxis prophylaxis as well as restarting her Eliquis the day after surgery.  We recommend that she stop her Eliquis 3 days before surgery and this will be messaged to her by Lauren Allen.  We just want to confirm this plan with cardiology and also check on her creatinine.   GYN VTE Prophylaxis Risk point calculation: +3  age >74 +1  BMI 25-30 TOTAL: 4   VTE Risk assessment:  This patient has cancer, elevated tumor markers or personal/family hx VTE, and does not have disseminated cancer, or planned open or radical surgery.  The calculated risk score is 0-4.    High Risk:   One pre-op dose UFH, then continuous post-op dosing with LMWH or UFH while inpatient AND SCDs.  We ordered TSH CBC CMP and hemoglobin A1c today.  Chest x-ray obtained to assess for pulmonary mets.  We reached out to cardiology regarding their request for EKG and ECHO or other testing.  We discussed that her pathology will be reviewed after surgery and we can make   a determination if any further treatment may be needed with radiation or chemotherapy.     Suggested return to clinic in  4 -6 weeks after surgery.    The patient's diagnosis, an outline  of the further diagnostic and laboratory studies which will be required, the recommendation, and alternatives were discussed.  All questions were answered to the patient's satisfaction.  A total of 90 minutes were spent with the patient/family today; >50% was spent in education, counseling and coordination of care for endometrial cancer.     Alvarez , MD    CC:   Dr. Beasley 

## 2023-06-23 ENCOUNTER — Encounter: Payer: Self-pay | Admitting: Medical

## 2023-06-23 ENCOUNTER — Ambulatory Visit: Payer: Medicare Other | Attending: Medical | Admitting: Medical

## 2023-06-23 VITALS — BP 120/58 | HR 76 | Ht 66.0 in | Wt 166.4 lb

## 2023-06-23 DIAGNOSIS — Z01810 Encounter for preprocedural cardiovascular examination: Secondary | ICD-10-CM | POA: Insufficient documentation

## 2023-06-23 DIAGNOSIS — R600 Localized edema: Secondary | ICD-10-CM | POA: Insufficient documentation

## 2023-06-23 DIAGNOSIS — I4821 Permanent atrial fibrillation: Secondary | ICD-10-CM | POA: Insufficient documentation

## 2023-06-23 DIAGNOSIS — I10 Essential (primary) hypertension: Secondary | ICD-10-CM | POA: Insufficient documentation

## 2023-06-23 NOTE — Patient Instructions (Signed)
Medication Instructions:  Your physician recommends that you continue on your current medications as directed. Please refer to the Current Medication list given to you today.  *If you need a refill on your cardiac medications before your next appointment, please call your pharmacy*   Lab Work: None ordered today   Testing/Procedures: Your physician has requested that you have an echocardiogram. Echocardiography is a painless test that uses sound waves to create images of your heart. It provides your doctor with information about the size and shape of your heart and how well your heart's chambers and valves are working.   You may receive an ultrasound enhancing agent through an IV if needed to better visualize your heart during the echo. This procedure takes approximately one hour.  There are no restrictions for this procedure.  This will take place at [address] [address], [place] [address]    Follow-Up: At [name], you and your health needs are our priority.  As part of our continuing mission to provide you with exceptional heart care, we have created designated Provider Care Teams.  These Care Teams include your primary Cardiologist (physician) and Advanced Practice Providers (APPs -  Physician Assistants and Nurse Practitioners) who all work together to provide you with the care you need, when you need it.  We recommend signing up for the patient portal called "MyChart".  Sign up information is provided on this After Visit Summary.  MyChart is used to connect with patients for Virtual Visits (Telemedicine).  Patients are able to view lab/test results, encounter notes, upcoming appointments, etc.  Non-urgent messages can be sent to your provider as well.   To learn more about what you can do with MyChart, go to [email or web address].    Your next appointment:   3 month(s)  Provider:   You may see Julien Nordmann, MD or one of the following  Advanced Practice Providers on your designated Care Team:   Nicolasa Ducking, NP Eula Listen, PA-C Cadence Fransico Michael, PA-C Charlsie Quest, NP

## 2023-06-23 NOTE — Progress Notes (Signed)
Cardiology Office Note:    Date:  06/23/2023   ID:  Barbara Mooney, DOB 1934/03/19, MRN [id number]  PCP:  Patrice Paradise, MD  [facility] Cardiologist:  Julien Nordmann, MD  [facility] Electrophysiologist:  None   Referring MD: Patrice Paradise, MD   Chief Complaint: 1 year follow-up  History of Present Illness:    Barbara Mooney is a 89 y.o. female with a hx of permanent atrial fibrillation, hypertension, hyperlipidemia, diabetes, GERD, follicular lymphoma, breast cancer, and hyperkalemia who presents for follow-up.  Patient previously underwent echocardiogram in 2017 showing an EF of 55 to 60%, mild LVH, normal wall motion, no significant valvular abnormalities,.  In November 2017, she was noted to be in A-fib with RVR in the setting of sepsis.  She underwent cardioversion in February 2018 but eventually returned to A-fib.  She has been recently well rate controlled and overall asymptomatic.  Due to bradycardia, metoprolol was discontinued at some point, though she remained on long-acting diltiazem, and has been anticoagulated with Eliquis.  Due to fatigue in August 2021 she underwent echo which showed normal LV function 60 to 65%, mild to moderate MR and TR and aortic valve sclerosis.  ABIs in September 2021 were normal.  Patient was last seen in August 2023 was rate controlled in A-fib. CHADSVASC of 5 (CHD, HTN, DM2, age, PAD, female).   Today, the patient is here for pre-op clearance for surgery, which is scheduled for July 31st. She was having spotting and cramps, which is what made her go into be checked out. She denies chest pain. She has generalized fatigue with some DOE. She has some lower leg edema, takes laisx as needed for swelling. No orthopnea or pnd. No history of stroke. Re-check BP 124/76. Recent labs reviewed.   Past Medical History:  Diagnosis Date   Arthritis    Atrial fibrillation (HCC)    Breast cancer (HCC) 03/09/2021   Right Breast   Bruises  easily    Current use of long term anticoagulation    Apixaban   Diabetes mellitus    Edema    FEET/ LEGS   Family history of breast cancer    Family history of colon cancer    Family history of Hodgkin's disease    Family history of ovarian cancer    GERD (gastroesophageal reflux disease)    History of echocardiogram    a. 08/2020 Echo: EF 60-65%, no rwma. RVSP 37.34mmHg. Mod dil LA. Mild to mod MR/TR. Mild AoV sclerosis.   HLD (hyperlipidemia)    Hypertension    Hypothyroidism    Leg pain    a. 08/2020 ABIs: R 1.19, L 0.97.   Lymphoma (HCC) 12/2017   RADIATION RIGHT LEG    MRSA infection    remote history; posterior torso   Multiple thyroid nodules    Non-Hodgkin lymphoma (HCC) 2018   Permanent atrial fibrillation (HCC)    a. Dx 10/2016; b. 01/2017 s/p DCCV-->failed-->rate controlled; c. CHA2DS2VASc = 5-->eliquis.   Personal history of radiation therapy 12/2017   lymphoma   Personal history of radiation therapy 2022   Breast Cancer    Past Surgical History:  Procedure Laterality Date   BONE BIOPSY     2018   BREAST BIOPSY Right 02/26/2021   ffirm bx-"X" clip-HIGH-GRADE DUCTAL CARCINOMA IN SITU   BREAST LUMPECTOMY Right 03/09/2021   Procedure: BREAST LUMPECTOMY;  Surgeon: Earline Mayotte, MD;  Location: [facility] ORS;  Service: General;  Laterality: Right;  CARDIOVERSION N/A 01/20/2017   Procedure: CARDIOVERSION;  Surgeon: Antonieta Iba, MD;  Location: [facility] ORS;  Service: Cardiovascular;  Laterality: N/A;   CARDIOVERSION     AFIB 2018   CATARACT EXTRACTION W/PHACO Right 02/15/2018   Procedure: CATARACT EXTRACTION PHACO AND INTRAOCULAR LENS PLACEMENT (IOC);  Surgeon: Galen Manila, MD;  Location: [facility] ORS;  Service: Ophthalmology;  Laterality: Right;  [place] 01:22.8 AP% 18.9 CDE 15.62 Fluid Pack Lot # [id number]   CATARACT EXTRACTION W/PHACO Left 03/08/2018   Procedure: CATARACT EXTRACTION PHACO AND INTRAOCULAR LENS PLACEMENT (IOC);  Surgeon: Galen Manila, MD;   Location: [facility] ORS;  Service: Ophthalmology;  Laterality: Left;  [place] 01:05 AP% 15.8 CDE 10.28 Fluid pak lot # [id number]   COLONOSCOPY     EYE SURGERY      Current Medications: Current Meds  Medication Sig   acetaminophen (TYLENOL) 500 MG tablet Take 500 mg by mouth at bedtime.   CRANBERRY PO Take 1 capsule by mouth 2 (two) times daily.    cyanocobalamin (,VITAMIN B-12,) 1000 MCG/ML injection Inject 1,000 mcg into the muscle every 30 (thirty) days.    diltiazem (CARDIZEM CD) 180 MG 24 hr capsule Take 1 capsule (180 mg total) by mouth 2 (two) times daily.   ELIQUIS 5 MG TABS tablet TAKE 1 TABLET BY MOUTH TWICE A DAY   furosemide (LASIX) 20 MG tablet Take 1 tablet (20 mg total) by mouth daily as needed for fluid or edema (SOB).   LACTOBACILLUS PO Take by mouth.   metFORMIN (GLUCOPHAGE) 500 MG tablet Take 500 mg by mouth 2 (two) times daily with a meal.   olmesartan (BENICAR) 40 MG tablet Take 1 tablet (40 mg total) by mouth daily.   omeprazole (PRILOSEC) 20 MG capsule Take 1 capsule (20 mg total) by mouth daily.   ondansetron (ZOFRAN) 4 MG tablet Take 4 mg by mouth every 8 (eight) hours as needed.   Trolamine Salicylate (BLUE-EMU HEMP EX) Apply 1 application topically daily as needed (Knee pain).     Allergies:   Nitrofurantoin and Sulfa antibiotics   Social History   Socioeconomic History   Marital status: Widowed    Spouse name: Not on file   Number of children: Not on file   Years of education: Not on file   Highest education level: Not on file  Occupational History   Not on file  Tobacco Use   Smoking status: Never   Smokeless tobacco: Never  Vaping Use   Vaping status: Never Used  Substance and Sexual Activity   Alcohol use: No   Drug use: No   Sexual activity: Not Currently  Other Topics Concern   Not on file  Social History Narrative   Lives by self; son lives about 2 miles. Never smoked; no alcohol. 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 There is no history of Kidney cancer or Bladder Cancer.  ROS:   Please see the history of present illness.     All other systems reviewed and are negative.  EKGs/Labs/Other Studies Reviewed:  The following studies were reviewed today:  Echo 2021 1. Left ventricular ejection fraction, by estimation, is 60 to 65%. The  left ventricle has normal function. The left ventricle has no regional  wall motion abnormalities. Left ventricular diastolic parameters are  indeterminate.   2. Right ventricular systolic function is normal. The right ventricular  size is normal. There is mildly elevated pulmonary artery systolic  pressure. The estimated right ventricular systolic pressure is 37.5 mmHg.   3. Left atrial size was moderately dilated.   4. Right atrial size was mildly dilated.   5. Mild to moderate mitral valve regurgitation.   6. Tricuspid valve regurgitation is mild to moderate.   7. Aortic valve regurgitation is mild. aortic valve  sclerosis/calcification is present, without any evidence of aortic  stenosis.   EKG:  EKG is  ordered today.  The ekg ordered today demonstrates Afib 72bpm, RBBB, TWI anterior leads, III  Recent Labs: 06/22/2023: ALT 12; BUN 16; Creatinine, Ser 0.85; Hemoglobin 12.4; Platelets 298; Potassium 4.0; Sodium 140; TSH 1.437  Recent Lipid Panel No results found for: "CHOL", "TRIG", "HDL", "CHOLHDL", "VLDL", "LDLCALC", "LDLDIRECT"  Physical Exam:    VS:  BP (!) 120/58   Pulse 76   Ht 5\' 6"  (1.676 m)   Wt 166 lb 6.4 oz (75.5 kg)   SpO2 97%   BMI 26.86 kg/m      Wt Readings from Last 3 Encounters:  06/23/23 166 lb 6.4 oz (75.5 kg)  06/22/23 167 lb 3.2 oz (75.8 kg)  06/01/23 165 lb (74.8 kg)     GEN:  Well nourished, well developed in no acute distress HEENT: Normal NECK: No JVD; No carotid bruits LYMPHATICS: No lymphadenopathy CARDIAC: Irreg Irreg, no murmurs, rubs, gallops RESPIRATORY:  Clear to auscultation without rales, wheezing or rhonchi  ABDOMEN: Soft, non-tender, non-distended MUSCULOSKELETAL:  mild pedal edema; No deformity  SKIN: Warm and dry NEUROLOGIC:  Alert and oriented x 3 PSYCHIATRIC:  Normal affect   ASSESSMENT:    1. Pre-operative cardiovascular examination   2. Permanent atrial fibrillation (HCC)   3. Essential hypertension   4. Lower leg edema    PLAN:    In order of problems listed above:  Pre-op cardiac evaluation for hysterectomy Permanent Afib Lower leg edema HTN The patient was recently diagnosed with endometrial cancer and will undergo total hysterectomy, bilateral salpingo-oophorectomy, sentinel lymph node injection, possible laparotomy on July 31st. The patient has a history of permanent Afib on Eliquis. She is in rate controlled afib by EKG on Diltiazem. CHADSVAC of 5, no history of stroke. Ok to hold Eliquis 2 days prior to surgery, restart per surgeon recommendations. Patient denies chest pain, but has chronic fatigue and DOE. She has lower leg edema on lasix as needed for swelling. Echo from 2021 showed LVEF 60-65%, no WMA, mild to mod MR, mild AI. I will update echocardiogram. EKG today shows no ischemic changes. METS>4. According to RCRI she is Class 2 risk, 6% 30 day risk of death, MI or cardiac arrest. IF echo is unremarkable, OK to proceed with surgery.   Disposition: Follow up in 3 month(s) with MD   Signed, Elliet Goodnow David Stall, PA-C  06/23/2023 3:44 PM    [facility]

## 2023-06-28 ENCOUNTER — Telehealth: Payer: Self-pay

## 2023-06-28 NOTE — Telephone Encounter (Signed)
Call placed to daughter, Marylu Lund, to arrange surgery. No availability 7/31. July 27, 2023 approved by Dr. Johnnette Litter.

## 2023-06-29 NOTE — H&P (View-Only) (Signed)
Surgery has been scheduled for July 27, 2023. Pending ECHO. PAT appointment to be arranged. Post operative appointment scheduled. Spoke with Barbara Mooney, encouraged her to call with any further questions.                  Laparoscopy Laparoscopy is a procedure to diagnose diseases in the abdomen. During the procedure, a thin, lighted, pencil-sized instrument called a laparoscope is inserted into the abdomen through an incision. The laparoscope allows your health care provider to look at the organs inside your body. LET [facility] CARE PROVIDER KNOW ABOUT: Any allergies you have. All medicines you are taking, including vitamins, herbs, eye drops, creams, and over-the-counter medicines. Previous problems you or members of your family have had with the use of anesthetics. Any blood disorders you have. Previous surgeries you have had. Medical conditions you have. RISKS AND COMPLICATIONS  Generally, this is a safe procedure. However, problems can occur, which may include: Infection. Bleeding. Damage to other organs. Allergic reaction to the anesthetics used during the procedure. BEFORE THE PROCEDURE Do not eat or drink anything after midnight on the night before the procedure or as directed by your health care provider. Ask your health care provider about: Changing or stopping your regular medicines. Taking medicines such as aspirin and ibuprofen. These medicines can thin your blood. Do not take these medicines before your procedure if your health care provider instructs you not to. Plan to have someone take you home after the procedure. PROCEDURE You may be given a medicine to help you relax (sedative). You will be given a medicine to make you sleep (general anesthetic). Your abdomen will be inflated with a gas. This will make your organs easier to see. Small incisions will be made in your abdomen. A laparoscope and other small instruments will be inserted into the abdomen through the  incisions. A tissue sample may be removed from an organ in the abdomen for examination. The instruments will be removed from the abdomen. The gas will be released. The incisions will be closed with stitches (sutures). AFTER THE PROCEDURE  Your blood pressure, heart rate, breathing rate, and blood oxygen level will be monitored often until the medicines you were given have worn off.   This information is not intended to replace advice given to you by your health care provider. Make sure you discuss any questions you have with your health care provider.                                             Bowel Symptoms After Surgery After gynecologic surgery, women often have temporary changes in bowel function (constipation and gas pain).  Following are tips to help prevent and treat common bowel problems.  It also tells you when to call the doctor.  This is important because some symptoms might be a sign of a more serious bowel problem such as obstruction (bowel blockage).  These problems are rare but can happen after gynecologic surgery.   Besides surgery, what can temporarily affect bowel function? 1. Dietary changes   2. Decreased physical activity   3.Antibiotics   4. Pain medication   How can I prevent constipation (three days or more without a stool)? Include fiber in your diet: whole grains, raw or dried fruits & vegetables, prunes, prune/pear juiceDrink at least 8 glasses of liquid (preferably water) every day Avoid: Gas  forming foods such as broccoli, beans, peas, salads, cabbage, sweet potatoes Greasy, fatty, or fried foods Activity helps bowel function return to normal, walk around the house at least 3-4 times each day for 15 minutes or longer, if tolerated.  Rocking in a rocking chair is preferable to sitting still. Stool softeners: these are not laxatives, but serve to soften the stool to avoid straining.  Take 2-4 times a day until normal bowel function returns         Examples:  Colace or generic equivalent (Docusate) Bulk laxatives: provide a concentrated source of fiber.  They do not stimulate the bowel.  Take 1-2 times each day until normal bowel function return.              Examples: Citrucel, Metamucil, Fiberal, Fibercon   What can I take for "Gas Pains"? Simethicone (Mylicon, Gas-X, Maalox-Gas, Mylanta-Gas) take 3-4 times a day Maalox Regular - take 3-4 times a day Mylanta Regular - take 3-4 times a day   What can I take if I become constipated? Start with stool softeners and add additional laxatives below as needed to have a bowel movement every 1-2 days  Stool softeners 1-2 tablets, 2 times a day Senokot 1-2 tablets, 1-2 times a day Glycerin suppository can soften hard stool take once a day Bisacodyl suppository once a day  Milk of Magnesia 30 mL 1-2 times a day Fleets or tap water enema    What can I do for nausea?  Limit most solid foods for 24-48 hours Continue eating small frequent amounts of liquids and/or bland soft foods Toast, crackers, cooked cereal (grits, cream of wheat, rice) Benadryl: a mild anti-nausea medicine can be obtained without a prescription. May cause drowsiness, especially if taken with narcotic pain medicines Contact provider for prescription nausea medication     What can I do, or take for diarrhea (more than five loose stools per day)? Drink plenty of clear fluids to prevent dehydration May take Kaopectate, Pepto-Bismol, Imodium, or probiotics for 1-2 days Anusol or Preparation-H can be helpful for hemorrhoids and irritated tissue around anus   When should I call the doctor?             CONSTIPATION:  Not relieved after three days following the above program VOMITING: That contains blood, "coffee ground" material More the three times/hour and unable to keep down nausea medication for more than eight hours With dry mouth, dark or strong urine, feeling light-headed, dizzy, or confused With severe abdominal pain or  bloating for more than 24 hours DIARRHEA: That continues for more then 24-48 hours despite treatment That contains blood or tarry material With dry mouth, dark or strong urine, feeling light~headed, dizzy, or confused FEVER: 101 F or higher along with nausea, vomiting, gas pain, diarrhea UNABLE TO: Pass gas from rectum for more than 24 hours Tolerate liquids by mouth for more than 24 hours        Laparoscopic Hysterectomy, Care After Refer to this sheet in the next few weeks. These instructions provide you with information on caring for yourself after your procedure. Your health care provider may also give you more specific instructions. Your treatment has been planned according to current medical practices, but problems sometimes occur. Call your health care provider if you have any problems or questions after your procedure. What can I expect after the procedure? Pain and bruising at the incision sites. You will be given pain medicine to control it. Menopausal symptoms such as hot flashes,  night sweats, and insomnia if your ovaries were removed. Sore throat from the breathing tube that was inserted during surgery. Follow these instructions at home: Only take over-the-counter or prescription medicines for pain, discomfort, or fever as directed by your health care provider. Do not take aspirin. It can cause bleeding. Do not drive when taking pain medicine. Follow your health care provider's advice regarding diet, exercise, lifting, driving, and general activities. Resume your usual diet as directed and allowed. Get plenty of rest and sleep. Do not douche, use tampons, or have sexual intercourse for at least 6 weeks, or until your health care provider gives you permission. Change your bandages (dressings) as directed by your health care provider. Monitor your temperature and notify your health care provider of a fever. Take showers instead of baths for 2-3 weeks. Do not drink alcohol  until your health care provider gives you permission. If you develop constipation, you may take a mild laxative with your health care provider's permission. Bran foods may help with constipation problems. Drinking enough fluids to keep your urine clear or pale yellow may help as well. Try to have someone home with you for 1-2 weeks to help around the house. Keep all of your follow-up appointments as directed by your health care provider. Contact a health care provider if: You have swelling, redness, or increasing pain around your incision sites. You have pus coming from your incision. You notice a bad smell coming from your incision. Your incision breaks open. You feel dizzy or lightheaded. You have pain or bleeding when you urinate. You have persistent diarrhea. You have persistent nausea and vomiting. You have abnormal vaginal discharge. You have a rash. You have any type of abnormal reaction or develop an allergy to your medicine. You have poor pain control with your prescribed medicine. Get help right away if: You have chest pain or shortness of breath. You have severe abdominal pain that is not relieved with pain medicine. You have pain or swelling in your legs. This information is not intended to replace advice given to you by your health care provider. Make sure you discuss any questions you have with your health care provider. Document Released: 09/19/2013 Document Revised: 05/06/2016 Document Reviewed: 06/19/2013 Elsevier Interactive Patient Education  2017 [employer].

## 2023-06-29 NOTE — Progress Notes (Signed)
Surgery has been scheduled for July 27, 2023. Pending ECHO. PAT appointment to be arranged. Post operative appointment scheduled. Spoke with Barbara Mooney, encouraged her to call with any further questions.                  Laparoscopy Laparoscopy is a procedure to diagnose diseases in the abdomen. During the procedure, a thin, lighted, pencil-sized instrument called a laparoscope is inserted into the abdomen through an incision. The laparoscope allows your health care provider to look at the organs inside your body. LET [facility] CARE PROVIDER KNOW ABOUT: Any allergies you have. All medicines you are taking, including vitamins, herbs, eye drops, creams, and over-the-counter medicines. Previous problems you or members of your family have had with the use of anesthetics. Any blood disorders you have. Previous surgeries you have had. Medical conditions you have. RISKS AND COMPLICATIONS  Generally, this is a safe procedure. However, problems can occur, which may include: Infection. Bleeding. Damage to other organs. Allergic reaction to the anesthetics used during the procedure. BEFORE THE PROCEDURE Do not eat or drink anything after midnight on the night before the procedure or as directed by your health care provider. Ask your health care provider about: Changing or stopping your regular medicines. Taking medicines such as aspirin and ibuprofen. These medicines can thin your blood. Do not take these medicines before your procedure if your health care provider instructs you not to. Plan to have someone take you home after the procedure. PROCEDURE You may be given a medicine to help you relax (sedative). You will be given a medicine to make you sleep (general anesthetic). Your abdomen will be inflated with a gas. This will make your organs easier to see. Small incisions will be made in your abdomen. A laparoscope and other small instruments will be inserted into the abdomen through the  incisions. A tissue sample may be removed from an organ in the abdomen for examination. The instruments will be removed from the abdomen. The gas will be released. The incisions will be closed with stitches (sutures). AFTER THE PROCEDURE  Your blood pressure, heart rate, breathing rate, and blood oxygen level will be monitored often until the medicines you were given have worn off.   This information is not intended to replace advice given to you by your health care provider. Make sure you discuss any questions you have with your health care provider.                                             Bowel Symptoms After Surgery After gynecologic surgery, women often have temporary changes in bowel function (constipation and gas pain).  Following are tips to help prevent and treat common bowel problems.  It also tells you when to call the doctor.  This is important because some symptoms might be a sign of a more serious bowel problem such as obstruction (bowel blockage).  These problems are rare but can happen after gynecologic surgery.   Besides surgery, what can temporarily affect bowel function? 1. Dietary changes   2. Decreased physical activity   3.Antibiotics   4. Pain medication   How can I prevent constipation (three days or more without a stool)? Include fiber in your diet: whole grains, raw or dried fruits & vegetables, prunes, prune/pear juiceDrink at least 8 glasses of liquid (preferably water) every day Avoid: Gas  forming foods such as broccoli, beans, peas, salads, cabbage, sweet potatoes Greasy, fatty, or fried foods Activity helps bowel function return to normal, walk around the house at least 3-4 times each day for 15 minutes or longer, if tolerated.  Rocking in a rocking chair is preferable to sitting still. Stool softeners: these are not laxatives, but serve to soften the stool to avoid straining.  Take 2-4 times a day until normal bowel function returns         Examples:  Colace or generic equivalent (Docusate) Bulk laxatives: provide a concentrated source of fiber.  They do not stimulate the bowel.  Take 1-2 times each day until normal bowel function return.              Examples: Citrucel, Metamucil, Fiberal, Fibercon   What can I take for "Gas Pains"? Simethicone (Mylicon, Gas-X, Maalox-Gas, Mylanta-Gas) take 3-4 times a day Maalox Regular - take 3-4 times a day Mylanta Regular - take 3-4 times a day   What can I take if I become constipated? Start with stool softeners and add additional laxatives below as needed to have a bowel movement every 1-2 days  Stool softeners 1-2 tablets, 2 times a day Senokot 1-2 tablets, 1-2 times a day Glycerin suppository can soften hard stool take once a day Bisacodyl suppository once a day  Milk of Magnesia 30 mL 1-2 times a day Fleets or tap water enema    What can I do for nausea?  Limit most solid foods for 24-48 hours Continue eating small frequent amounts of liquids and/or bland soft foods Toast, crackers, cooked cereal (grits, cream of wheat, rice) Benadryl: a mild anti-nausea medicine can be obtained without a prescription. May cause drowsiness, especially if taken with narcotic pain medicines Contact provider for prescription nausea medication     What can I do, or take for diarrhea (more than five loose stools per day)? Drink plenty of clear fluids to prevent dehydration May take Kaopectate, Pepto-Bismol, Imodium, or probiotics for 1-2 days Anusol or Preparation-H can be helpful for hemorrhoids and irritated tissue around anus   When should I call the doctor?             CONSTIPATION:  Not relieved after three days following the above program VOMITING: That contains blood, "coffee ground" material More the three times/hour and unable to keep down nausea medication for more than eight hours With dry mouth, dark or strong urine, feeling light-headed, dizzy, or confused With severe abdominal pain or  bloating for more than 24 hours DIARRHEA: That continues for more then 24-48 hours despite treatment That contains blood or tarry material With dry mouth, dark or strong urine, feeling light~headed, dizzy, or confused FEVER: 101 F or higher along with nausea, vomiting, gas pain, diarrhea UNABLE TO: Pass gas from rectum for more than 24 hours Tolerate liquids by mouth for more than 24 hours        Laparoscopic Hysterectomy, Care After Refer to this sheet in the next few weeks. These instructions provide you with information on caring for yourself after your procedure. Your health care provider may also give you more specific instructions. Your treatment has been planned according to current medical practices, but problems sometimes occur. Call your health care provider if you have any problems or questions after your procedure. What can I expect after the procedure? Pain and bruising at the incision sites. You will be given pain medicine to control it. Menopausal symptoms such as hot flashes,  night sweats, and insomnia if your ovaries were removed. Sore throat from the breathing tube that was inserted during surgery. Follow these instructions at home: Only take over-the-counter or prescription medicines for pain, discomfort, or fever as directed by your health care provider. Do not take aspirin. It can cause bleeding. Do not drive when taking pain medicine. Follow your health care provider's advice regarding diet, exercise, lifting, driving, and general activities. Resume your usual diet as directed and allowed. Get plenty of rest and sleep. Do not douche, use tampons, or have sexual intercourse for at least 6 weeks, or until your health care provider gives you permission. Change your bandages (dressings) as directed by your health care provider. Monitor your temperature and notify your health care provider of a fever. Take showers instead of baths for 2-3 weeks. Do not drink alcohol  until your health care provider gives you permission. If you develop constipation, you may take a mild laxative with your health care provider's permission. Bran foods may help with constipation problems. Drinking enough fluids to keep your urine clear or pale yellow may help as well. Try to have someone home with you for 1-2 weeks to help around the house. Keep all of your follow-up appointments as directed by your health care provider. Contact a health care provider if: You have swelling, redness, or increasing pain around your incision sites. You have pus coming from your incision. You notice a bad smell coming from your incision. Your incision breaks open. You feel dizzy or lightheaded. You have pain or bleeding when you urinate. You have persistent diarrhea. You have persistent nausea and vomiting. You have abnormal vaginal discharge. You have a rash. You have any type of abnormal reaction or develop an allergy to your medicine. You have poor pain control with your prescribed medicine. Get help right away if: You have chest pain or shortness of breath. You have severe abdominal pain that is not relieved with pain medicine. You have pain or swelling in your legs. This information is not intended to replace advice given to you by your health care provider. Make sure you discuss any questions you have with your health care provider. Document Released: 09/19/2013 Document Revised: 05/06/2016 Document Reviewed: 06/19/2013 Elsevier Interactive Patient Education  2017 [employer].

## 2023-07-05 ENCOUNTER — Other Ambulatory Visit: Payer: Self-pay | Admitting: Cardiovascular Disease

## 2023-07-08 ENCOUNTER — Ambulatory Visit (HOSPITAL_COMMUNITY): Payer: Medicare Other | Attending: Medical

## 2023-07-08 DIAGNOSIS — R600 Localized edema: Secondary | ICD-10-CM | POA: Insufficient documentation

## 2023-07-08 LAB — ECHOCARDIOGRAM COMPLETE
Area-P 1/2: 3.14 cm2
S' Lateral: 3.2 cm

## 2023-07-14 ENCOUNTER — Encounter: Payer: Self-pay | Admitting: Urology

## 2023-07-18 ENCOUNTER — Telehealth: Payer: Self-pay | Admitting: Urgent Care

## 2023-07-18 ENCOUNTER — Encounter
Admission: RE | Admit: 2023-07-18 | Discharge: 2023-07-18 | Disposition: A | Discharge status: Home / Self Care | Payer: Medicare Other | Source: Ambulatory Visit | Attending: Obstetrics and Gynecology | Admitting: Obstetrics and Gynecology

## 2023-07-18 VITALS — Ht 66.0 in | Wt 167.5 lb

## 2023-07-18 DIAGNOSIS — B952 Enterococcus as the cause of diseases classified elsewhere: Secondary | ICD-10-CM

## 2023-07-18 DIAGNOSIS — Z01812 Encounter for preprocedural laboratory examination: Secondary | ICD-10-CM

## 2023-07-18 DIAGNOSIS — Z01818 Encounter for other preprocedural examination: Secondary | ICD-10-CM

## 2023-07-18 DIAGNOSIS — A498 Other bacterial infections of unspecified site: Secondary | ICD-10-CM

## 2023-07-18 HISTORY — DX: Long term (current) use of anticoagulants: Z79.01

## 2023-07-18 HISTORY — DX: Sepsis, unspecified organism: A41.9

## 2023-07-18 HISTORY — DX: Asymptomatic varicose veins of bilateral lower extremities: I83.93

## 2023-07-18 HISTORY — DX: Mixed incontinence: N39.46

## 2023-07-18 HISTORY — DX: Unsteadiness on feet: R26.81

## 2023-07-18 HISTORY — DX: Urinary tract infection, site not specified: N39.0

## 2023-07-18 HISTORY — DX: Type 2 diabetes mellitus without complications: E11.9

## 2023-07-18 HISTORY — DX: Deficiency of other specified B group vitamins: E53.8

## 2023-07-18 HISTORY — DX: Chronic kidney disease, stage 3 unspecified: N18.30

## 2023-07-18 HISTORY — DX: Hypokalemia: E87.6

## 2023-07-18 HISTORY — DX: Personal history of other diseases of the digestive system: Z87.19

## 2023-07-18 HISTORY — DX: Bradycardia, unspecified: R00.1

## 2023-07-18 HISTORY — DX: Localized edema: R60.0

## 2023-07-18 HISTORY — DX: Unspecified hearing loss, unspecified ear: H91.90

## 2023-07-18 HISTORY — DX: Anemia, unspecified: D64.9

## 2023-07-18 MED ORDER — CIPROFLOXACIN HCL 500 MG PO TABS: 500.0000 mg | ORAL_TABLET | Freq: Two times a day (BID) | ORAL | 0 refills | Status: AC

## 2023-07-18 NOTE — Progress Notes (Signed)
[facility] Perioperative Services: Pre-Admission/Anesthesia Testing  Abnormal Lab Notification and Treatment Plan of Care   Date: 07/18/23  Name: Barbara Mooney MRN:   [id number]  Re: Abnormal labs noted during PAT appointment   Notified:  Provider Name Provider Role Notification Mode  Consuello Masse, FNP-C GYN Oncology APP Routed and/or faxed via [facility]   Abnormal Lab Value(s):     Clinical Information and Notes:  Patient is scheduled for TOTAL LAPAROSCOPIC HYSTERECTOMY WITH BILATERAL SALPINGO OOPHORECTOMY, SENTINEL LYMPH NODE INJECTION AND MAPPING, POSSIBLE NODE DISSECTION, POSSIBLE LAPAROTOMY on 07/27/2023.    UA performed on 07/14/2023 by consistent with/concerning for infection.  No leukocytosis noted on CBC; WBC 7.1 Renal function: BUN 18 mg/dL and creatinine 1.0 mg/dL (eGFR 55)  Impression and Plan:  Barbara Mooney with a UA that was (+) for infection; sample nitrate and LE (+). Reflex culture sent that grew out pathogenically significant Enterobacter cloacae colony counts. Patient's daughter with concerns. She reports that she has not heard back from her PCP regarding this result. Daughter reporting that patient "gets sick" quickly and has a history of urosepsis. She has reached out to her urologist to discuss. Patient not having any significant symptoms at this time, however daughter reports that patient generally does not experience symptoms until after she develops significant infection. Daughter goes on to report that urine appears different (worse) than normal and that it is malodorous. With patient being asymptomatic, she was directed by urology to contact her surgeon to discuss treatment.   Given patient's age, history of significant UTI leading to urosepsis, and upcoming surgery with GYN/onc, I would like to proceed with empiric treatment for urinary tract infection to prevent any potentially significant perioperative complications related to the  aforementioned  Allergies reviewed. Patient does dot do well with sulfa based drugs or nitrofurantoin. Sensitivity data suggests that there is a higher degree of resistance associated with 1st and 2nd generation cephalospoins and amoxicillin-clavulanate. Culture report also reviewed to ensure culture appropriate coverage is being provided. Will treat with a 3 day course of CIPROFLOXACIN. Patient encouraged to complete the entire course of antibiotics even if she begins to feel better.   Meds ordered this encounter  Medications   ciprofloxacin (CIPRO) 500 MG tablet    Sig: Take 1 tablet (500 mg total) by mouth 2 (two) times daily for 3 days. Increase WATER intake while taking this medication.    Dispense:  6 tablet    Refill:  0   Patient encouraged to increase her fluid intake as much as possible. Discussed that water is always best to flush the urinary tract. She was advised to avoid caffeine containing fluids until her infections clears, as caffeine can cause her to experience painful bladder spasms.   May use Tylenol as needed for pain/fever should she experience these symptoms.   Patient instructed to call surgeon's office or PAT with any questions or concerns related to the above outlined course of treatment. Additionally, she was instructed to call if she feels like she is getting worse overall while on treatment. Results and treatment plan of care forwarded to primary attending surgeon to make them aware.   Encounter Diagnoses  Name Primary?   Pre-operative laboratory examination Yes   UTI (urinary tract infection) due to Enterococcus    Infection due to Enterobacter cloacae    [name], MSN, APRN, FNP-C, CEN [facility]  Peri-operative Services Nurse Practitioner Phone: [phone] Fax: [phone] 07/18/23 12:56 PM  NOTE: This note has been prepared using Scientist, clinical (histocompatibility and immunogenetics). Despite my best ability to proofread, there is always the potential  that unintentional transcriptional errors may still occur from this process.

## 2023-07-18 NOTE — Patient Instructions (Addendum)
Your procedure is scheduled on:07-27-23 Wednesday Report to the Registration Desk on the 1st floor of the [facility].Then proceed to the 2nd floor Surgery Desk To find out your arrival time, please call [phone] between 1PM - 3PM on:07-26-23 Tuesday If your arrival time is 6:00 am, do not arrive before that time as the [facility] entrance doors do not open until 6:00 am.  REMEMBER: Instructions that are not followed completely may result in serious medical risk, up to and including death; or upon the discretion of your surgeon and anesthesiologist your surgery may need to be rescheduled.  Do not eat food after midnight the night before surgery.  No gum chewing or hard candies.  You may however, drink Water up to 2 hours before you are scheduled to arrive for your surgery. Do not drink anything within 2 hours of your scheduled arrival time.  In addition, your doctor has ordered for you to drink the provided:  Gatorade G2 Drinking this carbohydrate drink up to two hours before surgery helps to reduce insulin resistance and improve patient outcomes. Please complete drinking 2 hours before scheduled arrival time.  One week prior to surgery: Stop Anti-inflammatories (NSAIDS) such as Advil, Aleve, Ibuprofen, Motrin, Naproxen, Naprosyn and Aspirin based products such as Excedrin, Goody's Powder, BC Powder.You may however, take Tylenol if needed for pain up until the day of surgery. Stop ANY OVER THE COUNTER supplements/vitamins NOW (07-18-23)until after surgery (Cranberry, Probiotic)   Continue taking all prescribed medications with the exception of the following: -metFORMIN (GLUCOPHAGE)-Stop 2 days prior to surgery-Last dose will be on 07-24-23 Sunday -ELIQUIS-Stop 2 days prior to surgery-Last dose will be on 07-24-23 Sunday  TAKE ONLY THESE MEDICATIONS THE MORNING OF SURGERY WITH A SIP OF WATER: -diltiazem (CARDIZEM CD)  -omeprazole (PRILOSEC)-take one the night before and one on the  morning of surgery - helps to prevent nausea after surgery.)  No Alcohol for 24 hours before or after surgery.  No Smoking including e-cigarettes for 24 hours before surgery.  No chewable tobacco products for at least 6 hours before surgery.  No nicotine patches on the day of surgery.  Do not use any "recreational" drugs for at least a week (preferably 2 weeks) before your surgery.  Please be advised that the combination of cocaine and anesthesia may have negative outcomes, up to and including death. If you test positive for cocaine, your surgery will be cancelled.  On the morning of surgery brush your teeth with toothpaste and water, you may rinse your mouth with mouthwash if you wish. Do not swallow any toothpaste or mouthwash.  Use CHG Soap as directed on instruction sheet.  Do not wear jewelry, make-up, hairpins, clips or nail polish.  Do not wear lotions, powders, or perfumes.   Do not shave body hair from the neck down 48 hours before surgery.  Contact lenses, hearing aids and dentures may not be worn into surgery.  Do not bring valuables to the hospital. [facility] is not responsible for any missing/lost belongings or valuables.    Notify your doctor if there is any change in your medical condition (cold, fever, infection).  Wear comfortable clothing (specific to your surgery type) to the hospital.  After surgery, you can help prevent lung complications by doing breathing exercises.  Take deep breaths and cough every 1-2 hours. Your doctor may order a device called an Incentive Spirometer to help you take deep breaths. When coughing or sneezing, hold a pillow firmly against your incision  with both hands. This is called "splinting." Doing this helps protect your incision. It also decreases belly discomfort.  If you are being admitted to the hospital overnight, leave your suitcase in the car. After surgery it may be brought to your room.  In case of increased patient  census, it may be necessary for you, the patient, to continue your postoperative care in the Same Day Surgery department.  If you are being discharged the day of surgery, you will not be allowed to drive home. You will need a responsible individual to drive you home and stay with you for 24 hours after surgery.   If you are taking public transportation, you will need to have a responsible individual with you.  Please call the Pre-admissions Testing Dept. at [phone] if you have any questions about these instructions.  Surgery Visitation Policy:  Patients having surgery or a procedure may have two visitors.  Children under the age of 9 must have an adult with them who is not the patient.     Preparing for Surgery with CHLORHEXIDINE GLUCONATE (CHG) Soap  Chlorhexidine Gluconate (CHG) Soap  o An antiseptic cleaner that kills germs and bonds with the skin to continue killing germs even after washing  o Used for showering the night before surgery and morning of surgery  Before surgery, you can play an important role by reducing the number of germs on your skin.  CHG (Chlorhexidine gluconate) soap is an antiseptic cleanser which kills germs and bonds with the skin to continue killing germs even after washing.  Please do not use if you have an allergy to CHG or antibacterial soaps. If your skin becomes reddened/irritated stop using the CHG.  1. Shower the NIGHT BEFORE SURGERY and the MORNING OF SURGERY with CHG soap.  2. If you choose to wash your hair, wash your hair first as usual with your normal shampoo.  3. After shampooing, rinse your hair and body thoroughly to remove the shampoo.  4. Use CHG as you would any other liquid soap. You can apply CHG directly to the skin and wash gently with a scrungie or a clean washcloth.  5. Apply the CHG soap to your body only from the neck down. Do not use on open wounds or open sores. Avoid contact with your eyes, ears, mouth, and  genitals (private parts). Wash face and genitals (private parts) with your normal soap.  6. Wash thoroughly, paying special attention to the area where your surgery will be performed.  7. Thoroughly rinse your body with warm water.  8. Do not shower/wash with your normal soap after using and rinsing off the CHG soap.  9. Pat yourself dry with a clean towel.  10. Wear clean pajamas to bed the night before surgery.  12. Place clean sheets on your bed the night of your first shower and do not sleep with pets.  13. Shower again with the CHG soap on the day of surgery prior to arriving at the hospital.  14. Do not apply any deodorants/lotions/powders.  15. Please wear clean clothes to the hospital.

## 2023-07-19 ENCOUNTER — Encounter: Payer: Self-pay | Admitting: Obstetrics and Gynecology

## 2023-07-20 ENCOUNTER — Telehealth: Payer: Self-pay | Admitting: Nurse Practitioner

## 2023-07-20 ENCOUNTER — Encounter
Admission: RE | Admit: 2023-07-20 | Discharge: 2023-07-20 | Disposition: A | Discharge status: Home / Self Care | Payer: Medicare Other | Source: Ambulatory Visit | Attending: Obstetrics and Gynecology | Admitting: Obstetrics and Gynecology

## 2023-07-20 ENCOUNTER — Encounter: Payer: Self-pay | Admitting: Obstetrics and Gynecology

## 2023-07-20 DIAGNOSIS — Z01812 Encounter for preprocedural laboratory examination: Secondary | ICD-10-CM | POA: Diagnosis present

## 2023-07-20 DIAGNOSIS — C541 Malignant neoplasm of endometrium: Secondary | ICD-10-CM

## 2023-07-20 LAB — TYPE AND SCREEN
ABO/RH(D): O NEG
Antibody Screen: POSITIVE

## 2023-07-20 NOTE — Telephone Encounter (Signed)
Left voicemail for patient and daughter reminding them to hold eliquis 2 days prior to surgery.

## 2023-07-20 NOTE — Progress Notes (Signed)
Perioperative / Anesthesia Services  Pre-Admission Testing Clinical Review / Preoperative Anesthesia Consult  Date: 07/24/23  Patient Demographics:  Name: Barbara Mooney DOB:   Apr 20, 1934 MRN:   [id number]  Planned Surgical Procedure(s):    Case: [id number] Date/Time: 07/27/23 0715   Procedure: TOTAL LAPAROSCOPIC HYSTERECTOMY WITH BILATERAL SALPINGO OOPHORECTOMY, SENTINEL LYMPH NODE INJECTION AND MAPPING, POSSIBLE NODE DISSECTION, POSSIBLE LAPAROTOMY   Anesthesia type: General   Pre-op diagnosis: Endometrial cancer   Location: [facility] OR ROOM 05 / [facility]   Surgeons: [name], MD     NOTE: Available PAT nursing documentation and vital signs have been reviewed. Clinical nursing staff has updated patient's PMH/PSHx, current medication list, and drug allergies/intolerances to ensure comprehensive history available to assist in medical decision making as it pertains to the aforementioned surgical procedure and anticipated anesthetic course. Extensive review of available clinical information personally performed. [place] [facility] and [facility] updated with any diagnoses/procedures that  may have been inadvertently omitted during her intake with the pre-admission testing department's nursing staff.  Clinical Discussion:  Barbara Mooney is a 89 y.o. female who is submitted for pre-surgical anesthesia review and clearance prior to her undergoing the above procedure. Patient has never been a smoker. Pertinent PMH includes: atrial fibrillation, aortic atherosclerosis, bradycardia, RBBB, HTN, HLD, T2DM, hypothyroidism, peripheral edema, CKD-III, GERD (on daily PPI), hiatal hernia, anemia, non-Hodgkin's lymphoma, DCIS of the RIGHT breast, endometrial cancer,  Patient is followed by cardiology Mariah Milling, MD). She was last seen in the cardiology clinic on 06/23/2023; notes reviewed. At the time of her clinic visit, patient experiencing episodes of exertional dyspnea and generalized  fatigue. Additionally, she had some edema in her BILATERAL lower extremities that was reported to be stable and at baseline. Patient denied any chest pain, PND, orthopnea, palpitations, weakness, vertiginous symptoms, or presyncope/syncope. Patient with a past medical history significant for cardiovascular diagnoses. Documented physical exam was grossly benign, providing no evidence of acute exacerbation and/or decompensation of the patient's known cardiovascular conditions.  Most recent TTE was performed on 07/08/2023 revealing normal left ventricular systolic function with an LVEF of 60-65%. There were no regional wall motion abnormalities. Diastolic doppler parameters indeterminate. GLS -22.1%. Right ventricle mildly enlarged with normal systolic function. RVSP 30.5 mmHg. There was severe biatrial enlargement. Moderate mitral annular calcification observed. Mild to moderate tricuspid valve regurgitation present. All transvalvular gradients were noted to be normal providing no evidence suggestive of valvular stenosis. Aorta normal in size with no evidence of aneurysmal dilatation.   Patient with an atrial fibrillation diagnosis; CHA2DS2-VASc Score = 6 (age x 2, sex, HTN, vascular disease history, T2DM). Patient status post DCCV procedure on 02/09/2017, at which time she received a single 150 J synchronized cardioversion, which restored NSR. Patient ultimately with refractory atrial arhythmia. Cardiac rate and rhythm are currently being maintained on oral diltiazem. She is chronically anticoagulated using apixaban; reported to be compliant with therapy with no evidence or reports of GI bleeding.  Blood pressure well controlled at 120-58 mmHg on currently prescribed diuretic (furosemide) and ARB (olmesartan) therapies. She is not on an type of lipid lowering therapies for her HLD diagnosis and ASCVD prevention. T2DM well controlled on currently prescribed regimen; last HgbA1c was 6.7% when checked on 06/22/2023.  Functional capacity is somewhat limited by patient's age and multiple medical comorbidities. With that said, patient is able to complete all of her ADL/IADLs without cardiovascular limitation.  Per the [facility], patient is able to achieve > 4 METS of physical  activity without experiencing any significant degree of angina/anginal equivalent symptoms. No changes were made to her medication regimen during her visit with cardiology.  Patient scheduled to follow-up with outpatient cardiology in 3 months or sooner if needed.  Barbara Mooney underwent endometrial biopsy that revealed pathology (+) for well differentiated endometrioid carcinoma (FIGO I). IHC testing demonstrated loss of MLH1 and PMS2, while MSH2 and MSH6 found to be intact. Following biopsy, patient was referred to GYN oncology for consultation and discussions regarding definitive treatment options. Patient has subsequently been scheduled for a TOTAL LAPAROSCOPIC HYSTERECTOMY WITH BILATERAL SALPINGO OOPHORECTOMY, SENTINEL LYMPH NODE INJECTION AND MAPPING, POSSIBLE NODE DISSECTION, POSSIBLE LAPAROTOMY on 07/27/2023 with Dr. [name], MD.  Given patient's past medical history significant for cardiovascular diagnoses, presurgical cardiac clearance was sought by the PAT team.  Per cardiology, "according to RCRI she is Class 2 risk, 6% 30 day risk of death, MI or cardiac arrest. I will update echocardiogram. EKG today shows no ischemic changes. METS >4.  If echocardiogram is unremarkable, it is okay for patient to proceed with surgery at an overall ACCEPTABLE risk".  Again, this patient is on daily oral anticoagulation therapy using a DOAC medication. She has been instructed on recommendations for holding her apixaban for 2 days prior to her procedure with plans to restart as soon as postoperative bleeding risk felt to be minimized by her attending surgeon. The patient has been instructed that her last dose of her apixaban should be on  07/24/2023.  Patient denies previous perioperative complications with anesthesia in the past. In review of the available records, it is noted that patient underwent a general anesthetic course here at [facility] ([facility] in 02/2021 without documented complications.      07/18/2023   11:00 AM 06/23/2023    3:01 PM 06/22/2023   10:58 AM  Vitals with BMI  Height 5\' 6"  5\' 6"    Weight 167 lbs 9 oz 166 lbs 6 oz 167 lbs 3 oz  BMI 27.06 26.87   Systolic  120 143  Diastolic  58 77  Pulse  76 83    Providers/Specialists:   NOTE: Primary physician provider listed below. Patient may have been seen by APP or partner within same practice.   PROVIDER ROLE / SPECIALTY LAST Lamar Sprinkles, MD Gynecology/Oncology (Surgeon) 06/22/2023  Patrice Paradise, MD Primary Care Provider 04/26/2023  Julien Nordmann, MD Cardiology 06/23/2023  Carmina Miller, MD Radiation Oncology 06/01/2023  Louretta Shorten, MD Medical Oncology 06/19/2021   Allergies:  Nitrofurantoin and Sulfa antibiotics  Current Home Medications:   No current facility-administered medications for this encounter.    acetaminophen (TYLENOL) 500 MG tablet   CRANBERRY PO   cyanocobalamin (,VITAMIN B-12,) 1000 MCG/ML injection   diltiazem (CARDIZEM CD) 180 MG 24 hr capsule   ELIQUIS 5 MG TABS tablet   furosemide (LASIX) 20 MG tablet   LACTOBACILLUS PO   metFORMIN (GLUCOPHAGE) 500 MG tablet   olmesartan (BENICAR) 40 MG tablet   omeprazole (PRILOSEC) 20 MG capsule   ondansetron (ZOFRAN) 4 MG tablet   Trolamine Salicylate (BLUE-EMU HEMP EX)   History:   Past Medical History:  Diagnosis Date   Anemia    Aortic atherosclerosis (HCC)    Arthritis    Bradycardia    Bruises easily    Chronic kidney disease (CKD), stage III (moderate) (HCC)    DM (diabetes mellitus), type 2 (HCC)    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/2021   a.) stage 0 (high grade cTis (DCIS), cN0,  cM0) with comedonecrosis -- > s/p lumpectomy + XRT   Endometrial cancer (HCC) 05/25/2023   a.) pathology (+) for well differentiated endometroid carcinoma (FIGO 1)   Family history of breast cancer    Family history of colon cancer    Family history of Hodgkin's disease    Family history of ovarian cancer    Follicular non-Hodgkin's lymphoma (HCC) 2018   a.) 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 RVSP 37.32mmHg. Mod dil LA. Mild to mod MR/TR. Mild AoV sclerosis; c.) TTE 07/08/2023: EF 60-65%, mild RVE, sev BAE, mod MAC, mild-mod TR, mild MR, RVSP 30.5   History of hiatal hernia    HLD (hyperlipidemia)    HOH (hard of hearing)    Hypertension    Hypokalemia    Hypothyroidism    Leg pain    a. 08/2020 ABIs: R 1.19, L 0.97.   Mixed incontinence    MRSA infection    a.) remote history; posterior torso   Multiple thyroid nodules    On apixaban therapy    Peripheral edema    Permanent atrial fibrillation (HCC) 10/2016   a.) CHA2DS2VASc = 6 (age x2, sex, HTN, vascular disease history, T2DM);  b.) s/p DCCV 01/20/2017 (150 J x 1) --> failed; c.) rate/rhythm maintained on oral diltiazem; chronically anticoagulated with apixaban   RBBB (right bundle branch block)    Recurrent UTI (urinary tract infection)    Sepsis (HCC)    Unsteady gait    Varicose veins of both lower extremities    Vitamin B12 deficiency    Past Surgical History:  Procedure Laterality Date   BONE BIOPSY Right 2018   tibia   BREAST BIOPSY Right 02/26/2021   ffirm bx-"X" clip-HIGH-GRADE DUCTAL CARCINOMA IN SITU   BREAST LUMPECTOMY Right 03/09/2021   Procedure: BREAST LUMPECTOMY;  Surgeon: Earline Mayotte,  MD;  Location: [facility] ORS;  Service: General;  Laterality: Right;   CARDIOVERSION N/A 01/20/2017   Procedure: CARDIOVERSION;  Surgeon: Antonieta Iba, MD;  Location: [facility] ORS;  Service: Cardiovascular;  Laterality: N/A;   CATARACT EXTRACTION W/PHACO Right 02/15/2018   Procedure: CATARACT EXTRACTION PHACO AND INTRAOCULAR LENS PLACEMENT (IOC);  Surgeon: Galen Manila, MD;  Location: [facility] ORS;  Service: Ophthalmology;  Laterality: Right;  [place] 01:22.8 AP% 18.9 CDE 15.62 Fluid Pack Lot # [id number]   CATARACT EXTRACTION W/PHACO Left 03/08/2018   Procedure: CATARACT EXTRACTION PHACO AND INTRAOCULAR LENS PLACEMENT (IOC);  Surgeon: Galen Manila, MD;  Location: [facility] ORS;  Service: Ophthalmology;  Laterality: Left;  [place] 01:05 AP% 15.8 CDE 10.28 Fluid pak lot # [id number]   COLONOSCOPY     Family History  Problem Relation Age of Onset   Breast cancer Sister 16   Stroke Sister    Atrial fibrillation Sister    Heart disease Brother    Ovarian cancer Maternal Aunt    Colon cancer Maternal Aunt    Kidney cancer Neg Hx    Bladder Cancer Neg Hx    Social History   Tobacco Use   Smoking status: Never   Smokeless tobacco: Never  Vaping Use  Vaping status: Never Used  Substance Use Topics   Alcohol use: No   Drug use: No    Pertinent Clinical Results:  LABS:   Lab Results  Component Value Date   WBC 8.0 06/22/2023   HGB 12.4 06/22/2023   HCT 39.3 06/22/2023   MCV 87.1 06/22/2023   PLT 298 06/22/2023   Lab Results  Component Value Date   NA 140 06/22/2023   K 4.0 06/22/2023   CO2 28 06/22/2023   GLUCOSE 104 (H) 06/22/2023   BUN 16 06/22/2023   CREATININE 0.85 06/22/2023   CALCIUM 9.6 06/22/2023   GFRNONAA >60 06/22/2023    ECG: Date: 06/23/2023 Time ECG obtained: 1506 PM Rate: 76 bpm Rhythm: atrial fibrillation; RBBB Intervals:  QRS 126 ms. QTc 450 ms. ST segment and T wave changes: Inferior and anterior T wave abnormalities  Comparison: Similar to previous tracing  obtained on 07/27/2022   IMAGING / PROCEDURES: TRANSTHORACIC ECHOCARDIOGRAM performed on 07/08/2023 Left ventricular ejection fraction, by estimation, is 60 to 65%. The left ventricle has normal function. The left ventricle has no regional wall motion abnormalities. Left ventricular diastolic parameters are indeterminate. The average left ventricular global longitudinal strain is -22.1 %. The global longitudinal strain is normal.  Right ventricular systolic function is normal. The right ventricular size is mildly enlarged. There is normal pulmonary artery systolic pressure.  Left atrial size was severely dilated.  Right atrial size was severely dilated.  The mitral valve is degenerative. Mild mitral valve regurgitation. No evidence of mitral stenosis. Moderate mitral annular calcification.  Tricuspid valve regurgitation is mild to moderate.  The aortic valve is tricuspid. Aortic valve regurgitation is not visualized. No aortic stenosis is present.  The inferior vena cava is normal in size with greater than 50% respiratory variability, suggesting right atrial pressure of 3 mmHg.   [place] FDC SONOHYSTEROGRAPHY performed on 05/25/2023 Thickened endometrium with focal mass measuring 4.3 cm.  Uterus  Visualized. Size 84 mm x 60 mm x 44 mm  Position: anteverted  Malformations: none  Myometrium: Heterogeneous  Endometrium: Thickened. Endometrial thickness, total 27.1 mm  Cervix details: nabothian cysts visualized  Fibroid(s) Size 28.00 mm x 13.00 mm x 17 mm. Mean 19.3 mm. Vol 3.2 cm. FIGO - 2-5 (intramural with submucosal & subserosal component). Left lateral posterior wall  Endometrial mass=4.30 x 2.96 x 3.97cm  Bilateral ovaries/adnexae appear WNL   Impression and Plan:  Barbara Mooney has been referred for pre-anesthesia review and clearance prior to her undergoing the planned anesthetic and procedural courses. Available labs, pertinent testing, and imaging results were personally reviewed  by me in preparation for upcoming operative/procedural course. [facility] medical record has been updated following extensive record review and patient interview with PAT staff.   This patient has been appropriately cleared by cardiology with an overall ACCEPTABLE risk of experiencing significant perioperative cardiovascular complications. Based on clinical review performed today (07/24/23), barring any significant acute changes in the patient's overall condition, it is anticipated that she will be able to proceed with the planned surgical intervention. Any acute changes in clinical condition may necessitate her procedure being postponed and/or cancelled. Patient will meet with anesthesia team (MD and/or CRNA) on the day of her procedure for preoperative evaluation/assessment. Questions regarding anesthetic course will be fielded at that time.   Pre-surgical instructions were reviewed with the patient during her PAT appointment, and questions were fielded to satisfaction by PAT clinical staff. She has been instructed on which medications that she will need  to hold prior to surgery, as well as the ones that have been deemed safe/appropriate to take on the day of her procedure. As part of the general education provided by PAT, patient made aware both verbally and in writing, that she would need to abstain from the use of any illegal substances during her perioperative course.  She was advised that failure to follow the provided instructions could necessitate case cancellation or result in serious perioperative complications up to and including death. Patient encouraged to contact PAT and/or her surgeon's office to discuss any questions or concerns that may arise prior to surgery; verbalized understanding.   [name], MSN, APRN, FNP-C, CEN [facility]  Peri-operative Services Nurse Practitioner Phone: [phone] Fax: [phone] 07/24/23 9:21 PM  NOTE: This note has been  prepared using Dragon dictation software. Despite my best ability to proofread, there is always the potential that unintentional transcriptional errors may still occur from this process.

## 2023-07-24 ENCOUNTER — Encounter: Payer: Self-pay | Admitting: Obstetrics and Gynecology

## 2023-07-26 MED ORDER — CEFAZOLIN SODIUM-DEXTROSE 2-4 GM/100ML-% IV SOLN
2.0000 g | INTRAVENOUS | Status: AC
  Administered 2023-07-27: 2 g via INTRAVENOUS

## 2023-07-26 MED ORDER — HEPARIN SODIUM (PORCINE) 5000 UNIT/ML IJ SOLN
5000.0000 [IU] | INTRAMUSCULAR | Status: AC
  Administered 2023-07-27: 5000 [IU] via SUBCUTANEOUS

## 2023-07-26 MED ORDER — SODIUM CHLORIDE 0.9 % IV SOLN: INTRAVENOUS | Status: DC

## 2023-07-26 MED ORDER — POVIDONE-IODINE 10 % EX SWAB
2.0000 | Freq: Once | CUTANEOUS | Status: AC
  Administered 2023-07-27: 2 via TOPICAL

## 2023-07-26 MED ORDER — ORAL CARE MOUTH RINSE: 15.0000 mL | Freq: Once | OROMUCOSAL | Status: AC

## 2023-07-26 MED ORDER — CHLORHEXIDINE GLUCONATE 0.12 % MT SOLN
15.0000 mL | Freq: Once | OROMUCOSAL | Status: AC
  Administered 2023-07-27: 15 mL via OROMUCOSAL

## 2023-07-27 ENCOUNTER — Other Ambulatory Visit: Payer: Self-pay

## 2023-07-27 ENCOUNTER — Encounter
Admission: RE | Disposition: A | Discharge status: Home / Self Care | Payer: Self-pay | Source: Home / Self Care | Attending: Obstetrics and Gynecology

## 2023-07-27 ENCOUNTER — Ambulatory Visit: Payer: Medicare Other | Admitting: Urgent Care

## 2023-07-27 ENCOUNTER — Observation Stay
Admission: RE | Admit: 2023-07-27 | Discharge: 2023-07-28 | Disposition: A | Discharge status: Home / Self Care | Payer: Medicare Other | Attending: Obstetrics and Gynecology | Admitting: Obstetrics and Gynecology

## 2023-07-27 ENCOUNTER — Encounter: Payer: Self-pay | Admitting: Obstetrics and Gynecology

## 2023-07-27 DIAGNOSIS — Z7901 Long term (current) use of anticoagulants: Secondary | ICD-10-CM | POA: Insufficient documentation

## 2023-07-27 DIAGNOSIS — C541 Malignant neoplasm of endometrium: Secondary | ICD-10-CM | POA: Diagnosis present

## 2023-07-27 DIAGNOSIS — Z8572 Personal history of non-Hodgkin lymphomas: Secondary | ICD-10-CM | POA: Diagnosis not present

## 2023-07-27 DIAGNOSIS — E119 Type 2 diabetes mellitus without complications: Secondary | ICD-10-CM | POA: Diagnosis not present

## 2023-07-27 DIAGNOSIS — I4891 Unspecified atrial fibrillation: Secondary | ICD-10-CM | POA: Diagnosis not present

## 2023-07-27 DIAGNOSIS — E039 Hypothyroidism, unspecified: Secondary | ICD-10-CM | POA: Insufficient documentation

## 2023-07-27 DIAGNOSIS — Z01818 Encounter for other preprocedural examination: Secondary | ICD-10-CM

## 2023-07-27 DIAGNOSIS — Z853 Personal history of malignant neoplasm of breast: Secondary | ICD-10-CM | POA: Insufficient documentation

## 2023-07-27 DIAGNOSIS — Z79899 Other long term (current) drug therapy: Secondary | ICD-10-CM | POA: Insufficient documentation

## 2023-07-27 DIAGNOSIS — I10 Essential (primary) hypertension: Secondary | ICD-10-CM | POA: Diagnosis not present

## 2023-07-27 HISTORY — PX: TOTAL LAPAROSCOPIC HYSTERECTOMY WITH BILATERAL SALPINGO OOPHORECTOMY: SHX6845

## 2023-07-27 HISTORY — DX: Atherosclerosis of aorta: I70.0

## 2023-07-27 HISTORY — DX: Unspecified right bundle-branch block: I45.10

## 2023-07-27 LAB — GLUCOSE, CAPILLARY
Glucose-Capillary: 121 mg/dL — ABNORMAL HIGH (ref 70–99)
Glucose-Capillary: 140 mg/dL — ABNORMAL HIGH (ref 70–99)
Glucose-Capillary: 154 mg/dL — ABNORMAL HIGH (ref 70–99)
Glucose-Capillary: 162 mg/dL — ABNORMAL HIGH (ref 70–99)

## 2023-07-27 LAB — BASIC METABOLIC PANEL
Anion gap: 10 (ref 5–15)
BUN: 16 mg/dL (ref 8–23)
CO2: 25 mmol/L (ref 22–32)
Calcium: 8.8 mg/dL — ABNORMAL LOW (ref 8.9–10.3)
Chloride: 103 mmol/L (ref 98–111)
Creatinine, Ser: 0.94 mg/dL (ref 0.44–1.00)
GFR, Estimated: 59 mL/min — ABNORMAL LOW (ref >60)
Glucose, Bld: 176 mg/dL — ABNORMAL HIGH (ref 70–99)
Potassium: 3.9 mmol/L (ref 3.5–5.1)
Sodium: 138 mmol/L (ref 135–145)

## 2023-07-27 LAB — CBC
HCT: 38.2 % (ref 36.0–46.0)
Hemoglobin: 12.1 g/dL (ref 12.0–15.0)
MCH: 27.9 pg (ref 26.0–34.0)
MCHC: 31.7 g/dL (ref 30.0–36.0)
MCV: 88 fL (ref 80.0–100.0)
Platelets: 270 10*3/uL (ref 150–400)
RBC: 4.34 MIL/uL (ref 3.87–5.11)
RDW: 15.6 % — ABNORMAL HIGH (ref 11.5–15.5)
WBC: 10.7 10*3/uL — ABNORMAL HIGH (ref 4.0–10.5)
nRBC: 0 % (ref 0.0–0.2)

## 2023-07-27 SURGERY — HYSTERECTOMY, TOTAL, LAPAROSCOPIC, WITH BILATERAL SALPINGO-OOPHORECTOMY
Anesthesia: General | Site: Abdomen

## 2023-07-27 MED ORDER — ONDANSETRON HCL 4 MG/2ML IJ SOLN
INTRAMUSCULAR | Status: DC | PRN
  Administered 2023-07-27: 4 mg via INTRAVENOUS

## 2023-07-27 MED ORDER — LIDOCAINE HCL (CARDIAC) PF 100 MG/5ML IV SOSY
PREFILLED_SYRINGE | INTRAVENOUS | Status: DC | PRN
  Administered 2023-07-27: 80 mg via INTRAVENOUS

## 2023-07-27 MED ORDER — ACETAMINOPHEN 10 MG/ML IV SOLN
INTRAVENOUS | Status: AC
  Filled 2023-07-27: qty 100

## 2023-07-27 MED ORDER — PHENYLEPHRINE HCL-NACL 20-0.9 MG/250ML-% IV SOLN
INTRAVENOUS | Status: AC
  Filled 2023-07-27: qty 250

## 2023-07-27 MED ORDER — DEXAMETHASONE SODIUM PHOSPHATE 10 MG/ML IJ SOLN
INTRAMUSCULAR | Status: DC | PRN
  Administered 2023-07-27: 5 mg via INTRAVENOUS

## 2023-07-27 MED ORDER — PROPOFOL 1000 MG/100ML IV EMUL
INTRAVENOUS | Status: AC
  Filled 2023-07-27: qty 100

## 2023-07-27 MED ORDER — CHLORHEXIDINE GLUCONATE 0.12 % MT SOLN
OROMUCOSAL | Status: AC
  Filled 2023-07-27: qty 15

## 2023-07-27 MED ORDER — METFORMIN HCL 500 MG PO TABS
500.0000 mg | ORAL_TABLET | Freq: Two times a day (BID) | ORAL | Status: DC
  Administered 2023-07-27 – 2023-07-28 (×2): 500 mg via ORAL
  Filled 2023-07-27 (×2): qty 1

## 2023-07-27 MED ORDER — ONDANSETRON HCL 4 MG PO TABS: 4.0000 mg | ORAL_TABLET | Freq: Four times a day (QID) | ORAL | Status: DC | PRN

## 2023-07-27 MED ORDER — ACETAMINOPHEN 500 MG PO TABS
500.0000 mg | ORAL_TABLET | Freq: Three times a day (TID) | ORAL | Status: DC
  Administered 2023-07-27 – 2023-07-28 (×2): 500 mg via ORAL
  Filled 2023-07-27 (×2): qty 1

## 2023-07-27 MED ORDER — INDOCYANINE GREEN 25 MG IV SOLR
INTRAVENOUS | Status: DC | PRN
  Administered 2023-07-27: 15 mg

## 2023-07-27 MED ORDER — KETOROLAC TROMETHAMINE 30 MG/ML IJ SOLN
INTRAMUSCULAR | Status: AC
  Filled 2023-07-27: qty 1

## 2023-07-27 MED ORDER — HYDROMORPHONE HCL 1 MG/ML IJ SOLN
INTRAMUSCULAR | Status: DC | PRN
  Administered 2023-07-27: .3 mg via INTRAVENOUS
  Administered 2023-07-27: .2 mg via INTRAVENOUS

## 2023-07-27 MED ORDER — ACETAMINOPHEN EXTRA STRENGTH 500 MG PO TABS: 1000.0000 mg | ORAL_TABLET | Freq: Four times a day (QID) | ORAL | 0 refills | Status: AC

## 2023-07-27 MED ORDER — GLYCOPYRROLATE 0.2 MG/ML IJ SOLN
INTRAMUSCULAR | Status: DC | PRN
  Administered 2023-07-27: .2 mg via INTRAVENOUS

## 2023-07-27 MED ORDER — LACTATED RINGERS IV SOLN: INTRAVENOUS | Status: DC | PRN

## 2023-07-27 MED ORDER — ONDANSETRON HCL 4 MG/2ML IJ SOLN: 4.0000 mg | Freq: Four times a day (QID) | INTRAMUSCULAR | Status: DC | PRN

## 2023-07-27 MED ORDER — OXYCODONE HCL 5 MG PO TABS: 5.0000 mg | ORAL_TABLET | ORAL | 0 refills | Status: DC | PRN

## 2023-07-27 MED ORDER — PROPOFOL 10 MG/ML IV BOLUS
INTRAVENOUS | Status: DC | PRN
  Administered 2023-07-27: 50 mg via INTRAVENOUS
  Administered 2023-07-27: 20 ug/kg/min via INTRAVENOUS
  Administered 2023-07-27: 120 mg via INTRAVENOUS

## 2023-07-27 MED ORDER — CRANBERRY 250 MG PO TABS: ORAL_TABLET | Freq: Two times a day (BID) | ORAL | Status: DC

## 2023-07-27 MED ORDER — 0.9 % SODIUM CHLORIDE (POUR BTL) OPTIME
TOPICAL | Status: DC | PRN
  Administered 2023-07-27: 500 mL

## 2023-07-27 MED ORDER — CEFAZOLIN SODIUM-DEXTROSE 2-4 GM/100ML-% IV SOLN
INTRAVENOUS | Status: AC
  Filled 2023-07-27: qty 100

## 2023-07-27 MED ORDER — IBUPROFEN 600 MG PO TABS
ORAL_TABLET | ORAL | Status: AC
  Filled 2023-07-27: qty 1

## 2023-07-27 MED ORDER — OXYCODONE HCL 5 MG PO TABS
ORAL_TABLET | ORAL | Status: AC
  Filled 2023-07-27: qty 1

## 2023-07-27 MED ORDER — FENTANYL CITRATE (PF) 100 MCG/2ML IJ SOLN
INTRAMUSCULAR | Status: DC | PRN
  Administered 2023-07-27 (×2): 50 ug via INTRAVENOUS

## 2023-07-27 MED ORDER — CYANOCOBALAMIN 1000 MCG/ML IJ SOLN: 1000.0000 ug | INTRAMUSCULAR | Status: DC

## 2023-07-27 MED ORDER — FENTANYL CITRATE (PF) 100 MCG/2ML IJ SOLN
INTRAMUSCULAR | Status: AC
  Filled 2023-07-27: qty 2

## 2023-07-27 MED ORDER — BUPIVACAINE HCL (PF) 0.5 % IJ SOLN
INTRAMUSCULAR | Status: AC
  Filled 2023-07-27: qty 30

## 2023-07-27 MED ORDER — FUROSEMIDE 20 MG PO TABS: 20.0000 mg | ORAL_TABLET | Freq: Every day | ORAL | Status: DC | PRN

## 2023-07-27 MED ORDER — KETOROLAC TROMETHAMINE 30 MG/ML IJ SOLN
30.0000 mg | Freq: Four times a day (QID) | INTRAMUSCULAR | Status: AC
  Administered 2023-07-27 – 2023-07-28 (×4): 30 mg via INTRAVENOUS
  Filled 2023-07-27 (×3): qty 1

## 2023-07-27 MED ORDER — IRBESARTAN 150 MG PO TABS
300.0000 mg | ORAL_TABLET | Freq: Every day | ORAL | Status: DC
  Administered 2023-07-28: 300 mg via ORAL
  Filled 2023-07-27: qty 2

## 2023-07-27 MED ORDER — IBUPROFEN 400 MG PO TABS
600.0000 mg | ORAL_TABLET | Freq: Four times a day (QID) | ORAL | Status: DC
  Administered 2023-07-27 – 2023-07-28 (×2): 600 mg via ORAL
  Filled 2023-07-27: qty 1
  Filled 2023-07-27: qty 2

## 2023-07-27 MED ORDER — SIMETHICONE 80 MG PO CHEW: 80.0000 mg | CHEWABLE_TABLET | Freq: Four times a day (QID) | ORAL | Status: DC | PRN

## 2023-07-27 MED ORDER — DOCUSATE SODIUM 100 MG PO CAPS
100.0000 mg | ORAL_CAPSULE | Freq: Two times a day (BID) | ORAL | Status: DC
  Administered 2023-07-27 – 2023-07-28 (×2): 100 mg via ORAL
  Filled 2023-07-27 (×2): qty 1

## 2023-07-27 MED ORDER — FENTANYL CITRATE (PF) 100 MCG/2ML IJ SOLN
25.0000 ug | INTRAMUSCULAR | Status: AC | PRN
  Administered 2023-07-27 (×3): 25 ug via INTRAVENOUS
  Administered 2023-07-27 (×2): 50 ug via INTRAVENOUS
  Administered 2023-07-27: 25 ug via INTRAVENOUS

## 2023-07-27 MED ORDER — LACTATED RINGERS IV SOLN: INTRAVENOUS | Status: DC

## 2023-07-27 MED ORDER — MENTHOL 3 MG MT LOZG: 1.0000 | LOZENGE | OROMUCOSAL | Status: DC | PRN

## 2023-07-27 MED ORDER — PHENYLEPHRINE HCL-NACL 20-0.9 MG/250ML-% IV SOLN
INTRAVENOUS | Status: DC | PRN
  Administered 2023-07-27: 20 ug/min via INTRAVENOUS

## 2023-07-27 MED ORDER — HEPARIN SODIUM (PORCINE) 5000 UNIT/ML IJ SOLN
INTRAMUSCULAR | Status: AC
  Filled 2023-07-27: qty 1

## 2023-07-27 MED ORDER — OXYCODONE HCL 5 MG PO TABS
5.0000 mg | ORAL_TABLET | ORAL | Status: DC | PRN
  Filled 2023-07-27: qty 1

## 2023-07-27 MED ORDER — PANTOPRAZOLE SODIUM 40 MG PO TBEC
40.0000 mg | DELAYED_RELEASE_TABLET | Freq: Every day | ORAL | Status: DC
  Administered 2023-07-28: 40 mg via ORAL
  Filled 2023-07-27: qty 1

## 2023-07-27 MED ORDER — BUPIVACAINE HCL (PF) 0.5 % IJ SOLN
INTRAMUSCULAR | Status: DC | PRN
  Administered 2023-07-27: 7 mL

## 2023-07-27 MED ORDER — DOCUSATE SODIUM 100 MG PO CAPS: 100.0000 mg | ORAL_CAPSULE | Freq: Two times a day (BID) | ORAL | 0 refills | Status: DC

## 2023-07-27 MED ORDER — ROCURONIUM BROMIDE 100 MG/10ML IV SOLN
INTRAVENOUS | Status: DC | PRN
  Administered 2023-07-27: 50 mg via INTRAVENOUS
  Administered 2023-07-27: 10 mg via INTRAVENOUS
  Administered 2023-07-27: 20 mg via INTRAVENOUS
  Administered 2023-07-27: 10 mg via INTRAVENOUS

## 2023-07-27 MED ORDER — IBUPROFEN 800 MG PO TABS: 800.0000 mg | ORAL_TABLET | Freq: Three times a day (TID) | ORAL | 1 refills | Status: AC

## 2023-07-27 MED ORDER — ALUM & MAG HYDROXIDE-SIMETH 200-200-20 MG/5ML PO SUSP: 30.0000 mL | ORAL | Status: DC | PRN

## 2023-07-27 MED ORDER — DILTIAZEM HCL ER COATED BEADS 180 MG PO CP24
180.0000 mg | ORAL_CAPSULE | Freq: Two times a day (BID) | ORAL | Status: DC
  Administered 2023-07-27 – 2023-07-28 (×2): 180 mg via ORAL
  Filled 2023-07-27 (×3): qty 1

## 2023-07-27 MED ORDER — PROPOFOL 10 MG/ML IV BOLUS
INTRAVENOUS | Status: AC
  Filled 2023-07-27: qty 40

## 2023-07-27 MED ORDER — OXYCODONE HCL 5 MG PO TABS
5.0000 mg | ORAL_TABLET | Freq: Once | ORAL | Status: AC | PRN
  Administered 2023-07-27: 5 mg via ORAL

## 2023-07-27 MED ORDER — SUGAMMADEX SODIUM 200 MG/2ML IV SOLN
INTRAVENOUS | Status: DC | PRN
  Administered 2023-07-27: 200 mg via INTRAVENOUS

## 2023-07-27 MED ORDER — ONDANSETRON HCL 4 MG PO TABS: 4.0000 mg | ORAL_TABLET | Freq: Three times a day (TID) | ORAL | Status: DC | PRN

## 2023-07-27 MED ORDER — ACETAMINOPHEN 10 MG/ML IV SOLN
INTRAVENOUS | Status: DC | PRN
  Administered 2023-07-27: 1000 mg via INTRAVENOUS

## 2023-07-27 MED ORDER — OXYCODONE HCL 5 MG/5ML PO SOLN: 5.0000 mg | Freq: Once | ORAL | Status: AC | PRN

## 2023-07-27 MED ORDER — HYDROMORPHONE HCL 1 MG/ML IJ SOLN
INTRAMUSCULAR | Status: AC
  Filled 2023-07-27: qty 1

## 2023-07-27 MED ORDER — RISAQUAD PO CAPS
1.0000 | ORAL_CAPSULE | Freq: Every day | ORAL | Status: DC
  Administered 2023-07-28: 1 via ORAL
  Filled 2023-07-27: qty 1

## 2023-07-27 SURGICAL SUPPLY — 50 items
ADH SKN CLS APL DERMABOND .7 (GAUZE/BANDAGES/DRESSINGS) ×1
BAG DRN RND TRDRP ANRFLXCHMBR (UROLOGICAL SUPPLIES) ×1
BAG URINE DRAIN 2000ML AR STRL (UROLOGICAL SUPPLIES) ×1 IMPLANT
BLADE SURG 15 STRL LF DISP TIS (BLADE) ×1 IMPLANT
BLADE SURG 15 STRL SS (BLADE) ×1
CABLE HIGH FREQUENCY MONO STRZ (ELECTRODE) IMPLANT
CANNULA DILATOR 5 W/SLV (CANNULA) IMPLANT
CATH FOLEY 2WAY 5CC 16FR (CATHETERS) ×1
CATH URTH 16FR FL 2W BLN LF (CATHETERS) ×1 IMPLANT
CNTNR URN SCR LID CUP LEK RST (MISCELLANEOUS) ×1 IMPLANT
CONT SPEC 4OZ STRL OR WHT (MISCELLANEOUS) ×1
DERMABOND ADVANCED .7 DNX12 (GAUZE/BANDAGES/DRESSINGS) ×1 IMPLANT
DEVICE SUTURE ENDOST 10MM (ENDOMECHANICALS) IMPLANT
DRAPE STERI POUCH LG 24X46 STR (DRAPES) ×2 IMPLANT
DRSG TELFA 3X8 NADH STRL (GAUZE/BANDAGES/DRESSINGS) ×1 IMPLANT
GAUZE 4X4 16PLY ~~LOC~~+RFID DBL (SPONGE) ×2 IMPLANT
GLOVE BIO SURGEON STRL SZ8 (GLOVE) ×4 IMPLANT
GOWN STRL REUS W/ TWL LRG LVL3 (GOWN DISPOSABLE) ×1 IMPLANT
GOWN STRL REUS W/ TWL XL LVL3 (GOWN DISPOSABLE) ×3 IMPLANT
GOWN STRL REUS W/TWL LRG LVL3 (GOWN DISPOSABLE) ×3
GOWN STRL REUS W/TWL XL LVL3 (GOWN DISPOSABLE) ×2
GRASPER SUT TROCAR 14GX15 (MISCELLANEOUS) ×1 IMPLANT
KIT IMAGING PINPOINTPAQ (MISCELLANEOUS) IMPLANT
KIT PINK PAD W/HEAD ARE REST (MISCELLANEOUS) ×1
KIT PINK PAD W/HEAD ARM REST (MISCELLANEOUS) ×1 IMPLANT
LABEL OR SOLS (LABEL) ×1 IMPLANT
LIGASURE VESSEL 5MM BLUNT TIP (ELECTROSURGICAL) IMPLANT
MANIFOLD NEPTUNE II (INSTRUMENTS) ×1 IMPLANT
MANIPULATOR VCARE STD CRV RETR (MISCELLANEOUS) IMPLANT
NDL INSUFF ACCESS 14 VERSASTEP (NEEDLE) ×1 IMPLANT
NDL SPNL 22GX3.5 QUINCKE BK (NEEDLE) IMPLANT
NEEDLE SPNL 22GX3.5 QUINCKE BK (NEEDLE) ×1 IMPLANT
NS IRRIG 500ML POUR BTL (IV SOLUTION) ×1 IMPLANT
OCCLUDER COLPOPNEUMO (BALLOONS) ×1 IMPLANT
PACK GYN LAPAROSCOPIC (MISCELLANEOUS) ×1 IMPLANT
PAD OB MATERNITY 4.3X12.25 (PERSONAL CARE ITEMS) ×1 IMPLANT
SET TUBE SMOKE EVAC HIGH FLOW (TUBING) ×1 IMPLANT
SHEARS ENDO 5MM 31CM (CUTTER) IMPLANT
SUT ENDO VLOC 180-0-8IN (SUTURE) IMPLANT
SUT MNCRL 4-0 (SUTURE) ×1
SUT MNCRL 4-0 27XMFL (SUTURE) ×1
SUT VIC AB 0 CT1 36 (SUTURE) ×1 IMPLANT
SUT VIC AB 0 CT2 27 (SUTURE) IMPLANT
SUT VICRYL 0 UR6 27IN ABS (SUTURE) ×1 IMPLANT
SUTURE MNCRL 4-0 27XMF (SUTURE) ×1 IMPLANT
SYR 10ML LL (SYRINGE) ×1 IMPLANT
SYR 50ML LL SCALE MARK (SYRINGE) ×1 IMPLANT
TROCAR BLUNT TIP 12MM OMST12BT (TROCAR) ×1 IMPLANT
TROCAR VERSASTEP PLUS 12MM (TROCAR) ×1 IMPLANT
WATER STERILE IRR 500ML POUR (IV SOLUTION) ×1 IMPLANT

## 2023-07-27 NOTE — Discharge Instructions (Signed)
Discharge instructions after   total laparoscopic hysterectomy   For the next three days, take ibuprofen and acetaminophen on a schedule, every 8 hours, if these are medications you can take - your primary care provider might have limited either of them for other reasons in the past. You can take them together or you can intersperse them, and take one every four hours. You also have a narcotic, oxycodone, to take as needed if the above medicines don't help. Please note that narcotics can make you feel woozy, so please have someone with you for standing and walking the first time you take it.  Restart your anticoagulation 48 hours after surgery, so in the afternoon or evening of Friday.  Postop constipation is a major cause of pain. Stay well hydrated, walk as you tolerate, and take over the counter senna as well as stool softeners if you need them. I am including instructions from our GI friends below regarding postop constipation.    Signs and Symptoms to Report Call our office at [phone] if you have any of the following.   Fever over 100.4 degrees or higher  Severe stomach pain not relieved with pain medications  Bright red bleeding that's heavier than a period that does not slow with rest  To go the bathroom a lot (frequency), you can't hold your urine (urgency), or it hurts when you empty your bladder (urinate)  Chest pain  Shortness of breath  Pain in the calves of your legs  Severe nausea and vomiting not relieved with anti-nausea medications  Signs of infection around your wounds, such as redness, hot to touch, swelling, green/yellow drainage (like pus), bad smelling discharge  Any concerns  What You Can Expect after Surgery  You may see some pink tinged, bloody fluid and bruising around the wound. This is normal.  You may notice shoulder and neck pain. This is caused by the gas used during surgery to expand your abdomen so your surgeon could get to the uterus easier.  You  may have a sore throat because of the tube in your mouth during general anesthesia. This will go away in 2 to 3 days.  You may have some stomach cramps.  You may notice spotting on your panties.  You may have pain around the incision sites.   Activities after Your Discharge Follow these guidelines to help speed your recovery at home:  Do the coughing and deep breathing as you did in the hospital for 2 weeks. Use the small blue breathing device, called the incentive spirometer for 2 weeks.  Don't drive if you are in pain or taking narcotic pain medicine. You may drive when you can safely slam on the brakes, turn the wheel forcefully, and rotate your torso comfortably. This is typically 1-2 weeks. Practice in a parking lot or side street prior to attempting to drive regularly.   Ask others to help with household chores for 4 weeks.  Do not lift anything heavier that 10 pounds for 4-6 weeks. This includes pets, children, and groceries.  Don't do strenuous activities, exercises, or sports like vacuuming, tennis, squash, etc. until your doctor says it is safe to do so. ---Maintain pelvic rest for 8 weeks. This means nothing in the vagina or rectum at all (no douching, tampons, intercourse) for 8 weeks.   Walk as you feel able. Rest often since it may take two or three weeks for your energy level to return to normal.   You may climb stairs  Avoid constipation:   -Eat fruits, vegetables, and whole grains. Eat small meals as your appetite will take time to return to normal.   -Drink 6 to 8 glasses of water each day unless your doctor has told you to limit your fluids.   -Use a laxative or stool softener as needed if constipation becomes a problem. You may take Miralax, metamucil, Citrucil, Colace, Senekot, FiberCon, etc. If this does not relieve the constipation, try two tablespoons of Milk Of Magnesia every 8 hours until your bowels move.   You may shower. Gently wash the wounds with a mild soap and  water. Pat dry.  Do not get in a hot tub, swimming pool, etc. for 6 weeks.  Do not use lotions, oils, powders on the wounds.  Do not douche, use tampons, or have sex until your doctor says it is okay.  Take your pain medicine when you need it. The medicine may not work as well if the pain is bad.  Take the medicines you were taking before surgery.   Here is a helpful article from the website http://mitchell.org/, regarding constipation  Here are reasons why constipation occurs after surgery: 1) During the operation and in the recovery room, most people are given opioid pain medication, primarily through an IV, to treat moderate or severe pain. Intravenous opioids include morphine, Dilaudid and fentanyl. After surgery, patients are often prescribed opioid pain medication to take by mouth at home, including codeine, Vicodin, Norco, and Percocet. All of these medications cause constipation by slowing down the movement of your intestine. 2) Changes in your diet before surgery can be another culprit. It is common to get specific instructions to change how you normally eat or drink before your surgery, like only having liquids the day before or not having anything to eat or drink after midnight the night before surgery. For this reason, temporary dehydration may occur. This, along with not eating or only having liquids, means that you are getting less fiber than usual. Both these factors contribute to constipation. 3) Changes in your diet after surgery can also contribute to the problem. Although many people don't have dietary restrictions after operations, being under anesthesia can make you lose your appetite for several hours and maybe even days. Some people can even have nausea or vomiting. Not eating or drinking normally means that you are not getting enough fiber and you can get dehydrated, both leading to constipation. 4) Lying in a bed more than usual--which happens before, during and after  surgery--combined with the medications and diet changes, all work together to slow down your colon and make your poop turn to rock.  No one likes to be constipated.  Let's face it, it's not a pleasant feeling when you don't poop for days, then strain on the toilet to finally pass something large enough to cause damage. An ounce of prevention is worth a pound of cure, so: Assume you will be constipated. Plan and prepare accordingly. Post-surgery is one of those unique situations where the temporary use of laxatives can make a world of difference. Always consult with your doctor, and recognize that if you wait several days after surgery to take a laxative, the constipation might be too severe for these over-the-counter options. It is always important to discuss all medications you plan on taking with your doctor. Ask your doctor if you can start the laxative immediately after surgery. *  Here are go-to post-surgery laxatives: Senna: Senna is an herb that acts as a "  stimulant laxative," meaning it increases the activity of the intestine to cause you to have a bowel movement. It comes in many forms, but senna pills are easy to take and are sold over the counter at almost all pharmacies. Since opioid pain medications slow down the activity of the intestine, it makes sense to take a medication to help reverse that side effect. Long-term use of a stimulant laxative is not a good idea since it can make your colon "lazy" and not function properly; however, temporary use immediately after surgery is acceptable. In general, if you are able to eat a normal diet, taking senna soon after surgery works the best. Senna usually works within hours to produce a bowel movement, but this is less predictable when you are taking different medications after surgery. Try not to wait several days to start taking senna, as often it is too late by then. Just like with all medications or supplements, check with your doctor before  starting new treatment.   Magnesium: Magnesium is an important mineral that our body needs. We get magnesium from some foods that we eat, especially foods that are high in fiber such as broccoli, almonds and whole grains. There are also magnesium-based medications used to treat constipation including milk of magnesia (magnesium hydroxide), magnesium citrate and magnesium oxide. They work by drawing water into the intestine, putting it into the class of "osmotic" laxatives. Magnesium products in low doses appear to be safe, but if taken in very large doses, can lead to problems such as irregular heartbeat, low blood pressure and even death. It can also affect other medications you might be taking, therefore it is important to discuss using magnesium with your physician and pharmacist before initiating therapy. Most over-the-counter magnesium laxatives work very well to help with the constipation related to surgery, but sometimes they work too well and lead to diarrhea. Make sure you are somewhere with easy access to a bathroom, just in case.   Bisacodyl: Bisacodyl (generic name) is sold under brand names such as Dulcolax. Much like senna, it is a "stimulant laxative," meaning it makes your intestines move more quickly to push out the stool. This is another good choice to start taking as soon as your doctor says you can take a laxative after surgery. It comes in pill form and as a suppository, which is a good choice for people who cannot or are not allowed to swallow pills. Studies have shown that it works as a laxative, but like most of these medications, you should use this on a short-term basis only.   Enema: Enemas strike fear in many people, but FEAR NOT! It's nowhere near as big a deal as you may think. An enema is just a way to get some liquid into your rectum by placing a specially designed device through your anus. If you have never done one, it might seem like a painful, unpleasant, uncomfortable,  complicated and lengthy procedure. But in reality, it's simple, takes just a few seconds and is highly effective. The small ready-made bottles you buy at the pharmacy are much easier than the hose/large rubber container type. Those recommended positions illustrated in some instructions are generally not necessary to place the enema. It's very similar to the insertion of a tampon, requiring a slight squat. Some extra lubrication on the enema's tip (or on your anus) will make it a breeze. In certain cases, there is no substitute for a good enema. For example, if someone has not pooped for  a few days, the beginning of the poop waiting to come out can become rock hard. Passing that hard stool can lead to much pain and problems like anal fissures. Inserting a little liquid to break up the rock-hard stool will help make its passage much easier. Enemas come with different liquids. Most come with saline, but there are also mineral oil options. You can also use warm water in the reusable enema containers. They all work. But since saline can sometimes be irritating, so try a mineral oil or water enema instead.  Here are commonly recommended constipation medications that do not work well for post-surgery constipation: Docusate: Docusate (generic name) most commonly referred to as Colace (brand name) is not really a laxative, but is classified as a stool softener. Although this medication is commonly prescribed, it is not recommended for several reasons: 1) there is no good medical evidence that it works 2) even if it has an effect, which is very questionable, it is minimal and cannot combat the intestinal slowing caused by the opioid medications. Skip docusate to save money and space in your pillbox for something more effective.  PEG: Miralax (brand name) is basically a chemical called polyethylene glycol (PEG) and it has gained tremendous popularity as a laxative. This product is an "osmotic laxative" meaning it works  by pulling water into the stool, making it softer. This is very similar to the action of natural fiber in foods and supplements. Therefore, the effect seen by this medication is not immediate, causing a bowel movement in a day or more. Is this medication strong enough to battle the constipation related to having an operation? Maybe for some people not prone to constipation. But for most people, other laxatives are better to prevent constipation after surgery.

## 2023-07-27 NOTE — Interval H&P Note (Addendum)
History and Physical Interval Note:  07/27/2023 7:06 AM  Barbara Mooney  has presented today for surgery, with the diagnosis of Endometrial cancer.  The various methods of treatment have been discussed with the patient and family. After consideration of risks, benefits and other options for treatment, the patient has consented to  Procedure(s): TOTAL LAPAROSCOPIC HYSTERECTOMY WITH BILATERAL SALPINGO OOPHORECTOMY, SENTINEL LYMPH NODE INJECTION AND MAPPING, POSSIBLE NODE DISSECTION, POSSIBLE LAPAROTOMY (N/A) as a surgical intervention.  The patient's history has been reviewed, patient examined, no change in status, stable for surgery.  I have reviewed the patient's chart and labs.  Questions were answered to the patient's satisfaction.    Patient held Eliquis last two days as instructed.     [name]

## 2023-07-27 NOTE — Anesthesia Procedure Notes (Signed)
Procedure Name: Intubation Date/Time: 07/27/2023 7:55 AM  Performed by: Merlene Pulling, CRNAPre-anesthesia Checklist: Patient identified, Patient being monitored, Timeout performed, Emergency Drugs available and Suction available Patient Re-evaluated:Patient Re-evaluated prior to induction Oxygen Delivery Method: Circle system utilized Preoxygenation: Pre-oxygenation with 100% oxygen Induction Type: IV induction Ventilation: Mask ventilation without difficulty Laryngoscope Size: 3 and McGraph Grade View: Grade I Tube type: Oral Tube size: 6.5 mm Number of attempts: 1 Airway Equipment and Method: Stylet Placement Confirmation: ETT inserted through vocal cords under direct vision, positive ETCO2 and breath sounds checked- equal and bilateral Secured at: 21 cm Tube secured with: Tape Dental Injury: Teeth and Oropharynx as per pre-operative assessment

## 2023-07-27 NOTE — Progress Notes (Signed)
Gynecologic Oncology Consult Visit   Referring Provider: Dr. Dalbert Garnet  Chief Concern: Endometrial  Subjective:  Barbara Mooney is a 89 y.o. female who is seen in consultation from Dr. Dalbert Garnet for newly diagnosed endometrial cancer.   Patient experienced PMB which prompted ultrasound.   - Uterus: 8.39 x 4.41 x 6.02 cm  - Ovaries: bilaterally wnl  - Other: 27 mm endometrial lining, 2.8 cm posterior fibroid. The uterus had a maximal endometrial thickness of 27 mm. The endometrial contour appeared irregular and generally thickened. Endometrial findings were unable to be fully examined due to the large, solid mass .   05/25/23- Pap NILM  She underwent endometrial biopsy.  WELL DIFFERENTIATED ENDOMETRIOID CARCINOMA (FIGO I).  - Loss of MLH1 and PMS2. MSH2 and MSH6 intact.    She had possible rectal prolapse on exam and was referred to GI. She takes Eliquis for history of a fib.   She has a history of Breast DCIS and Non Hodgkins Lymphoma.   # Right lower inner biopsy- high grade DCIS, lobular neoplasia, calcifications. She underwent lumpectomy with high grade dcis, PLCIS, lobular neoplasia. Followed by radiation x 3 weeks with boost.    # Right LE Non-hodgkin follicular lymphoma s/p surgery at [facility] and RT with Dr Rushie Chestnut in 2018.  She has had radiation to the right lower extremity.  Genetic Testing: 08/26/21- [facility] Multi-cancer panel was negative.   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    Bradycardia 02/01/2019   Closed Colles' fracture 09/23/2018   Follicular non-Hodgkin's lymphoma of bone (HCC) 11/16/2017   Tibial mass 10/13/2017   Malignant neoplasm of right tibia (HCC) 10/07/2017   Type 2 diabetes mellitus with complication, with long-term current use of insulin (HCC) 08/07/2017   Complete tear of right rotator cuff 05/16/2017   Rotator cuff tendinitis, right 05/16/2017   Encounter for anticoagulation discussion and counseling 02/08/2017   Leg swelling 02/08/2017   Non-Hodgkin lymphoma (HCC) 2018   Unsteady gait    Anemia    Paroxysmal atrial fibrillation (HCC)    Essential hypertension    Sepsis (HCC) 10/28/2016   Degenerative tear of lateral meniscus, left 08/09/2016   Primary osteoarthritis of left knee 08/09/2016   Varicose veins of both legs with edema 03/17/2016   Type 2 diabetes mellitus with stage 3 chronic kidney disease, without long-term current use of insulin (HCC) 01/27/2016   Mixed incontinence 06/11/2015   External hemorrhoid 03/11/2015   Gastroesophageal reflux disease without esophagitis 03/11/2015   Onychomycosis of toenail 03/11/2015   Knee strain, right, subsequent encounter 02/28/2015   Primary osteoarthritis of right knee 02/28/2015   Vitamin B 12 deficiency 06/13/2014   Multinodular goiter (nontoxic) 09/07/2011    Past Medical History: Past Medical History:  Diagnosis Date   Anemia    Aortic atherosclerosis (HCC)    Arthritis    Bradycardia    Bruises easily    Chronic kidney disease (CKD), stage III (moderate) (HCC)    DM (diabetes mellitus), type 2 (HCC)    Ductal carcinoma  in situ (DCIS) of right breast 03/09/2021   a.) stage 0 (high grade cTis (DCIS), cN0, cM0) with comedonecrosis -- > s/p lumpectomy + XRT   Endometrial cancer (HCC) 05/25/2023   a.) pathology (+) for well differentiated endometroid carcinoma (FIGO 1)   Family history of breast cancer    Family history of colon cancer    Family history of Hodgkin's disease     Family history of ovarian cancer    Follicular non-Hodgkin's lymphoma (HCC) 2018   a.) 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 RVSP 37.87mmHg. Mod dil LA. Mild to mod MR/TR. Mild AoV sclerosis; c.) TTE 07/08/2023: EF 60-65%, mild RVE, sev BAE, mod MAC, mild-mod TR, mild MR, RVSP 30.5   History of hiatal hernia    HLD (hyperlipidemia)    HOH (hard of hearing)    Hypertension    Hypokalemia    Hypothyroidism    Leg pain    a. 08/2020 ABIs: R 1.19, L 0.97.   Mixed incontinence    MRSA infection    a.) remote history; posterior torso   Multiple thyroid nodules    On apixaban therapy    Peripheral edema    Permanent atrial fibrillation (HCC) 10/2016   a.) CHA2DS2VASc = 6 (age x2, sex, HTN, vascular disease history, T2DM);  b.) s/p DCCV 01/20/2017 (150 J x 1) --> failed; c.) rate/rhythm maintained on oral diltiazem; chronically anticoagulated with apixaban   RBBB (right bundle branch block)    Recurrent UTI (urinary tract infection)    Sepsis (HCC)    Unsteady gait    Varicose veins of both lower extremities    Vitamin B12 deficiency     Past Surgical History: Past Surgical History:  Procedure Laterality Date   BONE BIOPSY Right 2018   tibia   BREAST BIOPSY Right 02/26/2021   ffirm bx-"X" clip-HIGH-GRADE DUCTAL CARCINOMA IN SITU   BREAST LUMPECTOMY Right 03/09/2021   Procedure: BREAST LUMPECTOMY;  Surgeon: Earline Mayotte, MD;  Location: [facility] ORS;  Service: General;  Laterality: Right;   CARDIOVERSION N/A 01/20/2017   Procedure: CARDIOVERSION;  Surgeon: Antonieta Iba, MD;  Location: [facility] ORS;  Service: Cardiovascular;  Laterality: N/A;   CATARACT EXTRACTION  W/PHACO Right 02/15/2018   Procedure: CATARACT EXTRACTION PHACO AND INTRAOCULAR LENS PLACEMENT (IOC);  Surgeon: Galen Manila, MD;  Location: [facility] ORS;  Service: Ophthalmology;  Laterality: Right;  [place] 01:22.8 AP% 18.9 CDE 15.62 Fluid Pack Lot # [id number]   CATARACT EXTRACTION W/PHACO Left 03/08/2018   Procedure: CATARACT EXTRACTION PHACO AND INTRAOCULAR LENS PLACEMENT (IOC);  Surgeon: Galen Manila, MD;  Location: [facility] ORS;  Service: Ophthalmology;  Laterality: Left;  [place] 01:05 AP% 15.8 CDE 10.28 Fluid pak lot # [id number]   COLONOSCOPY      Past Gynecologic History:  Per HPI  OB History:  OB History  Gravida Para Term Preterm AB Living  2         2  SAB IAB Ectopic Multiple Live Births               # Outcome Date GA Lbr Len/2nd Weight Sex Type Anes PTL Lv  2 [place]  1 Gravida             Family History: Family History  Problem Relation Age of Onset   Breast cancer Sister 49   Stroke Sister    Atrial fibrillation Sister    Heart disease Brother    Ovarian cancer Maternal Aunt    Colon cancer Maternal Aunt    Kidney cancer Neg Hx    Bladder Cancer Neg Hx     Social History: Social History   Socioeconomic History   Marital status: Widowed    Spouse name: Not on file   Number of children: Not on file   Years of education: Not on file   Highest education level: Not on file  Occupational History   Not on file  Tobacco Use   Smoking status: Never   Smokeless tobacco: Never  Vaping Use   Vaping status: Never Used  Substance and Sexual Activity   Alcohol use: No   Drug use: No   Sexual activity: Not Currently  Other Topics Concern   Not on file  Social History Narrative   Lives by self; son lives about 2 miles. Never smoked; no alcohol. Used to [employer]: Low Risk  (07/20/2023)   Received [facility]   Overall Financial Resource Strain (CARDIA)    Difficulty of  Paying Living Expenses: Not hard at all  Food Insecurity: No Food Insecurity (07/20/2023)   [facility]   Hunger Vital Sign    Worried About Running Out of Food in the Last Year: Never true    Ran Out of Food in the Last Year: Never true  Transportation Needs: No Transportation Needs (07/20/2023)   Received from [facility] - Transportation    In the past 12 months, has lack of transportation kept you from medical appointments or from getting medications?: No    Lack of Transportation (Non-Medical): No  Physical Activity: Not on file  Stress: Not on file  Social Connections: Not on file  Intimate Partner Violence: Not on file    Allergies: Allergies  Allergen Reactions   Nitrofurantoin Diarrhea, Nausea Only and Other (See Comments)   Sulfa Antibiotics Nausea Only and Other (See Comments)    Current Medications: No current facility-administered medications for this visit.   No current outpatient medications on file.   Facility-Administered Medications Ordered in Other Visits  Medication Dose Route Frequency Provider Last Rate Last Admin   0.9 %  sodium chloride infusion   Intravenous Continuous Louie Boston, MD 50 mL/hr at 07/27/23 0652 New Bag at 07/27/23 4098   ceFAZolin (ANCEF) IVPB 2g/100 mL premix  2 g Intravenous On Call to OR Artelia Laroche, MD       Review of Systems  General: negative for fevers, changes in weight or night sweats Skin: negative for changes in moles or sores or rash Eyes: negative for changes in vision HEENT: negative for change in hearing, tinnitus, voice changes Pulmonary: negative for dyspnea, orthopnea, productive cough, wheezing Cardiac: negative for palpitations, pain Gastrointestinal: negative for nausea, vomiting, constipation, diarrhea, hematemesis, hematochezia Genitourinary/Sexual: negative for dysuria, retention, hematuria, incontinence Ob/Gyn: abnormal  bleeding Musculoskeletal: pain in the legs and weakness Hematology: negative for easy bruising, abnormal bleeding Neurologic/Psych: negative for headaches, seizures, paralysis, numbness  Objective:  Physical Examination:  BP (!) 143/77   Pulse 83   Temp 97.6 F (36.4 C)  Resp 17   Wt 167 lb 3.2 oz (75.8 kg)   BMI 26.99 kg/m    ECOG Performance Status: 1 - Symptomatic but completely ambulatory  GENERAL: Patient is an elderly appearing in no acute distress HEENT:  Sclera clear. Anicteric NODES:  Negative axillary, supraclavicular, inguinal lymph node survery LUNGS:  Clear to auscultation bilaterally.   HEART:  Regular rate and rhythm.  ABDOMEN:  Soft, nontender.  No hernias, incisions well healed. No masses or ascites EXTREMITIES:  No peripheral edema. Atraumatic. No cyanosis SKIN:  Clear with no obvious rashes or skin changes.  NEURO:  Nonfocal. Well oriented.  Appropriate affect.  Pelvic:Exam chaperoned CMA. Vulva: normal appearing vulva with no masses, tenderness or lesions Vagina: normal vagina Adnexa: normal adnexa in size, nontender and no masses Uterus: uterus is normal size, shape, consistency and nontender Cervix: no cervical motion tenderness and no lesions Rectal: not indicated    Lab Review 01/28/2023 Thyroid Stimulating Hormone (TSH) 0.450-5.330 uIU/ml uIU/mL 2.040   HbA1c, CBC, CMP, TSH ordered  Radiologic Imaging: As per HPI    Assessment:  Barbara Mooney is a 89 y.o. 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  Type 2 diabetes mellitus  Hypothyroidism  A-fib on chronic long-term anticoagulation  Personal and family history of malignancy.  Prior genetic testing negative.  Medical co-morbidities complicating care: diabetes. Non-Hodgkin lymphoma and breast cancer (DCIS) status post radiation therapy Plan:   Problem List Items Addressed This Visit        Endocrine   Type 2 diabetes mellitus with complication, with long-term current use of insulin (HCC)   Relevant Orders   Hemoglobin A1c   Other Visit Diagnoses     Endometrial cancer determined by uterine biopsy (HCC)    -  Primary   Relevant Orders   CBC with Differential/Platelet (Completed)   Comprehensive metabolic panel (Completed)   TSH (Completed)   DG Chest 2 View   Hemoglobin A1c   Counseling and coordination of care           We discussed options for management inclu surgery, radiation or hormonal therapy.  Her performance status is a 1-2 and she does appear to be frail, but overall great health given her age. We reviewed the higher curative rates with surgery, we recommend surgery as long as she is cleared by cardiology and anesthesia with laparoscopic total hysterectomy, bilateral salpingo-oophorectomy, sentinel lymph node injection with ICG, mapping, biopsy of sentinel lymph nodes, possible node dissection, possible laparotomy on 07/13/2023 with Dr. Johnnette Litter.  We will need to confirm that Dr. Dalbert Garnet is available to assist with surgery.   Risks of surgery were discussed in detail. These include infection, anesthesia, bleeding, transfusion, wound separation, hernia, vaginal cuff dehiscence, medical issues (blood clots, stroke, heart attack, fluid in the lungs, pneumonia, abnormal heart rhythm, death), possible exploratory surgery with larger incision, lymphedema, lymphocyst, allergic reaction, injury to adjacent organs (bowel, bladder, blood vessels, nerves, ureters).  She may be at higher risk for blood clots given her personal history and stressed the importance of early and adequate ambulation.  She will receive DVT/VTE prophylaxis prophylaxis as well as restarting her Eliquis the day after surgery.  We recommend that she stop her Eliquis 3 days before surgery and this will be messaged to her by Consuello Masse.  We just want to confirm this plan with cardiology and also check  on her creatinine.   GYN VTE Prophylaxis  Risk point calculation: +3  age >74 +1  BMI 25-30 TOTAL: 4   VTE Risk assessment:  This patient has cancer, elevated tumor markers or personal/family hx VTE, and does not have disseminated cancer, or planned open or radical surgery.  The calculated risk score is 0-4.    High Risk:   One pre-op dose UFH, then continuous post-op dosing with LMWH or UFH while inpatient AND SCDs.  We ordered TSH CBC CMP and hemoglobin A1c today.  Chest x-ray obtained to assess for pulmonary mets.  We reached out to cardiology regarding their request for EKG and ECHO or other testing.  We discussed that her pathology will be reviewed after surgery and we can make a determination if any further treatment may be needed with radiation or chemotherapy.     Suggested return to clinic in  4 -6 weeks after surgery.    The patient's diagnosis, an outline of the further diagnostic and laboratory studies which will be required, the recommendation, and alternatives were discussed.  All questions were answered to the patient's satisfaction.  A total of 90 minutes were spent with the patient/family today; >50% was spent in education, counseling and coordination of care for endometrial cancer.    [name], MD    CC:   Dr. Dalbert Garnet

## 2023-07-27 NOTE — Progress Notes (Signed)
Patient awaiting bed on surgical unit. Patient ambulated 9ft to bedside commode with assist of one. Patient tolerated movement well. VS remained stable. No drainage noted on peripad. New peripad placed.

## 2023-07-27 NOTE — H&P (Signed)
Gynecologic Oncology Consult Visit    Referring Provider: Dr. Dalbert Garnet   Chief Concern: Endometrial   Subjective:  Barbara Mooney is a 89 y.o. female who is seen in consultation from Dr. Dalbert Garnet for newly diagnosed endometrial cancer.    Patient experienced PMB which prompted ultrasound.    - Uterus: 8.39 x 4.41 x 6.02 cm  - Ovaries: bilaterally wnl  - Other: 27 mm endometrial lining, 2.8 cm posterior fibroid. The uterus had a maximal endometrial thickness of 27 mm. The endometrial contour appeared irregular and generally thickened. Endometrial findings were unable to be fully examined due to the large, solid mass .    05/25/23- Pap NILM   She underwent endometrial biopsy.  WELL DIFFERENTIATED ENDOMETRIOID CARCINOMA (FIGO I).  - Loss of MLH1 and PMS2. MSH2 and MSH6 intact.     She had possible rectal prolapse on exam and was referred to GI. She takes Eliquis for history of a fib.    She has a history of Breast DCIS and Non Hodgkins Lymphoma.    # Right lower inner biopsy- high grade DCIS, lobular neoplasia, calcifications. She underwent lumpectomy with high grade dcis, PLCIS, lobular neoplasia. Followed by radiation x 3 weeks with boost.    # Right LE Non-hodgkin follicular lymphoma s/p surgery at [facility] and RT with Dr Rushie Chestnut in 2018.  She has had radiation to the right lower extremity.   Genetic Testing: 08/26/21- [facility] Multi-cancer panel was negative.    Last colonoscopy in 05/2014- results not available Mammogram 02/25/23- Bi-Rads category 2: benign   Problem List:     Patient Active Problem List    Diagnosis Date Noted   Genetic testing 08/26/2021   Family history of breast cancer 07/21/2021   Family history of ovarian cancer 07/21/2021   Family history of colon cancer 07/21/2021   Family history of Hodgkin's disease 07/21/2021   Hyperlipemia, mixed 07/21/2021   Osteopenia 07/21/2021   Acquired trigger finger 06/11/2021   Ductal carcinoma in situ (DCIS) of right  breast 03/05/2021   Bradycardia 02/01/2019   Closed Colles' fracture 09/23/2018   Follicular non-Hodgkin's lymphoma of bone (HCC) 11/16/2017   Tibial mass 10/13/2017   Malignant neoplasm of right tibia (HCC) 10/07/2017   Type 2 diabetes mellitus with complication, with long-term current use of insulin (HCC) 08/07/2017   Complete tear of right rotator cuff 05/16/2017   Rotator cuff tendinitis, right 05/16/2017   Encounter for anticoagulation discussion and counseling 02/08/2017   Leg swelling 02/08/2017   Non-Hodgkin lymphoma (HCC) 2018   Unsteady gait     Anemia     Paroxysmal atrial fibrillation (HCC)     Essential hypertension     Sepsis (HCC) 10/28/2016   Degenerative tear of lateral meniscus, left 08/09/2016   Primary osteoarthritis of left knee 08/09/2016   Varicose veins of both legs with edema 03/17/2016   Type 2 diabetes mellitus with stage 3 chronic kidney disease, without long-term current use of insulin (HCC) 01/27/2016   Mixed incontinence 06/11/2015   External hemorrhoid 03/11/2015   Gastroesophageal reflux disease without esophagitis 03/11/2015   Onychomycosis of toenail 03/11/2015   Knee strain, right, subsequent encounter 02/28/2015   Primary osteoarthritis of right knee 02/28/2015   Vitamin B 12 deficiency 06/13/2014   Multinodular goiter (nontoxic) 09/07/2011      Past Medical History:     Past Medical History:  Diagnosis Date   Arthritis     Atrial fibrillation (HCC)     Breast cancer (HCC) 03/09/2021  Right Breast   Bruises easily     Current use of long term anticoagulation      Apixaban   Diabetes mellitus     Edema      FEET/ LEGS   Family history of breast cancer     Family history of colon cancer     Family history of Hodgkin's disease     Family history of ovarian cancer     GERD (gastroesophageal reflux disease)     History of echocardiogram      a. 08/2020 Echo: EF 60-65%, no rwma. RVSP 37.80mmHg. Mod dil LA. Mild to mod MR/TR. Mild AoV  sclerosis.   HLD (hyperlipidemia)     Hypertension     Hypothyroidism     Leg pain      a. 08/2020 ABIs: R 1.19, L 0.97.   Lymphoma (HCC) 12/2017    RADIATION RIGHT LEG    MRSA infection      remote history; posterior torso   Multiple thyroid nodules     Non-Hodgkin lymphoma (HCC) 2018   Permanent atrial fibrillation (HCC)      a. Dx 10/2016; b. 01/2017 s/p DCCV-->failed-->rate controlled; c. CHA2DS2VASc = 5-->eliquis.   Personal history of radiation therapy 12/2017    lymphoma   Personal history of radiation therapy 2022    Breast Cancer      Past Surgical History:      Past Surgical History:  Procedure Laterality Date   BONE BIOPSY        2018   BREAST BIOPSY Right 02/26/2021    ffirm bx-"X" clip-HIGH-GRADE DUCTAL CARCINOMA IN SITU   BREAST LUMPECTOMY Right 03/09/2021    Procedure: BREAST LUMPECTOMY;  Surgeon: Earline Mayotte, MD;  Location: [facility] ORS;  Service: General;  Laterality: Right;   CARDIOVERSION N/A 01/20/2017    Procedure: CARDIOVERSION;  Surgeon: Antonieta Iba, MD;  Location: [facility] OR[facility];  Service: Cardiovascular;  Laterality: N/A;   CARDIOVERSION        AFIB 2018   CATARACT EXTRACTION W/PHACO Right 02/15/2018    Procedure: CATARACT EXTRACTION PHACO AND INTRAOCULAR LENS PLACEMENT (IOC);  Surgeon: Galen Manila, MD;  Location: [facility] OR[facility];  Service: Ophthalmology;  Laterality: Right;  [place] 01:22.8 AP% 18.9 CDE 15.62 Fluid Pack Lot # [id number]   CATARACT EXTRACTION W/PHACO Left 03/08/2018    Procedure: CATARACT EXTRACTION PHACO AND INTRAOCULAR LENS PLACEMENT (IOC);  Surgeon: Galen Manila, MD;  Location: [facility];  Service: Ophthalmology;  Laterality: Left;  [place] 01:05 AP% 15.8 CDE 10.28 Fluid pak lot # [id number]   COLONOSCOPY       EYE SURGERY          Past Gynecologic History:  Per HPI   OB History:                  OB History  Gravida Para Term Preterm AB Living  2         2  SAB IAB Ectopic Multiple Live Births                      # Outcome  Date GA Lbr Len/2nd Weight Sex Delivery Anes PTL Lv  2 Gravida                    1 Gravida                        Family History:      Family History  Problem Relation Age of Onset   Breast cancer Sister 61   Heart disease Brother     Ovarian cancer Maternal Aunt          another aunt- colon cancer   Stroke Sister     Arrhythmia Sister          A-Fib   Kidney cancer Neg Hx     Bladder Cancer Neg Hx        Social History: Social History         Socioeconomic History   Marital status: Widowed      Spouse name: Not on file   Number of children: Not on file   Years of education: Not on file   Highest education level: Not on file  Occupational History   Not on file  Tobacco Use   Smoking status: Never   Smokeless tobacco: Never  Vaping Use   Vaping Use: Never used  Substance and Sexual Activity   Alcohol use: No   Drug use: No   Sexual activity: Not Currently  Other Topics Concern   Not on file  Social History Narrative    Lives by self; son lives about 2 miles. Never smoked; no alcohol. Used to [employer] Strain: Not on [employer] Insecurity: Not on file  Transportation Needs: Not on file  Physical Activity: Not on file  Stress: Not on file  Social Connections: Not on file  Intimate Partner Violence: Not on file      Allergies:     Allergies  Allergen Reactions   Nitrofurantoin Diarrhea, Nausea Only and Other (See Comments)   Sulfa Antibiotics Nausea Only and Other (See Comments)      Current Medications:       Current Outpatient Medications  Medication Sig Dispense Refill   acetaminophen (TYLENOL) 500 MG tablet Take 500 mg by mouth at bedtime.       CRANBERRY PO Take 1 capsule by mouth 2 (two) times daily.        cyanocobalamin (,VITAMIN B-12,) 1000 MCG/ML injection Inject 1,000 mcg into the muscle every 30 (thirty) days.        diltiazem (CARDIZEM CD) 180 MG 24 hr capsule Take 1 capsule (180 mg  total) by mouth 2 (two) times daily. 180 capsule 3   ELIQUIS 5 MG TABS tablet TAKE 1 TABLET BY MOUTH TWICE A DAY 180 tablet 1   furosemide (LASIX) 20 MG tablet Take 1 tablet (20 mg total) by mouth daily as needed for fluid or edema (SOB). 90 tablet 3   LACTOBACILLUS PO Take by mouth.       metFORMIN (GLUCOPHAGE) 500 MG tablet Take 500 mg by mouth 2 (two) times daily with a meal.       olmesartan (BENICAR) 40 MG tablet Take 1 tablet (40 mg total) by mouth daily. 90 tablet 3   omeprazole (PRILOSEC) 20 MG capsule Take 1 capsule (20 mg total) by mouth daily. 30 capsule 6   ondansetron (ZOFRAN) 4 MG tablet Take 4 mg by mouth every 8 (eight) hours as needed.       Trolamine Salicylate (BLUE-EMU HEMP EX) Apply 1 application topically daily as needed (Knee pain).        No current facility-administered medications for this visit.    Review of Systems  General: negative for fevers, changes in weight or night sweats Skin: negative for changes in moles or sores or rash  Eyes: negative for changes in vision HEENT: negative for change in hearing, tinnitus, voice changes Pulmonary: negative for dyspnea, orthopnea, productive cough, wheezing Cardiac: negative for palpitations, pain Gastrointestinal: negative for nausea, vomiting, constipation, diarrhea, hematemesis, hematochezia Genitourinary/Sexual: negative for dysuria, retention, hematuria, incontinence Ob/Gyn: abnormal bleeding Musculoskeletal: pain in the legs and weakness Hematology: negative for easy bruising, abnormal bleeding Neurologic/Psych: negative for headaches, seizures, paralysis, numbness   Objective:  Physical Examination:  BP (!) 143/77   Pulse 83   Temp 97.6 F (36.4 C)   Resp 17   Wt 167 lb 3.2 oz (75.8 kg)   BMI 26.99 kg/m    ECOG Performance Status: 1 - Symptomatic but completely ambulatory   GENERAL: Patient is an elderly appearing in no acute distress HEENT:  Sclera clear. Anicteric NODES:  Negative axillary,  supraclavicular, inguinal lymph node survery LUNGS:  Clear to auscultation bilaterally.   HEART:  Regular rate and rhythm.  ABDOMEN:  Soft, nontender.  No hernias, incisions well healed. No masses or ascites EXTREMITIES:  No peripheral edema. Atraumatic. No cyanosis SKIN:  Clear with no obvious rashes or skin changes.  NEURO:  Nonfocal. Well oriented.  Appropriate affect.   Pelvic:Exam chaperoned CMA. Vulva: normal appearing vulva with no masses, tenderness or lesions Vagina: normal vagina Adnexa: normal adnexa in size, nontender and no masses Uterus: uterus is normal size, shape, consistency and nontender Cervix: no cervical motion tenderness and no lesions Rectal: not indicated       Lab Review 01/28/2023 Thyroid Stimulating Hormone (TSH) 0.450-5.330 uIU/ml uIU/mL 2.040    HbA1c, CBC, CMP, TSH ordered   Radiologic Imaging: As per HPI    Assessment:  [place] B Galan is a 89 y.o. 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   Type 2 diabetes mellitus   Hypothyroidism   A-fib on chronic long-term anticoagulation   Personal and family history of malignancy.  Prior genetic testing negative.   Medical co-morbidities complicating care: diabetes. Non-Hodgkin lymphoma and breast cancer (DCIS) status post radiation therapy Plan:    Problem List Items Addressed This Visit   None Visit Diagnoses       Endometrial cancer determined by uterine biopsy (HCC)    -  Primary    Relevant Orders    CBC with Differential/Platelet    Comprehensive metabolic panel    TSH    Hemoglobin A1c    DG Chest 2 View    Counseling and coordination of care               We discussed options for management inclu surgery, radiation or hormonal therapy.  Her performance status is a 1-2 and she does appear to be frail, but overall great health given her age. We reviewed the higher curative rates with  surgery, we recommend surgery as long as she is cleared by cardiology and anesthesia with laparoscopic total hysterectomy, bilateral salpingo-oophorectomy, sentinel lymph node injection with ICG, mapping, biopsy of sentinel lymph nodes, possible node dissection, possible laparotomy on 07/13/2023 with Dr. Johnnette Litter.  We will need to confirm that Dr. Dalbert Garnet is available to assist with surgery.    Risks of surgery were discussed in detail. These include infection, anesthesia, bleeding, transfusion, wound separation, hernia, vaginal cuff dehiscence, medical issues (blood clots, stroke, heart attack, fluid in the lungs, pneumonia, abnormal heart rhythm, death), possible exploratory surgery with larger incision, lymphedema, lymphocyst, allergic reaction, injury to adjacent organs (bowel, bladder, blood vessels,  nerves, ureters).  She may be at higher risk for blood clots given her personal history and stressed the importance of early and adequate ambulation.  She will receive DVT/VTE prophylaxis prophylaxis as well as restarting her Eliquis the day after surgery.  We recommend that she stop her Eliquis 3 days before surgery and this will be messaged to her by Consuello Masse.  We just want to confirm this plan with cardiology and also check on her creatinine.    GYN VTE Prophylaxis Risk point calculation: +3  age >74 +1  BMI 25-30 TOTAL: 4    VTE Risk assessment:  This patient has cancer, elevated tumor markers or personal/family hx VTE, and does not have disseminated cancer, or planned open or radical surgery.  The calculated risk score is 0-4.     High Risk:   One pre-op dose UFH, then continuous post-op dosing with LMWH or UFH while inpatient AND SCDs.   We ordered TSH CBC CMP and hemoglobin A1c today.  Chest x-ray obtained to assess for pulmonary mets.  We reached out to cardiology regarding their request for EKG and ECHO or other testing.   We discussed that her pathology will be reviewed after  surgery and we can make a determination if any further treatment may be needed with radiation or chemotherapy.       Suggested return to clinic in  4 -6 weeks after surgery.     The patient's diagnosis, an outline of the further diagnostic and laboratory studies which will be required, the recommendation, and alternatives were discussed.  All questions were answered to the patient's satisfaction.   A total of 90 minutes were spent with the patient/family today; >50% was spent in education, counseling and coordination of care for endometrial cancer.     [name], MD       CC:

## 2023-07-27 NOTE — Interval H&P Note (Signed)
History and Physical Interval Note:  07/27/2023 7:35 AM  Barbara Mooney  has presented today for surgery, with the diagnosis of Endometrial cancer.  The various methods of treatment have been discussed with the patient and family. After consideration of risks, benefits and other options for treatment, the patient has consented to  Procedure(s): TOTAL LAPAROSCOPIC HYSTERECTOMY WITH BILATERAL SALPINGO OOPHORECTOMY, SENTINEL LYMPH NODE INJECTION AND MAPPING, POSSIBLE NODE DISSECTION, POSSIBLE LAPAROTOMY (N/A) as a surgical intervention.  The patient's history has been reviewed, patient examined, no change in status, stable for surgery.  I have reviewed the patient's chart and labs.  Questions were answered to the patient's satisfaction.     [name]

## 2023-07-27 NOTE — Transfer of Care (Signed)
Immediate Anesthesia Transfer of Care Note  Patient: Barbara Mooney  Procedure(s) Performed: TOTAL LAPAROSCOPIC HYSTERECTOMY WITH BILATERAL SALPINGO OOPHORECTOMY, SENTINEL LYMPH NODE INJECTION AND MAPPING, NODE DISSECTION (Abdomen)  Patient Location: PACU  Anesthesia Type:General  Level of Consciousness: drowsy  Airway & Oxygen Therapy: Patient Spontanous Breathing  Post-op Assessment: Report given to RN and Post -op Vital signs reviewed and stable  Post vital signs: Reviewed  Last Vitals:  Vitals Value Taken Time  BP 120/53 07/27/23 1031  Temp 97.47F   Pulse 70 07/27/23 1035  Resp 13 07/27/23 1035  SpO2 100 % 07/27/23 1035  Vitals shown include unfiled device data.  Last Pain:  Vitals:   07/27/23 0629  TempSrc: Temporal  PainSc: 0-No pain         Complications: No notable events documented.

## 2023-07-27 NOTE — Anesthesia Preprocedure Evaluation (Addendum)
Anesthesia Evaluation  Patient identified by MRN, date of birth, ID band Patient awake    Reviewed: Allergy & Precautions, NPO status , Patient's Chart, lab work & pertinent test results  History of Anesthesia Complications Negative for: history of anesthetic complications  Airway Mallampati: III  TM Distance: <3 FB Neck ROM: full    Dental  (+) Chipped   Pulmonary neg pulmonary ROS, neg shortness of breath   Pulmonary exam normal        Cardiovascular Exercise Tolerance: Good hypertension, (-) angina + dysrhythmias Atrial Fibrillation  Rhythm:irregular Rate:Normal     Neuro/Psych negative neurological ROS  negative psych ROS   GI/Hepatic Neg liver ROS, hiatal hernia,GERD  Controlled,,  Endo/Other  diabetes, Type 2Hypothyroidism    Renal/GU Renal disease     Musculoskeletal   Abdominal   Peds  Hematology negative hematology ROS (+)   Anesthesia Other Findings Past Medical History: No date: Anemia No date: Aortic atherosclerosis (HCC) No date: Arthritis No date: Bradycardia No date: Bruises easily No date: Chronic kidney disease (CKD), stage III (moderate) (HCC) No date: DM (diabetes mellitus), type 2 (HCC) 03/09/2021: Ductal carcinoma in situ (DCIS) of right breast     Comment:  a.) stage 0 (high grade cTis (DCIS), cN0, cM0) with               comedonecrosis -- > s/p lumpectomy + XRT 05/25/2023: Endometrial cancer (HCC)     Comment:  a.) pathology (+) for well differentiated endometroid               carcinoma (FIGO 1) No date: Family history of breast cancer No date: Family history of colon cancer No date: Family history of Hodgkin's disease No date: Family history of ovarian cancer 2018: Follicular non-Hodgkin's lymphoma (HCC)     Comment:  a.) stage I E follicular lymphoma consistent with B-cell              germinal center origin of the RIGHT proximal tibia s/p               XRT No date: GERD  (gastroesophageal reflux disease) 02/2018: History of bilateral cataract extraction No date: History of echocardiogram     Comment:  a.) TTE 10/31/2016: EF 55-60%, mild LVH, mild MAC, mod               LAE, triv AR, mild TR; b.)  TTE 08/21/2020: EF 60-65%, no              RWMAs. RVSP 37.76mmHg. Mod dil LA. Mild to mod MR/TR. Mild              AoV sclerosis; c.) TTE 07/08/2023: EF 60-65%, mild RVE,               sev BAE, mod MAC, mild-mod TR, mild MR, RVSP 30.5 No date: History of hiatal hernia No date: HLD (hyperlipidemia) No date: HOH (hard of hearing) No date: Hypertension No date: Hypokalemia No date: Hypothyroidism No date: Leg pain     Comment:  a. 08/2020 ABIs: R 1.19, L 0.97. No date: Mixed incontinence No date: MRSA infection     Comment:  a.) remote history; posterior torso No date: Multiple thyroid nodules No date: On apixaban therapy No date: Peripheral edema 10/2016: Permanent atrial fibrillation (HCC)     Comment:  a.) CHA2DS2VASc = 6 (age x2, sex, HTN, vascular disease               history, T2DM);  b.) s/p DCCV 01/20/2017 (150 J x 1) -->               failed; c.) rate/rhythm maintained on oral diltiazem;               chronically anticoagulated with apixaban No date: RBBB (right bundle branch block) No date: Recurrent UTI (urinary tract infection) No date: Sepsis (HCC) No date: Unsteady gait No date: Varicose veins of both lower extremities No date: Vitamin B12 deficiency  Past Surgical History: 2018: BONE BIOPSY; Right     Comment:  tibia 02/26/2021: BREAST BIOPSY; Right     Comment:  ffirm bx-"X" clip-HIGH-GRADE DUCTAL CARCINOMA IN SITU 03/09/2021: BREAST LUMPECTOMY; Right     Comment:  Procedure: BREAST LUMPECTOMY;  Surgeon: Earline Mayotte, MD;  Location: [facility];  Service: General;                Laterality: Right; 01/20/2017: CARDIOVERSION; N/A     Comment:  Procedure: CARDIOVERSION;  Surgeon: Antonieta Iba,               MD;   Location: [facility] OR[facility];  Service: Cardiovascular;                Laterality: N/A; 02/15/2018: CATARACT EXTRACTION W/PHACO; Right     Comment:  Procedure: CATARACT EXTRACTION PHACO AND INTRAOCULAR               LENS PLACEMENT (IOC);  Surgeon: Galen Manila, MD;                Location: [facility];  Service: Ophthalmology;  Laterality:              Right;  [place] 01:22.8 AP% 18.9 CDE 15.62 Fluid Pack Lot #              [id number] 03/08/2018: CATARACT EXTRACTION W/PHACO; Left     Comment:  Procedure: CATARACT EXTRACTION PHACO AND INTRAOCULAR               LENS PLACEMENT (IOC);  Surgeon: Galen Manila, MD;                Location: [facility];  Service: Ophthalmology;  Laterality:              Left;  [place] 01:05 AP% 15.8 CDE 10.28 Fluid pak lot #               [id number] No date: COLONOSCOPY     Reproductive/Obstetrics negative OB ROS                             Anesthesia Physical Anesthesia Plan  ASA: 3  Anesthesia Plan: General ETT   Post-op Pain Management:    Induction: Intravenous  PONV Risk Score and Plan: Ondansetron, Dexamethasone, Midazolam and Treatment may vary due to age or medical condition  Airway Management Planned: Oral ETT  Additional Equipment:   Intra-op Plan:   Post-operative Plan: Extubation in OR  Informed Consent: I have reviewed the patients History and Physical, chart, labs and discussed the procedure including the risks, benefits and alternatives for the proposed anesthesia with the patient or authorized representative who has indicated his/her understanding and acceptance.     Dental Advisory Given  Plan Discussed with: Anesthesiologist, CRNA and Surgeon  Anesthesia Plan Comments: (Patient consented for risks of anesthesia including but not limited to:  -  adverse reactions to medications - damage to eyes, teeth, lips or other oral mucosa - nerve damage due to positioning  - sore throat or hoarseness - Damage to heart,  brain, nerves, lungs, other parts of body or loss of life  Patient voiced understanding.)       Anesthesia Quick Evaluation

## 2023-07-27 NOTE — H&P (Signed)
Gynecologic Oncology Consult Visit    Referring Provider: Dr. Dalbert Garnet   Chief Concern: Endometrial   Subjective:  Barbara Mooney is a 89 y.o. female who is seen in consultation from Dr. Dalbert Garnet for newly diagnosed endometrial cancer.    Patient experienced PMB which prompted ultrasound.    - Uterus: 8.39 x 4.41 x 6.02 cm  - Ovaries: bilaterally wnl  - Other: 27 mm endometrial lining, 2.8 cm posterior fibroid. The uterus had a maximal endometrial thickness of 27 mm. The endometrial contour appeared irregular and generally thickened. Endometrial findings were unable to be fully examined due to the large, solid mass .    05/25/23- Pap NILM   She underwent endometrial biopsy.  WELL DIFFERENTIATED ENDOMETRIOID CARCINOMA (FIGO I).  - Loss of MLH1 and PMS2. MSH2 and MSH6 intact.     She had possible rectal prolapse on exam and was referred to GI. She takes Eliquis for history of a fib.    She has a history of Breast DCIS and Non Hodgkins Lymphoma.    # Right lower inner biopsy- high grade DCIS, lobular neoplasia, calcifications. She underwent lumpectomy with high grade dcis, PLCIS, lobular neoplasia. Followed by radiation x 3 weeks with boost.    # Right LE Non-hodgkin follicular lymphoma s/p surgery at [facility] and RT with Dr Rushie Chestnut in 2018.  She has had radiation to the right lower extremity.   Genetic Testing: 08/26/21- [facility] Multi-cancer panel was negative.    Last colonoscopy in 05/2014- results not available Mammogram 02/25/23- Bi-Rads category 2: benign   Problem List:     Patient Active Problem List    Diagnosis Date Noted   Genetic testing 08/26/2021   Family history of breast cancer 07/21/2021   Family history of ovarian cancer 07/21/2021   Family history of colon cancer 07/21/2021   Family history of Hodgkin's disease 07/21/2021   Hyperlipemia, mixed 07/21/2021   Osteopenia 07/21/2021   Acquired trigger finger 06/11/2021   Ductal carcinoma in situ (DCIS) of right  breast 03/05/2021   Bradycardia 02/01/2019   Closed Colles' fracture 09/23/2018   Follicular non-Hodgkin's lymphoma of bone (HCC) 11/16/2017   Tibial mass 10/13/2017   Malignant neoplasm of right tibia (HCC) 10/07/2017   Type 2 diabetes mellitus with complication, with long-term current use of insulin (HCC) 08/07/2017   Complete tear of right rotator cuff 05/16/2017   Rotator cuff tendinitis, right 05/16/2017   Encounter for anticoagulation discussion and counseling 02/08/2017   Leg swelling 02/08/2017   Non-Hodgkin lymphoma (HCC) 2018   Unsteady gait     Anemia     Paroxysmal atrial fibrillation (HCC)     Essential hypertension     Sepsis (HCC) 10/28/2016   Degenerative tear of lateral meniscus, left 08/09/2016   Primary osteoarthritis of left knee 08/09/2016   Varicose veins of both legs with edema 03/17/2016   Type 2 diabetes mellitus with stage 3 chronic kidney disease, without long-term current use of insulin (HCC) 01/27/2016   Mixed incontinence 06/11/2015   External hemorrhoid 03/11/2015   Gastroesophageal reflux disease without esophagitis 03/11/2015   Onychomycosis of toenail 03/11/2015   Knee strain, right, subsequent encounter 02/28/2015   Primary osteoarthritis of right knee 02/28/2015   Vitamin B 12 deficiency 06/13/2014   Multinodular goiter (nontoxic) 09/07/2011      Past Medical History:     Past Medical History:  Diagnosis Date   Arthritis     Atrial fibrillation (HCC)     Breast cancer (HCC) 03/09/2021  Right Breast   Bruises easily     Current use of long term anticoagulation      Apixaban   Diabetes mellitus     Edema      FEET/ LEGS   Family history of breast cancer     Family history of colon cancer     Family history of Hodgkin's disease     Family history of ovarian cancer     GERD (gastroesophageal reflux disease)     History of echocardiogram      a. 08/2020 Echo: EF 60-65%, no rwma. RVSP 37.80mmHg. Mod dil LA. Mild to mod MR/TR. Mild AoV  sclerosis.   HLD (hyperlipidemia)     Hypertension     Hypothyroidism     Leg pain      a. 08/2020 ABIs: R 1.19, L 0.97.   Lymphoma (HCC) 12/2017    RADIATION RIGHT LEG    MRSA infection      remote history; posterior torso   Multiple thyroid nodules     Non-Hodgkin lymphoma (HCC) 2018   Permanent atrial fibrillation (HCC)      a. Dx 10/2016; b. 01/2017 s/p DCCV-->failed-->rate controlled; c. CHA2DS2VASc = 5-->eliquis.   Personal history of radiation therapy 12/2017    lymphoma   Personal history of radiation therapy 2022    Breast Cancer      Past Surgical History:      Past Surgical History:  Procedure Laterality Date   BONE BIOPSY        2018   BREAST BIOPSY Right 02/26/2021    ffirm bx-"X" clip-HIGH-GRADE DUCTAL CARCINOMA IN SITU   BREAST LUMPECTOMY Right 03/09/2021    Procedure: BREAST LUMPECTOMY;  Surgeon: Earline Mayotte, MD;  Location: [facility] ORS;  Service: General;  Laterality: Right;   CARDIOVERSION N/A 01/20/2017    Procedure: CARDIOVERSION;  Surgeon: Antonieta Iba, MD;  Location: [facility] ORS;  Service: Cardiovascular;  Laterality: N/A;   CARDIOVERSION        AFIB 2018   CATARACT EXTRACTION W/PHACO Right 02/15/2018    Procedure: CATARACT EXTRACTION PHACO AND INTRAOCULAR LENS PLACEMENT (IOC);  Surgeon: Galen Manila, MD;  Location: [facility] ORS;  Service: Ophthalmology;  Laterality: Right;  [place] 01:22.8 AP% 18.9 CDE 15.62 Fluid Pack Lot # [id number]   CATARACT EXTRACTION W/PHACO Left 03/08/2018    Procedure: CATARACT EXTRACTION PHACO AND INTRAOCULAR LENS PLACEMENT (IOC);  Surgeon: Galen Manila, MD;  Location: [facility] ORS;  Service: Ophthalmology;  Laterality: Left;  [place] 01:05 AP% 15.8 CDE 10.28 Fluid pak lot # [id number]   COLONOSCOPY       EYE SURGERY          Past Gynecologic History:  Per HPI   OB History:                  OB History  Gravida Para Term Preterm AB Living  2         2  SAB IAB Ectopic Multiple Live Births                      # Outcome  Date GA Lbr Len/2nd Weight Sex Delivery Anes PTL Lv  2 Gravida                    1 Gravida                        Family History:      Family History  Problem Relation Age of Onset   Breast cancer Sister 61   Heart disease Brother     Ovarian cancer Maternal Aunt          another aunt- colon cancer   Stroke Sister     Arrhythmia Sister          A-Fib   Kidney cancer Neg Hx     Bladder Cancer Neg Hx        Social History: Social History         Socioeconomic History   Marital status: Widowed      Spouse name: Not on file   Number of children: Not on file   Years of education: Not on file   Highest education level: Not on file  Occupational History   Not on file  Tobacco Use   Smoking status: Never   Smokeless tobacco: Never  Vaping Use   Vaping Use: Never used  Substance and Sexual Activity   Alcohol use: No   Drug use: No   Sexual activity: Not Currently  Other Topics Concern   Not on file  Social History Narrative    Lives by self; son lives about 2 miles. Never smoked; no alcohol. Used to [employer] Strain: Not on [employer] Insecurity: Not on file  Transportation Needs: Not on file  Physical Activity: Not on file  Stress: Not on file  Social Connections: Not on file  Intimate Partner Violence: Not on file      Allergies:     Allergies  Allergen Reactions   Nitrofurantoin Diarrhea, Nausea Only and Other (See Comments)   Sulfa Antibiotics Nausea Only and Other (See Comments)      Current Medications:       Current Outpatient Medications  Medication Sig Dispense Refill   acetaminophen (TYLENOL) 500 MG tablet Take 500 mg by mouth at bedtime.       CRANBERRY PO Take 1 capsule by mouth 2 (two) times daily.        cyanocobalamin (,VITAMIN B-12,) 1000 MCG/ML injection Inject 1,000 mcg into the muscle every 30 (thirty) days.        diltiazem (CARDIZEM CD) 180 MG 24 hr capsule Take 1 capsule (180 mg  total) by mouth 2 (two) times daily. 180 capsule 3   ELIQUIS 5 MG TABS tablet TAKE 1 TABLET BY MOUTH TWICE A DAY 180 tablet 1   furosemide (LASIX) 20 MG tablet Take 1 tablet (20 mg total) by mouth daily as needed for fluid or edema (SOB). 90 tablet 3   LACTOBACILLUS PO Take by mouth.       metFORMIN (GLUCOPHAGE) 500 MG tablet Take 500 mg by mouth 2 (two) times daily with a meal.       olmesartan (BENICAR) 40 MG tablet Take 1 tablet (40 mg total) by mouth daily. 90 tablet 3   omeprazole (PRILOSEC) 20 MG capsule Take 1 capsule (20 mg total) by mouth daily. 30 capsule 6   ondansetron (ZOFRAN) 4 MG tablet Take 4 mg by mouth every 8 (eight) hours as needed.       Trolamine Salicylate (BLUE-EMU HEMP EX) Apply 1 application topically daily as needed (Knee pain).        No current facility-administered medications for this visit.    Review of Systems  General: negative for fevers, changes in weight or night sweats Skin: negative for changes in moles or sores or rash  Eyes: negative for changes in vision HEENT: negative for change in hearing, tinnitus, voice changes Pulmonary: negative for dyspnea, orthopnea, productive cough, wheezing Cardiac: negative for palpitations, pain Gastrointestinal: negative for nausea, vomiting, constipation, diarrhea, hematemesis, hematochezia Genitourinary/Sexual: negative for dysuria, retention, hematuria, incontinence Ob/Gyn: abnormal bleeding Musculoskeletal: pain in the legs and weakness Hematology: negative for easy bruising, abnormal bleeding Neurologic/Psych: negative for headaches, seizures, paralysis, numbness   Objective:  Physical Examination:  BP (!) 143/77   Pulse 83   Temp 97.6 F (36.4 C)   Resp 17   Wt 167 lb 3.2 oz (75.8 kg)   BMI 26.99 kg/m    ECOG Performance Status: 1 - Symptomatic but completely ambulatory   GENERAL: Patient is an elderly appearing in no acute distress HEENT:  Sclera clear. Anicteric NODES:  Negative axillary,  supraclavicular, inguinal lymph node survery LUNGS:  Clear to auscultation bilaterally.   HEART:  Regular rate and rhythm.  ABDOMEN:  Soft, nontender.  No hernias, incisions well healed. No masses or ascites EXTREMITIES:  No peripheral edema. Atraumatic. No cyanosis SKIN:  Clear with no obvious rashes or skin changes.  NEURO:  Nonfocal. Well oriented.  Appropriate affect.   Pelvic:Exam chaperoned CMA. Vulva: normal appearing vulva with no masses, tenderness or lesions Vagina: normal vagina Adnexa: normal adnexa in size, nontender and no masses Uterus: uterus is normal size, shape, consistency and nontender Cervix: no cervical motion tenderness and no lesions Rectal: not indicated       Lab Review 01/28/2023 Thyroid Stimulating Hormone (TSH) 0.450-5.330 uIU/ml uIU/mL 2.040    HbA1c, CBC, CMP, TSH ordered   Radiologic Imaging: As per HPI    Assessment:  Barbara Mooney is a 89 y.o. 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   Type 2 diabetes mellitus   Hypothyroidism   A-fib on chronic long-term anticoagulation   Personal and family history of malignancy.  Prior genetic testing negative.   Medical co-morbidities complicating care: diabetes. Non-Hodgkin lymphoma and breast cancer (DCIS) status post radiation therapy Plan:    Problem List Items Addressed This Visit   None Visit Diagnoses       Endometrial cancer determined by uterine biopsy (HCC)    -  Primary    Relevant Orders    CBC with Differential/Platelet    Comprehensive metabolic panel    TSH    Hemoglobin A1c    DG Chest 2 View    Counseling and coordination of care               We discussed options for management inclu surgery, radiation or hormonal therapy.  Her performance status is a 1-2 and she does appear to be frail, but overall great health given her age. We reviewed the higher curative rates with  surgery, we recommend surgery as long as she is cleared by cardiology and anesthesia with laparoscopic total hysterectomy, bilateral salpingo-oophorectomy, sentinel lymph node injection with ICG, mapping, biopsy of sentinel lymph nodes, possible node dissection, possible laparotomy on 07/13/2023 with Dr. Johnnette Litter.  We will need to confirm that Dr. Dalbert Garnet is available to assist with surgery.    Risks of surgery were discussed in detail. These include infection, anesthesia, bleeding, transfusion, wound separation, hernia, vaginal cuff dehiscence, medical issues (blood clots, stroke, heart attack, fluid in the lungs, pneumonia, abnormal heart rhythm, death), possible exploratory surgery with larger incision, lymphedema, lymphocyst, allergic reaction, injury to adjacent organs (bowel, bladder, blood vessels,  nerves, ureters).  She may be at higher risk for blood clots given her personal history and stressed the importance of early and adequate ambulation.  She will receive DVT/VTE prophylaxis prophylaxis as well as restarting her Eliquis the day after surgery.  We recommend that she stop her Eliquis 3 days before surgery and this will be messaged to her by Consuello Masse.  We just want to confirm this plan with cardiology and also check on her creatinine.    GYN VTE Prophylaxis Risk point calculation: +3  age >74 +1  BMI 25-30 TOTAL: 4    VTE Risk assessment:  This patient has cancer, elevated tumor markers or personal/family hx VTE, and does not have disseminated cancer, or planned open or radical surgery.  The calculated risk score is 0-4.     High Risk:   One pre-op dose UFH, then continuous post-op dosing with LMWH or UFH while inpatient AND SCDs.   We ordered TSH CBC CMP and hemoglobin A1c today.  Chest x-ray obtained to assess for pulmonary mets.  We reached out to cardiology regarding their request for EKG and ECHO or other testing.   We discussed that her pathology will be reviewed after  surgery and we can make a determination if any further treatment may be needed with radiation or chemotherapy.       Suggested return to clinic in  4 -6 weeks after surgery.     The patient's diagnosis, an outline of the further diagnostic and laboratory studies which will be required, the recommendation, and alternatives were discussed.  All questions were answered to the patient's satisfaction.   A total of 90 minutes were spent with the patient/family today; >50% was spent in education, counseling and coordination of care for endometrial cancer.     [name], MD       CC:

## 2023-07-27 NOTE — Anesthesia Postprocedure Evaluation (Signed)
Anesthesia Post Note  Patient: Barbara Mooney  Procedure(s) Performed: TOTAL LAPAROSCOPIC HYSTERECTOMY WITH BILATERAL SALPINGO OOPHORECTOMY, SENTINEL LYMPH NODE INJECTION AND MAPPING, NODE DISSECTION (Abdomen)  Patient location during evaluation: PACU Anesthesia Type: General Level of consciousness: awake and alert Pain management: pain level controlled Vital Signs Assessment: post-procedure vital signs reviewed and stable Respiratory status: spontaneous breathing, nonlabored ventilation, respiratory function stable and patient connected to nasal cannula oxygen Cardiovascular status: blood pressure returned to baseline and stable Postop Assessment: no apparent nausea or vomiting Anesthetic complications: no   No notable events documented.   Last Vitals:  Vitals:   07/27/23 1135 07/27/23 1140  BP:    Pulse: 70 (!) 59  Resp: 11 11  Temp:    SpO2: 97% 97%    Last Pain:  Vitals:   07/27/23 1130  TempSrc:   PainSc: Asleep                 Cleda Mccreedy 

## 2023-07-27 NOTE — Op Note (Signed)
Operative Note   07/27/2023 12:30 PM  PRE-OP DIAGNOSIS: Endometrial cancer, grade 1   POST-OP DIAGNOSIS: same  SURGEON: Surgeons and Role:    [name], MD - Primary    Christeen Douglas, MD  ANESTHESIA: General   PROCEDURE: Procedure(s): TOTAL LAPAROSCOPIC HYSTERECTOMY WITH BILATERAL SALPINGO OOPHORECTOMY, PELVIC SENTINEL LYMPH NODE INJECTION AND MAPPING, BILATERAL PELVIC LYMPH NODE BIOPSIES   ESTIMATED BLOOD LOSS: less than 50 mL  TOTAL IV FLUIDS: per Anesthesis  SPECIMENS: Uterus, tubes, ovaries, pelvic washings  COMPLICATIONS: none  DISPOSITION: PACU - hemodynamically stable.  CONDITION: stable  INDICATIONS: Grade 1 uterine cancer  FINDINGS: Large prolapsing hemorrhoids, normal pelvic and abdominal organs.    PROCEDURE IN DETAIL: After informed consent was obtained, the patient was taken to the operating room where anesthesia was obtained without difficulty. The patient was positioned in the dorsal lithotomy position in [place] stirrups and her arms were carefully tucked at her sides and the usual precautions were taken.  She was prepped and draped in normal sterile fashion.  Time-out was performed and a Foley catheter was placed into the bladder and the cervix was infiltrated with 4 ml of ICG fluorescent dye at 3 an 9 o'clock both superficial and deep injections. A standard VCare uterine manipulator was then placed in the uterus without incident.    An open Hasson technique was used to place an infraumbilical 10-mm baloon trocar under direct visualization. The laparoscope was introduced and CO2 gas was infused for pneumoperitoneum to a pressure of 15 mm Hg.  Right and left lateral 5-mm ports and a 5-12 mm suprapubic port were placed under direct visualization of the laparoscope using an EndoStep technique.  Cytologic washings were obtained.    The patient was placed in Trendelenburg and the bowel was displaced up into the upper abdomen.  Round ligaments were  divided on each side with the EndoShears and the retroperitoneal space was opened bilaterally.  The ureters were identified and preserved.  At this point the retroperitoneal spaces were developed and the lymphatic channels were mapped to the external iliac vessels on each side.  The sentinel node on the right side was then identified, skeletonized and removed taking care not to injure the  obturator nerve, the ureter or the pelvic vasculature.  Similarly on the left side, the retroperitoneal spaces were developed, the lymphatic channels mapped to identify the sentinel node(s) and they were similarly removed with care to preserve the obturator nerve, the ureter and the pelvic vasculature. With hemostasis secured, the infundibulopelvic ligaments were skeletonized, sealed and divided with the LigaSure device.    A bladder flap was created and the bladder was dissected down off the lower uterine segment and cervix using endoshears and electrocautery.  The uterine arteries were skeletonized bilaterally, sealed and divided with the LigaSure device.  A colpotomy was performed circumferentially along the V-Care ring with electrocautery and the cervix was incised from the vagina and the specimen was removed through the vagina.  A pneumo balloon was placed in the vagina and the vaginal cuff was then closed in a running continuous fashion using the EndoStitch technique with 0 V-Lock suture with careful attention to include the vaginal cuff angles and the vaginal mucosa within the closure.  Intraoperative pathologic evaluation revealed favorable risk criteria and therefore further dissection was not performed.  Hemostasis was observed. The intraperitoneal pressure was dropped, and all planes of dissection, vascular pedicles and the vaginal cuff were found to be hemostatic.  The suprapubic trocar was  removed and the fascia was closed with 0 Vicryl suture using the Endoclose technique. The lateral trocars were removed under  visualization.   Before the umbilical trocar was removed the CO2 gas was released.  The fascia there was closed with 0 Vicryl suture in interrupted figure of eight technique.   The skin incisions were closed with subcuticular stitches and glue. The patient tolerated the procedure well.  Sponge, lap and needle counts were correct x2.  The patient was taken to recovery room in excellent condition.  Antibiotics: "Given 1st or 2nd generation cephalosporin, Antibiotics given within 1 hour of the start of the procedure, Antibiotics ordered to be discontinued within 24 hours post procedure"   VTE prophylaxis: was ordered perioperatively with SQ heparin 5000 U and IPCs.   [name], MD

## 2023-07-28 DIAGNOSIS — C541 Malignant neoplasm of endometrium: Secondary | ICD-10-CM | POA: Diagnosis not present

## 2023-07-28 NOTE — Discharge Summary (Signed)
1 Day Post-Op       Procedure(s): TOTAL LAPAROSCOPIC HYSTERECTOMY WITH BILATERAL SALPINGO OOPHORECTOMY, SENTINEL LYMPH NODE INJECTION AND MAPPING, NODE DISSECTION (N/A) Subjective: The patient is doing well.  No nausea or vomiting. Pain is adequately controlled.  Objective: Vital signs in last 24 hours: Temp:  [97.2 F (36.2 C)-98.3 F (36.8 C)] 97.8 F (36.6 C) (08/15 0809) Pulse Rate:  [65-130] 74 (08/15 0809) Resp:  [13-21] 18 (08/15 0809) BP: (105-151)/(59-91) 130/80 (08/15 0809) SpO2:  [93 %-98 %] 98 % (08/15 0809) Weight:  [76 kg] 76 kg (08/14 1459)  Intake/Output  Intake/Output Summary (Last 24 hours) at 07/28/2023 1458 Last data filed at 07/27/2023 1933 Gross per 24 hour  Intake 820 ml  Output 125 ml  Net 695 ml    Physical Exam:  General: Alert and oriented. CV: RRR Lungs: Clear bilaterally. GI: Soft, Nondistended. Incisions: Clean and dry. Urine: Clear Extremities: Nontender, no erythema, no edema.  Lab Results: Recent Labs    07/27/23 1614  HGB 12.1  HCT 38.2  WBC 10.7*  PLT 270                 Results for orders placed or performed during the hospital encounter of 07/27/23 (from the past 24 hour(s))  Glucose, capillary     Status: Abnormal   Collection Time: 07/27/23  3:44 PM  Result Value Ref Range   Glucose-Capillary 154 (H) 70 - 99 mg/dL  CBC     Status: Abnormal   Collection Time: 07/27/23  4:14 PM  Result Value Ref Range   WBC 10.7 (H) 4.0 - 10.5 K/uL   RBC 4.34 3.87 - 5.11 MIL/uL   Hemoglobin 12.1 12.0 - 15.0 g/dL   HCT 09.8 11.9 - 14.7 %   MCV 88.0 80.0 - 100.0 fL   MCH 27.9 26.0 - 34.0 pg   MCHC 31.7 30.0 - 36.0 g/dL   RDW 82.9 (H) 56.2 - 13.0 %   Platelets 270 150 - 400 K/uL   nRBC 0.0 0.0 - 0.2 %  Basic metabolic panel     Status: Abnormal   Collection Time: 07/27/23  4:14 PM  Result Value Ref Range   Sodium 138 135 - 145 mmol/L   Potassium 3.9 3.5 - 5.1 mmol/L   Chloride 103 98 - 111 mmol/L   CO2 25 22 - 32 mmol/L    Glucose, Bld 176 (H) 70 - 99 mg/dL   BUN 16 8 - 23 mg/dL   Creatinine, Ser 8.65 0.44 - 1.00 mg/dL   Calcium 8.8 (L) 8.9 - 10.3 mg/dL   GFR, Estimated 59 (L) >60 mL/min   Anion gap 10 5 - 15  Glucose, capillary     Status: Abnormal   Collection Time: 07/27/23  4:59 PM  Result Value Ref Range   Glucose-Capillary 162 (H) 70 - 99 mg/dL    Assessment/Plan: 1 Day Post-Op       Procedure(s): TOTAL LAPAROSCOPIC HYSTERECTOMY WITH BILATERAL SALPINGO OOPHORECTOMY, SENTINEL LYMPH NODE INJECTION AND MAPPING, NODE DISSECTION (N/A)  -Ambulate, Incentive spirometry -Advance diet as tolerated -Transition to oral pain medication -Discharge home today anticipated    Christeen Douglas, MD   LOS: 0 days   Christeen Douglas 07/28/2023, 2:58 PM

## 2023-07-28 NOTE — Progress Notes (Signed)
Discharge instructions given with daughter at bedside. No concerns voiced. Pt and daughter encouraged to stop by pharmacy and pick up meds that were e-prescribed. Both voiced understanding. Patient left unit in wheelchair pushed by [place], NT. Left in stable condition.

## 2023-07-28 NOTE — TOC CM/SW Note (Signed)
Transition of Care [facility]) - Inpatient Brief Assessment   Patient Details  Name: Barbara Mooney MRN: [id number] Date of Birth: [birth year]  Transition of Care  [facility]) CM/SW Contact:    Chapman Fitch, RN Phone Number: 07/28/2023, 9:23 AM   Clinical Narrative:    Transition of Care [facility]) Screening Note   Patient Details  Name: Barbara Mooney Date of Birth: October 27, 1934   Transition of Care [facility]) CM/SW Contact:    Chapman Fitch, RN Phone Number: 07/28/2023, 9:23 AM    Transition of Care Department (TOC) has reviewed patient and no TOC needs have been identified at this time. We will continue to monitor patient advancement through interdisciplinary progression rounds. If new patient transition needs arise, please place a TOC consult.   Transition of Care Asessment: Insurance and Status: Insurance coverage has been reviewed Patient has primary care physician: Yes     Prior/Current Home Services: No current home services Social Determinants of Health Reivew: SDOH reviewed no interventions necessary Readmission risk has been reviewed: Yes Transition of care needs: no transition of care needs at this time

## 2023-07-29 ENCOUNTER — Encounter: Payer: Self-pay | Admitting: Obstetrics and Gynecology

## 2023-07-29 ENCOUNTER — Telehealth: Payer: Self-pay | Admitting: Nurse Practitioner

## 2023-07-29 NOTE — Telephone Encounter (Signed)
Called patient to check on her after her surgery. Spoke to [place]. She has not taken any oxycodone pain medication. Pain controlled. Reviewed restarting eliquis. Reviewed precautions for lifting, avoidance of constipation, and vaginal rest. Reviewed post op appointments and if concerning symptoms to call us or Dr Dalbert Garnet.

## 2023-07-30 LAB — TYPE AND SCREEN

## 2023-08-01 LAB — SURGICAL PATHOLOGY

## 2023-08-02 ENCOUNTER — Telehealth: Payer: Self-pay | Admitting: Nurse Practitioner

## 2023-08-02 ENCOUNTER — Ambulatory Visit: Payer: Medicare Other | Admitting: Cardiovascular Disease

## 2023-08-02 DIAGNOSIS — C541 Malignant neoplasm of endometrium: Secondary | ICD-10-CM

## 2023-08-02 NOTE — Telephone Encounter (Signed)
Pathology reviewed and forwarded to Dr Johnnette Litter. Adjuvant treatment, possibly radiation, will likely be recommended. I've reached out to patient to see if we can see her in clinic to review pathology and check incisions. No answer. Left vm for patient and her daughter. Will send message to scheduling team to get scheduled for next week.

## 2023-08-10 ENCOUNTER — Other Ambulatory Visit: Payer: Self-pay

## 2023-08-10 ENCOUNTER — Inpatient Hospital Stay: Payer: Medicare Other | Attending: Obstetrics and Gynecology | Admitting: Obstetrics and Gynecology

## 2023-08-10 VITALS — BP 134/57 | HR 72 | Temp 98.9°F | Resp 20 | Wt 167.5 lb

## 2023-08-10 DIAGNOSIS — Z90722 Acquired absence of ovaries, bilateral: Secondary | ICD-10-CM | POA: Diagnosis not present

## 2023-08-10 DIAGNOSIS — Z90710 Acquired absence of both cervix and uterus: Secondary | ICD-10-CM | POA: Insufficient documentation

## 2023-08-10 DIAGNOSIS — C541 Malignant neoplasm of endometrium: Secondary | ICD-10-CM | POA: Diagnosis present

## 2023-08-10 DIAGNOSIS — Z7189 Other specified counseling: Secondary | ICD-10-CM

## 2023-08-10 NOTE — Progress Notes (Signed)
Gynecologic Oncology Consult Visit   Referring Provider: Dr. Dalbert Garnet  Chief Concern: Endometrial cancer, post op visit  Subjective:  Barbara Mooney is a 89 y.o. female who is seen in consultation from Dr. Dalbert Garnet for grade 2 endometrial cancer.   07/27/23 TLH, BSO, SLN mapping and biopsies. Pathology showed 8/10 mm invasion with LVSI and cervical stromal involvement. Bilateral SLNs with ITCs.  Washings negative.   No new complaints today.  Incisions healing slowly.   Diagnosis 1. Lymph node, sentinel, biopsy, Left external iliac - ONE LYMPH NODE WITH ISOLATED TUMOR CELLS. - PANCYTOKERATIN STAIN CONFIRMS. 2. Lymph node, sentinel, biopsy, Right external iliac - ONE LYMPH NODE WITH ISOLATED TUMOR CELLS. - PANCYTOKERATIN STAIN CONFIRMS. 3. Uterus +/- tubes/ovaries, neoplastic - ENDOMETRIOID CARCINOMA WITH MICROCYSTIC, ELONGATED AND FRAGMENTED (MELF) PATTERN OF INVASION. - CARCINOMA INVADES STROMAL CONNECTIVE TISSUE OF CERVIX. - SEE CANCER SUMMARY BELOW. - BACKGROUND ATROPHIC ENDOMETRIUM WITH ATYPICAL ENDOMETRIAL HYPERPLASIA/EIN. - MYOMETRIUM WITH LEIOMYOMATA, LARGEST MEASURING 1.2 CM. - RIGHT PARATUBAL CYST. - BILATERAL FALLOPIAN TUBES WITH FIMBRIATED END. - BILATERAL OVARIES WITH NO PATHOLOGIC ABNORMALITY. Microscopic Comment 3. CASE SUMMARY: (ENDOMETRIUM) Standard(s): AJCC-UICC 8, FIGO Cancer Report 2023 SPECIMEN Procedure: Total hysterectomy and bilateral salpingo-oophorectomy TUMOR Histologic Type: Endometrioid carcinoma, NOS Histologic Grade: FIGO grade 2 Myometrial Invasion: Present Depth of invasion (millimeters): 8 mm Myometrial thickness (millimeters): 10 mm Percentage of myometrial invasion: 80%   Microscopic Comment(continued) Uterine Serosa Involvement: Not identified Lower Uterine Segment Involvement: Present, myoinvasive Cervical Stromal Involvement: Present Other Tissue/Organ Involvement: Not applicable Lymphatic and/or Vascular Invasion: Present  Gyn  Oncology History Patient experienced PMB which prompted ultrasound.   - Uterus: 8.39 x 4.41 x 6.02 cm  - Ovaries: bilaterally wnl  - Other: 27 mm endometrial lining, 2.8 cm posterior fibroid. The uterus had a maximal endometrial thickness of 27 mm. The endometrial contour appeared irregular and generally thickened. Endometrial findings were unable to be fully examined due to the large, solid mass .   05/25/23- Pap NILM  She underwent endometrial biopsy.  WELL DIFFERENTIATED ENDOMETRIOID CARCINOMA (FIGO I).  - Loss of MLH1 and PMS2. MSH2 and MSH6 intact.    She had possible rectal prolapse on exam and was referred to GI. She takes Eliquis for history of a fib.   She has a history of Breast DCIS and Non Hodgkins Lymphoma.   # Right lower inner biopsy- high grade DCIS, lobular neoplasia, calcifications. She underwent lumpectomy with high grade dcis, PLCIS, lobular neoplasia. Followed by radiation x 3 weeks with boost.    # Right LE Non-hodgkin follicular lymphoma s/p surgery at [facility] and RT with Dr Rushie Chestnut in 2018.  She has had radiation to the right lower extremity.  Genetic Testing: 08/26/21- [facility] Multi-cancer panel was negative.   Last colonoscopy in 05/2014- results not available Mammogram 02/25/23- Bi-Rads category 2: benign  Problem List: Patient Active Problem List   Diagnosis Date Noted   Endometrial cancer, grade I (HCC) 07/27/2023   Genetic testing 08/26/2021   Family history of breast cancer 07/21/2021   Family history of ovarian cancer 07/21/2021   Family history of colon cancer 07/21/2021   Family history of Hodgkin's disease 07/21/2021   Hyperlipemia, mixed 07/21/2021   Osteopenia 07/21/2021   Acquired trigger finger 06/11/2021   Ductal carcinoma in situ (DCIS) of right breast 03/05/2021   Bradycardia 02/01/2019   Closed Colles' fracture 09/23/2018   Follicular non-Hodgkin's lymphoma of bone (HCC) 11/16/2017   Tibial mass 10/13/2017   Malignant neoplasm of right  tibia (HCC)  10/07/2017   Type 2 diabetes mellitus with complication, with long-term current use of insulin (HCC) 08/07/2017   Complete tear of right rotator cuff 05/16/2017   Rotator cuff tendinitis, right 05/16/2017   Encounter for anticoagulation discussion and counseling 02/08/2017   Leg swelling 02/08/2017   Non-Hodgkin lymphoma (HCC) 2018   Unsteady gait    Anemia    Paroxysmal atrial fibrillation (HCC)    Essential hypertension    Sepsis (HCC) 10/28/2016   Degenerative tear of lateral meniscus, left 08/09/2016   Primary osteoarthritis of left knee 08/09/2016   Varicose veins of both legs with edema 03/17/2016   Type 2 diabetes mellitus with stage 3 chronic kidney disease, without long-term current use of insulin (HCC) 01/27/2016   Mixed incontinence 06/11/2015   External hemorrhoid 03/11/2015   Gastroesophageal reflux disease without esophagitis 03/11/2015   Onychomycosis of toenail 03/11/2015   Knee strain, right, subsequent encounter 02/28/2015   Primary osteoarthritis of right knee 02/28/2015   Vitamin B 12 deficiency 06/13/2014   Multinodular goiter (nontoxic) 09/07/2011    Past Medical History: 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   a.) stage 0 (high grade cTis (DCIS), cN0, cM0) with comedonecrosis -- > s/p lumpectomy + XRT   Endometrial cancer (HCC) 05/25/2023   a.) pathology (+) for well differentiated endometroid carcinoma (FIGO 1)   Family history of breast cancer    Family history of colon cancer    Family history of Hodgkin's disease    Family history of ovarian cancer    Follicular non-Hodgkin's lymphoma (HCC) 2018   a.) 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 of the RIGHT  proximal tibia s/p XRT   GERD (gastroesophageal reflux disease)    History of bilateral cataract extraction 02/2018   History of echocardiogram    a.) TTE 10/31/2016: EF 55-60%, mild LVH, mild MAC, mod LAE, triv AR, mild TR; b.)  TTE 08/21/2020: EF 60-65%, no RWMAs. RVSP 37.52mmHg. Mod dil LA. Mild to mod MR/TR. Mild AoV sclerosis; c.) TTE 07/08/2023: EF 60-65%, mild RVE, sev BAE, mod MAC, mild-mod TR, mild MR, RVSP 30.5   History of hiatal hernia    HLD (hyperlipidemia)    HOH (hard of hearing)    Hypertension    Hypokalemia    Hypothyroidism    Leg pain    a. 08/2020 ABIs: R 1.19, L 0.97.   Mixed incontinence    MRSA infection    a.) remote history; posterior torso   Multiple thyroid nodules    On apixaban therapy    Peripheral edema    Permanent atrial fibrillation (HCC) 10/2016   a.) CHA2DS2VASc = 6 (age x2, sex, HTN, vascular disease history, T2DM);  b.) s/p DCCV 01/20/2017 (150 J x 1) --> failed; c.) rate/rhythm maintained on oral diltiazem; chronically anticoagulated with apixaban   RBBB (right bundle branch block)    Recurrent UTI (urinary tract infection)    Sepsis (HCC)    Unsteady gait    Varicose veins of both lower extremities    Vitamin B12 deficiency     Past Surgical History: Past Surgical History:  Procedure Laterality Date   BONE BIOPSY Right 2018  tibia   BREAST BIOPSY Right 02/26/2021   ffirm bx-"X" clip-HIGH-GRADE DUCTAL CARCINOMA IN SITU   BREAST LUMPECTOMY Right 03/09/2021   Procedure: BREAST LUMPECTOMY;  Surgeon: Earline Mayotte, MD;  Location: [facility] OR[facility];  Service: General;  Laterality: Right;   CARDIOVERSION N/A 01/20/2017   Procedure: CARDIOVERSION;  Surgeon: Antonieta Iba, MD;  Location: [facility];  Service: Cardiovascular;  Laterality: N/A;   CATARACT EXTRACTION W/PHACO Right 02/15/2018   Procedure: CATARACT EXTRACTION PHACO AND INTRAOCULAR LENS PLACEMENT (IOC);  Surgeon: Galen Manila, MD;  Location: [facility];  Service: Ophthalmology;   Laterality: Right;  [place] 01:22.8 AP% 18.9 CDE 15.62 Fluid Pack Lot # [id number]   CATARACT EXTRACTION W/PHACO Left 03/08/2018   Procedure: CATARACT EXTRACTION PHACO AND INTRAOCULAR LENS PLACEMENT (IOC);  Surgeon: Galen Manila, MD;  Location: [facility];  Service: Ophthalmology;  Laterality: Left;  [place] 01:05 AP% 15.8 CDE 10.28 Fluid pak lot # [id number]   COLONOSCOPY     TOTAL LAPAROSCOPIC HYSTERECTOMY WITH BILATERAL SALPINGO OOPHORECTOMY N/A 07/27/2023   Procedure: TOTAL LAPAROSCOPIC HYSTERECTOMY WITH BILATERAL SALPINGO OOPHORECTOMY, SENTINEL LYMPH NODE INJECTION AND MAPPING, NODE DISSECTION;  Surgeon: [name], MD;  Location: [facility];  Service: Gynecology;  Laterality: N/A;    Past Gynecologic History:  Per HPI  OB History:  OB History  Gravida Para Term Preterm AB Living  2         2  SAB IAB Ectopic Multiple Live Births               # Outcome Date GA Lbr Len/2nd Weight Sex Type Anes PTL Lv  2 Gravida           1 Gravida             Family History: Family History  Problem Relation Age of Onset   Breast cancer Sister 81   Stroke Sister    Atrial fibrillation Sister    Heart disease Brother    Ovarian cancer Maternal Aunt    Colon cancer Maternal Aunt    Kidney cancer Neg Hx    Bladder Cancer Neg Hx     Social History: Social History   Socioeconomic History   Marital status: Widowed    Spouse name: Not on file   Number of children: Not on file   Years of education: Not on file   Highest education level: Not on file  Occupational History   Not on file  Tobacco Use   Smoking status: Never   Smokeless tobacco: Never  Vaping Use   Vaping status: Never Used  Substance and Sexual Activity   Alcohol use: No   Drug use: No   Sexual activity: Not Currently  Other Topics Concern   Not on file  Social History Narrative   Lives by self; son lives about 2 miles. Never smoked; no alcohol. Used to [employer]: Low Risk  (07/20/2023)   Received from [facility]   Overall Financial Resource Strain (CARDIA)    Difficulty of Paying Living Expenses: Not hard at all  Food Insecurity: No Food Insecurity (07/27/2023)   Hunger Vital Sign    Worried About Running Out of Food in the Last Year: Never true    Ran Out of Food in the Last Year: Never true  Transportation Needs: No Transportation Needs (07/27/2023)   PRAPARE - Administrator, Civil Service (Medical): No    Lack of Transportation (  Non-Medical): No  Physical Activity: Not on file  Stress: Not on file  Social Connections: Not on file  Intimate Partner Violence: Not At Risk (07/27/2023)   Humiliation, Afraid, Rape, and Kick questionnaire    Fear of Current or Ex-Partner: No    Emotionally Abused: No    Physically Abused: No    Sexually Abused: No    Allergies: Allergies  Allergen Reactions   Nitrofurantoin Diarrhea, Nausea Only and Other (See Comments)   Sulfa Antibiotics Nausea Only and Other (See Comments)    Current Medications: Current Outpatient Medications  Medication Sig Dispense Refill   acetaminophen (TYLENOL) 500 MG tablet Take 500 mg by mouth 3 (three) times daily.     CRANBERRY PO Take 1 capsule by mouth 2 (two) times daily.      cyanocobalamin (,VITAMIN B-12,) 1000 MCG/ML injection Inject 1,000 mcg into the muscle every 30 (thirty) days.      diltiazem (CARDIZEM CD) 180 MG 24 hr capsule Take 1 capsule (180 mg total) by mouth 2 (two) times daily. 180 capsule 3   docusate sodium (COLACE) 100 MG capsule Take 1 capsule (100 mg total) by mouth 2 (two) times daily. To keep stools soft 30 capsule 0   ELIQUIS 5 MG TABS tablet TAKE 1 TABLET BY MOUTH TWICE A DAY 180 tablet 1   furosemide (LASIX) 20 MG tablet Take 1 tablet (20 mg total) by mouth daily as needed for fluid or edema (SOB). 90 tablet 3   LACTOBACILLUS PO Take 1 tablet by mouth daily at 6 (six) AM.     metFORMIN (GLUCOPHAGE) 500 MG tablet  Take 500 mg by mouth 2 (two) times daily with a meal.     olmesartan (BENICAR) 40 MG tablet TAKE 1 TABLET BY MOUTH EVERY DAY (Patient taking differently: Take 40 mg by mouth every morning.) 90 tablet 0   omeprazole (PRILOSEC) 20 MG capsule Take 1 capsule (20 mg total) by mouth daily. (Patient taking differently: Take 20 mg by mouth every morning.) 30 capsule 6   ondansetron (ZOFRAN) 4 MG tablet Take 4 mg by mouth every 8 (eight) hours as needed.     oxyCODONE (OXY IR/ROXICODONE) 5 MG immediate release tablet Take 1 tablet (5 mg total) by mouth every 4 (four) hours as needed for severe pain. 6 tablet 0   Trolamine Salicylate (BLUE-EMU HEMP EX) Apply 1 application topically daily as needed (Knee pain).     No current facility-administered medications for this visit.   Review of Systems  General: negative for fevers, changes in weight or night sweats Skin: negative for changes in moles or sores or rash Eyes: negative for changes in vision HEENT: negative for change in hearing, tinnitus, voice changes Pulmonary: negative for dyspnea, orthopnea, productive cough, wheezing Cardiac: negative for palpitations, pain Gastrointestinal: negative for nausea, vomiting, constipation, diarrhea, hematemesis, hematochezia Genitourinary/Sexual: negative for dysuria, retention, hematuria, incontinence Ob/Gyn: abnormal bleeding Musculoskeletal: pain in the legs and weakness Hematology: negative for easy bruising, abnormal bleeding Neurologic/Psych: negative for headaches, seizures, paralysis, numbness  Objective:  Physical Examination:  BP (!) 134/57   Pulse 72   Temp 98.9 F (37.2 C)   Resp 20   Wt 167 lb 8 oz (76 kg)   SpO2 100%   BMI 27.04 kg/m    ECOG Performance Status: 1 - Symptomatic but completely ambulatory  GENERAL: Patient is an elderly appearing in no acute distress HEENT:  Sclera clear. Anicteric NODES:  Negative axillary, supraclavicular, inguinal lymph node survery LUNGS:  Clear to  auscultation bilaterally.   HEART:  Regular rate and rhythm.  ABDOMEN:  Soft, nontender.  No hernias, LS incisions intact, healing slowly with a bit of redness. No masses or ascites EXTREMITIES:  No peripheral edema. Atraumatic. No cyanosis SKIN:  Clear with no obvious rashes or skin changes.  NEURO:  Nonfocal. Well oriented.  Appropriate affect.      Lab Review 01/28/2023 Thyroid Stimulating Hormone (TSH) 0.450-5.330 uIU/ml uIU/mL 2.040   HbA1c, CBC, CMP, TSH ordered  Radiologic Imaging: As per HPI    Assessment:  Barbara Mooney is a 89 y.o. female diagnosed with PMB diagnosed 6/24 with grade 1 endometrioid endometrial cancer with deficient mismatch repair protein expression (Loss of MLH1 and PMS2) on endometrial biopsy.  07/27/23 underwent TLH, BSO, SLN mapping and biopsies. Pathology showed Stage IB grade 2 adenocarcinoma with 8/10 mm invasion with LVSI and cervical stromal involvement. Bilateral SLNs with ITCs.  Washings negative.   Type 2 diabetes mellitus  Hypothyroidism  A-fib on chronic long-term anticoagulation  Personal and family history of malignancy.  Prior genetic testing negative.  Medical co-morbidities complicating care: diabetes. Non-Hodgkin lymphoma and breast cancer (DCIS) status post radiation therapy Plan:   Problem List Items Addressed This Visit       Genitourinary   Endometrial cancer, grade I (HCC) - Primary   Discussed with patient and daughter.  In view of grade 2 cancer with deep invasion, cervical stromal involvement, ITCs and LVSI recommend external pelvic radiation. Discussed that vaginal brachytherapy would be an option to reduce vaginal recurrence, but am concerned about her risk of local/regional recurrence beyond the vagina.  Discussed that I would not recommend adjuvant chemotherapy.    Will place referral to Dr Rushie Chestnut for external pelvic radiation. We will see her back in a few weeks for post op exam.    [name],  MD    CC:   Dr. Dalbert Garnet

## 2023-08-22 ENCOUNTER — Ambulatory Visit
Admission: RE | Admit: 2023-08-22 | Discharge: 2023-08-22 | Disposition: A | Discharge status: Home / Self Care | Payer: Medicare Other | Source: Ambulatory Visit | Attending: Radiation Oncology | Admitting: Radiation Oncology

## 2023-08-22 ENCOUNTER — Encounter: Payer: Self-pay | Admitting: Radiation Oncology

## 2023-08-22 VITALS — BP 128/79 | HR 98 | Temp 98.6°F | Resp 12

## 2023-08-22 DIAGNOSIS — C541 Malignant neoplasm of endometrium: Secondary | ICD-10-CM | POA: Insufficient documentation

## 2023-08-22 NOTE — Progress Notes (Signed)
Radiation Oncology Follow up Note [facility]NA endometrial cancer  Name: Barbara Mooney   Date:   08/22/2023 MRN:  [id number] DOB: August 05, 1934    This 89 y.o. female presents to the clinic today for evaluation of grade 2 endometrial carcinoma status post TLH BSO SLN mapping with deep myometrial invasion and cervical stromal involvement.  REFERRING PROVIDER: Patrice Paradise, MD  HPI: Patient is an 89 year old female well-known to our department.  She was originally treated over 2 years prior whole breast radiation to her right breast for high-grade ductal carcinoma in situ ER positive.  She also was treated back in 2018 to her right knee for stage Ia follicular lymphoma.  Both of those malignancies are in remission.  She recently presented with postmenopausal bleeding and was found to have endometrial grade 2 carcinoma.  She underwent 824 TLH BSO SLN mapping and biopsies.  She had an 8 of 10 mm of invasion with lymphovascular stromal invasion and cervical stromal involvement.  Bilateral sentinel lymph nodes and ITC's were negative.  Myometrial invasion was 80%.  Her tumor showed loss of MLH1 and PMS2. MSH2 and MSH6 intact.  Based on the poor prognostic factors of deep invasion cervical stromal involvement LVSI and ITC recommendation for external pelvic radiation was made.  She is seen today doing fairly well she states she tends towards constipation no increased lower urinary tract symptoms.  She is having no abdominal pain or vaginal bleeding.  COMPLICATIONS OF TREATMENT: none  FOLLOW UP COMPLIANCE: keeps appointments   PHYSICAL EXAM:  BP 128/79   Pulse 98   Temp 98.6 F (37 C) (Tympanic)   Resp 12  Well-developed well-nourished patient in NAD. HEENT reveals PERLA, EOMI, discs not visualized.  Oral cavity is clear. No oral mucosal lesions are identified. Neck is clear without evidence of cervical or supraclavicular adenopathy. Lungs are clear to A&P. Cardiac examination is essentially unremarkable  with regular rate and rhythm without murmur rub or thrill. Abdomen is benign with no organomegaly or masses noted. Motor sensory and DTR levels are equal and symmetric in the upper and lower extremities. Cranial nerves II through XII are grossly intact. Proprioception is intact. No peripheral adenopathy or edema is identified. No motor or sensory levels are noted. Crude visual fields are within normal range.  RADIOLOGY RESULTS: No current films for review  PLAN: Present time I have recommended IMRT radiation therapy to her pelvis.  Would plan on delivering 50 Gray over 5 weeks.  Would use IMRT to spare critical structures such as small bowel rectum spare bone marrow and bladder.  Risks and benefits of treatment including increased lower Neri tract symptoms possible diarrhea fatigue alteration blood counts skin reaction all were reviewed in detail with the patient and her daughter.  Will also refer her to Dr. Marinell Blight for possible vaginal brachytherapy.  That will be his call and we will obtain that consultation the next couple of weeks.  Patient and daughter both comprehend my recommendations well.  I would like to take this opportunity to thank you for allowing me to participate in the care of your patient.Barbara Miller, MD

## 2023-08-24 ENCOUNTER — Ambulatory Visit
Admission: RE | Admit: 2023-08-24 | Discharge: 2023-08-24 | Disposition: A | Discharge status: Home / Self Care | Payer: Medicare Other | Source: Ambulatory Visit | Attending: Radiation Oncology | Admitting: Radiation Oncology

## 2023-08-24 DIAGNOSIS — C541 Malignant neoplasm of endometrium: Secondary | ICD-10-CM | POA: Diagnosis not present

## 2023-08-25 DIAGNOSIS — C541 Malignant neoplasm of endometrium: Secondary | ICD-10-CM | POA: Diagnosis not present

## 2023-08-31 ENCOUNTER — Inpatient Hospital Stay (HOSPITAL_BASED_OUTPATIENT_CLINIC_OR_DEPARTMENT_OTHER): Payer: Medicare Other | Attending: Obstetrics and Gynecology | Admitting: Obstetrics and Gynecology

## 2023-08-31 ENCOUNTER — Encounter: Payer: Self-pay | Admitting: Obstetrics and Gynecology

## 2023-08-31 VITALS — BP 143/75 | HR 83 | Temp 97.6°F | Resp 20 | Wt 167.5 lb

## 2023-08-31 DIAGNOSIS — C541 Malignant neoplasm of endometrium: Secondary | ICD-10-CM | POA: Insufficient documentation

## 2023-08-31 DIAGNOSIS — Z90722 Acquired absence of ovaries, bilateral: Secondary | ICD-10-CM

## 2023-08-31 DIAGNOSIS — Z90710 Acquired absence of both cervix and uterus: Secondary | ICD-10-CM

## 2023-08-31 NOTE — Progress Notes (Addendum)
Gynecologic Oncology Consult Visit   Referring Provider: Dr. Dalbert Garnet  Chief Concern: Endometrial cancer, post op visit  Subjective:  Barbara Mooney is a 89 y.o. female who is seen in consultation from Dr. Dalbert Garnet for grade 2 endometrial cancer.   Returns today for cuff check.   Gyn Oncology History Patient experienced PMB which prompted ultrasound.   - Uterus: 8.39 x 4.41 x 6.02 cm  - Ovaries: bilaterally wnl  - Other: 27 mm endometrial lining, 2.8 cm posterior fibroid. The uterus had a maximal endometrial thickness of 27 mm. The endometrial contour appeared irregular and generally thickened. Endometrial findings were unable to be fully examined due to the large, solid mass .   05/25/23- Pap NILM  She underwent endometrial biopsy.  WELL DIFFERENTIATED ENDOMETRIOID CARCINOMA (FIGO I).  - Loss of MLH1 and PMS2. MSH2 and MSH6 intact.    She had possible rectal prolapse on exam and was referred to GI. She takes Eliquis for history of a fib.   She has a history of Breast DCIS and Non Hodgkins Lymphoma.   # Right lower inner biopsy- high grade DCIS, lobular neoplasia, calcifications. She underwent lumpectomy with high grade dcis, PLCIS, lobular neoplasia. Followed by radiation x 3 weeks with boost.    # Right LE Non-hodgkin follicular lymphoma s/p surgery at [facility] and RT with Dr Rushie Chestnut in 2018.  She has had radiation to the right lower extremity.  Genetic Testing: 08/26/21- Invitae Multi-cancer 84 gene panel was negative.   07/27/23 TLH, BSO, SLN mapping and biopsies. Pathology showed 8/10 mm invasion with LVSI and cervical stromal involvement. Bilateral SLNs with ITCs.  Washings negative.   No new complaints today.  Incisions healing slowly.   Diagnosis 1. Lymph node, sentinel, biopsy, Left external iliac - ONE LYMPH NODE WITH ISOLATED TUMOR CELLS. - PANCYTOKERATIN STAIN CONFIRMS. 2. Lymph node, sentinel, biopsy, Right external iliac - ONE LYMPH NODE WITH ISOLATED TUMOR  CELLS. - PANCYTOKERATIN STAIN CONFIRMS. 3. Uterus +/- tubes/ovaries, neoplastic - ENDOMETRIOID CARCINOMA WITH MICROCYSTIC, ELONGATED AND FRAGMENTED (MELF) PATTERN OF INVASION. - CARCINOMA INVADES STROMAL CONNECTIVE TISSUE OF CERVIX. - SEE CANCER SUMMARY BELOW. - BACKGROUND ATROPHIC ENDOMETRIUM WITH ATYPICAL ENDOMETRIAL HYPERPLASIA/EIN. - MYOMETRIUM WITH LEIOMYOMATA, LARGEST MEASURING 1.2 CM. - RIGHT PARATUBAL CYST. - BILATERAL FALLOPIAN TUBES WITH FIMBRIATED END. - BILATERAL OVARIES WITH NO PATHOLOGIC ABNORMALITY. Microscopic Comment 3. CASE SUMMARY: (ENDOMETRIUM) Standard(s): AJCC-UICC 8, FIGO Cancer Report 2023 SPECIMEN Procedure: Total hysterectomy and bilateral salpingo-oophorectomy TUMOR Histologic Type: Endometrioid carcinoma, NOS Histologic Grade: FIGO grade 2 Myometrial Invasion: Present Depth of invasion (millimeters): 8 mm Myometrial thickness (millimeters): 10 mm Percentage of myometrial invasion: 80%   Microscopic Comment(continued) Uterine Serosa Involvement: Not identified Lower Uterine Segment Involvement: Present, myoinvasive Cervical Stromal Involvement: Present Other Tissue/Organ Involvement: Not applicable Lymphatic and/or Vascular Invasion: Present  MLH1: LOSS OF NUCLEAR EXPRESSION MSH2: Preserved nuclear expression MSH6: LOSS OF NUCLEAR EXPRESSION PMS2: LOSS OF NUCLEAR EXPRESSION  TP53 WT  Last colonoscopy in 05/2014- results not available Mammogram 02/25/23- Bi-Rads category 2: benign  Problem List: Patient Active Problem List   Diagnosis Date Noted   Endometrial cancer, grade I (HCC) 07/27/2023   Counseling and coordination of care 08/26/2021   Family history of breast cancer 07/21/2021   Family history of ovarian cancer 07/21/2021   Family history of colon cancer 07/21/2021   Family history of Hodgkin's disease 07/21/2021   Hyperlipemia, mixed 07/21/2021   Osteopenia 07/21/2021   Acquired trigger finger 06/11/2021   Ductal carcinoma in situ  (DCIS) of  right breast 03/05/2021   Bradycardia 02/01/2019   Closed Colles' fracture 09/23/2018   Follicular non-Hodgkin's lymphoma of bone (HCC) 11/16/2017   Tibial mass 10/13/2017   Malignant neoplasm of right tibia (HCC) 10/07/2017   Type 2 diabetes mellitus with complication, with long-term current use of insulin (HCC) 08/07/2017   Complete tear of right rotator cuff 05/16/2017   Rotator cuff tendinitis, right 05/16/2017   Encounter for anticoagulation discussion and counseling 02/08/2017   Leg swelling 02/08/2017   Non-Hodgkin lymphoma (HCC) 2018   Unsteady gait    Anemia    Paroxysmal atrial fibrillation (HCC)    Essential hypertension    Sepsis (HCC) 10/28/2016   Degenerative tear of lateral meniscus, left 08/09/2016   Primary osteoarthritis of left knee 08/09/2016   Varicose veins of both legs with edema 03/17/2016   Type 2 diabetes mellitus with stage 3 chronic kidney disease, without long-term current use of insulin (HCC) 01/27/2016   Mixed incontinence 06/11/2015   External hemorrhoid 03/11/2015   Gastroesophageal reflux disease without esophagitis 03/11/2015   Onychomycosis of toenail 03/11/2015   Knee strain, right, subsequent encounter 02/28/2015   Primary osteoarthritis of right knee 02/28/2015   Vitamin B 12 deficiency 06/13/2014   Multinodular goiter (nontoxic) 09/07/2011    Past Medical History: 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   a.) stage 0 (high grade cTis (DCIS), cN0, cM0) with comedonecrosis -- > s/p lumpectomy + XRT   Endometrial cancer (HCC) 05/25/2023   a.) pathology (+) for well differentiated endometroid carcinoma (FIGO 1)   Family history of breast cancer    Family history of colon cancer     Family history of Hodgkin's disease    Family history of ovarian cancer    Follicular non-Hodgkin's lymphoma (HCC) 2018   a.) 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 RVSP 37.52mmHg. Mod dil LA. Mild to mod MR/TR. Mild AoV sclerosis; c.) TTE 07/08/2023: EF 60-65%, mild RVE, sev BAE, mod MAC, mild-mod TR, mild MR, RVSP 30.5   History of hiatal hernia    HLD (hyperlipidemia)    HOH (hard of hearing)    Hypertension    Hypokalemia    Hypothyroidism    Leg pain    a. 08/2020 ABIs: R 1.19, L 0.97.   Mixed incontinence    MRSA infection    a.) remote history; posterior torso   Multiple thyroid nodules    On apixaban therapy    Peripheral edema    Permanent atrial fibrillation (HCC) 10/2016   a.) CHA2DS2VASc = 6 (age x2, sex, HTN, vascular disease history, T2DM);  b.) s/p DCCV 01/20/2017 (150 J x 1) --> failed; c.) rate/rhythm maintained on oral diltiazem; chronically anticoagulated with apixaban   RBBB (right bundle branch block)    Recurrent UTI (urinary tract infection)    Sepsis (HCC)    Unsteady gait  Varicose veins of both lower extremities    Vitamin B12 deficiency     Past Surgical History: Past Surgical History:  Procedure Laterality Date   BONE BIOPSY Right 2018   tibia   BREAST BIOPSY Right 02/26/2021   ffirm bx-"X" clip-HIGH-GRADE DUCTAL CARCINOMA IN SITU   BREAST LUMPECTOMY Right 03/09/2021   Procedure: BREAST LUMPECTOMY;  Surgeon: Earline Mayotte, MD;  Location: [facility];  Service: General;  Laterality: Right;   CARDIOVERSION N/A 01/20/2017   Procedure: CARDIOVERSION;  Surgeon: Antonieta Iba, MD;  Location: [facility];  Service: Cardiovascular;   Laterality: N/A;   CATARACT EXTRACTION W/PHACO Right 02/15/2018   Procedure: CATARACT EXTRACTION PHACO AND INTRAOCULAR LENS PLACEMENT (IOC);  Surgeon: Galen Manila, MD;  Location: [facility];  Service: Ophthalmology;  Laterality: Right;  [place] 01:22.8 AP% 18.9 CDE 15.62 Fluid Pack Lot # [id number]   CATARACT EXTRACTION W/PHACO Left 03/08/2018   Procedure: CATARACT EXTRACTION PHACO AND INTRAOCULAR LENS PLACEMENT (IOC);  Surgeon: Galen Manila, MD;  Location: [facility];  Service: Ophthalmology;  Laterality: Left;  [place] 01:05 AP% 15.8 CDE 10.28 Fluid pak lot # [id number]   COLONOSCOPY     TOTAL LAPAROSCOPIC HYSTERECTOMY WITH BILATERAL SALPINGO OOPHORECTOMY N/A 07/27/2023   Procedure: TOTAL LAPAROSCOPIC HYSTERECTOMY WITH BILATERAL SALPINGO OOPHORECTOMY, SENTINEL LYMPH NODE INJECTION AND MAPPING, NODE DISSECTION;  Surgeon: [name], MD;  Location: [facility];  Service: Gynecology;  Laterality: N/A;    Past Gynecologic History:  Per HPI  OB History:  OB History  Gravida Para Term Preterm AB Living  2         2  SAB IAB Ectopic Multiple Live Births               # Outcome Date GA Lbr Len/2nd Weight Sex Type Anes PTL Lv  2 Gravida           1 Gravida             Family History: Family History  Problem Relation Age of Onset   Breast cancer Sister 57   Stroke Sister    Atrial fibrillation Sister    Heart disease Brother    Ovarian cancer Maternal Aunt    Colon cancer Maternal Aunt    Kidney cancer Neg Hx    Bladder Cancer Neg Hx     Social History: Social History   Socioeconomic History   Marital status: Widowed    Spouse name: Not on file   Number of children: Not on file   Years of education: Not on file   Highest education level: Not on file  Occupational History   Not on file  Tobacco Use   Smoking status: Never   Smokeless tobacco: Never  Vaping Use   Vaping status: Never Used  Substance and Sexual Activity   Alcohol use: No   Drug use: No   Sexual  activity: Not Currently  Other Topics Concern   Not on file  Social History Narrative   Lives by self; son lives about 2 miles. Never smoked; no alcohol. Used to [employer]: Low Risk  (07/20/2023)   Received from [facility]   Overall Financial Resource Strain (CARDIA)    Difficulty of Paying Living Expenses: Not hard at all  Food Insecurity: No Food Insecurity (07/27/2023)   Hunger Vital Sign    Worried About Running Out of Food in the Last Year: Never true    Ran Out  of Food in the Last Year: Never true  Transportation Needs: No Transportation Needs (07/27/2023)   PRAPARE - Administrator, Civil Service (Medical): No    Lack of Transportation (Non-Medical): No  Physical Activity: Not on file  Stress: Not on file  Social Connections: Not on file  Intimate Partner Violence: Not At Risk (07/27/2023)   Humiliation, Afraid, Rape, and Kick questionnaire    Fear of Current or Ex-Partner: No    Emotionally Abused: No    Physically Abused: No    Sexually Abused: No    Allergies: Allergies  Allergen Reactions   Nitrofurantoin Diarrhea, Nausea Only and Other (See Comments)   Sulfa Antibiotics Nausea Only and Other (See Comments)    Current Medications: Current Outpatient Medications  Medication Sig Dispense Refill   acetaminophen (TYLENOL) 500 MG tablet Take 500 mg by mouth 3 (three) times daily.     CRANBERRY PO Take 1 capsule by mouth 2 (two) times daily.      cyanocobalamin (,VITAMIN B-12,) 1000 MCG/ML injection Inject 1,000 mcg into the muscle every 30 (thirty) days.      diltiazem (CARDIZEM CD) 180 MG 24 hr capsule Take 1 capsule (180 mg total) by mouth 2 (two) times daily. 180 capsule 3   docusate sodium (COLACE) 100 MG capsule Take 1 capsule (100 mg total) by mouth 2 (two) times daily. To keep stools soft 30 capsule 0   ELIQUIS 5 MG TABS tablet TAKE 1 TABLET BY MOUTH TWICE A DAY 180 tablet 1    furosemide (LASIX) 20 MG tablet Take 1 tablet (20 mg total) by mouth daily as needed for fluid or edema (SOB). 90 tablet 3   LACTOBACILLUS PO Take 1 tablet by mouth daily at 6 (six) AM.     metFORMIN (GLUCOPHAGE) 500 MG tablet Take 500 mg by mouth 2 (two) times daily with a meal.     olmesartan (BENICAR) 40 MG tablet TAKE 1 TABLET BY MOUTH EVERY DAY (Patient taking differently: Take 40 mg by mouth every morning.) 90 tablet 0   omeprazole (PRILOSEC) 20 MG capsule Take 1 capsule (20 mg total) by mouth daily. (Patient taking differently: Take 20 mg by mouth every morning.) 30 capsule 6   ondansetron (ZOFRAN) 4 MG tablet Take 4 mg by mouth every 8 (eight) hours as needed.     oxyCODONE (OXY IR/ROXICODONE) 5 MG immediate release tablet Take 1 tablet (5 mg total) by mouth every 4 (four) hours as needed for severe pain. 6 tablet 0   Trolamine Salicylate (BLUE-EMU HEMP EX) Apply 1 application topically daily as needed (Knee pain).     No current facility-administered medications for this visit.   Review of Systems  General: negative for fevers, changes in weight or night sweats Skin: negative for changes in moles or sores or rash Eyes: negative for changes in vision HEENT: negative for change in hearing, tinnitus, voice changes Pulmonary: negative for dyspnea, orthopnea, productive cough, wheezing Cardiac: negative for palpitations, pain Gastrointestinal: negative for nausea, vomiting, constipation, diarrhea, hematemesis, hematochezia Genitourinary/Sexual: negative for dysuria, retention, hematuria, incontinence Musculoskeletal: pain in the legs and weakness Hematology: negative for easy bruising, abnormal bleeding Neurologic/Psych: negative for headaches, seizures, paralysis, numbness  Objective:  Physical Examination:  BP (!) 143/75   Pulse 83   Temp 97.6 F (36.4 C)   Resp 20   Wt 167 lb 8 oz (76 kg)   SpO2 100%   BMI 27.04 kg/m    ECOG Performance Status: 1 -  Symptomatic but  completely ambulatory  GENERAL: Patient is an elderly appearing in no acute distress HEENT:  Sclera clear. Anicteric NODES:  Negative axillary, supraclavicular, inguinal lymph node survery LUNGS:  Clear to auscultation bilaterally.   HEART:  Regular rate and rhythm.  ABDOMEN:  Soft, nontender.  No hernias, LS incisions intact, healing slowly with a bit of redness. No masses or ascites EXTREMITIES:  No peripheral edema. Atraumatic. No cyanosis SKIN:  Clear with no obvious rashes or skin changes.  NEURO:  Nonfocal. Well oriented.  Appropriate affect.  Pelvic EGBUS/Vagina: normal, cuff healing well. Bimanual: normal.  Lab Review 01/28/2023 Thyroid Stimulating Hormone (TSH) 0.450-5.330 uIU/ml uIU/mL 2.040   HbA1c, CBC, CMP, TSH ordered  Radiologic Imaging: As per HPI    Assessment:  Barbara Mooney is a 89 y.o. female diagnosed with PMB diagnosed 6/24 with Stage IIIC1 grade 1 endometrioid endometrial cancer with deficient mismatch repair protein expression (Loss of MLH1 and PMS2) on endometrial biopsy.  07/27/23 underwent TLH, BSO, SLN mapping and biopsies. Pathology showed Stage IB grade 2 adenocarcinoma with 8/10 mm invasion with LVSI and cervical stromal involvement. Bilateral SLNs with ITCs.  Washings negative.   Personal and family history of malignancy.  Negative Inv[facility]ae 84 gene germline extended panel test 8/24.   MLH1: LOSS OF NUCLEAR EXPRESSION MSH2: Preserved nuclear expression MSH6: LOSS OF NUCLEAR EXPRESSION PMS2: LOSS OF NUCLEAR EXPRESSION  TP53 WT  Type 2 diabetes mellitus  Hypothyroidism  A-fib on chronic long-term anticoagulation  Medical co-morbidities complicating care: diabetes. Non-Hodgkin lymphoma and breast cancer (DCIS) status post radiation therapy Plan:   Problem List Items Addressed This Visit       Genitourinary   Endometrial cancer, grade I (HCC) - Primary   Discussed with patient and daughter.  In view of grade 2 cancer with deep  invasion, cervical stromal involvement, ITCs and LVSI recommend external pelvic radiation and she will start next next week at [facility]. She will see Dr Marinell Blight at [facility] for consult regarding whether vaginal brachytherapy would be an option to further reduce vaginal recurrence.  Discussed that we would not recommend adjuvant chemotherapy.    MLH1 promoter methylation will be ordered.  May want to get NGS test to look for somatic MMR gene mutation in view of negative germline testing.   We will see her back in 3 months for follow up.    [name], MD  CC:  Dr. Dalbert Garnet

## 2023-09-02 ENCOUNTER — Other Ambulatory Visit: Payer: Self-pay | Admitting: *Deleted

## 2023-09-02 DIAGNOSIS — C541 Malignant neoplasm of endometrium: Secondary | ICD-10-CM

## 2023-09-05 ENCOUNTER — Ambulatory Visit: Admission: RE | Admit: 2023-09-05 | Payer: Medicare Other | Source: Ambulatory Visit

## 2023-09-06 ENCOUNTER — Other Ambulatory Visit: Payer: Self-pay

## 2023-09-06 ENCOUNTER — Ambulatory Visit
Admission: RE | Admit: 2023-09-06 | Discharge: 2023-09-06 | Disposition: A | Discharge status: Home / Self Care | Payer: Medicare Other | Source: Ambulatory Visit | Attending: Radiation Oncology | Admitting: Radiation Oncology

## 2023-09-06 DIAGNOSIS — C541 Malignant neoplasm of endometrium: Secondary | ICD-10-CM | POA: Diagnosis not present

## 2023-09-06 LAB — RAD ONC ARIA SESSION SUMMARY
Course Elapsed Days: 0
Plan Fractions Treated to Date: 1
Plan Prescribed Dose Per Fraction: 1.8 Gy
Plan Total Fractions Prescribed: 25
Plan Total Prescribed Dose: 45 Gy
Reference Point Dosage Given to Date: 1.8 Gy
Reference Point Session Dosage Given: 1.8 Gy
Session Number: 1

## 2023-09-07 ENCOUNTER — Ambulatory Visit
Admission: RE | Admit: 2023-09-07 | Discharge: 2023-09-07 | Disposition: A | Discharge status: Home / Self Care | Payer: Medicare Other | Source: Ambulatory Visit | Attending: Radiation Oncology | Admitting: Radiation Oncology

## 2023-09-07 ENCOUNTER — Other Ambulatory Visit: Payer: Self-pay

## 2023-09-07 DIAGNOSIS — C541 Malignant neoplasm of endometrium: Secondary | ICD-10-CM | POA: Diagnosis not present

## 2023-09-07 LAB — RAD ONC ARIA SESSION SUMMARY
Course Elapsed Days: 1
Plan Fractions Treated to Date: 2
Plan Prescribed Dose Per Fraction: 1.8 Gy
Plan Total Fractions Prescribed: 25
Plan Total Prescribed Dose: 45 Gy
Reference Point Dosage Given to Date: 3.6 Gy
Reference Point Session Dosage Given: 1.8 Gy
Session Number: 2

## 2023-09-08 ENCOUNTER — Inpatient Hospital Stay: Payer: Medicare Other

## 2023-09-08 ENCOUNTER — Other Ambulatory Visit: Payer: Self-pay

## 2023-09-08 ENCOUNTER — Other Ambulatory Visit: Payer: Self-pay | Admitting: *Deleted

## 2023-09-08 ENCOUNTER — Ambulatory Visit
Admission: RE | Admit: 2023-09-08 | Discharge: 2023-09-08 | Disposition: A | Discharge status: Home / Self Care | Payer: Medicare Other | Source: Ambulatory Visit | Attending: Radiation Oncology | Admitting: Radiation Oncology

## 2023-09-08 DIAGNOSIS — C541 Malignant neoplasm of endometrium: Secondary | ICD-10-CM | POA: Diagnosis not present

## 2023-09-08 LAB — RAD ONC ARIA SESSION SUMMARY
Course Elapsed Days: 2
Plan Fractions Treated to Date: 3
Plan Prescribed Dose Per Fraction: 1.8 Gy
Plan Total Fractions Prescribed: 25
Plan Total Prescribed Dose: 45 Gy
Reference Point Dosage Given to Date: 5.4 Gy
Reference Point Session Dosage Given: 1.8 Gy
Session Number: 3

## 2023-09-08 LAB — CBC (CANCER CENTER ONLY)
HCT: 40.3 % (ref 36.0–46.0)
Hemoglobin: 12.6 g/dL (ref 12.0–15.0)
MCH: 27.7 pg (ref 26.0–34.0)
MCHC: 31.3 g/dL (ref 30.0–36.0)
MCV: 88.6 fL (ref 80.0–100.0)
Platelet Count: 281 10*3/uL (ref 150–400)
RBC: 4.55 MIL/uL (ref 3.87–5.11)
RDW: 14.6 % (ref 11.5–15.5)
WBC Count: 7.3 10*3/uL (ref 4.0–10.5)
nRBC: 0 % (ref 0.0–0.2)

## 2023-09-08 MED ORDER — ONDANSETRON HCL 8 MG PO TABS: 8.0000 mg | ORAL_TABLET | Freq: Three times a day (TID) | ORAL | 0 refills | Status: DC | PRN

## 2023-09-09 ENCOUNTER — Ambulatory Visit
Admission: RE | Admit: 2023-09-09 | Discharge: 2023-09-09 | Disposition: A | Discharge status: Home / Self Care | Payer: Medicare Other | Source: Ambulatory Visit | Attending: Radiation Oncology | Admitting: Radiation Oncology

## 2023-09-09 ENCOUNTER — Other Ambulatory Visit: Payer: Self-pay

## 2023-09-09 DIAGNOSIS — C541 Malignant neoplasm of endometrium: Secondary | ICD-10-CM | POA: Diagnosis not present

## 2023-09-09 LAB — RAD ONC ARIA SESSION SUMMARY
Course Elapsed Days: 3
Plan Fractions Treated to Date: 4
Plan Prescribed Dose Per Fraction: 1.8 Gy
Plan Total Fractions Prescribed: 25
Plan Total Prescribed Dose: 45 Gy
Reference Point Dosage Given to Date: 7.2 Gy
Reference Point Session Dosage Given: 1.8 Gy
Session Number: 4

## 2023-09-12 ENCOUNTER — Other Ambulatory Visit: Payer: Self-pay

## 2023-09-12 ENCOUNTER — Ambulatory Visit
Admission: RE | Admit: 2023-09-12 | Discharge: 2023-09-12 | Disposition: A | Discharge status: Home / Self Care | Payer: Medicare Other | Source: Ambulatory Visit | Attending: Radiation Oncology | Admitting: Radiation Oncology

## 2023-09-12 DIAGNOSIS — C541 Malignant neoplasm of endometrium: Secondary | ICD-10-CM | POA: Diagnosis not present

## 2023-09-12 LAB — RAD ONC ARIA SESSION SUMMARY
Course Elapsed Days: 6
Plan Fractions Treated to Date: 5
Plan Prescribed Dose Per Fraction: 1.8 Gy
Plan Total Fractions Prescribed: 25
Plan Total Prescribed Dose: 45 Gy
Reference Point Dosage Given to Date: 9 Gy
Reference Point Session Dosage Given: 1.8 Gy
Session Number: 5

## 2023-09-13 ENCOUNTER — Ambulatory Visit
Admission: RE | Admit: 2023-09-13 | Discharge: 2023-09-13 | Disposition: A | Discharge status: Home / Self Care | Payer: Medicare Other | Source: Ambulatory Visit | Attending: Radiation Oncology | Admitting: Radiation Oncology

## 2023-09-13 ENCOUNTER — Other Ambulatory Visit: Payer: Self-pay

## 2023-09-13 DIAGNOSIS — C541 Malignant neoplasm of endometrium: Secondary | ICD-10-CM | POA: Insufficient documentation

## 2023-09-13 LAB — RAD ONC ARIA SESSION SUMMARY
Course Elapsed Days: 7
Plan Fractions Treated to Date: 6
Plan Prescribed Dose Per Fraction: 1.8 Gy
Plan Total Fractions Prescribed: 25
Plan Total Prescribed Dose: 45 Gy
Reference Point Dosage Given to Date: 10.8 Gy
Reference Point Session Dosage Given: 1.8 Gy
Session Number: 6

## 2023-09-14 ENCOUNTER — Other Ambulatory Visit: Payer: Self-pay

## 2023-09-14 ENCOUNTER — Ambulatory Visit
Admission: RE | Admit: 2023-09-14 | Discharge: 2023-09-14 | Disposition: A | Discharge status: Home / Self Care | Payer: Medicare Other | Source: Ambulatory Visit | Attending: Radiation Oncology | Admitting: Radiation Oncology

## 2023-09-14 DIAGNOSIS — C541 Malignant neoplasm of endometrium: Secondary | ICD-10-CM | POA: Diagnosis not present

## 2023-09-14 LAB — RAD ONC ARIA SESSION SUMMARY
Course Elapsed Days: 8
Plan Fractions Treated to Date: 7
Plan Prescribed Dose Per Fraction: 1.8 Gy
Plan Total Fractions Prescribed: 25
Plan Total Prescribed Dose: 45 Gy
Reference Point Dosage Given to Date: 12.6 Gy
Reference Point Session Dosage Given: 1.8 Gy
Session Number: 7

## 2023-09-15 ENCOUNTER — Inpatient Hospital Stay: Payer: Medicare Other

## 2023-09-15 ENCOUNTER — Ambulatory Visit
Admission: RE | Admit: 2023-09-15 | Discharge: 2023-09-15 | Disposition: A | Discharge status: Home / Self Care | Payer: Medicare Other | Source: Ambulatory Visit | Attending: Radiation Oncology | Admitting: Radiation Oncology

## 2023-09-15 ENCOUNTER — Other Ambulatory Visit: Payer: Self-pay

## 2023-09-15 DIAGNOSIS — C541 Malignant neoplasm of endometrium: Secondary | ICD-10-CM | POA: Insufficient documentation

## 2023-09-15 LAB — RAD ONC ARIA SESSION SUMMARY
Course Elapsed Days: 9
Plan Fractions Treated to Date: 8
Plan Prescribed Dose Per Fraction: 1.8 Gy
Plan Total Fractions Prescribed: 25
Plan Total Prescribed Dose: 45 Gy
Reference Point Dosage Given to Date: 14.4 Gy
Reference Point Session Dosage Given: 1.8 Gy
Session Number: 8

## 2023-09-15 LAB — CBC (CANCER CENTER ONLY)
HCT: 37 % (ref 36.0–46.0)
Hemoglobin: 11.5 g/dL — ABNORMAL LOW (ref 12.0–15.0)
MCH: 28 pg (ref 26.0–34.0)
MCHC: 31.1 g/dL (ref 30.0–36.0)
MCV: 90 fL (ref 80.0–100.0)
Platelet Count: 251 10*3/uL (ref 150–400)
RBC: 4.11 MIL/uL (ref 3.87–5.11)
RDW: 15 % (ref 11.5–15.5)
WBC Count: 4 10*3/uL (ref 4.0–10.5)
nRBC: 0 % (ref 0.0–0.2)

## 2023-09-16 ENCOUNTER — Other Ambulatory Visit: Payer: Self-pay

## 2023-09-16 ENCOUNTER — Ambulatory Visit
Admission: RE | Admit: 2023-09-16 | Discharge: 2023-09-16 | Disposition: A | Discharge status: Home / Self Care | Payer: Medicare Other | Source: Ambulatory Visit | Attending: Radiation Oncology | Admitting: Radiation Oncology

## 2023-09-16 DIAGNOSIS — C541 Malignant neoplasm of endometrium: Secondary | ICD-10-CM | POA: Diagnosis not present

## 2023-09-16 LAB — RAD ONC ARIA SESSION SUMMARY
Course Elapsed Days: 10
Plan Fractions Treated to Date: 9
Plan Prescribed Dose Per Fraction: 1.8 Gy
Plan Total Fractions Prescribed: 25
Plan Total Prescribed Dose: 45 Gy
Reference Point Dosage Given to Date: 16.2 Gy
Reference Point Session Dosage Given: 1.8 Gy
Session Number: 9

## 2023-09-19 ENCOUNTER — Ambulatory Visit: Payer: Medicare Other

## 2023-09-19 ENCOUNTER — Telehealth: Payer: Self-pay | Admitting: *Deleted

## 2023-09-19 NOTE — Telephone Encounter (Signed)
Called daughter to advise or infection control restriction for positive Covid test. Daughter verbalized understanding patient will return to clinic on Friday and wear a mask.

## 2023-09-20 ENCOUNTER — Ambulatory Visit: Payer: Medicare Other | Admitting: Cardiovascular Disease

## 2023-09-20 ENCOUNTER — Ambulatory Visit: Payer: Medicare Other

## 2023-09-21 ENCOUNTER — Ambulatory Visit: Payer: Medicare Other

## 2023-09-21 ENCOUNTER — Telehealth: Payer: Self-pay | Admitting: Cardiovascular Disease

## 2023-09-21 NOTE — Telephone Encounter (Signed)
Daughter calling in because patient has covid. And calling to see what she can take other then tylenol to break her fever. Please advise

## 2023-09-21 NOTE — Telephone Encounter (Signed)
Pt's daughter, Marylu Lund (DPR ok), called back, reports low grade fever between 100-101 started today, counseled not to use NSAIDs and max tylenol 1000 mg q 6 hours, recheck temp after 1-1.5 hours, denies N/V or SOB  Cautioned to for reduced compensatory measures of 89 y/o pt and to elevate care to ED or possible Urgent Care if fever is unresponsive and if N/V persists past 12 hours  Marylu Lund, reports COVID positive results on Sunday 09/18/23, and flu like s/sx that started yesterday: fever and diarrhea  Counseled for bland diet with low fats and greases to slow motility and encourage hydration with electrolytes

## 2023-09-22 ENCOUNTER — Ambulatory Visit: Payer: Medicare Other

## 2023-09-22 ENCOUNTER — Inpatient Hospital Stay: Payer: Medicare Other

## 2023-09-23 ENCOUNTER — Other Ambulatory Visit: Payer: Self-pay

## 2023-09-23 ENCOUNTER — Ambulatory Visit
Admission: RE | Admit: 2023-09-23 | Discharge: 2023-09-23 | Disposition: A | Discharge status: Home / Self Care | Payer: Medicare Other | Source: Ambulatory Visit | Attending: Radiation Oncology | Admitting: Radiation Oncology

## 2023-09-23 DIAGNOSIS — C541 Malignant neoplasm of endometrium: Secondary | ICD-10-CM | POA: Diagnosis not present

## 2023-09-23 LAB — RAD ONC ARIA SESSION SUMMARY
Course Elapsed Days: 17
Plan Fractions Treated to Date: 10
Plan Prescribed Dose Per Fraction: 1.8 Gy
Plan Total Fractions Prescribed: 25
Plan Total Prescribed Dose: 45 Gy
Reference Point Dosage Given to Date: 18 Gy
Reference Point Session Dosage Given: 1.8 Gy
Session Number: 10

## 2023-09-26 ENCOUNTER — Other Ambulatory Visit: Payer: Self-pay

## 2023-09-26 ENCOUNTER — Ambulatory Visit
Admission: RE | Admit: 2023-09-26 | Discharge: 2023-09-26 | Disposition: A | Discharge status: Home / Self Care | Payer: Medicare Other | Source: Ambulatory Visit | Attending: Radiation Oncology | Admitting: Radiation Oncology

## 2023-09-26 DIAGNOSIS — C541 Malignant neoplasm of endometrium: Secondary | ICD-10-CM | POA: Diagnosis not present

## 2023-09-26 LAB — RAD ONC ARIA SESSION SUMMARY
Course Elapsed Days: 20
Plan Fractions Treated to Date: 11
Plan Prescribed Dose Per Fraction: 1.8 Gy
Plan Total Fractions Prescribed: 25
Plan Total Prescribed Dose: 45 Gy
Reference Point Dosage Given to Date: 19.8 Gy
Reference Point Session Dosage Given: 1.8 Gy
Session Number: 11

## 2023-09-27 ENCOUNTER — Ambulatory Visit
Admission: RE | Admit: 2023-09-27 | Discharge: 2023-09-27 | Disposition: A | Discharge status: Home / Self Care | Payer: Medicare Other | Source: Ambulatory Visit | Attending: Radiation Oncology | Admitting: Radiation Oncology

## 2023-09-27 ENCOUNTER — Other Ambulatory Visit: Payer: Self-pay

## 2023-09-27 DIAGNOSIS — C541 Malignant neoplasm of endometrium: Secondary | ICD-10-CM | POA: Diagnosis not present

## 2023-09-27 LAB — RAD ONC ARIA SESSION SUMMARY
Course Elapsed Days: 21
Plan Fractions Treated to Date: 12
Plan Prescribed Dose Per Fraction: 1.8 Gy
Plan Total Fractions Prescribed: 25
Plan Total Prescribed Dose: 45 Gy
Reference Point Dosage Given to Date: 21.6 Gy
Reference Point Session Dosage Given: 1.8 Gy
Session Number: 12

## 2023-09-28 ENCOUNTER — Ambulatory Visit
Admission: RE | Admit: 2023-09-28 | Discharge: 2023-09-28 | Disposition: A | Discharge status: Home / Self Care | Payer: Medicare Other | Source: Ambulatory Visit | Attending: Radiation Oncology | Admitting: Radiation Oncology

## 2023-09-28 ENCOUNTER — Other Ambulatory Visit: Payer: Self-pay

## 2023-09-28 DIAGNOSIS — C541 Malignant neoplasm of endometrium: Secondary | ICD-10-CM | POA: Diagnosis not present

## 2023-09-28 LAB — RAD ONC ARIA SESSION SUMMARY
Course Elapsed Days: 22
Plan Fractions Treated to Date: 13
Plan Prescribed Dose Per Fraction: 1.8 Gy
Plan Total Fractions Prescribed: 25
Plan Total Prescribed Dose: 45 Gy
Reference Point Dosage Given to Date: 23.4 Gy
Reference Point Session Dosage Given: 1.8 Gy
Session Number: 13

## 2023-09-29 ENCOUNTER — Inpatient Hospital Stay: Payer: Medicare Other

## 2023-09-29 ENCOUNTER — Telehealth: Payer: Self-pay | Admitting: *Deleted

## 2023-09-29 ENCOUNTER — Other Ambulatory Visit: Payer: Self-pay

## 2023-09-29 ENCOUNTER — Ambulatory Visit
Admission: RE | Admit: 2023-09-29 | Discharge: 2023-09-29 | Disposition: A | Discharge status: Home / Self Care | Payer: Medicare Other | Source: Ambulatory Visit | Attending: Radiation Oncology | Admitting: Radiation Oncology

## 2023-09-29 DIAGNOSIS — C541 Malignant neoplasm of endometrium: Secondary | ICD-10-CM | POA: Diagnosis not present

## 2023-09-29 LAB — RAD ONC ARIA SESSION SUMMARY
Course Elapsed Days: 23
Plan Fractions Treated to Date: 14
Plan Prescribed Dose Per Fraction: 1.8 Gy
Plan Total Fractions Prescribed: 25
Plan Total Prescribed Dose: 45 Gy
Reference Point Dosage Given to Date: 25.2 Gy
Reference Point Session Dosage Given: 1.8 Gy
Session Number: 14

## 2023-09-29 LAB — CBC (CANCER CENTER ONLY)
HCT: 38.3 % (ref 36.0–46.0)
Hemoglobin: 12.3 g/dL (ref 12.0–15.0)
MCH: 28 pg (ref 26.0–34.0)
MCHC: 32.1 g/dL (ref 30.0–36.0)
MCV: 87.2 fL (ref 80.0–100.0)
Platelet Count: 251 10*3/uL (ref 150–400)
RBC: 4.39 MIL/uL (ref 3.87–5.11)
RDW: 15.2 % (ref 11.5–15.5)
WBC Count: 5.4 10*3/uL (ref 4.0–10.5)
nRBC: 0 % (ref 0.0–0.2)

## 2023-09-29 NOTE — Telephone Encounter (Signed)
Daughter called asking about the referral to [facility] and is asking if she is supposed to call for it or wait for them to call her. Per Dr Annye Rusk note. She was to be referred to Dr Marinell Blight at [facility] for Vaginal Brachytherapy

## 2023-09-30 ENCOUNTER — Other Ambulatory Visit: Payer: Self-pay

## 2023-09-30 ENCOUNTER — Ambulatory Visit
Admission: RE | Admit: 2023-09-30 | Discharge: 2023-09-30 | Disposition: A | Discharge status: Home / Self Care | Payer: Medicare Other | Source: Ambulatory Visit | Attending: Radiation Oncology | Admitting: Radiation Oncology

## 2023-09-30 DIAGNOSIS — C541 Malignant neoplasm of endometrium: Secondary | ICD-10-CM | POA: Diagnosis not present

## 2023-09-30 LAB — RAD ONC ARIA SESSION SUMMARY
Course Elapsed Days: 24
Plan Fractions Treated to Date: 15
Plan Prescribed Dose Per Fraction: 1.8 Gy
Plan Total Fractions Prescribed: 25
Plan Total Prescribed Dose: 45 Gy
Reference Point Dosage Given to Date: 27 Gy
Reference Point Session Dosage Given: 1.8 Gy
Session Number: 15

## 2023-10-03 ENCOUNTER — Other Ambulatory Visit: Payer: Self-pay

## 2023-10-03 ENCOUNTER — Other Ambulatory Visit: Payer: Self-pay | Admitting: Cardiovascular Disease

## 2023-10-03 ENCOUNTER — Other Ambulatory Visit: Payer: Self-pay | Admitting: *Deleted

## 2023-10-03 ENCOUNTER — Ambulatory Visit
Admission: RE | Admit: 2023-10-03 | Discharge: 2023-10-03 | Disposition: A | Discharge status: Home / Self Care | Payer: Medicare Other | Source: Ambulatory Visit | Attending: Radiation Oncology | Admitting: Radiation Oncology

## 2023-10-03 DIAGNOSIS — I480 Paroxysmal atrial fibrillation: Secondary | ICD-10-CM

## 2023-10-03 DIAGNOSIS — C541 Malignant neoplasm of endometrium: Secondary | ICD-10-CM | POA: Diagnosis not present

## 2023-10-03 LAB — RAD ONC ARIA SESSION SUMMARY
Course Elapsed Days: 27
Plan Fractions Treated to Date: 16
Plan Prescribed Dose Per Fraction: 1.8 Gy
Plan Total Fractions Prescribed: 25
Plan Total Prescribed Dose: 45 Gy
Reference Point Dosage Given to Date: 28.8 Gy
Reference Point Session Dosage Given: 1.8 Gy
Session Number: 16

## 2023-10-03 MED ORDER — ONDANSETRON HCL 8 MG PO TABS: 8.0000 mg | ORAL_TABLET | Freq: Three times a day (TID) | ORAL | 0 refills | Status: DC | PRN

## 2023-10-03 NOTE — Telephone Encounter (Signed)
Referral information faxed daughter notified.

## 2023-10-03 NOTE — Telephone Encounter (Signed)
Refill Request.  

## 2023-10-03 NOTE — Telephone Encounter (Signed)
Prescription refill request for Eliquis received. Indication: afib  Last office visit: Preop appt on 06/23/2023, [facility] 07/27/2022, [facility] Scr: 0.94, 07/27/2023 Age: 89 yo  Weight: 76 kg   Pt scheduled to see Gollan on 10/18/2023

## 2023-10-04 ENCOUNTER — Other Ambulatory Visit: Payer: Self-pay

## 2023-10-04 ENCOUNTER — Ambulatory Visit
Admission: RE | Admit: 2023-10-04 | Discharge: 2023-10-04 | Disposition: A | Discharge status: Home / Self Care | Payer: Medicare Other | Source: Ambulatory Visit | Attending: Radiation Oncology | Admitting: Radiation Oncology

## 2023-10-04 DIAGNOSIS — C541 Malignant neoplasm of endometrium: Secondary | ICD-10-CM | POA: Diagnosis not present

## 2023-10-04 LAB — RAD ONC ARIA SESSION SUMMARY
Course Elapsed Days: 28
Plan Fractions Treated to Date: 17
Plan Prescribed Dose Per Fraction: 1.8 Gy
Plan Total Fractions Prescribed: 25
Plan Total Prescribed Dose: 45 Gy
Reference Point Dosage Given to Date: 30.6 Gy
Reference Point Session Dosage Given: 1.8 Gy
Session Number: 17

## 2023-10-05 ENCOUNTER — Ambulatory Visit
Admission: RE | Admit: 2023-10-05 | Discharge: 2023-10-05 | Disposition: A | Discharge status: Home / Self Care | Payer: Medicare Other | Source: Ambulatory Visit | Attending: Radiation Oncology | Admitting: Radiation Oncology

## 2023-10-05 ENCOUNTER — Other Ambulatory Visit: Payer: Self-pay

## 2023-10-05 DIAGNOSIS — C541 Malignant neoplasm of endometrium: Secondary | ICD-10-CM | POA: Diagnosis not present

## 2023-10-05 LAB — RAD ONC ARIA SESSION SUMMARY
Course Elapsed Days: 29
Plan Fractions Treated to Date: 18
Plan Prescribed Dose Per Fraction: 1.8 Gy
Plan Total Fractions Prescribed: 25
Plan Total Prescribed Dose: 45 Gy
Reference Point Dosage Given to Date: 32.4 Gy
Reference Point Session Dosage Given: 1.8 Gy
Session Number: 18

## 2023-10-06 ENCOUNTER — Inpatient Hospital Stay: Payer: Medicare Other

## 2023-10-06 ENCOUNTER — Ambulatory Visit
Admission: RE | Admit: 2023-10-06 | Discharge: 2023-10-06 | Disposition: A | Discharge status: Home / Self Care | Payer: Medicare Other | Source: Ambulatory Visit | Attending: Radiation Oncology | Admitting: Radiation Oncology

## 2023-10-06 ENCOUNTER — Other Ambulatory Visit: Payer: Self-pay

## 2023-10-06 DIAGNOSIS — C541 Malignant neoplasm of endometrium: Secondary | ICD-10-CM | POA: Diagnosis not present

## 2023-10-06 LAB — CBC (CANCER CENTER ONLY)
HCT: 37.2 % (ref 36.0–46.0)
Hemoglobin: 11.9 g/dL — ABNORMAL LOW (ref 12.0–15.0)
MCH: 28.5 pg (ref 26.0–34.0)
MCHC: 32 g/dL (ref 30.0–36.0)
MCV: 89 fL (ref 80.0–100.0)
Platelet Count: 232 10*3/uL (ref 150–400)
RBC: 4.18 MIL/uL (ref 3.87–5.11)
RDW: 16.3 % — ABNORMAL HIGH (ref 11.5–15.5)
WBC Count: 3.6 10*3/uL — ABNORMAL LOW (ref 4.0–10.5)
nRBC: 0 % (ref 0.0–0.2)

## 2023-10-06 LAB — RAD ONC ARIA SESSION SUMMARY
Course Elapsed Days: 30
Plan Fractions Treated to Date: 19
Plan Prescribed Dose Per Fraction: 1.8 Gy
Plan Total Fractions Prescribed: 25
Plan Total Prescribed Dose: 45 Gy
Reference Point Dosage Given to Date: 34.2 Gy
Reference Point Session Dosage Given: 1.8 Gy
Session Number: 19

## 2023-10-07 ENCOUNTER — Ambulatory Visit
Admission: RE | Admit: 2023-10-07 | Discharge: 2023-10-07 | Disposition: A | Discharge status: Home / Self Care | Payer: Medicare Other | Source: Ambulatory Visit | Attending: Radiation Oncology | Admitting: Radiation Oncology

## 2023-10-07 ENCOUNTER — Other Ambulatory Visit: Payer: Self-pay

## 2023-10-07 DIAGNOSIS — C541 Malignant neoplasm of endometrium: Secondary | ICD-10-CM | POA: Diagnosis not present

## 2023-10-07 LAB — RAD ONC ARIA SESSION SUMMARY
Course Elapsed Days: 31
Plan Fractions Treated to Date: 20
Plan Prescribed Dose Per Fraction: 1.8 Gy
Plan Total Fractions Prescribed: 25
Plan Total Prescribed Dose: 45 Gy
Reference Point Dosage Given to Date: 36 Gy
Reference Point Session Dosage Given: 1.8 Gy
Session Number: 20

## 2023-10-10 ENCOUNTER — Ambulatory Visit
Admission: RE | Admit: 2023-10-10 | Discharge: 2023-10-10 | Disposition: A | Discharge status: Home / Self Care | Payer: Medicare Other | Source: Ambulatory Visit | Attending: Radiation Oncology | Admitting: Radiation Oncology

## 2023-10-10 ENCOUNTER — Ambulatory Visit: Payer: Medicare Other

## 2023-10-10 ENCOUNTER — Other Ambulatory Visit: Payer: Self-pay

## 2023-10-10 DIAGNOSIS — C541 Malignant neoplasm of endometrium: Secondary | ICD-10-CM | POA: Diagnosis not present

## 2023-10-10 LAB — RAD ONC ARIA SESSION SUMMARY
Course Elapsed Days: 34
Plan Fractions Treated to Date: 21
Plan Prescribed Dose Per Fraction: 1.8 Gy
Plan Total Fractions Prescribed: 25
Plan Total Prescribed Dose: 45 Gy
Reference Point Dosage Given to Date: 37.8 Gy
Reference Point Session Dosage Given: 1.8 Gy
Session Number: 21

## 2023-10-11 ENCOUNTER — Other Ambulatory Visit: Payer: Self-pay

## 2023-10-11 ENCOUNTER — Ambulatory Visit
Admission: RE | Admit: 2023-10-11 | Discharge: 2023-10-11 | Disposition: A | Discharge status: Home / Self Care | Payer: Medicare Other | Source: Ambulatory Visit | Attending: Radiation Oncology | Admitting: Radiation Oncology

## 2023-10-11 DIAGNOSIS — C541 Malignant neoplasm of endometrium: Secondary | ICD-10-CM | POA: Diagnosis not present

## 2023-10-11 LAB — RAD ONC ARIA SESSION SUMMARY
Course Elapsed Days: 35
Plan Fractions Treated to Date: 22
Plan Prescribed Dose Per Fraction: 1.8 Gy
Plan Total Fractions Prescribed: 25
Plan Total Prescribed Dose: 45 Gy
Reference Point Dosage Given to Date: 39.6 Gy
Reference Point Session Dosage Given: 1.8 Gy
Session Number: 22

## 2023-10-12 ENCOUNTER — Ambulatory Visit
Admission: RE | Admit: 2023-10-12 | Discharge: 2023-10-12 | Disposition: A | Discharge status: Home / Self Care | Payer: Medicare Other | Source: Ambulatory Visit | Attending: Radiation Oncology | Admitting: Radiation Oncology

## 2023-10-12 ENCOUNTER — Other Ambulatory Visit: Payer: Self-pay

## 2023-10-12 DIAGNOSIS — C541 Malignant neoplasm of endometrium: Secondary | ICD-10-CM | POA: Diagnosis not present

## 2023-10-12 LAB — RAD ONC ARIA SESSION SUMMARY
Course Elapsed Days: 36
Plan Fractions Treated to Date: 23
Plan Prescribed Dose Per Fraction: 1.8 Gy
Plan Total Fractions Prescribed: 25
Plan Total Prescribed Dose: 45 Gy
Reference Point Dosage Given to Date: 41.4 Gy
Reference Point Session Dosage Given: 1.8 Gy
Session Number: 23

## 2023-10-13 ENCOUNTER — Other Ambulatory Visit: Payer: Self-pay

## 2023-10-13 ENCOUNTER — Ambulatory Visit
Admission: RE | Admit: 2023-10-13 | Discharge: 2023-10-13 | Disposition: A | Discharge status: Home / Self Care | Payer: Medicare Other | Source: Ambulatory Visit | Attending: Radiation Oncology | Admitting: Radiation Oncology

## 2023-10-13 DIAGNOSIS — C541 Malignant neoplasm of endometrium: Secondary | ICD-10-CM | POA: Diagnosis not present

## 2023-10-13 LAB — RAD ONC ARIA SESSION SUMMARY
Course Elapsed Days: 37
Plan Fractions Treated to Date: 24
Plan Prescribed Dose Per Fraction: 1.8 Gy
Plan Total Fractions Prescribed: 25
Plan Total Prescribed Dose: 45 Gy
Reference Point Dosage Given to Date: 43.2 Gy
Reference Point Session Dosage Given: 1.8 Gy
Session Number: 24

## 2023-10-14 ENCOUNTER — Other Ambulatory Visit: Payer: Self-pay

## 2023-10-14 ENCOUNTER — Ambulatory Visit
Admission: RE | Admit: 2023-10-14 | Discharge: 2023-10-14 | Disposition: A | Discharge status: Home / Self Care | Payer: Medicare Other | Source: Ambulatory Visit | Attending: Radiation Oncology | Admitting: Radiation Oncology

## 2023-10-14 DIAGNOSIS — C541 Malignant neoplasm of endometrium: Secondary | ICD-10-CM | POA: Insufficient documentation

## 2023-10-14 LAB — RAD ONC ARIA SESSION SUMMARY
Course Elapsed Days: 38
Plan Fractions Treated to Date: 25
Plan Prescribed Dose Per Fraction: 1.8 Gy
Plan Total Fractions Prescribed: 25
Plan Total Prescribed Dose: 45 Gy
Reference Point Dosage Given to Date: 45 Gy
Reference Point Session Dosage Given: 1.8 Gy
Session Number: 25

## 2023-10-17 NOTE — Progress Notes (Unsigned)
Date:  10/18/2023   ID:  Barbara Mooney, DOB [birth year], MRN [id number]  Patient Location:  [address] [place] [address]   Provider location:   [facility], [place] office  PCP:  Patrice Paradise, MD  Cardiologist:  Hubbard Robinson [facility]   Chief Complaint  Patient presents with   3 month follow up     Patient just finished 25 rounds of radiation for endometrial cancer. Patient c/o weakness and fatigue. Medications reviewed by the patient verbally.     History of Present Illness:    Barbara Mooney is a 89 y.o. 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 Who presents for routine follow-up of her permanent atrial fibrillation, bradycardia  Last seen in clinic by myself August 2023 Seen by one of our providers July 2024  Completed radiation for endometrial cancer, 24 cycles Scheduled to be seen in follow-up to determine if additional treatment needed  Lives alone, husband passed May 2021 Presents with daughter today Legs weak, no regular walking or exercise program, reports no energy Chronic fatigue which she attributes to recent cancer treatments  Denies significant leg swelling Rare use of Lasix  Continues on diltiazem extended release 180 twice daily, Metoprolol previously held for fatigue Tolerating Eliquis 5 twice daily,  Denies any recent falls  Lab work reviewed HBA1c 6.9 Total cholesterol 234, LDL 148, preferred no cholesterol medication BUN 20 creatinine 1.1  Other past medical history reviewed Broke arm left in oct 2019  EKG personally reviewed by myself on todays visit EKG Interpretation Date/Time:  Tuesday October 18 2023 10:46:14 EST Ventricular  Rate:  82 PR Interval:    QRS Duration:  142 QT Interval:  394 QTC Calculation: 460 R Axis:   -2  Text Interpretation: Atrial fibrillation Right bundle branch block When compared with ECG of 23-Jun-2023 15:06, No significant change was found Confirmed by Julien Nordmann [phone]) on 10/18/2023 10:55:28 AM     This patients CHA2DS2-VASc Score and unadjusted Ischemic Stroke Rate (% per year) is equal to 7.2 % stroke rate/year from a score of 5   Above score calculated as 1 point each if present [CHF, HTN, DM, Vascular=MI/PAD/Aortic Plaque, Age if 65-74, or Female] Above score calculated as 2 points each if present [Age > 75, or Stroke/TIA/TE]  Cardioversion on 01/20/2017, back to NSR    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   a.) stage 0 (high grade cTis (DCIS), cN0, cM0) with comedonecrosis -- > s/p lumpectomy + XRT   Endometrial cancer (HCC) 05/25/2023   a.) pathology (+) for well differentiated endometroid carcinoma (FIGO 1)   Family history of breast cancer    Family history of colon cancer    Family history of Hodgkin's disease    Family history of ovarian cancer    Follicular non-Hodgkin's lymphoma (HCC) 2018   a.) stage I E follicular lymphoma  consistent with B-cell germinal center origin of the RIGHT proximal tibia s/p XRT   GERD (gastroesophageal reflux disease)    History of bilateral cataract extraction 02/2018   History of echocardiogram    a.) TTE 10/31/2016: EF 55-60%, mild LVH, mild MAC, mod LAE, triv AR, mild TR; b.)  TTE 08/21/2020: EF 60-65%, no RWMAs. RVSP 37.9mmHg. Mod dil LA. Mild to mod MR/TR. Mild AoV sclerosis; c.) TTE 07/08/2023: EF 60-65%, mild RVE, sev BAE, mod MAC, mild-mod TR, mild MR, RVSP 30.5   History of  hiatal hernia    HLD (hyperlipidemia)    HOH (hard of hearing)    Hypertension    Hypokalemia    Hypothyroidism    Leg pain    a. 08/2020 ABIs: R 1.19, L 0.97.   Mixed incontinence    MRSA infection    a.) remote history; posterior torso   Multiple thyroid nodules    On apixaban therapy    Peripheral edema    Permanent atrial fibrillation (HCC) 10/2016   a.) CHA2DS2VASc = 6 (age x2, sex, HTN, vascular disease history, T2DM);  b.) s/p DCCV 01/20/2017 (150 J x 1) --> failed; c.) rate/rhythm maintained on oral diltiazem; chronically anticoagulated with apixaban   RBBB (right bundle branch block)    Recurrent UTI (urinary tract infection)    Sepsis (HCC)    Unsteady gait    Varicose veins of both lower extremities    Vitamin B12 deficiency    Past Surgical History:  Procedure Laterality Date   BONE BIOPSY Right 2018   tibia   BREAST BIOPSY Right 02/26/2021   ffirm bx-"X" clip-HIGH-GRADE DUCTAL CARCINOMA IN SITU   BREAST LUMPECTOMY Right 03/09/2021   Procedure: BREAST LUMPECTOMY;  Surgeon: Earline Mayotte, MD;  Location: [facility];  Service: General;  Laterality: Right;   CARDIOVERSION N/A 01/20/2017   Procedure: CARDIOVERSION;  Surgeon: Antonieta Iba, MD;  Location: [facility];  Service: Cardiovascular;  Laterality: N/A;   CATARACT EXTRACTION W/PHACO Right 02/15/2018   Procedure: CATARACT EXTRACTION PHACO AND INTRAOCULAR LENS PLACEMENT (IOC);  Surgeon: Galen Manila, MD;  Location: [facility];  Service: Ophthalmology;  Laterality: Right;  [place] 01:22.8 AP% 18.9 CDE 15.62 Fluid Pack Lot # [id number]   CATARACT EXTRACTION W/PHACO Left 03/08/2018   Procedure: CATARACT EXTRACTION PHACO AND INTRAOCULAR LENS PLACEMENT (IOC);  Surgeon: Galen Manila, MD;  Location: [facility];  Service: Ophthalmology;  Laterality: Left;  [place] 01:05 AP% 15.8 CDE 10.28 Fluid pak lot # [id number]   COLONOSCOPY     TOTAL LAPAROSCOPIC HYSTERECTOMY WITH BILATERAL SALPINGO OOPHORECTOMY N/A 07/27/2023    Procedure: TOTAL LAPAROSCOPIC HYSTERECTOMY WITH BILATERAL SALPINGO OOPHORECTOMY, SENTINEL LYMPH NODE INJECTION AND MAPPING, NODE DISSECTION;  Surgeon: [name], MD;  Location: [facility];  Service: Gynecology;  Laterality: N/A;     Current Meds  Medication Sig   acetaminophen (TYLENOL) 500 MG tablet Take 500 mg by mouth 3 (three) times daily.   apixaban (ELIQUIS) 5 MG TABS tablet TAKE 1 TABLET BY MOUTH TWICE A DAY   CRANBERRY PO Take 1 capsule by mouth 2 (two) times daily.    cyanocobalamin (,VITAMIN B-12,) 1000 MCG/ML injection Inject 1,000 mcg into the muscle every 30 (thirty) days.    diltiazem (CARDIZEM CD) 180 MG 24 hr capsule TAKE 1 CAPSULE (180 MG TOTAL) BY MOUTH TWICE A DAY   furosemide (LASIX) 20 MG tablet TAKE 1 TABLET (20 MG TOTAL) BY MOUTH DAILY AS NEEDED FOR FLUID OR EDEMA (SOB).   LACTOBACILLUS PO Take 1  tablet by mouth daily at 6 (six) AM.   metFORMIN (GLUCOPHAGE) 500 MG tablet Take 500 mg by mouth 2 (two) times daily with a meal.   olmesartan (BENICAR) 40 MG tablet TAKE 1 TABLET BY MOUTH EVERY DAY   omeprazole (PRILOSEC) 20 MG capsule Take 1 capsule (20 mg total) by mouth daily. (Patient taking differently: Take 20 mg by mouth every morning.)   ondansetron (ZOFRAN) 8 MG tablet Take 1 tablet (8 mg total) by mouth every 8 (eight) hours as needed for nausea or vomiting.   Trolamine Salicylate (BLUE-EMU HEMP EX) Apply 1 application topically daily as needed (Knee pain).     Allergies:   Nitrofurantoin and Sulfa antibiotics   Social History   Tobacco Use   Smoking status: Never   Smokeless tobacco: Never  Vaping Use   Vaping status: Never Used  Substance Use Topics   Alcohol use: No   Drug use: No      Family Hx: The patient's family history includes Atrial fibrillation in her sister; Breast cancer (age of onset: 89) in her sister; Colon cancer in her maternal aunt; Heart disease in her brother; Ovarian cancer in her maternal aunt; Stroke in her sister. There is no  history of Kidney cancer or Bladder Cancer.  ROS:   Please see the history of present illness.    Review of Systems  Constitutional: Negative.   HENT: Negative.    Respiratory: Negative.    Cardiovascular: Negative.   Gastrointestinal: Negative.   Musculoskeletal: Negative.        Leg weakness  Neurological: Negative.   Psychiatric/Behavioral: Negative.    All other systems reviewed and are negative.   Labs/Other Tests and Data Reviewed:    Recent Labs: 06/22/2023: ALT 12; TSH 1.437 07/27/2023: BUN 16; Creatinine, Ser 0.94; Potassium 3.9; Sodium 138 10/06/2023: Hemoglobin 11.9; Platelet Count 232   Recent Lipid Panel No results found for: "CHOL", "TRIG", "HDL", "CHOLHDL", "LDLCALC", "LDLDIRECT"  Wt Readings from Last 3 Encounters:  10/18/23 165 lb 2 oz (74.9 kg)  08/31/23 167 lb 8 oz (76 kg)  08/10/23 167 lb 8 oz (76 kg)     Exam:    Vital Signs: BP (!) 140/70 (BP Location: Left Arm, Patient Position: Sitting, Cuff Size: Normal)   Pulse 82   Ht 5\' 6"  (1.676 m)   Wt 165 lb 2 oz (74.9 kg)   SpO2 96%   BMI 26.65 kg/m   Constitutional:  oriented to person, place, and time. No distress.  HENT:  Head: Grossly normal Eyes:  no discharge. No scleral icterus.  Neck: No JVD, no carotid bruits  Cardiovascular: Irregularly irregular no murmurs appreciated Pulmonary/Chest: Clear to auscultation bilaterally, no wheezes or rails Abdominal: Soft.  no distension.  no tenderness.  Musculoskeletal: Normal range of motion Neurological:  normal muscle tone. Coordination normal. No atrophy Skin: Skin warm and dry Psychiatric: normal affect, pleasant   ASSESSMENT & PLAN:     Paroxysmal atrial fibrillation (HCC) - Rate well-controlled on diltiazem ER 180 twice daily Tolerating Eliquis 5 twice daily No recent falls, stressed importance of leg exercises Beta-blockers previously held for fatigue   Essential hypertension Blood pressure is well controlled on today's visit. No  changes made to the medications.  Leg swelling Lasix sparingly Weight trending downward, no change in leg swelling  Unsteady gait Recommended regular walking program Legs are weak after she broke her arm October 2019 Legs weaker now after recent cancer treatments, recommended regular walking program   Hyperlipidemia  Total cholesterol 230 Previously not inclined to be on a cholesterol medication Discussed with her again   Signed, Julien Nordmann, MD  10/18/2023 10:54 AM    [facility] [address], [place], [place] [address]

## 2023-10-18 ENCOUNTER — Ambulatory Visit: Payer: Medicare Other | Attending: Cardiovascular Disease | Admitting: Cardiovascular Disease

## 2023-10-18 ENCOUNTER — Encounter: Payer: Self-pay | Admitting: Cardiovascular Disease

## 2023-10-18 VITALS — BP 140/70 | HR 82 | Ht 66.0 in | Wt 165.1 lb

## 2023-10-18 DIAGNOSIS — R0600 Dyspnea, unspecified: Secondary | ICD-10-CM | POA: Diagnosis present

## 2023-10-18 DIAGNOSIS — I4821 Permanent atrial fibrillation: Secondary | ICD-10-CM | POA: Diagnosis not present

## 2023-10-18 DIAGNOSIS — I739 Peripheral vascular disease, unspecified: Secondary | ICD-10-CM | POA: Insufficient documentation

## 2023-10-18 DIAGNOSIS — I10 Essential (primary) hypertension: Secondary | ICD-10-CM | POA: Insufficient documentation

## 2023-10-18 DIAGNOSIS — I480 Paroxysmal atrial fibrillation: Secondary | ICD-10-CM | POA: Diagnosis present

## 2023-10-18 DIAGNOSIS — E782 Mixed hyperlipidemia: Secondary | ICD-10-CM | POA: Insufficient documentation

## 2023-10-18 DIAGNOSIS — R5383 Other fatigue: Secondary | ICD-10-CM | POA: Diagnosis present

## 2023-10-18 DIAGNOSIS — R600 Localized edema: Secondary | ICD-10-CM | POA: Diagnosis present

## 2023-10-18 MED ORDER — APIXABAN 5 MG PO TABS
5.0000 mg | ORAL_TABLET | Freq: Two times a day (BID) | ORAL | 0 refills | Status: DC
Start: 2023-10-18 — End: 2023-10-18

## 2023-10-18 MED ORDER — DILTIAZEM HCL ER COATED BEADS 180 MG PO CP24: 180.0000 mg | ORAL_CAPSULE | Freq: Two times a day (BID) | ORAL | 3 refills | Status: DC

## 2023-10-18 MED ORDER — FUROSEMIDE 20 MG PO TABS: 20.0000 mg | ORAL_TABLET | Freq: Every day | ORAL | 3 refills | Status: DC | PRN

## 2023-10-18 MED ORDER — OLMESARTAN MEDOXOMIL 40 MG PO TABS: 40.0000 mg | ORAL_TABLET | Freq: Every day | ORAL | 3 refills | Status: DC

## 2023-10-18 MED ORDER — APIXABAN 5 MG PO TABS
5.0000 mg | ORAL_TABLET | Freq: Two times a day (BID) | ORAL | 3 refills | Status: DC
Start: 2023-10-18 — End: 2024-06-06

## 2023-10-18 NOTE — Patient Instructions (Signed)

## 2023-11-21 ENCOUNTER — Ambulatory Visit: Payer: Medicare Other | Admitting: Radiation Oncology

## 2023-11-23 ENCOUNTER — Encounter: Payer: Self-pay | Admitting: Obstetrics and Gynecology

## 2023-11-23 ENCOUNTER — Encounter: Payer: Self-pay | Admitting: Radiation Oncology

## 2023-11-23 ENCOUNTER — Ambulatory Visit
Admission: RE | Admit: 2023-11-23 | Discharge: 2023-11-23 | Disposition: A | Discharge status: Home / Self Care | Payer: Medicare Other | Source: Ambulatory Visit | Attending: Radiation Oncology | Admitting: Radiation Oncology

## 2023-11-23 ENCOUNTER — Inpatient Hospital Stay: Payer: Medicare Other | Attending: Obstetrics and Gynecology | Admitting: Obstetrics and Gynecology

## 2023-11-23 VITALS — BP 169/93 | HR 98 | Temp 98.2°F | Resp 16 | Wt 160.9 lb

## 2023-11-23 VITALS — BP 168/100 | HR 98 | Temp 98.2°F | Resp 16 | Wt 161.0 lb

## 2023-11-23 DIAGNOSIS — C541 Malignant neoplasm of endometrium: Secondary | ICD-10-CM | POA: Insufficient documentation

## 2023-11-23 DIAGNOSIS — Z7901 Long term (current) use of anticoagulants: Secondary | ICD-10-CM | POA: Diagnosis not present

## 2023-11-23 DIAGNOSIS — Z90710 Acquired absence of both cervix and uterus: Secondary | ICD-10-CM | POA: Insufficient documentation

## 2023-11-23 DIAGNOSIS — N183 Chronic kidney disease, stage 3 unspecified: Secondary | ICD-10-CM | POA: Diagnosis not present

## 2023-11-23 DIAGNOSIS — Z79899 Other long term (current) drug therapy: Secondary | ICD-10-CM | POA: Diagnosis not present

## 2023-11-23 DIAGNOSIS — Z7984 Long term (current) use of oral hypoglycemic drugs: Secondary | ICD-10-CM | POA: Insufficient documentation

## 2023-11-23 DIAGNOSIS — I4821 Permanent atrial fibrillation: Secondary | ICD-10-CM | POA: Diagnosis not present

## 2023-11-23 DIAGNOSIS — Z86000 Personal history of in-situ neoplasm of breast: Secondary | ICD-10-CM | POA: Diagnosis not present

## 2023-11-23 DIAGNOSIS — E039 Hypothyroidism, unspecified: Secondary | ICD-10-CM | POA: Diagnosis not present

## 2023-11-23 DIAGNOSIS — Z9079 Acquired absence of other genital organ(s): Secondary | ICD-10-CM | POA: Diagnosis not present

## 2023-11-23 DIAGNOSIS — I129 Hypertensive chronic kidney disease with stage 1 through stage 4 chronic kidney disease, or unspecified chronic kidney disease: Secondary | ICD-10-CM | POA: Insufficient documentation

## 2023-11-23 DIAGNOSIS — M8580 Other specified disorders of bone density and structure, unspecified site: Secondary | ICD-10-CM | POA: Insufficient documentation

## 2023-11-23 DIAGNOSIS — E1122 Type 2 diabetes mellitus with diabetic chronic kidney disease: Secondary | ICD-10-CM | POA: Diagnosis not present

## 2023-11-23 DIAGNOSIS — Z794 Long term (current) use of insulin: Secondary | ICD-10-CM | POA: Insufficient documentation

## 2023-11-23 DIAGNOSIS — Z8572 Personal history of non-Hodgkin lymphomas: Secondary | ICD-10-CM | POA: Diagnosis not present

## 2023-11-23 DIAGNOSIS — Z90722 Acquired absence of ovaries, bilateral: Secondary | ICD-10-CM | POA: Insufficient documentation

## 2023-11-23 NOTE — Progress Notes (Signed)
Radiation Oncology Follow up Note  Name: Barbara Mooney   Date:   11/23/2023 MRN:  [id number] DOB: October 17, 1934    This 89 y.o. 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 This follows a total abdominal hysterectomy with bilateral salpingo-oophorectomy and sentinel lymph node mapping. The patient also received radiation therapy for the breast cancer two years prior.  The patient reports no current pain or problems and has had a vaginal brachytherapy at [facility] with no complications.  She specifically denies any increased lower urinary tract symptoms diarrhea.  The patient also has a history of a mammogram in March and is due for a follow-up in June. The patient was previously followed by Doctor Doristine Counter for the breast cancer, but the current oncologist is not specified..  She specifically [place] breast tenderness cough or bone pain.  Mammograms back in March were BI-RADS 2 benign.  COMPLICATIONS OF TREATMENT: none  FOLLOW UP COMPLIANCE: keeps appointments   PHYSICAL EXAM:  BP (!) 168/100 Comment: Patient will monitor at home and contact PCP if BP still elevated  Pulse 98   Temp 98.2 F (36.8 C)   Resp 16   Wt 161 lb (73 kg)   BMI 25.99 kg/m  Well-developed well-nourished patient in NAD. HEENT reveals PERLA, EOMI, discs not visualized.  Oral cavity is clear. No oral mucosal lesions are identified. Neck is clear  without evidence of cervical or supraclavicular adenopathy. Lungs are clear to A&P. Cardiac examination is essentially unremarkable with regular rate and rhythm without murmur rub or thrill. Abdomen is benign with no organomegaly or masses noted. Motor sensory and DTR levels are equal and symmetric in the upper and lower extremities. Cranial nerves II through XII are grossly intact. Proprioception is intact. No peripheral adenopathy or edema is identified. No motor or sensory levels are noted. Crude visual fields are within normal range.  RADIOLOGY RESULTS: Mammograms reviewed  PLAN: Present time patient is doing well with a low side effect profile from both external beam radiation therapy as well as vaginal brachytherapy.  She will see Dr. Johnnette Litter next and have a pelvic exam performed.  I have asked to see her back in 6 months for follow-up.  Patient meanwhile knows to call with any concerns.  I would like to take this opportunity to thank you for allowing me to participate in the care of your patient.Carmina Miller, MD

## 2023-11-23 NOTE — Progress Notes (Signed)
Gynecologic Oncology Consult Visit   Referring Provider: Dr. Dalbert Garnet  Chief Concern: Endometrial cancer, surveillance visit  Subjective:  Barbara Mooney is a 89 y.o. female who is seen in consultation from Dr. Dalbert Garnet for grade 2 endometrial cancer, s/p TLH, BSO, SLN mapping and biopsies on 07/27/23 . Pathology showed Stage IB grade 2 adenocarcinoma with 8/10 mm invasion with LVSI and cervical stromal involvement. Bilateral SLNs with ITCs. Washings negative. Received adjuvant pelvic RT and vaginal brachy.    No new complaints.  Finished vaginal brachytherapy about a month ago.     Gyn Oncology History Patient experienced PMB which prompted ultrasound.   - Uterus: 8.39 x 4.41 x 6.02 cm  - Ovaries: bilaterally wnl  - Other: 27 mm endometrial lining, 2.8 cm posterior fibroid. The uterus had a maximal endometrial thickness of 27 mm. The endometrial contour appeared irregular and generally thickened. Endometrial findings were unable to be fully examined due to the large, solid mass .   05/25/23- Pap NILM  She underwent endometrial biopsy.  WELL DIFFERENTIATED ENDOMETRIOID CARCINOMA (FIGO I).  - Loss of MLH1 and PMS2. MSH2 and MSH6 intact.    She had possible rectal prolapse on exam and was referred to GI. She takes Eliquis for history of a fib.   She has a history of Breast DCIS and Non Hodgkins Lymphoma.   # Right lower inner biopsy- high grade DCIS, lobular neoplasia, calcifications. She underwent lumpectomy with high grade dcis, PLCIS, lobular neoplasia. Followed by radiation x 3 weeks with boost.    # Right LE Non-hodgkin follicular lymphoma s/p surgery at [facility] and RT with Dr Rushie Chestnut in 2018.  She has had radiation to the right lower extremity.  Genetic Testing: 08/26/21- [facility] Multi-cancer 84 gene panel was negative.   07/27/23 TLH, BSO, SLN mapping and biopsies. Pathology showed 8/10 mm invasion with LVSI and cervical stromal involvement. Bilateral SLNs with ITCs.  Washings  negative.   No new complaints today.  Incisions healing slowly.   Diagnosis 1. Lymph node, sentinel, biopsy, Left external iliac - ONE LYMPH NODE WITH ISOLATED TUMOR CELLS. - PANCYTOKERATIN STAIN CONFIRMS. 2. Lymph node, sentinel, biopsy, Right external iliac - ONE LYMPH NODE WITH ISOLATED TUMOR CELLS. - PANCYTOKERATIN STAIN CONFIRMS. 3. Uterus +/- tubes/ovaries, neoplastic - ENDOMETRIOID CARCINOMA WITH MICROCYSTIC, ELONGATED AND FRAGMENTED (MELF) PATTERN OF INVASION. - CARCINOMA INVADES STROMAL CONNECTIVE TISSUE OF CERVIX. - SEE CANCER SUMMARY BELOW. - BACKGROUND ATROPHIC ENDOMETRIUM WITH ATYPICAL ENDOMETRIAL HYPERPLASIA/EIN. - MYOMETRIUM WITH LEIOMYOMATA, LARGEST MEASURING 1.2 CM. - RIGHT PARATUBAL CYST. - BILATERAL FALLOPIAN TUBES WITH FIMBRIATED END. - BILATERAL OVARIES WITH NO PATHOLOGIC ABNORMALITY. Microscopic Comment 3. CASE SUMMARY: (ENDOMETRIUM) Standard(s): AJCC-UICC 8, FIGO Cancer Report 2023 SPECIMEN Procedure: Total hysterectomy and bilateral salpingo-oophorectomy TUMOR Histologic Type: Endometrioid carcinoma, NOS Histologic Grade: FIGO grade 2 Myometrial Invasion: Present Depth of invasion (millimeters): 8 mm Myometrial thickness (millimeters): 10 mm Percentage of myometrial invasion: 80%   Microscopic Comment(continued) Uterine Serosa Involvement: Not identified Lower Uterine Segment Involvement: Present, myoinvasive Cervical Stromal Involvement: Present Other Tissue/Organ Involvement: Not applicable Lymphatic and/or Vascular Invasion: Present  MLH1: LOSS OF NUCLEAR EXPRESSION MSH2: Preserved nuclear expression MSH6: LOSS OF NUCLEAR EXPRESSION PMS2: LOSS OF NUCLEAR EXPRESSION Methylated MLH1 alleles present. TP53 WT  Personal and family history of malignancy.  Negative [employer] 84 gene germline extended panel test 8/24.   Last colonoscopy in 05/2014- results not available Mammogram 02/25/23- Bi-Rads category 2: benign  Problem List: Patient Active  Problem List   Diagnosis Date Noted  Endometrial cancer, grade I (HCC) 07/27/2023   Counseling and coordination of care 08/26/2021   Family history of breast cancer 07/21/2021   Family history of ovarian cancer 07/21/2021   Family history of colon cancer 07/21/2021   Family history of Hodgkin's disease 07/21/2021   Hyperlipemia, mixed 07/21/2021   Osteopenia 07/21/2021   Acquired trigger finger 06/11/2021   Ductal carcinoma in situ (DCIS) of right breast 03/05/2021   Bradycardia 02/01/2019   Closed Colles' fracture 09/23/2018   Follicular non-Hodgkin's lymphoma of bone (HCC) 11/16/2017   Tibial mass 10/13/2017   Malignant neoplasm of right tibia (HCC) 10/07/2017   Type 2 diabetes mellitus with complication, with long-term current use of insulin (HCC) 08/07/2017   Complete tear of right rotator cuff 05/16/2017   Rotator cuff tendinitis, right 05/16/2017   Encounter for anticoagulation discussion and counseling 02/08/2017   Leg swelling 02/08/2017   Non-Hodgkin lymphoma (HCC) 2018   Unsteady gait    Anemia    Paroxysmal atrial fibrillation (HCC)    Essential hypertension    Sepsis (HCC) 10/28/2016   Degenerative tear of lateral meniscus, left 08/09/2016   Primary osteoarthritis of left knee 08/09/2016   Varicose veins of both legs with edema 03/17/2016   Type 2 diabetes mellitus with stage 3 chronic kidney disease, without long-term current use of insulin (HCC) 01/27/2016   Mixed incontinence 06/11/2015   External hemorrhoid 03/11/2015   Gastroesophageal reflux disease without esophagitis 03/11/2015   Onychomycosis of toenail 03/11/2015   Knee strain, right, subsequent encounter 02/28/2015   Primary osteoarthritis of right knee 02/28/2015   Vitamin B 12 deficiency 06/13/2014   Multinodular goiter (nontoxic) 09/07/2011    Past Medical History: Past Medical History:  Diagnosis Date   Anemia    Aortic atherosclerosis (HCC)    Arthritis    Bradycardia    Bruises easily     Chronic kidney disease (CKD), stage III (moderate) (HCC)    DM (diabetes mellitus), type 2 (HCC)    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   a.) stage 0 (high grade cTis (DCIS), cN0, cM0) with comedonecrosis -- > s/p lumpectomy + XRT   Endometrial cancer (HCC) 05/25/2023   a.) pathology (+) for well differentiated endometroid carcinoma (FIGO 1)   Family history of breast cancer    Family history of colon cancer    Family history of Hodgkin's disease    Family history of ovarian cancer    Follicular non-Hodgkin's lymphoma (HCC) 2018   a.) 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 RVSP 37.14mmHg. Mod dil LA. Mild to mod MR/TR. Mild AoV sclerosis; c.) TTE 07/08/2023: EF 60-65%, mild RVE, sev BAE, mod MAC, mild-mod TR, mild MR, RVSP 30.5   History of hiatal hernia    HLD (hyperlipidemia)    HOH (hard of hearing)    Hypertension    Hypokalemia    Hypothyroidism    Leg pain    a. 08/2020 ABIs: R 1.19, L 0.97.   Mixed incontinence    MRSA infection    a.) remote history; posterior torso   Multiple thyroid nodules    On apixaban therapy    Peripheral edema  Permanent atrial fibrillation (HCC) 10/2016   a.) CHA2DS2VASc = 6 (age x2, sex, HTN, vascular disease history, T2DM);  b.) s/p DCCV 01/20/2017 (150 J x 1) --> failed; c.) rate/rhythm maintained on oral diltiazem; chronically anticoagulated with apixaban   RBBB (right bundle branch block)    Recurrent UTI (urinary tract infection)    Sepsis (HCC)    Unsteady gait    Varicose veins of both lower extremities    Vitamin B12 deficiency     Past Surgical History: Past Surgical History:  Procedure Laterality  Date   BONE BIOPSY Right 2018   tibia   BREAST BIOPSY Right 02/26/2021   ffirm bx-"X" clip-HIGH-GRADE DUCTAL CARCINOMA IN SITU   BREAST LUMPECTOMY Right 03/09/2021   Procedure: BREAST LUMPECTOMY;  Surgeon: Earline Mayotte, MD;  Location: [facility] OR[facility];  Service: General;  Laterality: Right;   CARDIOVERSION N/A 01/20/2017   Procedure: CARDIOVERSION;  Surgeon: Antonieta Iba, MD;  Location: [facility];  Service: Cardiovascular;  Laterality: N/A;   CATARACT EXTRACTION W/PHACO Right 02/15/2018   Procedure: CATARACT EXTRACTION PHACO AND INTRAOCULAR LENS PLACEMENT (IOC);  Surgeon: Galen Manila, MD;  Location: [facility];  Service: Ophthalmology;  Laterality: Right;  [place] 01:22.8 AP% 18.9 CDE 15.62 Fluid Pack Lot # [id number]   CATARACT EXTRACTION W/PHACO Left 03/08/2018   Procedure: CATARACT EXTRACTION PHACO AND INTRAOCULAR LENS PLACEMENT (IOC);  Surgeon: Galen Manila, MD;  Location: [facility];  Service: Ophthalmology;  Laterality: Left;  [place] 01:05 AP% 15.8 CDE 10.28 Fluid pak lot # [id number]   COLONOSCOPY     TOTAL LAPAROSCOPIC HYSTERECTOMY WITH BILATERAL SALPINGO OOPHORECTOMY N/A 07/27/2023   Procedure: TOTAL LAPAROSCOPIC HYSTERECTOMY WITH BILATERAL SALPINGO OOPHORECTOMY, SENTINEL LYMPH NODE INJECTION AND MAPPING, NODE DISSECTION;  Surgeon: [name], MD;  Location: [facility];  Service: Gynecology;  Laterality: N/A;    Past Gynecologic History:  Per HPI  OB History:  OB History  Gravida Para Term Preterm AB Living  2         2  SAB IAB Ectopic Multiple Live Births               # Outcome Date GA Lbr Len/2nd Weight Sex Type Anes PTL Lv  2 Gravida           1 Gravida             Family History: Family History  Problem Relation Age of Onset   Breast cancer Sister 32   Stroke Sister    Atrial fibrillation Sister    Heart disease Brother    Ovarian cancer Maternal Aunt    Colon cancer Maternal Aunt    Kidney cancer Neg Hx    Bladder Cancer Neg Hx     Social  History: Social History   Socioeconomic History   Marital status: Widowed    Spouse name: Not on file   Number of children: Not on file   Years of education: Not on file   Highest education level: Not on file  Occupational History   Not on file  Tobacco Use   Smoking status: Never   Smokeless tobacco: Never  Vaping Use   Vaping status: Never Used  Substance and Sexual Activity   Alcohol use: No   Drug use: No   Sexual activity: Not Currently  Other Topics Concern   Not on file  Social History Narrative   Lives by self; son lives about 2 miles. Never smoked; no alcohol. Used to [employer]  Financial Resource Strain: Low Risk  (07/20/2023)   Received from [facility]   Overall Financial Resource Strain (CARDIA)    Difficulty of Paying Living Expenses: Not hard at all  Food Insecurity: No Food Insecurity (07/27/2023)   Hunger Vital Sign    Worried About Running Out of Food in the Last Year: Never true    Ran Out of Food in the Last Year: Never true  Transportation Needs: No Transportation Needs (07/27/2023)   PRAPARE - Administrator, Civil Service (Medical): No    Lack of Transportation (Non-Medical): No  Physical Activity: Not on file  Stress: Not on file  Social Connections: Not on file  Intimate Partner Violence: Not At Risk (07/27/2023)   Humiliation, Afraid, Rape, and Kick questionnaire    Fear of Current or Ex-Partner: No    Emotionally Abused: No    Physically Abused: No    Sexually Abused: No    Allergies: Allergies  Allergen Reactions   Nitrofurantoin Diarrhea, Nausea Only and Other (See Comments)   Sulfa Antibiotics Nausea Only and Other (See Comments)    Current Medications: Current Outpatient Medications  Medication Sig Dispense Refill   acetaminophen (TYLENOL) 500 MG tablet Take 500 mg by mouth 3 (three) times daily.     apixaban (ELIQUIS) 5 MG TABS tablet Take 1 tablet (5 mg total) by mouth 2  (two) times daily. 180 tablet 3   CRANBERRY PO Take 1 capsule by mouth 2 (two) times daily.      cyanocobalamin (,VITAMIN B-12,) 1000 MCG/ML injection Inject 1,000 mcg into the muscle every 30 (thirty) days.      diltiazem (CARDIZEM CD) 180 MG 24 hr capsule Take 1 capsule (180 mg total) by mouth 2 (two) times daily. 180 capsule 3   furosemide (LASIX) 20 MG tablet Take 1 tablet (20 mg total) by mouth daily as needed for fluid or edema (SOB). 90 tablet 3   LACTOBACILLUS PO Take 1 tablet by mouth daily at 6 (six) AM.     metFORMIN (GLUCOPHAGE) 500 MG tablet Take 500 mg by mouth 2 (two) times daily with a meal.     olmesartan (BENICAR) 40 MG tablet Take 1 tablet (40 mg total) by mouth daily. 90 tablet 3   omeprazole (PRILOSEC) 20 MG capsule Take 1 capsule (20 mg total) by mouth daily. (Patient taking differently: Take 20 mg by mouth every morning.) 30 capsule 6   ondansetron (ZOFRAN) 4 MG tablet Take 4 mg by mouth every 8 (eight) hours as needed.     ondansetron (ZOFRAN) 8 MG tablet Take 1 tablet (8 mg total) by mouth every 8 (eight) hours as needed for nausea or vomiting. 20 tablet 0   Trolamine Salicylate (BLUE-EMU HEMP EX) Apply 1 application topically daily as needed (Knee pain).     docusate sodium (COLACE) 100 MG capsule Take 1 capsule (100 mg total) by mouth 2 (two) times daily. To keep stools soft (Patient not taking: Reported on 10/18/2023) 30 capsule 0   No current facility-administered medications for this visit.   Review of Systems  General: negative for fevers, changes in weight or night sweats Skin: negative for changes in moles or sores or rash Eyes: negative for changes in vision HEENT: negative for change in hearing, tinnitus, voice changes Pulmonary: negative for dyspnea, orthopnea, productive cough, wheezing Cardiac: negative for palpitations, pain Gastrointestinal: negative for nausea, vomiting, constipation, diarrhea, hematemesis, hematochezia Genitourinary/Sexual: negative  for dysuria, retention, hematuria, incontinence Musculoskeletal: pain in  the legs and weakness Hematology: negative for easy bruising, abnormal bleeding Neurologic/Psych: negative for headaches, seizures, paralysis, numbness  Objective:  Physical Examination:  BP (!) 169/93   Pulse 98   Temp 98.2 F (36.8 C)   Resp 16   Wt 160 lb 15 oz (73 kg)   BMI 25.98 kg/m    ECOG Performance Status: 0 - Asymptomatic  GENERAL: Patient is an elderly appearing in no acute distress HEENT:  Sclera clear. Anicteric NODES:  Negative axillary, supraclavicular, inguinal lymph node survery LUNGS:  Clear to auscultation bilaterally.   HEART:  Regular rate and rhythm.  ABDOMEN:  Soft, nontender.  No hernias, LS incisions intact, healing slowly with a bit of redness. No masses or ascites EXTREMITIES:  No peripheral edema. Atraumatic. No cyanosis SKIN:  Clear with no obvious rashes or skin changes.  NEURO:  Nonfocal. Well oriented.  Appropriate affect.  Pelvic EGBUS/Vagina: normal, cuff healed well. No radiation reaction or necrosis. Bimanual: normal.  Lab Review 01/28/2023 Thyroid Stimulating Hormone (TSH) 0.450-5.330 uIU/ml uIU/mL 2.040   HbA1c, CBC, CMP, TSH ordered  Radiologic Imaging: As per HPI    Assessment:  [name] is a 89 y.o. female diagnosed with PMB diagnosed 6/24 with Stage IIIC1 grade 1 endometrioid endometrial cancer with deficient mismatch repair protein expression (Loss of MLH1 and PMS2 with methylated MLH1 alleles) on endometrial biopsy.  In 07/27/23 underwent TLH, BSO, SLN mapping and biopsies.  Pathology showed Stage IB grade 2 adenocarcinoma with 8/10 mm invasion with LVSI and cervical stromal involvement. Bilateral SLNs with ITCs.  Washings negative. Received adjuvant pelvic RT and vaginal brachy.  NED  MLH1: LOSS OF NUCLEAR EXPRESSION MSH2: Preserved nuclear expression MSH6: LOSS OF NUCLEAR EXPRESSION PMS2: LOSS OF NUCLEAR EXPRESSION Methylated MLH1 alleles  present cw sporadic uterine cancer.  TP53 WT  Personal and family history of malignancy.  Negative [employer] 84 gene germline extended panel test 8/24.  Type 2 diabetes mellitus  Hypothyroidism  A-fib on chronic long-term anticoagulation  Medical co-morbidities complicating care: diabetes. Non-Hodgkin lymphoma and breast cancer (DCIS) status post radiation therapy Plan:   Problem List Items Addressed This Visit       Genitourinary   Endometrial cancer, grade I (HCC) - Primary    We will see her back in 4 months for follow up and she will see Dr Rushie Chestnut in 6 months.    Alinda Dooms, NP  I personally interviewed and examined the patient. Agreed with the above/below plan of care. I have directly contributed to assessment and plan of care of this patient and educated and discussed with patient and family.  [name], MD    CC:  Dr. Dalbert Garnet

## 2023-12-01 ENCOUNTER — Other Ambulatory Visit: Payer: Self-pay | Admitting: *Deleted

## 2023-12-05 ENCOUNTER — Other Ambulatory Visit: Payer: Self-pay | Admitting: *Deleted

## 2023-12-05 MED ORDER — ONDANSETRON HCL 8 MG PO TABS: 8.0000 mg | ORAL_TABLET | Freq: Three times a day (TID) | ORAL | 1 refills | Status: DC | PRN

## 2023-12-22 ENCOUNTER — Ambulatory Visit (INDEPENDENT_AMBULATORY_CARE_PROVIDER_SITE_OTHER): Payer: Medicare Other | Admitting: Internal Medicine

## 2023-12-22 VITALS — BP 120/78 | HR 58 | Temp 97.5°F | Ht 66.0 in | Wt 153.8 lb

## 2023-12-22 DIAGNOSIS — E1122 Type 2 diabetes mellitus with diabetic chronic kidney disease: Secondary | ICD-10-CM | POA: Diagnosis not present

## 2023-12-22 DIAGNOSIS — Z7984 Long term (current) use of oral hypoglycemic drugs: Secondary | ICD-10-CM

## 2023-12-22 DIAGNOSIS — K219 Gastro-esophageal reflux disease without esophagitis: Secondary | ICD-10-CM

## 2023-12-22 DIAGNOSIS — E782 Mixed hyperlipidemia: Secondary | ICD-10-CM | POA: Diagnosis not present

## 2023-12-22 DIAGNOSIS — C8590 Non-Hodgkin lymphoma, unspecified, unspecified site: Secondary | ICD-10-CM

## 2023-12-22 DIAGNOSIS — D649 Anemia, unspecified: Secondary | ICD-10-CM

## 2023-12-22 DIAGNOSIS — N183 Chronic kidney disease, stage 3 unspecified: Secondary | ICD-10-CM

## 2023-12-22 DIAGNOSIS — D0511 Intraductal carcinoma in situ of right breast: Secondary | ICD-10-CM

## 2023-12-22 DIAGNOSIS — E042 Nontoxic multinodular goiter: Secondary | ICD-10-CM | POA: Diagnosis not present

## 2023-12-22 DIAGNOSIS — E538 Deficiency of other specified B group vitamins: Secondary | ICD-10-CM | POA: Diagnosis not present

## 2023-12-22 DIAGNOSIS — I480 Paroxysmal atrial fibrillation: Secondary | ICD-10-CM

## 2023-12-22 DIAGNOSIS — I10 Essential (primary) hypertension: Secondary | ICD-10-CM

## 2023-12-22 MED ORDER — CYANOCOBALAMIN 1000 MCG/ML IJ SOLN
1000.0000 ug | Freq: Once | INTRAMUSCULAR | Status: AC
  Administered 2023-12-22: 1000 ug via INTRAMUSCULAR

## 2023-12-22 NOTE — Patient Instructions (Signed)
Oral B12 per day

## 2023-12-22 NOTE — Progress Notes (Signed)
 Pt presented for their vitamin B12 injection. Pt was identified through two identifiers. Pt tolerated shot well in their left deltoid.

## 2023-12-22 NOTE — Progress Notes (Signed)
 Subjective:    Patient ID: Barbara  B Mooney, female    DOB: 1934/08/19, 89 y.o.   MRN: [id number]  Patient here for  Chief Complaint  Patient presents with   Establish Care    HPI Here to establish care. Barbara Mooney's mother). Reports history of endometrial cancer s/p TLH/BSO with adjuvant pelvic XRT and vaginal brachy. Completed recently - before Thanksgiving. Reports some increased fatigue. Followed by Dr Mancil and Dr Camelia. Has experienced some nausea after eating. Takes zofran  prn. Omeprazole  controls acid reflux. Also has a history of DCIS (high grade) - breast. S/p lumpectomy followed by radiation. History of NHL = s/p XRT. Tries to stay as active as possible. Breathing overall stable.  Known history of afib. On eliquis . Has a history of diabetes and B12 deficiency. Due B12 injection. Some intermittent constipation. Had bm yesterday. Also has a history of UTIs.    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   a.) stage 0 (high grade cTis (DCIS), cN0, cM0) with comedonecrosis -- > s/p lumpectomy + XRT   Endometrial cancer (HCC) 05/25/2023   a.) pathology (+) for well differentiated endometroid carcinoma (FIGO 1)   Family history of breast cancer    Family history of colon cancer    Family history of Hodgkin's disease    Family history of ovarian cancer    Follicular non-Hodgkin's lymphoma (HCC) 2018   a.) 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 of the RIGHT proximal tibia s/p XRT   GERD (gastroesophageal reflux disease)    History of bilateral cataract extraction 02/2018   History of echocardiogram    a.) TTE 10/31/2016: EF 55-60%, mild LVH, mild MAC, mod LAE, triv AR, mild TR; b.)  TTE  08/21/2020: EF 60-65%, no RWMAs. RVSP 37.72mmHg. Mod dil LA. Mild to mod MR/TR. Mild AoV sclerosis; c.) TTE 07/08/2023: EF 60-65%, mild RVE, sev BAE, mod MAC, mild-mod TR, mild MR, RVSP 30.5   History of hiatal hernia    HLD (hyperlipidemia)    HOH (hard of hearing)    Hypertension    Hypokalemia    Hypothyroidism    Leg pain    a. 08/2020 ABIs: R 1.19, L 0.97.   Mixed incontinence    MRSA infection    a.) remote history; posterior torso   Multiple thyroid  nodules    On apixaban  therapy    Peripheral edema    Permanent atrial fibrillation (HCC) 10/2016   a.) CHA2DS2VASc = 6 (age x2, sex, HTN, vascular disease history, T2DM);  b.) s/p DCCV 01/20/2017 (150 J x 1) --> failed; c.) rate/rhythm maintained on oral diltiazem ; chronically anticoagulated with apixaban    RBBB (right bundle branch block)    Recurrent UTI (urinary tract infection)    Sepsis (HCC)    Unsteady gait    Varicose veins of both lower extremities    Vitamin B12 deficiency    Past Surgical History:  Procedure Laterality Date   BONE BIOPSY Right 2018   tibia   BREAST BIOPSY Right 02/26/2021   ffirm bx-X clip-HIGH-GRADE DUCTAL CARCINOMA IN SITU   BREAST LUMPECTOMY Right 03/09/2021   Procedure: BREAST LUMPECTOMY;  Surgeon: Dessa Reyes ORN, MD;  Location: [facility];  Service: General;  Laterality: Right;   CARDIOVERSION N/A 01/20/2017   Procedure: CARDIOVERSION;  Surgeon: Evalene JINNY Lunger, MD;  Location: [facility];  Service: Cardiovascular;  Laterality: N/A;   CATARACT EXTRACTION W/PHACO Right 02/15/2018   Procedure: CATARACT EXTRACTION PHACO AND INTRAOCULAR LENS PLACEMENT (IOC);  Surgeon: Jaye Fallow, MD;  Location: [facility];  Service: Ophthalmology;  Laterality: Right;  US  01:22.8 AP% 18.9 CDE 15.62 Fluid Pack Lot # [id number]   CATARACT EXTRACTION W/PHACO Left 03/08/2018   Procedure: CATARACT EXTRACTION PHACO AND INTRAOCULAR LENS PLACEMENT (IOC);  Surgeon: Jaye Fallow, MD;  Location: [facility];  Service:  Ophthalmology;  Laterality: Left;  US  01:05 AP% 15.8 CDE 10.28 Fluid pak lot # [id number]   COLONOSCOPY     TOTAL LAPAROSCOPIC HYSTERECTOMY WITH BILATERAL SALPINGO OOPHORECTOMY N/A 07/27/2023   Procedure: TOTAL LAPAROSCOPIC HYSTERECTOMY WITH BILATERAL SALPINGO OOPHORECTOMY, SENTINEL LYMPH NODE INJECTION AND MAPPING, NODE DISSECTION;  Surgeon: Mancil Barter, MD;  Location: [facility];  Service: Gynecology;  Laterality: N/A;   Family History  Problem Relation Age of Onset   Breast cancer Sister 32   Stroke Sister    Atrial fibrillation Sister    Heart disease Brother    Ovarian cancer Maternal Aunt    Colon cancer Maternal Aunt    Kidney cancer Neg Hx    Bladder Cancer Neg Hx    Social History   Socioeconomic History   Marital status: Widowed    Spouse name: Not on file   Number of children: Not on file   Years of education: Not on file   Highest education level: Not on file  Occupational History   Not on file  Tobacco Use   Smoking status: Never   Smokeless tobacco: Never  Vaping Use   Vaping status: Never Used  Substance and Sexual Activity   Alcohol use: No   Drug use: No   Sexual activity: Not Currently  Other Topics Concern   Not on file  Social History Narrative   Lives by self; son lives about 2 miles. Never smoked; no alcohol. Used to [employer]: Low Risk  (07/20/2023)   Received from [facility]   Overall Financial Resource Strain (CARDIA)    Difficulty of Paying Living Expenses: Not hard at all  Food Insecurity: No Food Insecurity (07/27/2023)   Hunger Vital Sign    Worried About Running Out of Food in the Last Year: Never true    Ran Out of Food in the Last Year: Never true  Transportation Needs: No Transportation Needs (07/27/2023)   PRAPARE - Administrator, Civil Service (Medical): No    Lack of Transportation (Non-Medical): No  Physical Activity: Not on file  Stress: Not on  file  Social Connections: Not on file     Review of Systems  Constitutional:  Negative for appetite change and unexpected weight change.  HENT:  Negative for congestion and sinus pressure.   Respiratory:  Negative for cough, chest tightness and shortness of breath.   Cardiovascular:  Negative for chest pain and palpitations.  Gastrointestinal:  Negative for abdominal pain, nausea and vomiting.  Genitourinary:  Negative for difficulty urinating and dysuria.  Musculoskeletal:  Negative for joint swelling and myalgias.  Skin:  Negative for color change and rash.  Neurological:  Negative for dizziness and headaches.  Psychiatric/Behavioral:  Negative for agitation and dysphoric mood.  Objective:     BP 120/78   Pulse (!) 58   Temp (!) 97.5 F (36.4 C) (Oral)   Ht 5' 6 (1.676 m)   Wt 153 lb 12.8 oz (69.8 kg)   SpO2 92%   BMI 24.82 kg/m  Wt Readings from Last 3 Encounters:  12/22/23 153 lb 12.8 oz (69.8 kg)  11/23/23 161 lb (73 kg)  11/23/23 160 lb 15 oz (73 kg)    Physical Exam Vitals reviewed.  Constitutional:      General: She is not in acute distress.    Appearance: Normal appearance.  HENT:     Head: Normocephalic and atraumatic.     Right Ear: External ear normal.     Left Ear: External ear normal.     Mouth/Throat:     Pharynx: No oropharyngeal exudate or posterior oropharyngeal erythema.  Eyes:     General: No scleral icterus.       Right eye: No discharge.        Left eye: No discharge.     Conjunctiva/sclera: Conjunctivae normal.  Neck:     Thyroid : No thyromegaly.  Cardiovascular:     Rate and Rhythm: Normal rate and regular rhythm.  Pulmonary:     Effort: No respiratory distress.     Breath sounds: Normal breath sounds. No wheezing.  Abdominal:     General: Bowel sounds are normal.     Palpations: Abdomen is soft.     Tenderness: There is no abdominal tenderness.  Musculoskeletal:        General: No swelling or tenderness.     Cervical  back: Neck supple. No tenderness.  Lymphadenopathy:     Cervical: No cervical adenopathy.  Skin:    Findings: No erythema or rash.  Neurological:     Mental Status: She is alert.  Psychiatric:        Mood and Affect: Mood normal.        Behavior: Behavior normal.      Outpatient Encounter Medications as of 12/22/2023  Medication Sig   acetaminophen  (TYLENOL ) 500 MG tablet Take 500 mg by mouth 3 (three) times daily.   apixaban  (ELIQUIS ) 5 MG TABS tablet Take 1 tablet (5 mg total) by mouth 2 (two) times daily.   CRANBERRY PO Take 1 capsule by mouth 2 (two) times daily.    cyanocobalamin  (,VITAMIN B-12,) 1000 MCG/ML injection Inject 1,000 mcg into the muscle every 30 (thirty) days.    diltiazem  (CARDIZEM  CD) 180 MG 24 hr capsule Take 1 capsule (180 mg total) by mouth 2 (two) times daily.   docusate sodium  (COLACE) 100 MG capsule Take 1 capsule (100 mg total) by mouth 2 (two) times daily. To keep stools soft   furosemide  (LASIX ) 20 MG tablet Take 1 tablet (20 mg total) by mouth daily as needed for fluid or edema (SOB).   LACTOBACILLUS PO Take 1 tablet by mouth daily at 6 (six) AM.   metFORMIN  (GLUCOPHAGE ) 500 MG tablet Take 500 mg by mouth 2 (two) times daily with a meal.   olmesartan  (BENICAR ) 40 MG tablet Take 1 tablet (40 mg total) by mouth daily.   omeprazole  (PRILOSEC) 20 MG capsule Take 1 capsule (20 mg total) by mouth daily. (Patient taking differently: Take 20 mg by mouth every morning.)   ondansetron  (ZOFRAN ) 8 MG tablet Take 1 tablet (8 mg total) by mouth every 8 (eight) hours as needed for nausea or vomiting.   Trolamine Salicylate (BLUE-EMU HEMP EX) Apply 1 application  topically daily as needed (Knee pain).   [DISCONTINUED] ondansetron  (ZOFRAN ) 4 MG tablet Take 4 mg by mouth every 8 (eight) hours as needed. (Patient not taking: Reported on 12/22/2023)   [EXPIRED] cyanocobalamin  (VITAMIN B12) injection 1,000 mcg    No facility-administered encounter medications on file as of  12/22/2023.     Lab Results  Component Value Date   WBC 5.5 12/22/2023   HGB 12.6 12/22/2023   HCT 37.7 12/22/2023   PLT 284.0 12/22/2023   GLUCOSE 100 (H) 12/22/2023   CHOL 218 (H) 12/22/2023   TRIG 88.0 12/22/2023   HDL 56.40 12/22/2023   LDLCALC 144 (H) 12/22/2023   ALT 10 12/22/2023   AST 13 12/22/2023   NA 139 12/22/2023   K 3.4 (L) 12/22/2023   CL 101 12/22/2023   CREATININE 1.16 12/22/2023   BUN 20 12/22/2023   CO2 27 12/22/2023   TSH 1.46 12/22/2023   INR 1.38 01/13/2017   HGBA1C 6.4 12/22/2023      Assessment & Plan:  Type 2 diabetes mellitus with stage 3 chronic kidney disease, without long-term current use of insulin , unspecified whether stage 3a or 3b CKD (HCC) Assessment & Plan: Low carb diet and exercise. On metformin . Follow met b and A1c.   Orders: -     Basic metabolic panel -     Hemoglobin A1c  Vitamin B 12 deficiency Assessment & Plan: Due injection. Given today. Check B12 level.   Orders: -     Vitamin B12 -     Cyanocobalamin   Paroxysmal atrial fibrillation (HCC) Assessment & Plan: Rate controlled.  On eliquis . Continues in diltiazem . Follow. Sees cardiology.   Orders: -     TSH  Multinodular goiter (nontoxic) Assessment & Plan: Has seen endocrinology. Followed with previous ultrasounds. Follow TFTs.   Orders: -     TSH  Hyperlipemia, mixed Assessment & Plan: Low cholesterol diet and exercise. Follow lipid panel.   Orders: -     Hepatic function panel -     Lipid panel  Anemia, unspecified type Assessment & Plan: Check cbc and b12 level today with labs.   Orders: -     CBC with Differential/Platelet -     IBC + Ferritin  Non-Hodgkin's lymphoma, unspecified body region, unspecified non-Hodgkin lymphoma type [facility]) Assessment & Plan: Treated at [facility].     Gastroesophageal reflux disease without esophagitis Assessment & Plan: Controlled on omeprazole .    Essential hypertension Assessment & Plan: On olmesartan  and  diltiazem . Hold on making changes. Follow pressures.  Follow metabolic panel.    Ductal carcinoma in situ (DCIS) of right breast Assessment & Plan: S/p lumpectomy. Followed by oncology. Mammogram 02/25/23 - birads I.       Allena Hamilton, MD

## 2023-12-23 LAB — HEPATIC FUNCTION PANEL
ALT: 10 U/L (ref 0–35)
AST: 13 U/L (ref 0–37)
Albumin: 4.1 g/dL (ref 3.5–5.2)
Alkaline Phosphatase: 117 U/L (ref 39–117)
Bilirubin, Direct: 0.1 mg/dL (ref 0.0–0.3)
Total Bilirubin: 0.5 mg/dL (ref 0.2–1.2)
Total Protein: 6.5 g/dL (ref 6.0–8.3)

## 2023-12-23 LAB — BASIC METABOLIC PANEL
BUN: 20 mg/dL (ref 6–23)
CO2: 27 meq/L (ref 19–32)
Calcium: 9.4 mg/dL (ref 8.4–10.5)
Chloride: 101 meq/L (ref 96–112)
Creatinine, Ser: 1.16 mg/dL (ref 0.40–1.20)
GFR: 42.36 mL/min — ABNORMAL LOW (ref >60.00)
Glucose, Bld: 100 mg/dL — ABNORMAL HIGH (ref 70–99)
Potassium: 3.4 meq/L — ABNORMAL LOW (ref 3.5–5.1)
Sodium: 139 meq/L (ref 135–145)

## 2023-12-23 LAB — CBC WITH DIFFERENTIAL/PLATELET
Basophils Absolute: 0 10*3/uL (ref 0.0–0.1)
Basophils Relative: 0.3 % (ref 0.0–3.0)
Eosinophils Absolute: 0 10*3/uL (ref 0.0–0.7)
Eosinophils Relative: 0.7 % (ref 0.0–5.0)
HCT: 37.7 % (ref 36.0–46.0)
Hemoglobin: 12.6 g/dL (ref 12.0–15.0)
Lymphocytes Relative: 6.2 % — ABNORMAL LOW (ref 12.0–46.0)
Lymphs Abs: 0.3 10*3/uL — ABNORMAL LOW (ref 0.7–4.0)
MCHC: 33.3 g/dL (ref 30.0–36.0)
MCV: 95.8 fL (ref 78.0–100.0)
Monocytes Absolute: 0.4 10*3/uL (ref 0.1–1.0)
Monocytes Relative: 7.2 % (ref 3.0–12.0)
Neutro Abs: 4.7 10*3/uL (ref 1.4–7.7)
Neutrophils Relative %: 85.6 % — ABNORMAL HIGH (ref 43.0–77.0)
Platelets: 284 10*3/uL (ref 150.0–400.0)
RBC: 3.94 Mil/uL (ref 3.87–5.11)
RDW: 18.5 % — ABNORMAL HIGH (ref 11.5–15.5)
WBC: 5.5 10*3/uL (ref 4.0–10.5)

## 2023-12-23 LAB — HEMOGLOBIN A1C: Hgb A1c MFr Bld: 6.4 % (ref 4.6–6.5)

## 2023-12-23 LAB — IBC + FERRITIN
Ferritin: 35.4 ng/mL (ref 10.0–291.0)
Iron: 82 ug/dL (ref 42–145)
Saturation Ratios: 29.6 % (ref 20.0–50.0)
TIBC: 277.2 ug/dL (ref 250.0–450.0)
Transferrin: 198 mg/dL — ABNORMAL LOW (ref 212.0–360.0)

## 2023-12-23 LAB — LIPID PANEL
Cholesterol: 218 mg/dL — ABNORMAL HIGH (ref 0–200)
HDL: 56.4 mg/dL (ref >39.00)
LDL Cholesterol: 144 mg/dL — ABNORMAL HIGH (ref 0–99)
NonHDL: 161.17
Total CHOL/HDL Ratio: 4
Triglycerides: 88 mg/dL (ref 0.0–149.0)
VLDL: 17.6 mg/dL (ref 0.0–40.0)

## 2023-12-23 LAB — TSH: TSH: 1.46 u[IU]/mL (ref 0.35–5.50)

## 2023-12-23 LAB — VITAMIN B12: Vitamin B-12: 196 pg/mL — ABNORMAL LOW (ref 211–911)

## 2023-12-25 ENCOUNTER — Encounter: Payer: Self-pay | Admitting: Internal Medicine

## 2023-12-25 NOTE — Assessment & Plan Note (Signed)
 Treated at [facility].

## 2023-12-25 NOTE — Assessment & Plan Note (Signed)
 Low carb diet and exercise. On metformin. Follow met b and A1c.

## 2023-12-25 NOTE — Assessment & Plan Note (Signed)
 On olmesartan and diltiazem. Hold on making changes. Follow pressures.  Follow metabolic panel.

## 2023-12-25 NOTE — Assessment & Plan Note (Signed)
 Check cbc and b12 level today with labs.

## 2023-12-25 NOTE — Assessment & Plan Note (Signed)
 Has seen endocrinology. Followed with previous ultrasounds. Follow TFTs.

## 2023-12-25 NOTE — Assessment & Plan Note (Addendum)
 S/p lumpectomy. Followed by oncology. Mammogram 02/25/23 - birads I.

## 2023-12-25 NOTE — Assessment & Plan Note (Addendum)
 Rate controlled.  On eliquis. Continues in diltiazem. Follow. Sees cardiology.

## 2023-12-25 NOTE — Assessment & Plan Note (Signed)
 Controlled on omeprazole.

## 2023-12-25 NOTE — Assessment & Plan Note (Signed)
 Low cholesterol diet and exercise.  Follow lipid panel.

## 2023-12-25 NOTE — Assessment & Plan Note (Signed)
 Due injection. Given today. Check B12 level.

## 2023-12-28 ENCOUNTER — Other Ambulatory Visit: Payer: Self-pay

## 2023-12-28 DIAGNOSIS — E876 Hypokalemia: Secondary | ICD-10-CM

## 2023-12-28 DIAGNOSIS — D72819 Decreased white blood cell count, unspecified: Secondary | ICD-10-CM

## 2023-12-29 ENCOUNTER — Other Ambulatory Visit: Payer: Self-pay | Admitting: General Surgery

## 2023-12-29 DIAGNOSIS — Z1231 Encounter for screening mammogram for malignant neoplasm of breast: Secondary | ICD-10-CM

## 2023-12-31 ENCOUNTER — Encounter: Payer: Self-pay | Admitting: Internal Medicine

## 2023-12-31 DIAGNOSIS — Z1231 Encounter for screening mammogram for malignant neoplasm of breast: Secondary | ICD-10-CM

## 2024-01-23 ENCOUNTER — Ambulatory Visit (INDEPENDENT_AMBULATORY_CARE_PROVIDER_SITE_OTHER): Payer: Medicare Other | Admitting: *Deleted

## 2024-01-23 DIAGNOSIS — E876 Hypokalemia: Secondary | ICD-10-CM

## 2024-01-23 DIAGNOSIS — E538 Deficiency of other specified B group vitamins: Secondary | ICD-10-CM

## 2024-01-23 DIAGNOSIS — D72819 Decreased white blood cell count, unspecified: Secondary | ICD-10-CM

## 2024-01-23 LAB — CBC WITH DIFFERENTIAL/PLATELET
Basophils Absolute: 0 10*3/uL (ref 0.0–0.1)
Basophils Relative: 0.5 % (ref 0.0–3.0)
Eosinophils Absolute: 0.1 10*3/uL (ref 0.0–0.7)
Eosinophils Relative: 2.3 % (ref 0.0–5.0)
HCT: 36 % (ref 36.0–46.0)
Hemoglobin: 12 g/dL (ref 12.0–15.0)
Lymphocytes Relative: 7 % — ABNORMAL LOW (ref 12.0–46.0)
Lymphs Abs: 0.3 10*3/uL — ABNORMAL LOW (ref 0.7–4.0)
MCHC: 33.2 g/dL (ref 30.0–36.0)
MCV: 99.5 fL (ref 78.0–100.0)
Monocytes Absolute: 0.3 10*3/uL (ref 0.1–1.0)
Monocytes Relative: 7.5 % (ref 3.0–12.0)
Neutro Abs: 3.8 10*3/uL (ref 1.4–7.7)
Neutrophils Relative %: 82.7 % — ABNORMAL HIGH (ref 43.0–77.0)
Platelets: 254 10*3/uL (ref 150.0–400.0)
RBC: 3.62 Mil/uL — ABNORMAL LOW (ref 3.87–5.11)
RDW: 15.6 % — ABNORMAL HIGH (ref 11.5–15.5)
WBC: 4.6 10*3/uL (ref 4.0–10.5)

## 2024-01-23 LAB — POTASSIUM: Potassium: 3.5 meq/L (ref 3.5–5.1)

## 2024-01-23 MED ORDER — CYANOCOBALAMIN 1000 MCG/ML IJ SOLN
1000.0000 ug | Freq: Once | INTRAMUSCULAR | Status: AC
  Administered 2024-01-23: 1000 ug via INTRAMUSCULAR

## 2024-01-23 NOTE — Progress Notes (Signed)
 Patient Identified with two identifiers.Patient presented for B 12 injection to left deltoid, patient voiced no concerns nor showed any signs of distress during injection

## 2024-01-30 ENCOUNTER — Telehealth: Payer: Self-pay | Admitting: Internal Medicine

## 2024-01-30 NOTE — Telephone Encounter (Signed)
Copied from CRM [id number]. Topic: Medicare AWV >> Jan 30, 2024 11:47 AM Payton Doughty wrote: Reason for CRM: Called LVM 01/30/2024 to schedule AWV. Please schedule office or virtual visits.  Verlee Rossetti; Care Guide Ambulatory Clinical Support [place] [facility] Direct Dial: [phone]

## 2024-02-09 LAB — HM DIABETES EYE EXAM

## 2024-02-27 ENCOUNTER — Ambulatory Visit: Payer: Medicare Other

## 2024-02-29 ENCOUNTER — Ambulatory Visit
Admission: RE | Admit: 2024-02-29 | Discharge: 2024-02-29 | Disposition: A | Discharge status: Home / Self Care | Payer: Medicare Other | Source: Ambulatory Visit | Attending: General Surgery | Admitting: General Surgery

## 2024-02-29 ENCOUNTER — Ambulatory Visit (INDEPENDENT_AMBULATORY_CARE_PROVIDER_SITE_OTHER): Payer: Medicare Other

## 2024-02-29 DIAGNOSIS — E538 Deficiency of other specified B group vitamins: Secondary | ICD-10-CM

## 2024-02-29 DIAGNOSIS — Z1231 Encounter for screening mammogram for malignant neoplasm of breast: Secondary | ICD-10-CM | POA: Insufficient documentation

## 2024-02-29 MED ORDER — CYANOCOBALAMIN 1000 MCG/ML IJ SOLN
1000.0000 ug | Freq: Once | INTRAMUSCULAR | Status: AC
Start: 2024-02-29 — End: 2024-02-29
  Administered 2024-02-29: 1000 ug via INTRAMUSCULAR

## 2024-02-29 NOTE — Progress Notes (Signed)
 Patient presented for B 12 injection to left deltoid, patient voiced no concerns nor showed any signs of distress during injection.

## 2024-03-21 ENCOUNTER — Inpatient Hospital Stay: Payer: Medicare Other

## 2024-03-28 ENCOUNTER — Ambulatory Visit: Payer: Medicare Other

## 2024-04-03 ENCOUNTER — Encounter: Payer: Self-pay | Admitting: Internal Medicine

## 2024-04-03 ENCOUNTER — Other Ambulatory Visit: Payer: Self-pay | Admitting: Internal Medicine

## 2024-04-03 ENCOUNTER — Ambulatory Visit (INDEPENDENT_AMBULATORY_CARE_PROVIDER_SITE_OTHER)

## 2024-04-03 DIAGNOSIS — E538 Deficiency of other specified B group vitamins: Secondary | ICD-10-CM | POA: Diagnosis not present

## 2024-04-03 MED ORDER — CYANOCOBALAMIN 1000 MCG/ML IJ SOLN
1000.0000 ug | Freq: Once | INTRAMUSCULAR | Status: AC
  Administered 2024-04-03: 1000 ug via INTRAMUSCULAR

## 2024-04-03 NOTE — Progress Notes (Signed)
 Patient presented for B 12 injection to left deltoid, patient voiced no concerns nor showed any signs of distress during injection.

## 2024-04-04 ENCOUNTER — Other Ambulatory Visit: Payer: Self-pay | Admitting: Cardiovascular Disease

## 2024-04-04 ENCOUNTER — Inpatient Hospital Stay: Attending: Obstetrics and Gynecology | Admitting: Obstetrics and Gynecology

## 2024-04-04 VITALS — BP 152/76 | HR 72 | Temp 98.6°F | Resp 20 | Wt 161.6 lb

## 2024-04-04 DIAGNOSIS — Z90722 Acquired absence of ovaries, bilateral: Secondary | ICD-10-CM | POA: Diagnosis not present

## 2024-04-04 DIAGNOSIS — Z08 Encounter for follow-up examination after completed treatment for malignant neoplasm: Secondary | ICD-10-CM | POA: Insufficient documentation

## 2024-04-04 DIAGNOSIS — Z8542 Personal history of malignant neoplasm of other parts of uterus: Secondary | ICD-10-CM | POA: Insufficient documentation

## 2024-04-04 DIAGNOSIS — Z923 Personal history of irradiation: Secondary | ICD-10-CM | POA: Insufficient documentation

## 2024-04-04 DIAGNOSIS — Z90710 Acquired absence of both cervix and uterus: Secondary | ICD-10-CM | POA: Diagnosis not present

## 2024-04-04 DIAGNOSIS — C541 Malignant neoplasm of endometrium: Secondary | ICD-10-CM

## 2024-04-04 DIAGNOSIS — Z9079 Acquired absence of other genital organ(s): Secondary | ICD-10-CM | POA: Insufficient documentation

## 2024-04-04 NOTE — Telephone Encounter (Signed)
 See refill request.

## 2024-04-04 NOTE — Progress Notes (Signed)
 Gynecologic Oncology Consult Visit   Referring Provider: Dr. Alvia Awkward  Chief Concern: Endometrial cancer, surveillance visit  Subjective:  Barbara Mooney is a 89 y.o. female who is seen in consultation from Dr. Alvia Awkward for grade 2 endometrial cancer, s/p TLH, BSO, SLN mapping and biopsies on 07/27/23 . Pathology showed Stage IB grade 2 adenocarcinoma with 8/10 mm invasion with LVSI and cervical stromal involvement. Bilateral SLNs with ITCs. Washings negative. Received adjuvant pelvic RT and vaginal brachy.    No new complaints.  Finished vaginal brachytherapy about a month ago.     Gyn Oncology History Patient experienced PMB which prompted ultrasound.   - Uterus: 8.39 x 4.41 x 6.02 cm  - Ovaries: bilaterally wnl  - Other: 27 mm endometrial lining, 2.8 cm posterior fibroid. The uterus had a maximal endometrial thickness of 27 mm. The endometrial contour appeared irregular and generally thickened. Endometrial findings were unable to be fully examined due to the large, solid mass .   05/25/23- Pap NILM  She underwent endometrial biopsy.  WELL DIFFERENTIATED ENDOMETRIOID CARCINOMA (FIGO I).  - Loss of MLH1 and PMS2. MSH2 and MSH6 intact.    She had possible rectal prolapse on exam and was referred to GI. She takes Eliquis  for history of a fib.   She has a history of Breast DCIS and Non Hodgkins Lymphoma.   # Right lower inner biopsy- high grade DCIS, lobular neoplasia, calcifications. She underwent lumpectomy with high grade dcis, PLCIS, lobular neoplasia. Followed by radiation x 3 weeks with boost.    # Right LE Non-hodgkin follicular lymphoma s/p surgery at [facility] and RT with Dr Jacalyn Martin in 2018.  She has had radiation to the right lower extremity.  Genetic Testing: 08/26/21- [facility]ae Multi-cancer 84 gene panel was negative.   07/27/23 TLH, BSO, SLN mapping and biopsies. Pathology showed 8/10 mm invasion with LVSI and cervical stromal involvement. Bilateral SLNs with ITCs.  Washings  negative.   No new complaints today.  Incisions healing slowly.   Diagnosis 1. Lymph node, sentinel, biopsy, Left external iliac - ONE LYMPH NODE WITH ISOLATED TUMOR CELLS. - PANCYTOKERATIN STAIN CONFIRMS. 2. Lymph node, sentinel, biopsy, Right external iliac - ONE LYMPH NODE WITH ISOLATED TUMOR CELLS. - PANCYTOKERATIN STAIN CONFIRMS. 3. Uterus +/- tubes/ovaries, neoplastic - ENDOMETRIOID CARCINOMA WITH MICROCYSTIC, ELONGATED AND FRAGMENTED (MELF) PATTERN OF INVASION. - CARCINOMA INVADES STROMAL CONNECTIVE TISSUE OF CERVIX. - SEE CANCER SUMMARY BELOW. - BACKGROUND ATROPHIC ENDOMETRIUM WITH ATYPICAL ENDOMETRIAL HYPERPLASIA/EIN. - MYOMETRIUM WITH LEIOMYOMATA, LARGEST MEASURING 1.2 CM. - RIGHT PARATUBAL CYST. - BILATERAL FALLOPIAN TUBES WITH FIMBRIATED END. - BILATERAL OVARIES WITH NO PATHOLOGIC ABNORMALITY. Microscopic Comment 3. CASE SUMMARY: (ENDOMETRIUM) Standard(s): AJCC-UICC 8, FIGO Cancer Report 2023 SPECIMEN Procedure: Total hysterectomy and bilateral salpingo-oophorectomy TUMOR Histologic Type: Endometrioid carcinoma, NOS Histologic Grade: FIGO grade 2 Myometrial Invasion: Present Depth of invasion (millimeters): 8 mm Myometrial thickness (millimeters): 10 mm Percentage of myometrial invasion: 80%   Microscopic Comment(continued) Uterine Serosa Involvement: Not identified Lower Uterine Segment Involvement: Present, myoinvasive Cervical Stromal Involvement: Present Other Tissue/Organ Involvement: Not applicable Lymphatic and/or Vascular Invasion: Present  MLH1: LOSS OF NUCLEAR EXPRESSION MSH2: Preserved nuclear expression MSH6: LOSS OF NUCLEAR EXPRESSION PMS2: LOSS OF NUCLEAR EXPRESSION Methylated MLH1 alleles present. TP53 WT  Personal and family history of malignancy.  Negative [employer]ae 84 gene germline extended panel test 8/24.   Last colonoscopy in 05/2014- results not available Mammogram 02/25/23- Bi-Rads category 2: benign  Problem List: Patient Active  Problem List   Diagnosis Date Noted  Endometrial cancer, grade I (HCC) 07/27/2023   Counseling and coordination of care 08/26/2021   Family history of breast cancer 07/21/2021   Family history of ovarian cancer 07/21/2021   Family history of colon cancer 07/21/2021   Family history of Hodgkin's disease 07/21/2021   Hyperlipemia, mixed 07/21/2021   Osteopenia 07/21/2021   Acquired trigger finger 06/11/2021   Ductal carcinoma in situ (DCIS) of right breast 03/05/2021   Bradycardia 02/01/2019   Closed Colles' fracture 09/23/2018   Follicular non-Hodgkin's lymphoma of bone (HCC) 11/16/2017   Tibial mass 10/13/2017   Malignant neoplasm of right tibia (HCC) 10/07/2017   Type 2 diabetes mellitus with complication, with long-term current use of insulin (HCC) 08/07/2017   Complete tear of right rotator cuff 05/16/2017   Rotator cuff tendinitis, right 05/16/2017   Encounter for anticoagulation discussion and counseling 02/08/2017   Leg swelling 02/08/2017   Non-Hodgkin lymphoma (HCC) 2018   Unsteady gait    Anemia    Paroxysmal atrial fibrillation (HCC)    Essential hypertension    Sepsis (HCC) 10/28/2016   Degenerative tear of lateral meniscus, left 08/09/2016   Primary osteoarthritis of left knee 08/09/2016   Varicose veins of both legs with edema 03/17/2016   Type 2 diabetes mellitus with stage 3 chronic kidney disease, without long-term current use of insulin (HCC) 01/27/2016   Mixed incontinence 06/11/2015   External hemorrhoid 03/11/2015   Gastroesophageal reflux disease without esophagitis 03/11/2015   Onychomycosis of toenail 03/11/2015   Knee strain, right, subsequent encounter 02/28/2015   Primary osteoarthritis of right knee 02/28/2015   Vitamin B 12 deficiency 06/13/2014   Multinodular goiter (nontoxic) 09/07/2011    Past Medical History: Past Medical History:  Diagnosis Date   Anemia    Aortic atherosclerosis (HCC)    Arthritis    Bradycardia    Bruises easily     Chronic kidney disease (CKD), stage III (moderate) (HCC)    DM (diabetes mellitus), type 2 (HCC)    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   a.) stage 0 (high grade cTis (DCIS), cN0, cM0) with comedonecrosis -- > s/p lumpectomy + XRT   Endometrial cancer (HCC) 05/25/2023   a.) pathology (+) for well differentiated endometroid carcinoma (FIGO 1)   Family history of breast cancer    Family history of colon cancer    Family history of Hodgkin's disease    Family history of ovarian cancer    Follicular non-Hodgkin's lymphoma (HCC) 2018   a.) 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 RVSP 37.17mmHg. Mod dil LA. Mild to mod MR/TR. Mild AoV sclerosis; c.) TTE 07/08/2023: EF 60-65%, mild RVE, sev BAE, mod MAC, mild-mod TR, mild MR, RVSP 30.5   History of hiatal hernia    HLD (hyperlipidemia)    HOH (hard of hearing)    Hypertension    Hypokalemia    Hypothyroidism    Leg pain    a. 08/2020 ABIs: R 1.19, L 0.97.   Mixed incontinence    MRSA infection    a.) remote history; posterior torso   Multiple thyroid  nodules    On apixaban  therapy    Peripheral edema  Permanent atrial fibrillation (HCC) 10/2016   a.) CHA2DS2VASc = 6 (age x2, sex, HTN, vascular disease history, T2DM);  b.) s/p DCCV 01/20/2017 (150 J x 1) --> failed; c.) rate/rhythm maintained on oral diltiazem ; chronically anticoagulated with apixaban    RBBB (right bundle branch block)    Recurrent UTI (urinary tract infection)    Sepsis (HCC)    Unsteady gait    Varicose veins of both lower extremities    Vitamin B12 deficiency     Past Surgical History: Past Surgical History:  Procedure Laterality  Date   BONE BIOPSY Right 2018   tibia   BREAST BIOPSY Right 02/26/2021   ffirm bx-"X" clip-HIGH-GRADE DUCTAL CARCINOMA IN SITU   BREAST LUMPECTOMY Right 03/09/2021   Procedure: BREAST LUMPECTOMY;  Surgeon: Marshall Skeeter, MD;  Location: [facility] OR[facility];  Service: General;  Laterality: Right;   CARDIOVERSION N/A 01/20/2017   Procedure: CARDIOVERSION;  Surgeon: Devorah Fonder, MD;  Location: [facility];  Service: Cardiovascular;  Laterality: N/A;   CATARACT EXTRACTION W/PHACO Right 02/15/2018   Procedure: CATARACT EXTRACTION PHACO AND INTRAOCULAR LENS PLACEMENT (IOC);  Surgeon: Clair Crews, MD;  Location: [facility];  Service: Ophthalmology;  Laterality: Right;  US  01:22.8 AP% 18.9 CDE 15.62 Fluid Pack Lot # [id number]   CATARACT EXTRACTION W/PHACO Left 03/08/2018   Procedure: CATARACT EXTRACTION PHACO AND INTRAOCULAR LENS PLACEMENT (IOC);  Surgeon: Clair Crews, MD;  Location: [facility];  Service: Ophthalmology;  Laterality: Left;  US  01:05 AP% 15.8 CDE 10.28 Fluid pak lot # [id number]   COLONOSCOPY     TOTAL LAPAROSCOPIC HYSTERECTOMY WITH BILATERAL SALPINGO OOPHORECTOMY N/A 07/27/2023   Procedure: TOTAL LAPAROSCOPIC HYSTERECTOMY WITH BILATERAL SALPINGO OOPHORECTOMY, SENTINEL LYMPH NODE INJECTION AND MAPPING, NODE DISSECTION;  Surgeon: Hermine Loots, MD;  Location: [facility];  Service: Gynecology;  Laterality: N/A;    Past Gynecologic History:  Per HPI  OB History:  OB History  Gravida Para Term Preterm AB Living  2     2  SAB IAB Ectopic Multiple Live Births          # Outcome Date GA Lbr Len/2nd Weight Sex Type Anes PTL Lv  2 Gravida           1 Gravida             Family History: Family History  Problem Relation Age of Onset   Breast cancer Sister 67   Stroke Sister    Atrial fibrillation Sister    Heart disease Brother    Ovarian cancer Maternal Aunt    Colon cancer Maternal Aunt    Kidney cancer Neg Hx    Bladder Cancer Neg Hx     Social History: Social  History   Socioeconomic History   Marital status: Widowed    Spouse name: Not on file   Number of children: Not on file   Years of education: Not on file   Highest education level: Not on file  Occupational History   Not on file  Tobacco Use   Smoking status: Never   Smokeless tobacco: Never  Vaping Use   Vaping status: Never Used  Substance and Sexual Activity   Alcohol use: No   Drug use: No   Sexual activity: Not Currently  Other Topics Concern   Not on file  Social History Narrative   Lives by self; son lives about 2 miles. Never smoked; no alcohol. Used to [employer]: Low Risk  (07/20/2023)  Received from [facility]   Overall Financial Resource Strain (CARDIA)    Difficulty of Paying Living Expenses: Not hard at all  Food Insecurity: No Food Insecurity (07/27/2023)   Hunger Vital Sign    Worried About Running Out of Food in the Last Year: Never true    Ran Out of Food in the Last Year: Never true  Transportation Needs: No Transportation Needs (07/27/2023)   PRAPARE - Administrator, Civil Service (Medical): No    Lack of Transportation (Non-Medical): No  Physical Activity: Not on file  Stress: Not on file  Social Connections: Not on file  Intimate Partner Violence: Not At Risk (07/27/2023)   Humiliation, Afraid, Rape, and Kick questionnaire    Fear of Current or Ex-Partner: No    Emotionally Abused: No    Physically Abused: No    Sexually Abused: No    Allergies: Allergies  Allergen Reactions   Nitrofurantoin  Diarrhea, Nausea Only and Other (See Comments)   Sulfa Antibiotics Nausea Only and Other (See Comments)    Current Medications: Current Outpatient Medications  Medication Sig Dispense Refill   acetaminophen  (TYLENOL ) 500 MG tablet Take 500 mg by mouth 3 (three) times daily.     apixaban  (ELIQUIS ) 5 MG TABS tablet Take 1 tablet (5 mg total) by mouth 2 (two) times daily. 180  tablet 3   CRANBERRY PO Take 1 capsule by mouth 2 (two) times daily.      cyanocobalamin  (,VITAMIN B-12,) 1000 MCG/ML injection Inject 1,000 mcg into the muscle every 30 (thirty) days.      diltiazem  (CARDIZEM  CD) 180 MG 24 hr capsule Take 1 capsule (180 mg total) by mouth 2 (two) times daily. 180 capsule 3   furosemide  (LASIX ) 20 MG tablet Take 1 tablet (20 mg total) by mouth daily as needed for fluid or edema (SOB). 90 tablet 3   gabapentin (NEURONTIN) 100 MG capsule Take 100 mg by mouth at bedtime as needed.     LACTOBACILLUS PO Take 1 tablet by mouth daily at 6 (six) AM.     metFORMIN  (GLUCOPHAGE ) 500 MG tablet Take 500 mg by mouth 2 (two) times daily with a meal.     olmesartan  (BENICAR ) 40 MG tablet Take 1 tablet (40 mg total) by mouth daily. 90 tablet 3   omeprazole  (PRILOSEC) 20 MG capsule Take 1 capsule (20 mg total) by mouth daily. (Patient taking differently: Take 20 mg by mouth every morning.) 30 capsule 6   ondansetron  (ZOFRAN ) 8 MG tablet Take 1 tablet (8 mg total) by mouth every 8 (eight) hours as needed for nausea or vomiting. 20 tablet 1   Trolamine Salicylate (BLUE-EMU HEMP EX) Apply 1 application topically daily as needed (Knee pain).     docusate sodium  (COLACE) 100 MG capsule Take 1 capsule (100 mg total) by mouth 2 (two) times daily. To keep stools soft 30 capsule 0   No current facility-administered medications for this visit.   Review of Systems General:  fatigue and generalized weakness Skin: no complaints Eyes: no complaints HEENT: no complaints Breasts: no complaints Pulmonary: no complaints Cardiac: chronic leg swelling Gastrointestinal: occasional nausea and diarrhea Genitourinary/Sexual: no complaints Ob/Gyn: no complaints Musculoskeletal: back pain Hematology: no complaints Neurologic/Psych: no complaints   Objective:  Physical Examination:  BP (!) 152/76   Pulse 72   Temp 98.6 F (37 C)   Resp 20   Wt 161 lb 9.6 oz (73.3 kg)   SpO2 100%   BMI  26.08 kg/m    ECOG Performance Status: 0 - Asymptomatic  GENERAL: Patient is a well appearing female in no acute distress HEENT:  Sclera clear. Anicteric NODES:  Negative axillary, supraclavicular, inguinal lymph node survery LUNGS:  Clear to auscultation bilaterally.   HEART:  Regular rate and rhythm.  ABDOMEN:  Soft, nontender.  No hernias, incisions well healed. No masses or ascites EXTREMITIES:  No peripheral edema. Atraumatic. No cyanosis SKIN:  Clear with no obvious rashes or skin changes.  NEURO:  Nonfocal. Well oriented.  Appropriate affect.  Pelvic: Exam chaperoned by NP EGBUS/Vagina: normal, cuff healed well. No radiation reaction or necrosis. Bimanual: normal.  Lab Review No labs on site today.  Radiologic Imaging: No imaging on site.     Assessment:  Barbara Mooney is a 89 y.o. female diagnosed with PMB diagnosed 6/24 with Stage IIIC1 grade 1 endometrioid endometrial cancer with deficient mismatch repair protein expression (Loss of MLH1 and PMS2 with methylated MLH1 alleles) on endometrial biopsy.  In 07/27/23 underwent TLH, BSO, SLN mapping and biopsies.  Pathology showed Stage IB grade 2 adenocarcinoma with 8/10 mm invasion with LVSI and cervical stromal involvement. Bilateral SLNs with ITCs. Washings negative. Received adjuvant pelvic RT and vaginal brachy completed 10/28/23. NED since.   MLH1: LOSS OF NUCLEAR EXPRESSION MSH2: Preserved nuclear expression MSH6: LOSS OF NUCLEAR EXPRESSION PMS2: LOSS OF NUCLEAR EXPRESSION Methylated MLH1 alleles present cw sporadic uterine cancer.  TP53 WT  Personal and family history of malignancy.  Negative [employer]ae 84 gene germline extended panel test 8/24.   Medical co-morbidities complicating care: diabetes. Non-Hodgkin lymphoma and breast cancer (DCIS) status post radiation therapy, Hypothyroidism, a fib- on anticoagulation.  Plan:   Problem List Items Addressed This Visit       Genitourinary   Endometrial cancer, grade  I (HCC) - Primary   Other Visit Diagnoses       Encounter for follow-up surveillance of endometrial cancer           We reviewed NCCN surveillance guidelines including exams with pelvic exam every 3-4 months for first 2 years then every 6 months until year 5 then annually thereafter. Today, we discussed symptoms that would be concerning for recurrence and warrant sooner return. She can move out her appointment with Dr Jacalyn Martin to August 2025 and we will plan to see her back in 8 months.   Kenney Peacemaker, DNP, AGNP-C, AOCNP [facility] [phone] (clinic)  I personally interviewed and examined the patient. Agreed with the above/below plan of care. I have directly contributed to assessment and plan of care of this patient and educated and discussed with patient and family.   Hermine Loots, MD  CC: Dr. Alvia Awkward, Dr Jacalyn Martin

## 2024-04-04 NOTE — Telephone Encounter (Signed)
 Pt sent my chart message about this requesting refill. New pt to us . Ok to refill under your name?

## 2024-04-06 ENCOUNTER — Encounter: Payer: Self-pay | Admitting: Cardiovascular Disease

## 2024-04-10 ENCOUNTER — Encounter: Payer: Self-pay | Admitting: Internal Medicine

## 2024-04-10 ENCOUNTER — Telehealth: Payer: Self-pay | Admitting: Cardiovascular Disease

## 2024-04-10 DIAGNOSIS — M545 Low back pain, unspecified: Secondary | ICD-10-CM | POA: Insufficient documentation

## 2024-04-10 NOTE — Telephone Encounter (Signed)
   Pre-operative Risk Assessment    Patient Name: Barbara  B Mooney  DOB: 1934-02-27 MRN: [id number]   Date of last office visit: 10/18/23 Dr Jerelene Monday Date of next office visit: N/A   Request for Surgical Clearance    Procedure:   Lumbar epidural steroid injection  Date of Surgery:  Clearance TBD                                Surgeon:  N/A Surgeon's Group or Practice Name:  [facility] Phone number:  [phone] Fax number:  [phone]   Type of Clearance Requested:   - Pharmacy:  Hold Apixaban  (Eliquis ) discontinue 3 days prior and resume 24 hours after   Type of Anesthesia:   without sedation   Additional requests/questions:    Augustine Led   04/10/2024, 1:59 PM

## 2024-04-11 ENCOUNTER — Encounter: Payer: Self-pay | Admitting: Internal Medicine

## 2024-04-11 DIAGNOSIS — E782 Mixed hyperlipidemia: Secondary | ICD-10-CM

## 2024-04-11 DIAGNOSIS — E538 Deficiency of other specified B group vitamins: Secondary | ICD-10-CM

## 2024-04-11 DIAGNOSIS — E1122 Type 2 diabetes mellitus with diabetic chronic kidney disease: Secondary | ICD-10-CM

## 2024-04-11 DIAGNOSIS — E559 Vitamin D deficiency, unspecified: Secondary | ICD-10-CM

## 2024-04-11 DIAGNOSIS — D649 Anemia, unspecified: Secondary | ICD-10-CM

## 2024-04-11 DIAGNOSIS — I10 Essential (primary) hypertension: Secondary | ICD-10-CM

## 2024-04-11 NOTE — Telephone Encounter (Signed)
 Orders placed for labs - including vitamin D

## 2024-04-12 ENCOUNTER — Encounter: Payer: Self-pay | Admitting: Cardiovascular Disease

## 2024-04-12 NOTE — Telephone Encounter (Signed)
   Patient Name: Barbara Mooney  DOB: 04/18/1934 MRN: [id number]  Primary Cardiologist: Belva Boyden, MD  Clinical pharmacists have reviewed the patient's past medical history, labs, and current medications as part of preoperative protocol coverage. The following recommendations have been made:  Procedure:   Lumbar epidural steroid injection Date of Surgery:  Clearance TBD   CHA2DS2-VASc Score = 5   This indicates a 7.2% annual risk of stroke. The patient's score is based upon: CHF History: 0 HTN History: 1 Diabetes History: 1 Stroke History: 0 Vascular Disease History: 0 Age Score: 2 Gender Score: 1 CrCl 40 Platelet count 254 Patient has not had an Afib/aflutter ablation within the last 3 months or DCCV within the last 30 days   Per office protocol, patient can hold Eliquis  for 3 days prior to procedure.   Patient will not need bridging with Lovenox  (enoxaparin ) around procedure.  I will route this recommendation to the requesting party via Epic fax function and remove from pre-op pool.  Please call with questions.  Barbara Trimm D Malaysha Arlen, NP 04/12/2024, 9:55 AM

## 2024-04-12 NOTE — Telephone Encounter (Signed)
 Patient with diagnosis of atrial fibrillation on Eliquis  for anticoagulation.    Procedure:   Lumbar epidural steroid injection   Date of Surgery:  Clearance TBD    CHA2DS2-VASc Score = 5   This indicates a 7.2% annual risk of stroke. The patient's score is based upon: CHF History: 0 HTN History: 1 Diabetes History: 1 Stroke History: 0 Vascular Disease History: 0 Age Score: 2 Gender Score: 1  CrCl 40 Platelet count 254  Patient has not had an Afib/aflutter ablation within the last 3 months or DCCV within the last 30 days  Per office protocol, patient can hold Eliquis  for 3 days prior to procedure.   Patient will not need bridging with Lovenox  (enoxaparin ) around procedure.  **This guidance is not considered finalized until pre-operative APP has relayed final recommendations.**

## 2024-04-19 ENCOUNTER — Other Ambulatory Visit: Payer: Medicare Other

## 2024-04-19 DIAGNOSIS — I10 Essential (primary) hypertension: Secondary | ICD-10-CM | POA: Diagnosis not present

## 2024-04-19 DIAGNOSIS — E782 Mixed hyperlipidemia: Secondary | ICD-10-CM | POA: Diagnosis not present

## 2024-04-19 DIAGNOSIS — N183 Chronic kidney disease, stage 3 unspecified: Secondary | ICD-10-CM

## 2024-04-19 DIAGNOSIS — D649 Anemia, unspecified: Secondary | ICD-10-CM | POA: Diagnosis not present

## 2024-04-19 DIAGNOSIS — E538 Deficiency of other specified B group vitamins: Secondary | ICD-10-CM

## 2024-04-19 DIAGNOSIS — E1122 Type 2 diabetes mellitus with diabetic chronic kidney disease: Secondary | ICD-10-CM | POA: Diagnosis not present

## 2024-04-19 DIAGNOSIS — E559 Vitamin D deficiency, unspecified: Secondary | ICD-10-CM | POA: Diagnosis not present

## 2024-04-19 LAB — LIPID PANEL
Cholesterol: 212 mg/dL — ABNORMAL HIGH (ref 0–200)
HDL: 59.1 mg/dL (ref >39.00)
LDL Cholesterol: 137 mg/dL — ABNORMAL HIGH (ref 0–99)
NonHDL: 153.32
Total CHOL/HDL Ratio: 4
Triglycerides: 84 mg/dL (ref 0.0–149.0)
VLDL: 16.8 mg/dL (ref 0.0–40.0)

## 2024-04-19 LAB — CBC WITH DIFFERENTIAL/PLATELET
Basophils Absolute: 0 10*3/uL (ref 0.0–0.1)
Basophils Relative: 0.3 % (ref 0.0–3.0)
Eosinophils Absolute: 0.1 10*3/uL (ref 0.0–0.7)
Eosinophils Relative: 1.2 % (ref 0.0–5.0)
HCT: 38.6 % (ref 36.0–46.0)
Hemoglobin: 12.7 g/dL (ref 12.0–15.0)
Lymphocytes Relative: 6.3 % — ABNORMAL LOW (ref 12.0–46.0)
Lymphs Abs: 0.4 10*3/uL — ABNORMAL LOW (ref 0.7–4.0)
MCHC: 33 g/dL (ref 30.0–36.0)
MCV: 94.4 fl (ref 78.0–100.0)
Monocytes Absolute: 0.4 10*3/uL (ref 0.1–1.0)
Monocytes Relative: 7.4 % (ref 3.0–12.0)
Neutro Abs: 5 10*3/uL (ref 1.4–7.7)
Neutrophils Relative %: 84.8 % — ABNORMAL HIGH (ref 43.0–77.0)
Platelets: 254 10*3/uL (ref 150.0–400.0)
RBC: 4.09 Mil/uL (ref 3.87–5.11)
RDW: 14.3 % (ref 11.5–15.5)
WBC: 5.9 10*3/uL (ref 4.0–10.5)

## 2024-04-19 LAB — BASIC METABOLIC PANEL WITH GFR
BUN: 23 mg/dL (ref 6–23)
CO2: 30 meq/L (ref 19–32)
Calcium: 9.4 mg/dL (ref 8.4–10.5)
Chloride: 101 meq/L (ref 96–112)
Creatinine, Ser: 1.03 mg/dL (ref 0.40–1.20)
GFR: 48.74 mL/min — ABNORMAL LOW (ref >60.00)
Glucose, Bld: 130 mg/dL — ABNORMAL HIGH (ref 70–99)
Potassium: 4.2 meq/L (ref 3.5–5.1)
Sodium: 139 meq/L (ref 135–145)

## 2024-04-19 LAB — HEPATIC FUNCTION PANEL
ALT: 16 U/L (ref 0–35)
AST: 13 U/L (ref 0–37)
Albumin: 4 g/dL (ref 3.5–5.2)
Alkaline Phosphatase: 100 U/L (ref 39–117)
Bilirubin, Direct: 0.1 mg/dL (ref 0.0–0.3)
Total Bilirubin: 0.4 mg/dL (ref 0.2–1.2)
Total Protein: 6.6 g/dL (ref 6.0–8.3)

## 2024-04-19 LAB — VITAMIN D 25 HYDROXY (VIT D DEFICIENCY, FRACTURES): VITD: 13.19 ng/mL — ABNORMAL LOW (ref 30.00–100.00)

## 2024-04-19 LAB — VITAMIN B12: Vitamin B-12: 341 pg/mL (ref 211–911)

## 2024-04-19 LAB — HEMOGLOBIN A1C: Hgb A1c MFr Bld: 6.8 % — ABNORMAL HIGH (ref 4.6–6.5)

## 2024-04-24 ENCOUNTER — Encounter: Payer: Self-pay | Admitting: Internal Medicine

## 2024-04-24 ENCOUNTER — Ambulatory Visit: Payer: Medicare Other

## 2024-04-24 ENCOUNTER — Ambulatory Visit: Payer: Medicare Other | Admitting: Internal Medicine

## 2024-04-24 VITALS — BP 110/70 | HR 74 | Temp 97.9°F | Ht 65.0 in | Wt 162.5 lb

## 2024-04-24 DIAGNOSIS — E782 Mixed hyperlipidemia: Secondary | ICD-10-CM

## 2024-04-24 DIAGNOSIS — K219 Gastro-esophageal reflux disease without esophagitis: Secondary | ICD-10-CM

## 2024-04-24 DIAGNOSIS — R42 Dizziness and giddiness: Secondary | ICD-10-CM | POA: Diagnosis not present

## 2024-04-24 DIAGNOSIS — D649 Anemia, unspecified: Secondary | ICD-10-CM

## 2024-04-24 DIAGNOSIS — M48061 Spinal stenosis, lumbar region without neurogenic claudication: Secondary | ICD-10-CM

## 2024-04-24 DIAGNOSIS — I10 Essential (primary) hypertension: Secondary | ICD-10-CM | POA: Diagnosis not present

## 2024-04-24 DIAGNOSIS — E559 Vitamin D deficiency, unspecified: Secondary | ICD-10-CM

## 2024-04-24 DIAGNOSIS — D0511 Intraductal carcinoma in situ of right breast: Secondary | ICD-10-CM

## 2024-04-24 DIAGNOSIS — E042 Nontoxic multinodular goiter: Secondary | ICD-10-CM

## 2024-04-24 DIAGNOSIS — I480 Paroxysmal atrial fibrillation: Secondary | ICD-10-CM

## 2024-04-24 DIAGNOSIS — C8590 Non-Hodgkin lymphoma, unspecified, unspecified site: Secondary | ICD-10-CM

## 2024-04-24 DIAGNOSIS — E1122 Type 2 diabetes mellitus with diabetic chronic kidney disease: Secondary | ICD-10-CM

## 2024-04-24 DIAGNOSIS — N1831 Chronic kidney disease, stage 3a: Secondary | ICD-10-CM

## 2024-04-24 DIAGNOSIS — Z7984 Long term (current) use of oral hypoglycemic drugs: Secondary | ICD-10-CM

## 2024-04-24 DIAGNOSIS — C541 Malignant neoplasm of endometrium: Secondary | ICD-10-CM

## 2024-04-24 MED ORDER — VITAMIN D (ERGOCALCIFEROL) 1.25 MG (50000 UNIT) PO CAPS: 50000.0000 [IU] | ORAL_CAPSULE | ORAL | 0 refills | Status: DC

## 2024-04-24 NOTE — Progress Notes (Unsigned)
 Subjective:    Patient ID: Barbara  B Mooney, female    DOB: [birth year], 89 y.o.   MRN: [id number]  Patient here for  Chief Complaint  Patient presents with  . Annual Exam    HPI Here for a physical exam. Seeing ortho. 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 Prescribed steroid dose pak. Also planning injection. Had f/u with oncology 04/04/24 - stable. Recommended f/u with Dr Jacalyn Martin in 07/2024 and f/u with oncology in 8 months. Also saw Dr Charmel Cooter 04/03/24 - stable. Continue bilateral screening mammogram. Continues on metformin  for her diabetes. Remains on eliquis  and diltiazem .    Past Medical History:  Diagnosis Date  . Anemia   . Aortic atherosclerosis (HCC)   . Arthritis   . Bradycardia   . Bruises easily   . Chronic kidney disease (CKD), stage III (moderate) (HCC)   . DM (diabetes mellitus), type 2 (HCC)   . 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 Endometrial cancer (HCC) 05/25/2023   a.) pathology (+) for well differentiated endometroid carcinoma (FIGO 1)  . Family history of breast cancer   . Family history of colon cancer   . Family history of Hodgkin's disease   . Family history of ovarian cancer   . Follicular non-Hodgkin's lymphoma (HCC) 2018   a.) 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 of the RIGHT proximal tibia s/p XRT  . GERD (gastroesophageal reflux disease)   . History of bilateral cataract extraction 02/2018  . History of echocardiogram    a.) TTE 10/31/2016: EF 55-60%, mild LVH, mild MAC, mod LAE, triv AR, mild TR; b.)  TTE 08/21/2020: EF 60-65%, no RWMAs. RVSP 37.56mmHg. Mod dil LA. Mild to mod MR/TR. Mild AoV sclerosis; c.) TTE 07/08/2023: EF 60-65%, mild RVE, sev BAE, mod MAC, mild-mod TR, mild MR, RVSP 30.5  . History of hiatal hernia   . HLD  (hyperlipidemia)   . HOH (hard of hearing)   . Hypertension   . Hypokalemia   . Hypothyroidism   . Leg pain    a. 08/2020 ABIs: R 1.19, L 0.97.  Aaron Aas Mixed incontinence   . MRSA infection    a.) remote history; posterior torso  . Multiple thyroid  nodules   . On apixaban  therapy   . Peripheral edema   . Permanent atrial fibrillation (HCC) 10/2016   a.) CHA2DS2VASc = 6 (age x2, sex, HTN, vascular disease history, T2DM);  b.) s/p DCCV 01/20/2017 (150 J x 1) --> failed; c.) rate/rhythm maintained on oral diltiazem ; chronically anticoagulated with apixaban   . RBBB (right bundle branch block)   . Recurrent UTI (urinary tract infection)   . Sepsis (HCC)   . Unsteady gait   . Varicose veins of both lower extremities   . Vitamin B12 deficiency    Past Surgical History:  Procedure Laterality Date  . BONE BIOPSY Right 2018   tibia  . BREAST BIOPSY Right 02/26/2021   ffirm bx-"X" clip-HIGH-GRADE DUCTAL CARCINOMA IN SITU  . BREAST LUMPECTOMY Right 03/09/2021   Procedure: BREAST LUMPECTOMY;  Surgeon: Marshall Skeeter, MD;  Location: [facility];  Service: General;  Laterality: Right;  . CARDIOVERSION N/A 01/20/2017   Procedure: CARDIOVERSION;  Surgeon: Devorah Fonder, MD;  Location: [facility];  Service: Cardiovascular;  Laterality: N/A;  . CATARACT EXTRACTION W/PHACO  Right 02/15/2018   Procedure: CATARACT EXTRACTION PHACO AND INTRAOCULAR LENS PLACEMENT (IOC);  Surgeon: Clair Crews, MD;  Location: [facility];  Service: Ophthalmology;  Laterality: Right;  US  01:22.8 AP% 18.9 CDE 15.62 Fluid Pack Lot # [id number]  . CATARACT EXTRACTION W/PHACO Left 03/08/2018   Procedure: CATARACT EXTRACTION PHACO AND INTRAOCULAR LENS PLACEMENT (IOC);  Surgeon: Clair Crews, MD;  Location: [facility];  Service: Ophthalmology;  Laterality: Left;  US  01:05 AP% 15.8 CDE 10.28 Fluid pak lot # [id number]  . COLONOSCOPY    . TOTAL LAPAROSCOPIC HYSTERECTOMY WITH BILATERAL SALPINGO OOPHORECTOMY N/A 07/27/2023    Procedure: TOTAL LAPAROSCOPIC HYSTERECTOMY WITH BILATERAL SALPINGO OOPHORECTOMY, SENTINEL LYMPH NODE INJECTION AND MAPPING, NODE DISSECTION;  Surgeon: Hermine Loots, MD;  Location: [facility];  Service: Gynecology;  Laterality: N/A;   Family History  Problem Relation Age of Onset  . Breast cancer Sister 10  . Stroke Sister   . Atrial fibrillation Sister   . Heart disease Brother   . Ovarian cancer Maternal Aunt   . Colon cancer Maternal Aunt   . Kidney cancer Neg Hx   . Bladder Cancer Neg Hx    Social History   Socioeconomic History  . Marital status: Widowed    Spouse name: Not on file  . Number of children: Not on file  . Years of education: Not on file  . Highest education level: 12th grade  Occupational History  . Not on file  Tobacco Use  . Smoking status: Never  . Smokeless tobacco: Never  Vaping Use  . Vaping status: Never Used  Substance and Sexual Activity  . Alcohol use: No  . Drug use: No  . Sexual activity: Not Currently  Other Topics Concern  . Not on file  Social History Narrative   Lives by self; son lives about 2 miles. Never smoked; no alcohol. Used to [employer]: Low Risk  (04/20/2024)   Overall Financial Resource Strain (CARDIA)   . Difficulty of Paying Living Expenses: Not hard at all  Food Insecurity: No Food Insecurity (04/20/2024)   Hunger Vital Sign   . Worried About Programme researcher, broadcasting/film/video in the Last Year: Never true   . Ran Out of Food in the Last Year: Never true  Transportation Needs: No Transportation Needs (04/20/2024)   PRAPARE - Transportation   . Lack of Transportation (Medical): No   . Lack of Transportation (Non-Medical): No  Physical Activity: Unknown (04/20/2024)   Exercise Vital Sign   . Days of Exercise per Week: 0 days   . Minutes of Exercise per Session: Not on file  Stress: No Stress Concern Present (04/20/2024)   [employer] of Occupational Health - Occupational Stress  Questionnaire   . Feeling of Stress : Not at all  Social Connections: Moderately Integrated (04/20/2024)   Social Connection and Isolation Panel [NHANES]   . Frequency of Communication with Friends and Family: More than three times a week   . Frequency of Social Gatherings with Friends and Family: Twice a week   . Attends Religious Services: More than 4 times per year   . Active Member of Clubs or Organizations: Yes   . Attends Banker Meetings: More than 4 times per year   . Marital Status: Widowed     Review of Systems     Objective:     BP 110/70   Pulse 74   Temp 97.9 F (36.6 C) (Oral)  Ht 5\' 5"  (1.651 m)   Wt 162 lb 8 oz (73.7 kg)   SpO2 97%   BMI 27.04 kg/m  Wt Readings from Last 3 Encounters:  04/24/24 162 lb 8 oz (73.7 kg)  04/04/24 161 lb 9.6 oz (73.3 kg)  12/22/23 153 lb 12.8 oz (69.8 kg)    Physical Exam  {Perform Simple Foot Exam  Perform Detailed exam:1} {Insert foot Exam (Optional):30965}   Outpatient Encounter Medications as of 04/24/2024  Medication Sig  . acetaminophen  (TYLENOL ) 500 MG tablet Take 500 mg by mouth 3 (three) times daily.  . apixaban  (ELIQUIS ) 5 MG TABS tablet Take 1 tablet (5 mg total) by mouth 2 (two) times daily.  Aaron Aas CRANBERRY PO Take 1 capsule by mouth 2 (two) times daily.   . cyanocobalamin  (,VITAMIN B-12,) 1000 MCG/ML injection Inject 1,000 mcg into the muscle every 30 (thirty) days.   . diltiazem  (CARDIZEM  CD) 180 MG 24 hr capsule Take 1 capsule (180 mg total) by mouth 2 (two) times daily.  . furosemide  (LASIX ) 20 MG tablet Take 1 tablet (20 mg total) by mouth daily as needed for fluid or edema (SOB).  Aaron Aas gabapentin (NEURONTIN) 100 MG capsule Take 100 mg by mouth at bedtime as needed.  Aaron Aas LACTOBACILLUS PO Take 1 tablet by mouth daily at 6 (six) AM.  . metFORMIN  (GLUCOPHAGE ) 500 MG tablet Take 500 mg by mouth 2 (two) times daily with a meal.  . olmesartan  (BENICAR ) 40 MG tablet TAKE 1 TABLET BY MOUTH EVERY DAY  .  omeprazole  (PRILOSEC) 20 MG capsule TAKE 1 CAPSULE BY MOUTH EVERY DAY  . ondansetron  (ZOFRAN ) 8 MG tablet Take 1 tablet (8 mg total) by mouth every 8 (eight) hours as needed for nausea or vomiting.  . Trolamine Salicylate (BLUE-EMU HEMP EX) Apply 1 application topically daily as needed (Knee pain).  . [DISCONTINUED] docusate sodium  (COLACE) 100 MG capsule Take 1 capsule (100 mg total) by mouth 2 (two) times daily. To keep stools soft   No facility-administered encounter medications on file as of 04/24/2024.     Lab Results  Component Value Date   WBC 5.9 04/19/2024   HGB 12.7 04/19/2024   HCT 38.6 04/19/2024   PLT 254.0 04/19/2024   GLUCOSE 130 (H) 04/19/2024   CHOL 212 (H) 04/19/2024   TRIG 84.0 04/19/2024   HDL 59.10 04/19/2024   LDLCALC 137 (H) 04/19/2024   ALT 16 04/19/2024   AST 13 04/19/2024   NA 139 04/19/2024   K 4.2 04/19/2024   CL 101 04/19/2024   CREATININE 1.03 04/19/2024   BUN 23 04/19/2024   CO2 30 04/19/2024   TSH 1.46 12/22/2023   INR 1.38 01/13/2017   HGBA1C 6.8 (H) 04/19/2024    MM 3D SCREENING MAMMOGRAM BILATERAL BREAST Result Date: 03/02/2024 CLINICAL DATA:  Screening. EXAM: DIGITAL SCREENING BILATERAL MAMMOGRAM WITH TOMOSYNTHESIS AND CAD TECHNIQUE: Bilateral screening digital craniocaudal and mediolateral oblique mammograms were obtained. Bilateral screening digital breast tomosynthesis was performed. The images were evaluated with computer-aided detection. COMPARISON:  Previous exam(s). ACR Breast Density Category c: The breasts are heterogeneously dense, which may obscure small masses. FINDINGS: There are no findings suspicious for malignancy. Stable postsurgical changes in the right breast. IMPRESSION: No mammographic evidence of malignancy. A result letter of this screening mammogram will be mailed directly to the patient. RECOMMENDATION: Screening mammogram in one year. (Code:[id number]-B-01Y) BI-RADS CATEGORY  2: Benign. Electronically Signed   By: Dobrinka   Dimitrova M.D.   On: 03/02/2024 09:08  Assessment & Plan:  Dizziness -     EKG 12-Lead     Dellar Fenton, MD

## 2024-04-25 ENCOUNTER — Ambulatory Visit: Payer: Self-pay | Admitting: Internal Medicine

## 2024-04-25 ENCOUNTER — Encounter: Payer: Self-pay | Admitting: Internal Medicine

## 2024-04-25 DIAGNOSIS — M4800 Spinal stenosis, site unspecified: Secondary | ICD-10-CM | POA: Insufficient documentation

## 2024-04-25 DIAGNOSIS — E559 Vitamin D deficiency, unspecified: Secondary | ICD-10-CM | POA: Insufficient documentation

## 2024-04-25 NOTE — Assessment & Plan Note (Addendum)
 Low carb diet and exercise. On metformin . Follow met b and A1c. No changes today. Follow renal function. A1c just checked 6.8.

## 2024-04-25 NOTE — Assessment & Plan Note (Signed)
 Hgb 12.7 recent check. Follow

## 2024-04-25 NOTE — Assessment & Plan Note (Signed)
 Described light headedness as outlined. Started this am. Has intermittent episodes as outlined. Feels similar to previous episodes. Not orthostatic on exam. EKG - afib with RBBB - ventricular rate 74. No acute change. Reports feeling better. Discussed further w/up, especially given this is a persistent intermittent issue for her. She wants to hold on further testing.  Rest. Stay hydrated. Eat regular meals. Follow.  Call with update this pm.

## 2024-04-25 NOTE — Assessment & Plan Note (Signed)
 Treated at [facility].

## 2024-04-25 NOTE — Assessment & Plan Note (Signed)
 Upper symptoms appear to be controlled. Continue PPI.

## 2024-04-25 NOTE — Assessment & Plan Note (Signed)
 Has seen endocrinology. Followed with previous ultrasounds. Follow thyroid  function

## 2024-04-25 NOTE — Assessment & Plan Note (Signed)
 Seeing oncology for continued surveillance.  Last evaluated 11/2023.

## 2024-04-25 NOTE — Assessment & Plan Note (Signed)
 Rate controlled.  On eliquis . Continues in diltiazem . EKG afib with RBBB. Stable.

## 2024-04-25 NOTE — Assessment & Plan Note (Signed)
 Low cholesterol diet and exercise as tolerated. Per note, has desired not to start cholesterol medication. Follow lipid panel.   Lab Results  Component Value Date   CHOL 212 (H) 04/19/2024   HDL 59.10 04/19/2024   LDLCALC 137 (H) 04/19/2024   TRIG 84.0 04/19/2024   CHOLHDL 4 04/19/2024

## 2024-04-25 NOTE — Assessment & Plan Note (Signed)
 Recent lab - low vitamin d . Start ergocalciferol  50,000 units q weeks x 12 weeks and then continue vitamin D3 1000 units q day. Follow vitamin d  level.

## 2024-04-25 NOTE — Assessment & Plan Note (Signed)
 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 Prescribed steroid dose pak. Also planning injection tomorrow.  Call with update this pm.  If persistent dizziness, will need to hold on procedure.

## 2024-04-25 NOTE — Assessment & Plan Note (Addendum)
 S/p lumpectomy. Followed by Dr Maida Sciara. Just saw Dr Charmel Cooter as outlined. Mammogram 03/02/24 - birads II.  Plan to get follow up mammograms through PCP.

## 2024-04-25 NOTE — Assessment & Plan Note (Signed)
 On olmesartan  and diltiazem . Hold on making changes. Follow pressures.  Not orthostatic on exam. GFR 48. Follow.

## 2024-05-01 ENCOUNTER — Encounter: Payer: Self-pay | Admitting: Cardiovascular Disease

## 2024-05-09 ENCOUNTER — Ambulatory Visit

## 2024-05-10 ENCOUNTER — Ambulatory Visit (INDEPENDENT_AMBULATORY_CARE_PROVIDER_SITE_OTHER)

## 2024-05-10 DIAGNOSIS — E538 Deficiency of other specified B group vitamins: Secondary | ICD-10-CM | POA: Diagnosis not present

## 2024-05-10 MED ORDER — CYANOCOBALAMIN 1000 MCG/ML IJ SOLN
1000.0000 ug | Freq: Once | INTRAMUSCULAR | Status: AC
  Administered 2024-05-10: 1000 ug via INTRAMUSCULAR

## 2024-05-10 NOTE — Progress Notes (Signed)
 Patient presented for B 12 injection to left deltoid due to pt having breast cancer on the right side, patient voiced no concerns nor showed any signs of distress during injection.

## 2024-05-21 ENCOUNTER — Encounter: Payer: Self-pay | Admitting: Internal Medicine

## 2024-05-22 ENCOUNTER — Other Ambulatory Visit: Payer: Self-pay

## 2024-05-22 ENCOUNTER — Other Ambulatory Visit

## 2024-05-22 DIAGNOSIS — R8290 Unspecified abnormal findings in urine: Secondary | ICD-10-CM

## 2024-05-22 NOTE — Telephone Encounter (Signed)
 See me about work in spot.

## 2024-05-22 NOTE — Telephone Encounter (Signed)
 Daughter is going to bring urine this PM and do virtual visit tomorrow at 12:30. Urine orders placed.

## 2024-05-22 NOTE — Telephone Encounter (Signed)
 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

## 2024-05-23 ENCOUNTER — Telehealth (INDEPENDENT_AMBULATORY_CARE_PROVIDER_SITE_OTHER): Admitting: Internal Medicine

## 2024-05-23 ENCOUNTER — Encounter: Payer: Self-pay | Admitting: Internal Medicine

## 2024-05-23 ENCOUNTER — Ambulatory Visit: Payer: Self-pay | Admitting: Internal Medicine

## 2024-05-23 DIAGNOSIS — E1122 Type 2 diabetes mellitus with diabetic chronic kidney disease: Secondary | ICD-10-CM | POA: Diagnosis not present

## 2024-05-23 DIAGNOSIS — R8290 Unspecified abnormal findings in urine: Secondary | ICD-10-CM

## 2024-05-23 DIAGNOSIS — Z7984 Long term (current) use of oral hypoglycemic drugs: Secondary | ICD-10-CM

## 2024-05-23 DIAGNOSIS — N1831 Chronic kidney disease, stage 3a: Secondary | ICD-10-CM

## 2024-05-23 DIAGNOSIS — I10 Essential (primary) hypertension: Secondary | ICD-10-CM | POA: Diagnosis not present

## 2024-05-23 LAB — URINALYSIS, ROUTINE W REFLEX MICROSCOPIC
Bilirubin, UA: NEGATIVE
Glucose, UA: NEGATIVE
Ketones, UA: NEGATIVE
Nitrite, UA: POSITIVE — AB
Protein,UA: NEGATIVE
RBC, UA: NEGATIVE
Specific Gravity, UA: 1.014 (ref 1.005–1.030)
Urobilinogen, Ur: 0.2 mg/dL (ref 0.2–1.0)
pH, UA: 6.5 (ref 5.0–7.5)

## 2024-05-23 LAB — MICROSCOPIC EXAMINATION
Casts: NONE SEEN /LPF
RBC, Urine: NONE SEEN /HPF (ref 0–2)
WBC, UA: >30 /HPF — AB (ref 0–5)

## 2024-05-23 MED ORDER — CEFDINIR 300 MG PO CAPS: 300.0000 mg | ORAL_CAPSULE | Freq: Two times a day (BID) | ORAL | 0 refills | Status: DC

## 2024-05-23 NOTE — Progress Notes (Signed)
 Patient ID: Barbara Mooney, female   DOB: 12-30-1933, 89 y.o.   MRN: [id number]   Virtual Visit via video Note  I connected with Barbara Mooney by a video enabled telemedicine application and verified that I am speaking with the correct person using two identifiers. Location patient: home Location provider: work  Persons participating in the virtual visit: patient, provider and pts daughter Leary Provencal.   The limitations, risks, security and privacy concerns of performing an evaluation and management service by video and the availability of in person appointments have been discussed. It has also been discussed with the patient that there may be a patient responsible charge related to this service. The patient expressed understanding and agreed to proceed.   Reason for visit: work in appt.   HPI: Work in with concerns regarding UTI. Reports some increased urinary urgency - is not new, but has noticed a change in odor of urine. No blood in the urine reported. No dysuria. Using heating pad for her back. Not new pain. Also applying ice - alternating. Taking tylenol  arthritis.    ROS: See pertinent positives and negatives per HPI.  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   a.) stage 0 (high grade cTis (DCIS), cN0, cM0) with comedonecrosis -- > s/p lumpectomy + XRT   Endometrial cancer (HCC) 05/25/2023   a.) pathology (+) for well differentiated endometroid carcinoma (FIGO 1)   Family history of breast cancer    Family history of colon cancer    Family history of Hodgkin's disease    Family history of ovarian cancer    Follicular non-Hodgkin's lymphoma (HCC) 2018   a.) 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 of  the RIGHT proximal tibia s/p XRT   GERD (gastroesophageal reflux disease)    History of bilateral cataract extraction 02/2018   History of echocardiogram    a.) TTE 10/31/2016: EF 55-60%, mild LVH, mild MAC, mod LAE, triv AR, mild TR; b.)  TTE 08/21/2020: EF 60-65%, no RWMAs. RVSP 37.29mmHg. Mod dil LA. Mild to mod MR/TR. Mild AoV sclerosis; c.) TTE 07/08/2023: EF 60-65%, mild RVE, sev BAE, mod MAC, mild-mod TR, mild MR, RVSP 30.5   History of hiatal hernia    HLD (hyperlipidemia)    HOH (hard of hearing)    Hypertension    Hypokalemia    Hypothyroidism    Leg pain    a. 08/2020 ABIs: R 1.19, L 0.97.   Mixed incontinence    MRSA infection    a.) remote history; posterior torso   Multiple thyroid  nodules    On apixaban  therapy    Peripheral edema    Permanent atrial fibrillation (HCC) 10/2016   a.) CHA2DS2VASc = 6 (age x2, sex, HTN, vascular disease history, T2DM);  b.) s/p DCCV 01/20/2017 (150 J x 1) --> failed; c.) rate/rhythm maintained on oral diltiazem ; chronically anticoagulated with apixaban    RBBB (right bundle branch block)    Recurrent UTI (urinary tract infection)    Sepsis (HCC)    Unsteady gait    Varicose veins of both lower extremities    Vitamin B12 deficiency     Past Surgical History:  Procedure Laterality Date  BONE BIOPSY Right 2018   tibia   BREAST BIOPSY Right 02/26/2021   ffirm bx-X clip-HIGH-GRADE DUCTAL CARCINOMA IN SITU   BREAST LUMPECTOMY Right 03/09/2021   Procedure: BREAST LUMPECTOMY;  Surgeon: Marshall Skeeter, MD;  Location: [facility];  Service: General;  Laterality: Right;   CARDIOVERSION N/A 01/20/2017   Procedure: CARDIOVERSION;  Surgeon: Devorah Fonder, MD;  Location: [facility];  Service: Cardiovascular;  Laterality: N/A;   CATARACT EXTRACTION W/PHACO Right 02/15/2018   Procedure: CATARACT EXTRACTION PHACO AND INTRAOCULAR LENS PLACEMENT (IOC);  Surgeon: Clair Crews, MD;  Location: [facility];  Service: Ophthalmology;  Laterality: Right;   US  01:22.8 AP% 18.9 CDE 15.62 Fluid Pack Lot # [id number]   CATARACT EXTRACTION W/PHACO Left 03/08/2018   Procedure: CATARACT EXTRACTION PHACO AND INTRAOCULAR LENS PLACEMENT (IOC);  Surgeon: Clair Crews, MD;  Location: [facility];  Service: Ophthalmology;  Laterality: Left;  US  01:05 AP% 15.8 CDE 10.28 Fluid pak lot # [id number]   COLONOSCOPY     TOTAL LAPAROSCOPIC HYSTERECTOMY WITH BILATERAL SALPINGO OOPHORECTOMY N/A 07/27/2023   Procedure: TOTAL LAPAROSCOPIC HYSTERECTOMY WITH BILATERAL SALPINGO OOPHORECTOMY, SENTINEL LYMPH NODE INJECTION AND MAPPING, NODE DISSECTION;  Surgeon: Hermine Loots, MD;  Location: [facility];  Service: Gynecology;  Laterality: N/A;    Family History  Problem Relation Age of Onset   Breast cancer Sister 18   Stroke Sister    Atrial fibrillation Sister    Heart disease Brother    Ovarian cancer Maternal Aunt    Colon cancer Maternal Aunt    Kidney cancer Neg Hx    Bladder Cancer Neg Hx     SOCIAL HX: reviewed.    Current Outpatient Medications:    cefdinir  (OMNICEF ) 300 MG capsule, Take 1 capsule (300 mg total) by mouth 2 (two) times daily., Disp: 10 capsule, Rfl: 0   acetaminophen  (TYLENOL ) 500 MG tablet, Take 500 mg by mouth 3 (three) times daily., Disp: , Rfl:    apixaban  (ELIQUIS ) 5 MG TABS tablet, Take 1 tablet (5 mg total) by mouth 2 (two) times daily., Disp: 180 tablet, Rfl: 3   CRANBERRY PO, Take 1 capsule by mouth 2 (two) times daily. , Disp: , Rfl:    cyanocobalamin  (,VITAMIN B-12,) 1000 MCG/ML injection, Inject 1,000 mcg into the muscle every 30 (thirty) days. , Disp: , Rfl:    diltiazem  (CARDIZEM  CD) 180 MG 24 hr capsule, Take 1 capsule (180 mg total) by mouth 2 (two) times daily., Disp: 180 capsule, Rfl: 3   furosemide  (LASIX ) 20 MG tablet, Take 1 tablet (20 mg total) by mouth daily as needed for fluid or edema (SOB)., Disp: 90 tablet, Rfl: 3   gabapentin (NEURONTIN) 100 MG capsule, Take 100 mg by mouth at bedtime as needed., Disp: , Rfl:     LACTOBACILLUS PO, Take 1 tablet by mouth daily at 6 (six) AM., Disp: , Rfl:    metFORMIN  (GLUCOPHAGE ) 500 MG tablet, Take 500 mg by mouth 2 (two) times daily with a meal., Disp: , Rfl:    olmesartan  (BENICAR ) 40 MG tablet, TAKE 1 TABLET BY MOUTH EVERY DAY, Disp: 90 tablet, Rfl: 3   omeprazole  (PRILOSEC) 20 MG capsule, TAKE 1 CAPSULE BY MOUTH EVERY DAY, Disp: 90 capsule, Rfl: 1   ondansetron  (ZOFRAN ) 8 MG tablet, Take 1 tablet (8 mg total) by mouth every 8 (eight) hours as needed for nausea or vomiting., Disp: 20 tablet, Rfl: 1   Trolamine Salicylate (BLUE-EMU HEMP EX), Apply 1 application topically daily as needed (Knee pain).,  Disp: , Rfl:    Vitamin D , Ergocalciferol , (DRISDOL ) 1.25 MG (50000 UNIT) CAPS capsule, Take 1 capsule (50,000 Units total) by mouth every 7 (seven) days., Disp: 12 capsule, Rfl: 0  EXAM:  GENERAL: alert, oriented, appears well and in no acute distress  HEENT: atraumatic, conjunttiva clear, no obvious abnormalities on inspection of external nose and ears  NECK: normal movements of the head and neck  LUNGS: on inspection no signs of respiratory distress, breathing rate appears normal, no obvious gross SOB, gasping or wheezing  CV: no obvious cyanosis  PSYCH/NEURO: pleasant and cooperative, no obvious depression or anxiety, speech and thought processing grossly intact  ASSESSMENT AND PLAN:  Discussed the following assessment and plan:  Problem List Items Addressed This Visit     Abnormal urine odor - Primary   With change in odor of urine, some baseline urinary urgency, urine was checked to confirm if infection presetn. Culture pending. Urine reveals 2+ leukocytes and positive nitrite. Given symptoms and urine findings, will treat with abx - omnicef  300mg  bid x 5 days. Stay hydrated. Follow.  Will notify once culture results are available.       Essential hypertension   On olmesartan  and diltiazem . Follow pressures.       Type 2 diabetes mellitus with  stage 3 chronic kidney disease, without long-term current use of insulin (HCC)   Low carb diet and exercise. On metformin . A1c just checked 6.8.        Return for keep scheduled. .   I discussed the assessment and treatment plan with the patient. The patient was provided an opportunity to ask questions and all were answered. The patient agreed with the plan and demonstrated an understanding of the instructions.   The patient was advised to call back or seek an in-person evaluation if the symptoms worsen or if the condition fails to improve as anticipated.    Dellar Fenton, MD

## 2024-05-24 ENCOUNTER — Encounter: Payer: Self-pay | Admitting: Internal Medicine

## 2024-05-24 LAB — URINE CULTURE

## 2024-05-24 NOTE — Telephone Encounter (Signed)
 Culture is back, but the sensitivites are not back.  Recommend to take omnicef and 5 days is usual treatment.

## 2024-05-24 NOTE — Telephone Encounter (Signed)
 Her culture just came back. Just wanted to confirm 5 day course of omnicef is correct.

## 2024-05-24 NOTE — Telephone Encounter (Signed)
 Patients daughter is aware

## 2024-05-27 ENCOUNTER — Encounter: Payer: Self-pay | Admitting: Internal Medicine

## 2024-05-27 DIAGNOSIS — R8290 Unspecified abnormal findings in urine: Secondary | ICD-10-CM | POA: Insufficient documentation

## 2024-05-27 NOTE — Assessment & Plan Note (Signed)
 On olmesartan  and diltiazem . Follow pressures.

## 2024-05-27 NOTE — Assessment & Plan Note (Signed)
 Low carb diet and exercise. On metformin . A1c just checked 6.8.

## 2024-05-27 NOTE — Assessment & Plan Note (Signed)
 With change in odor of urine, some baseline urinary urgency, urine was checked to confirm if infection presetn. Culture pending. Urine reveals 2+ leukocytes and positive nitrite. Given symptoms and urine findings, will treat with abx - omnicef  300mg  bid x 5 days. Stay hydrated. Follow.  Will notify once culture results are available.

## 2024-05-29 ENCOUNTER — Encounter: Payer: Self-pay | Admitting: Internal Medicine

## 2024-05-30 NOTE — Telephone Encounter (Signed)
 Called Barbara Mooney to let her know that you are out of the office. Daughter is just wanting your input on taking lyrica. Prescribing MD told them that it was ok for her to take but she still wanted to make sure. She is going to check with pharmacy when she picks the medication up as well since it is a stronger medication. Per Barbara Mooney, IF lyrica does not help with the pain then they are deferring back to you because nothing further they can do. Confirmed with daughter that this is not something urgent that doc of day needs to handle. Patient is doing ok (baseline for her).

## 2024-05-30 NOTE — Telephone Encounter (Signed)
 Need [facility] notes. Also, need to know dose of lyrica taking and how taking. Also, is she stopping the gabapentin to stop neurontin?

## 2024-05-31 ENCOUNTER — Ambulatory Visit: Payer: Medicare Other | Admitting: Radiation Oncology

## 2024-06-01 ENCOUNTER — Encounter: Payer: Self-pay | Admitting: Cardiovascular Disease

## 2024-06-01 ENCOUNTER — Telehealth: Payer: Self-pay | Admitting: Cardiovascular Disease

## 2024-06-01 NOTE — Telephone Encounter (Signed)
 Message forwarded to Dr. Gollan and [employer], RN

## 2024-06-01 NOTE — Telephone Encounter (Signed)
 Pt c/o medication issue:  1. Name of Medication:   apixaban  (ELIQUIS ) 5 MG TABS tablet    2. How are you currently taking this medication (dosage and times per day)? As written  3. Are you having a reaction (difficulty breathing--STAT)? no  4. What is your medication issue? Daughter wants to know if she can take her off Eliquis  for a few days because pt is having such back pain and wants to give her Ibuprofen 

## 2024-06-01 NOTE — Telephone Encounter (Signed)
 She would have to be willing to accept the increased risk of stroke coming off Eliquis  for a few days - but ultimately would defer to Dr. Gollan

## 2024-06-03 ENCOUNTER — Emergency Department

## 2024-06-03 ENCOUNTER — Other Ambulatory Visit: Payer: Self-pay

## 2024-06-03 ENCOUNTER — Inpatient Hospital Stay
Admission: EM | Admit: 2024-06-03 | Discharge: 2024-06-06 | DRG: 552 | Disposition: A | Discharge status: Home Health | Attending: Internal Medicine | Admitting: Internal Medicine

## 2024-06-03 DIAGNOSIS — Z86000 Personal history of in-situ neoplasm of breast: Secondary | ICD-10-CM

## 2024-06-03 DIAGNOSIS — K219 Gastro-esophageal reflux disease without esophagitis: Secondary | ICD-10-CM | POA: Diagnosis present

## 2024-06-03 DIAGNOSIS — Z8542 Personal history of malignant neoplasm of other parts of uterus: Secondary | ICD-10-CM

## 2024-06-03 DIAGNOSIS — Z8249 Family history of ischemic heart disease and other diseases of the circulatory system: Secondary | ICD-10-CM

## 2024-06-03 DIAGNOSIS — Z9842 Cataract extraction status, left eye: Secondary | ICD-10-CM

## 2024-06-03 DIAGNOSIS — Z800 Family history of malignant neoplasm of digestive organs: Secondary | ICD-10-CM

## 2024-06-03 DIAGNOSIS — S0083XA Contusion of other part of head, initial encounter: Secondary | ICD-10-CM | POA: Diagnosis present

## 2024-06-03 DIAGNOSIS — Z881 Allergy status to other antibiotic agents status: Secondary | ICD-10-CM

## 2024-06-03 DIAGNOSIS — I4821 Permanent atrial fibrillation: Secondary | ICD-10-CM | POA: Diagnosis present

## 2024-06-03 DIAGNOSIS — M48061 Spinal stenosis, lumbar region without neurogenic claudication: Secondary | ICD-10-CM | POA: Diagnosis not present

## 2024-06-03 DIAGNOSIS — Z823 Family history of stroke: Secondary | ICD-10-CM

## 2024-06-03 DIAGNOSIS — Z79899 Other long term (current) drug therapy: Secondary | ICD-10-CM

## 2024-06-03 DIAGNOSIS — Z7984 Long term (current) use of oral hypoglycemic drugs: Secondary | ICD-10-CM

## 2024-06-03 DIAGNOSIS — M549 Dorsalgia, unspecified: Secondary | ICD-10-CM | POA: Diagnosis present

## 2024-06-03 DIAGNOSIS — Z8572 Personal history of non-Hodgkin lymphomas: Secondary | ICD-10-CM

## 2024-06-03 DIAGNOSIS — Z923 Personal history of irradiation: Secondary | ICD-10-CM

## 2024-06-03 DIAGNOSIS — I129 Hypertensive chronic kidney disease with stage 1 through stage 4 chronic kidney disease, or unspecified chronic kidney disease: Secondary | ICD-10-CM | POA: Diagnosis present

## 2024-06-03 DIAGNOSIS — Z8041 Family history of malignant neoplasm of ovary: Secondary | ICD-10-CM

## 2024-06-03 DIAGNOSIS — Z66 Do not resuscitate: Secondary | ICD-10-CM | POA: Diagnosis present

## 2024-06-03 DIAGNOSIS — Z803 Family history of malignant neoplasm of breast: Secondary | ICD-10-CM

## 2024-06-03 DIAGNOSIS — N1831 Chronic kidney disease, stage 3a: Secondary | ICD-10-CM | POA: Diagnosis present

## 2024-06-03 DIAGNOSIS — Z7901 Long term (current) use of anticoagulants: Secondary | ICD-10-CM

## 2024-06-03 DIAGNOSIS — S63501A Unspecified sprain of right wrist, initial encounter: Secondary | ICD-10-CM | POA: Diagnosis present

## 2024-06-03 DIAGNOSIS — E039 Hypothyroidism, unspecified: Secondary | ICD-10-CM | POA: Diagnosis present

## 2024-06-03 DIAGNOSIS — Z888 Allergy status to other drugs, medicaments and biological substances status: Secondary | ICD-10-CM

## 2024-06-03 DIAGNOSIS — Z882 Allergy status to sulfonamides status: Secondary | ICD-10-CM

## 2024-06-03 DIAGNOSIS — W19XXXA Unspecified fall, initial encounter: Secondary | ICD-10-CM | POA: Diagnosis present

## 2024-06-03 DIAGNOSIS — N183 Type 2 diabetes mellitus with diabetic chronic kidney disease: Secondary | ICD-10-CM | POA: Diagnosis present

## 2024-06-03 DIAGNOSIS — Z961 Presence of intraocular lens: Secondary | ICD-10-CM | POA: Diagnosis present

## 2024-06-03 DIAGNOSIS — E785 Hyperlipidemia, unspecified: Secondary | ICD-10-CM | POA: Diagnosis present

## 2024-06-03 DIAGNOSIS — Y929 Unspecified place or not applicable: Secondary | ICD-10-CM

## 2024-06-03 DIAGNOSIS — M545 Low back pain, unspecified: Principal | ICD-10-CM

## 2024-06-03 DIAGNOSIS — I10 Essential (primary) hypertension: Secondary | ICD-10-CM | POA: Diagnosis present

## 2024-06-03 DIAGNOSIS — M810 Age-related osteoporosis without current pathological fracture: Secondary | ICD-10-CM | POA: Diagnosis present

## 2024-06-03 DIAGNOSIS — E1122 Type 2 diabetes mellitus with diabetic chronic kidney disease: Secondary | ICD-10-CM | POA: Diagnosis present

## 2024-06-03 DIAGNOSIS — I480 Paroxysmal atrial fibrillation: Secondary | ICD-10-CM | POA: Diagnosis present

## 2024-06-03 DIAGNOSIS — R55 Syncope and collapse: Principal | ICD-10-CM

## 2024-06-03 DIAGNOSIS — Z9841 Cataract extraction status, right eye: Secondary | ICD-10-CM

## 2024-06-03 DIAGNOSIS — G8929 Other chronic pain: Secondary | ICD-10-CM | POA: Diagnosis present

## 2024-06-03 DIAGNOSIS — M5416 Radiculopathy, lumbar region: Secondary | ICD-10-CM | POA: Diagnosis present

## 2024-06-03 LAB — URINALYSIS, ROUTINE W REFLEX MICROSCOPIC
Bilirubin Urine: NEGATIVE
Glucose, UA: NEGATIVE mg/dL
Hgb urine dipstick: NEGATIVE
Ketones, ur: NEGATIVE mg/dL
Nitrite: NEGATIVE
Protein, ur: NEGATIVE mg/dL
Specific Gravity, Urine: 1.014 (ref 1.005–1.030)
WBC, UA: >50 WBC/hpf (ref 0–5)
pH: 5 (ref 5.0–8.0)

## 2024-06-03 LAB — GLUCOSE, CAPILLARY
Glucose-Capillary: 94 mg/dL (ref 70–99)
Glucose-Capillary: 190 mg/dL — ABNORMAL HIGH (ref 70–99)

## 2024-06-03 LAB — COMPREHENSIVE METABOLIC PANEL WITH GFR
ALT: 12 U/L (ref 0–44)
AST: 15 U/L (ref 15–41)
Albumin: 3.2 g/dL — ABNORMAL LOW (ref 3.5–5.0)
Alkaline Phosphatase: 113 U/L (ref 38–126)
Anion gap: 9 (ref 5–15)
BUN: 15 mg/dL (ref 8–23)
CO2: 25 mmol/L (ref 22–32)
Calcium: 9.3 mg/dL (ref 8.9–10.3)
Chloride: 103 mmol/L (ref 98–111)
Creatinine, Ser: 0.82 mg/dL (ref 0.44–1.00)
GFR, Estimated: >60 mL/min (ref >60)
Glucose, Bld: 160 mg/dL — ABNORMAL HIGH (ref 70–99)
Potassium: 3.7 mmol/L (ref 3.5–5.1)
Sodium: 137 mmol/L (ref 135–145)
Total Bilirubin: 0.6 mg/dL (ref 0.0–1.2)
Total Protein: 6.4 g/dL — ABNORMAL LOW (ref 6.5–8.1)

## 2024-06-03 LAB — CBC
HCT: 35.3 % — ABNORMAL LOW (ref 36.0–46.0)
Hemoglobin: 11.7 g/dL — ABNORMAL LOW (ref 12.0–15.0)
MCH: 30.8 pg (ref 26.0–34.0)
MCHC: 33.1 g/dL (ref 30.0–36.0)
MCV: 92.9 fL (ref 80.0–100.0)
Platelets: 276 10*3/uL (ref 150–400)
RBC: 3.8 MIL/uL — ABNORMAL LOW (ref 3.87–5.11)
RDW: 14.6 % (ref 11.5–15.5)
WBC: 5.7 10*3/uL (ref 4.0–10.5)
nRBC: 0 % (ref 0.0–0.2)

## 2024-06-03 LAB — TROPONIN I (HIGH SENSITIVITY): Troponin I (High Sensitivity): 8 ng/L (ref <18)

## 2024-06-03 MED ORDER — DOCUSATE SODIUM 100 MG PO CAPS
100.0000 mg | ORAL_CAPSULE | Freq: Two times a day (BID) | ORAL | Status: DC
  Administered 2024-06-03 – 2024-06-06 (×5): 100 mg via ORAL
  Filled 2024-06-03 (×5): qty 1

## 2024-06-03 MED ORDER — INSULIN ASPART 100 UNIT/ML IJ SOLN
0.0000 [IU] | Freq: Three times a day (TID) | INTRAMUSCULAR | Status: DC
  Administered 2024-06-04 – 2024-06-06 (×3): 2 [IU] via SUBCUTANEOUS
  Filled 2024-06-03 (×3): qty 1

## 2024-06-03 MED ORDER — LIDOCAINE 5 % EX PTCH
1.0000 | MEDICATED_PATCH | CUTANEOUS | Status: DC
  Administered 2024-06-03: 2 via TRANSDERMAL
  Administered 2024-06-04: 1 via TRANSDERMAL
  Administered 2024-06-05: 2 via TRANSDERMAL
  Filled 2024-06-03: qty 1
  Filled 2024-06-03 (×2): qty 2

## 2024-06-03 MED ORDER — PANTOPRAZOLE SODIUM 40 MG PO TBEC
40.0000 mg | DELAYED_RELEASE_TABLET | Freq: Every day | ORAL | Status: DC
  Administered 2024-06-03 – 2024-06-06 (×3): 40 mg via ORAL
  Filled 2024-06-03 (×3): qty 1

## 2024-06-03 MED ORDER — HYDRALAZINE HCL 20 MG/ML IJ SOLN: 5.0000 mg | INTRAMUSCULAR | Status: DC | PRN

## 2024-06-03 MED ORDER — MORPHINE SULFATE (PF) 2 MG/ML IV SOLN
2.0000 mg | INTRAVENOUS | Status: DC | PRN
  Administered 2024-06-04 – 2024-06-05 (×5): 2 mg via INTRAVENOUS
  Filled 2024-06-03 (×5): qty 1

## 2024-06-03 MED ORDER — POLYETHYLENE GLYCOL 3350 17 G PO PACK
17.0000 g | PACK | Freq: Every day | ORAL | Status: DC | PRN
  Administered 2024-06-05: 17 g via ORAL
  Filled 2024-06-03: qty 1

## 2024-06-03 MED ORDER — IRBESARTAN 150 MG PO TABS
300.0000 mg | ORAL_TABLET | Freq: Every day | ORAL | Status: DC
  Administered 2024-06-04 – 2024-06-06 (×2): 300 mg via ORAL
  Filled 2024-06-03 (×2): qty 2

## 2024-06-03 MED ORDER — OXYCODONE HCL 5 MG PO TABS
5.0000 mg | ORAL_TABLET | ORAL | Status: DC | PRN
  Administered 2024-06-03 – 2024-06-06 (×8): 5 mg via ORAL
  Filled 2024-06-03 (×9): qty 1

## 2024-06-03 MED ORDER — ACETAMINOPHEN 500 MG PO TABS
500.0000 mg | ORAL_TABLET | Freq: Three times a day (TID) | ORAL | Status: DC
  Administered 2024-06-03 – 2024-06-06 (×8): 500 mg via ORAL
  Filled 2024-06-03 (×8): qty 1

## 2024-06-03 MED ORDER — APIXABAN 5 MG PO TABS
5.0000 mg | ORAL_TABLET | Freq: Two times a day (BID) | ORAL | Status: DC
  Administered 2024-06-03 – 2024-06-04 (×2): 5 mg via ORAL
  Filled 2024-06-03 (×2): qty 1

## 2024-06-03 MED ORDER — BISACODYL 5 MG PO TBEC
5.0000 mg | DELAYED_RELEASE_TABLET | Freq: Every day | ORAL | Status: DC | PRN
  Administered 2024-06-05: 5 mg via ORAL
  Filled 2024-06-03: qty 1

## 2024-06-03 MED ORDER — METHOCARBAMOL 1000 MG/10ML IJ SOLN
500.0000 mg | Freq: Three times a day (TID) | INTRAMUSCULAR | Status: DC
  Administered 2024-06-03 – 2024-06-06 (×8): 500 mg via INTRAVENOUS
  Filled 2024-06-03 (×8): qty 10
  Filled 2024-06-03: qty 5

## 2024-06-03 MED ORDER — ONDANSETRON HCL 4 MG PO TABS: 4.0000 mg | ORAL_TABLET | Freq: Four times a day (QID) | ORAL | Status: DC | PRN

## 2024-06-03 MED ORDER — OXYCODONE HCL 5 MG PO TABS
5.0000 mg | ORAL_TABLET | Freq: Once | ORAL | Status: AC
  Administered 2024-06-03: 5 mg via ORAL
  Filled 2024-06-03: qty 1

## 2024-06-03 MED ORDER — ONDANSETRON HCL 4 MG/2ML IJ SOLN: 4.0000 mg | Freq: Four times a day (QID) | INTRAMUSCULAR | Status: DC | PRN

## 2024-06-03 MED ORDER — DILTIAZEM HCL ER COATED BEADS 180 MG PO CP24
180.0000 mg | ORAL_CAPSULE | Freq: Two times a day (BID) | ORAL | Status: DC
  Administered 2024-06-03 – 2024-06-06 (×5): 180 mg via ORAL
  Filled 2024-06-03 (×6): qty 1

## 2024-06-03 NOTE — ED Notes (Signed)
PT in [place] at this time

## 2024-06-03 NOTE — H&P (Addendum)
 History and Physical    Patient: Barbara Mooney FMW:[id number] DOB: Jan 13, 1934 DOA: 06/03/2024 DOS: the patient was seen and examined on 06/03/2024 PCP: Glendia Shad, MD  Patient coming from: Home - lives alone; NOK: Daughter, [name], [phone]   Chief Complaint: Fall  HPI: Barbara Mooney is a 89 y.o. female with medical history significant of DCIS, endometrial CA, stage 3a CKD, NHL, DM, HTN, HLD, hypothyroidism, and afib on Eliquis  who presented on 6/22 with a fall.   She had a fall last night around 1030 pm.  She may have fainted.  She is being treated by ortho with injections in her back.  She was changed from gabapentin to pregabalin Friday AM, had 4 doses, wasn't noticing side effects at bedtime.  She is having back and leg pain chronically.  She felt dizzy and next thing she knew she was lying on the flor.  She had used her potty chair, got up and felt dizzy.  She no longer feels dizzy.  Her back is so bad, feels like it has been.  She has recently been able to get OOB to chair with pain, today she could not do that.     ER Course:   ?syncope last night.  Known afib on Eliquis .  Difficulty getting around the house today, sees ortho and gets injections.  Passed out next to bed last night.  Imaging all negative.  Labs fine.     Review of Systems: As mentioned in the history of present illness. All other systems reviewed and are negative. 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   a.) stage 0 (high grade cTis (DCIS), cN0, cM0) with comedonecrosis -- > s/p lumpectomy + XRT   Endometrial cancer (HCC) 05/25/2023   a.) pathology (+) for well differentiated endometroid carcinoma (FIGO 1)   Follicular non-Hodgkin's lymphoma (HCC) 2018   a.) stage I E  follicular lymphoma consistent with B-cell germinal center origin of the RIGHT proximal tibia s/p XRT   GERD (gastroesophageal reflux disease)    History of hiatal hernia    HLD (hyperlipidemia)    HOH (hard of hearing)    Hypertension    Hypothyroidism    Mixed incontinence    MRSA infection    a.) remote history; posterior torso   Multiple thyroid  nodules    Peripheral edema    Permanent atrial fibrillation (HCC) 10/2016   a.) CHA2DS2VASc = 6 (age x2, sex, HTN, vascular disease history, T2DM);  b.) s/p DCCV 01/20/2017 (150 J x 1) --> failed; c.) rate/rhythm maintained on oral diltiazem ; chronically anticoagulated with apixaban    RBBB (right bundle branch block)    Recurrent UTI (urinary tract infection)    Unsteady gait    Varicose veins of both lower extremities    Vitamin B12 deficiency    Past Surgical History:  Procedure Laterality Date   BONE BIOPSY Right 2018   tibia   BREAST BIOPSY Right 02/26/2021   ffirm bx-X clip-HIGH-GRADE DUCTAL CARCINOMA IN SITU   BREAST LUMPECTOMY Right 03/09/2021   Procedure: BREAST LUMPECTOMY;  Surgeon: Dessa Reyes ORN, MD;  Location: [facility];  Service: General;  Laterality: Right;   CARDIOVERSION N/A 01/20/2017   Procedure: CARDIOVERSION;  Surgeon: Evalene PARAS  Perla, MD;  Location: [facility];  Service: Cardiovascular;  Laterality: N/A;   CATARACT EXTRACTION W/PHACO Right 02/15/2018   Procedure: CATARACT EXTRACTION PHACO AND INTRAOCULAR LENS PLACEMENT (IOC);  Surgeon: Jaye Fallow, MD;  Location: [facility];  Service: Ophthalmology;  Laterality: Right;  US  01:22.8 AP% 18.9 CDE 15.62 Fluid Pack Lot # [id number]   CATARACT EXTRACTION W/PHACO Left 03/08/2018   Procedure: CATARACT EXTRACTION PHACO AND INTRAOCULAR LENS PLACEMENT (IOC);  Surgeon: Jaye Fallow, MD;  Location: [facility];  Service: Ophthalmology;  Laterality: Left;  US  01:05 AP% 15.8 CDE 10.28 Fluid pak lot # [id number]   COLONOSCOPY     TOTAL LAPAROSCOPIC HYSTERECTOMY WITH  BILATERAL SALPINGO OOPHORECTOMY N/A 07/27/2023   Procedure: TOTAL LAPAROSCOPIC HYSTERECTOMY WITH BILATERAL SALPINGO OOPHORECTOMY, SENTINEL LYMPH NODE INJECTION AND MAPPING, NODE DISSECTION;  Surgeon: Mancil Barter, MD;  Location: [facility];  Service: Gynecology;  Laterality: N/A;   Social History:  reports that she has never smoked. She has never used smokeless tobacco. She reports that she does not drink alcohol and does not use drugs.  Allergies  Allergen Reactions   Nitrofurantoin  Diarrhea, Nausea Only and Other (See Comments)   Sulfa Antibiotics Nausea Only and Other (See Comments)    Family History  Problem Relation Age of Onset   Breast cancer Sister 28   Stroke Sister    Atrial fibrillation Sister    Heart disease Brother    Ovarian cancer Maternal Aunt    Colon cancer Maternal Aunt    Kidney cancer Neg Hx    Bladder Cancer Neg Hx     Prior to Admission medications   Medication Sig Start Date End Date Taking? Authorizing Provider  acetaminophen  (TYLENOL ) 500 MG tablet Take 500 mg by mouth 3 (three) times daily.    [provider]  apixaban  (ELIQUIS ) 5 MG TABS tablet Take 1 tablet (5 mg total) by mouth 2 (two) times daily. 10/18/23   Gollan, Timothy J, MD  cefdinir  (OMNICEF ) 300 MG capsule Take 1 capsule (300 mg total) by mouth 2 (two) times daily. 05/23/24   Glendia Shad, MD  CRANBERRY PO Take 1 capsule by mouth 2 (two) times daily.     [provider]  cyanocobalamin  (,VITAMIN B-12,) 1000 MCG/ML injection Inject 1,000 mcg into the muscle every 30 (thirty) days.  02/08/17   [provider]  diltiazem  (CARDIZEM  CD) 180 MG 24 hr capsule Take 1 capsule (180 mg total) by mouth 2 (two) times daily. 10/18/23   Gollan, Timothy J, MD  furosemide  (LASIX ) 20 MG tablet Take 1 tablet (20 mg total) by mouth daily as needed for fluid or edema (SOB). 10/18/23   Gollan, Timothy J, MD  gabapentin (NEURONTIN) 100 MG capsule Take 100 mg by mouth at bedtime as needed.  03/06/24   [provider]  LACTOBACILLUS PO Take 1 tablet by mouth daily at 6 (six) AM.    [provider]  metFORMIN  (GLUCOPHAGE ) 500 MG tablet Take 500 mg by mouth 2 (two) times daily with a meal.    [provider]  olmesartan  (BENICAR ) 40 MG tablet TAKE 1 TABLET BY MOUTH EVERY DAY 04/04/24   Gollan, Timothy J, MD  omeprazole  (PRILOSEC) 20 MG capsule TAKE 1 CAPSULE BY MOUTH EVERY DAY 04/04/24   Glendia Shad, MD  ondansetron  (ZOFRAN ) 8 MG tablet Take 1 tablet (8 mg total) by mouth every 8 (eight) hours as needed for nausea or vomiting. 12/05/23   Lenn Aran, MD  Trolamine Salicylate (BLUE-EMU HEMP EX) Apply 1  application topically daily as needed (Knee pain).    [provider]  Vitamin D , Ergocalciferol , (DRISDOL ) 1.25 MG (50000 UNIT) CAPS capsule Take 1 capsule (50,000 Units total) by mouth every 7 (seven) days. 04/24/24   Glendia Shad, MD    Physical Exam: Vitals:   06/03/24 1215  BP: 134/70  Pulse: 87  Resp: 18  Temp: 98.2 F (36.8 C)  SpO2: 98%   General:  Appears calm but very uncomfortable, significant back pain even while at rest Eyes:  PERRL, EOMI, normal lids, iris ENT:  grossly normal hearing, lips & tongue, mmm Cardiovascular:  Irregularly irregular, rate controlled. No LE edema.  Respiratory:   CTA bilaterally with no wheezes/rales/rhonchi.  Normal respiratory effort. Abdomen:  soft, NT, ND Skin:  no rash or induration seen on limited exam Musculoskeletal:  L > RLE , no bony abnormality Psychiatric: blunted mood and affect, speech fluent and appropriate, AOx3 Neurologic:  CN 2-12 grossly intact, moves all extremities in coordinated fashion   Radiological Exams on Admission: Independently reviewed - see discussion in A/P where applicable  CT Lumbar Spine Wo Contrast Result Date: 06/03/2024 CLINICAL DATA:  Trauma, fall, acute on chronic lower back pain. EXAM: CT LUMBAR SPINE WITHOUT CONTRAST TECHNIQUE: Multidetector CT  imaging of the lumbar spine was performed without intravenous contrast administration. Multiplanar CT image reconstructions were also generated. RADIATION DOSE REDUCTION: This exam was performed according to the departmental dose-optimization program which includes automated exposure control, adjustment of the mA and/or kV according to patient size and/or use of iterative reconstruction technique. COMPARISON:  MRI lumbar spine 05/09/2010. FINDINGS: Segmentation: 5 lumbar type vertebrae. Alignment: Lumbar lordosis is maintained. Similar grade 1 anterolisthesis of L4 on L5. Vertebrae: No compression fracture or displaced fracture in the lumbar spine. No suspicious lytic or blastic osseous lesion. Degenerative endplate changes and endplate osteophytes most pronounced at L1-2 and L4-5. Paraspinal and other soft tissues: The paraspinal soft tissues are unremarkable. Extensive atherosclerosis of the abdominal aorta and branch vessels. Disc levels: Mild intervertebral disc space narrowing at L1-2. Severe disc space narrowing at L4-5 with associated endplate sclerosis and irregularity. Small disc bulges at multiple levels. Disc bulge, facet arthrosis, and thickening of the ligamentum flavum at L3-4 resulting in moderate spinal canal stenosis. Additional disc bulge at L4-5 along with moderate facet arthrosis and thickening of the ligamentum flavum resulting in at least moderate spinal canal stenosis likely with lateral recess narrowing. There is moderate bilateral foraminal stenosis at L4-5. IMPRESSION: No acute fracture or traumatic malalignment of the lumbar spine. Degenerative changes as above. Moderate spinal canal stenosis at L3-4 and L4-5. Foraminal stenosis greatest at L4-5. Similar grade 1 anterolisthesis of L4 on L5. Electronically Signed   By: Donnice Mania M.D.   On: 06/03/2024 13:55   DG Hand Complete Right Result Date: 06/03/2024 CLINICAL DATA:  Pain. EXAM: RIGHT HAND - COMPLETE 3 VIEW COMPARISON:  None  Available. FINDINGS: Distal interphalangeal degenerative changes with joint space narrowing and osteophytes. No acute fracture, dislocation or subluxation. No osteolytic or osteoblastic lesions. IMPRESSION: Degenerative changes. Electronically Signed   By: Fonda Field M.D.   On: 06/03/2024 13:24   CT Cervical Spine Wo Contrast Result Date: 06/03/2024 EXAM: CT CERVICAL SPINE WITHOUT CONTRAST 06/03/2024 12:50:40 PM TECHNIQUE: CT of the cervical was performed without the administration of intravenous contrast. Multiplanar reformatted images are provided for review. Automated exposure control, iterative reconstruction, and/or weight based adjustment of the mA/kV was utilized to reduce the radiation dose to as low as  reasonably achievable. COMPARISON: None available. CLINICAL HISTORY: Fall. Pt states she thinks she passed out. Pt has some back issues and had shots for that. Pt has been taking meds for leg weakness and back pain. Pt did hit her head and is on thinners. FINDINGS: CERVICAL SPINE: BONES AND ALIGNMENT: Anterior osteophytes are fused C3-6. Large overhanging anterior osteophytes are present at C6-7. Straightening of the normal cervical lordosis is present. Ossification of the posterior spinal ligament is noted, likely related to remote trauma. DEGENERATIVE CHANGES: Degenerative changes as described above. SOFT TISSUES: No prevertebral soft tissue swelling. VASCULATURE: Atherosclerotic calcifications are present in the proximal great vessels. No aneurysm or stenosis is evident. LUNGS: Patchy ground-glass attenuation is present in both lungs. IMPRESSION: 1. No acute abnormality of the cervical spine related to the fall. 2. Straightening of the normal cervical lordosis. 3. Ossification of the posterior spinal ligament, likely related to remote trauma. Electronically signed by: Lonni Necessary MD 06/03/2024 01:06 PM EDT RP Workstation: [id number]   CT Maxillofacial Wo Contrast Result Date:  06/03/2024 EXAM: CT OF THE FACE WITHOUT CONTRAST 06/03/2024 12:50:40 PM TECHNIQUE: CT of the face was performed without the administration of intravenous contrast. Multiplanar reformatted images are provided for review. Automated exposure control, iterative reconstruction, and/or weight based adjustment of the mA/kV was utilized to reduce the radiation dose to as low as reasonably achievable. COMPARISON: None available. CLINICAL HISTORY: Fall. Pt states she thinks she passed out. Pt has some back issues and had shots for that. Pt has been taking meds for leg weakness and back pain. Pt did hit her head and is on thinners. FINDINGS: FACIAL BONES: No acute facial fracture. No mandibular dislocation. No suspicious bone lesion. ORBITS: Bilateral lens replacements are noted. The globes and orbits are otherwise within normal limits. SINUSES AND MASTOIDS: A polyp or mucous retention cyst is present in the inferior left maxillary sinus and in a posterior right ethmoid air cell. SOFT TISSUES: No acute abnormality. IMPRESSION: 1. No acute facial fracture or significant soft tissue injury. Electronically signed by: Lonni Necessary MD 06/03/2024 01:03 PM EDT RP Workstation: [id number]   CT Head Wo Contrast Result Date: 06/03/2024 EXAM: CT HEAD WITHOUT CONTRAST 06/03/2024 12:50:40 PM TECHNIQUE: CT of the head was performed without the administration of intravenous contrast. Automated exposure control, iterative reconstruction, and/or weight based adjustment of the mA/kV was utilized to reduce the radiation dose to as low as reasonably achievable. COMPARISON: None available. CLINICAL HISTORY: Fall. Pt hit her head and is on blood thinners. FINDINGS: BRAIN AND VENTRICLES: No acute hemorrhage. Gray-white differentiation is preserved. No hydrocephalus. No extra-axial collection. No mass effect or midline shift. Moderate and confluent periventricular white matter hypoattenuation is more prominent anteriorly than posteriorly.  ORBITS: No acute abnormality. Inserted lens replacement. SINUSES: A polyp or mixed retention cyst is present in a posterior right ethmoid air cell. The paranasal sinuses are otherwise clear. SOFT TISSUES AND SKULL: No acute soft tissue abnormality. No skull fracture. Atherosclerotic calcifications are present in the cavernous carotid arteries bilaterally and at the dural margin of both vertebral arteries. No hyperdense vessel is present. The mastoid air cells are clear. IMPRESSION: 1. No acute intracranial abnormality related to the fall. 2. Moderate and confluent periventricular white matter hypoattenuation, more prominent anteriorly than posteriorly. This likely reflects the sequelae of chronic microvascular ischemia. Electronically signed by: Lonni Necessary MD 06/03/2024 01:01 PM EDT RP Workstation: [id number]    EKG: Independently reviewed.  Afib with rate 86; RBBB; nonspecific ST changes with no evidence  of acute ischemia   Labs on Admission: I have personally reviewed the available labs and imaging studies at the time of the admission.  Pertinent labs:    Glucose 160 Albumin 3.2 HS troponin 8 WBC 5.7 Hgb 11.7 UA: moderate LE, rare bacteria   Assessment and Plan: Principal Problem:   Intractable back pain Active Problems:   Paroxysmal atrial fibrillation (HCC)   Essential hypertension   Type 2 diabetes mellitus with stage 3 chronic kidney disease, without long-term current use of insulin (HCC)   DNR (do not resuscitate)    Intractable back pain Patient with severe chronic back pain She and her daughter report a recent MRI at [facility] (cannot visualize) She has been having difficulty with mobility despite using a walker Has had Tylenol  for pain control Previously on gabapentin, changed to Lyrica and took 4 doses prior to dizziness with standing episode last night - likely the culprit Low concern for syncope, greater concern is her immobility and uncontrolled pain Will  observe overnight for pain control Multimodal pain control with Robaxin + Tylenol  + Lidoderm  + Oxy + morphine  At time of dc, tramadol  and/or oxy is likely reasonable PT/OT consults in AM I have reached out to Dr. Claudene from neurosurgery, who has reviewed images; he recommends facet injections to see if this will provide better pain control and he will f/u as an outpatient  Stage 3a CKD Appears to be stable at this time Attempt to avoid nephrotoxic medications Recheck BMP in AM  Avoid NSAIDs if possible  DM Recent A1c 6.8, good control Hold Glucophage  Cover with moderate-scale SSI Carb modified diet   HTN Continue diltiazem , olmesartan  (irbesartan  per formulary) Hold Lasix   Afib Rate controlled with diltiazem  Continue Eliquis   DNR I have discussed code status with the patient/family and they are in agreement that the patient would not desire resuscitation and would prefer to die a natural death should that situation arise. [facility] documents reviewed Patient will need a gold out of facility DNR form at the time of discharge    Advance Care Planning:   Code Status: Limited: Do not attempt resuscitation (DNR) -DNR-LIMITED -Do Not Intubate/DNI    Consults: Neurosurgery (telephone only); PT/OT; TOC team  DVT Prophylaxis: Eliquis   Family Communication: Daughter was present throughout evaluation  Severity of Illness: The appropriate patient status for this patient is OBSERVATION. Observation status is judged to be reasonable and necessary in order to provide the required intensity of service to ensure the patient's safety. The patient's presenting symptoms, physical exam findings, and initial radiographic and laboratory data in the context of their medical condition is felt to place them at decreased risk for further clinical deterioration. Furthermore, it is anticipated that the patient will be medically stable for discharge from the hospital within 2 midnights of admission.    Author: Delon Herald, MD 06/03/2024 4:04 PM  For on call review [email or web address].

## 2024-06-03 NOTE — ED Notes (Signed)
 Pt taken to CT and they will bring to 25 after scans

## 2024-06-03 NOTE — ED Notes (Signed)
 BLUE TOP ALSO DRAWN AND SENT TO LAB

## 2024-06-03 NOTE — ED Triage Notes (Addendum)
 Pt comes with c/o fall. Pt states she thinks she passed. Pt has some back issues and had shots for that. Pt has been taking meds for leg weakness and back pain.   Pt did hit her head and is on thinners

## 2024-06-03 NOTE — ED Provider Notes (Signed)
 [facility] Provider Note    Event Date/Time   First MD Initiated Contact with Patient 06/03/24 1230     (approximate)   History   Fall  Pt comes with c/o fall. Pt states she thinks she passed. Pt has some back issues and had shots for that. Pt has been taking meds for leg weakness and back pain.   Pt did hit her head and is on thinners   HPI Barbara Mooney is a 89 y.o. female PMH paroxysmal A-fib on Eliquis , diabetes, hypertension, anemia, non-Hodgkin lymphoma presents for evaluation after fall - Collateral for by patient's family members bedside.  Appears patient had a syncopal episode yesterday evening while she was walking to bed.  Was able to crawl on the ground subsequently and get into her bed.  Family found her this morning, able to ambulate with her walker but complaining of a lot of left hand pain that prevented her from getting up out of her chair.  Recently started on pregabalin for chronic back pain.  Does complain of some worsened low back pain today as well. - No recent infectious symptoms.  Was recently treated for UTI. - Took Tylenol  for pain, has not taken other pain medications       Physical Exam   Triage Vital Signs: ED Triage Vitals [06/03/24 1215]  Encounter Vitals Group     BP 134/70     Girls Systolic BP Percentile      Girls Diastolic BP Percentile      Boys Systolic BP Percentile      Boys Diastolic BP Percentile      Pulse Rate 87     Resp 18     Temp 98.2 F (36.8 C)     Temp src      SpO2 98 %     Weight      Height      Head Circumference      Peak Flow      Pain Score      Pain Loc      Pain Education      Exclude from Growth Chart     Most recent vital signs: Vitals:   06/03/24 1215  BP: 134/70  Pulse: 87  Resp: 18  Temp: 98.2 F (36.8 C)  SpO2: 98%     General: Awake, no distress.  HEENT: Mild bruising along upper lip and left maxillary region.  Maxilla stable.  No intraoral trauma  appreciated. Neck:   Full range of motion, no midline tenderness CV:  Good peripheral perfusion. RRR, RP 2+ Resp:  Normal effort. CTAB Abd:  No distention. Nontender to deep palpation throughout Pelvis:  stable    ED Results / Procedures / Treatments   Labs (all labs ordered are listed, but only abnormal results are displayed) Labs Reviewed  COMPREHENSIVE METABOLIC PANEL WITH GFR - Abnormal; Notable for the following components:      Result Value   Glucose, Bld 160 (*)    Total Protein 6.4 (*)    Albumin 3.2 (*)    All other components within normal limits  CBC - Abnormal; Notable for the following components:   RBC 3.80 (*)    Hemoglobin 11.7 (*)    HCT 35.3 (*)    All other components within normal limits  URINALYSIS, ROUTINE W REFLEX MICROSCOPIC - Abnormal; Notable for the following components:   Color, Urine YELLOW (*)    APPearance HAZY (*)    Leukocytes,Ua  MODERATE (*)    Bacteria, UA RARE (*)    All other components within normal limits  CBG MONITORING, ED  TROPONIN I (HIGH SENSITIVITY)  TROPONIN I (HIGH SENSITIVITY)     EKG  Ecg = A-fib, rate 86, no gross ST elevation or depression, no significant repolarization abnormality, borderline left axis deviation.  Right bundle branch block present.  Otherwise normal intervals.  No clear evidence of ischemia on my interpretation.   RADIOLOGY Radiology interpreted by myself and radiology reports reviewed.  No acute pathology visualized.  PROCEDURES:  Critical Care performed: No  Procedures   MEDICATIONS ORDERED IN ED: Medications  oxyCODONE  (Oxy IR/ROXICODONE ) immediate release tablet 5 mg (5 mg Oral Given 06/03/24 1317)     IMPRESSION / MDM / ASSESSMENT AND PLAN / ED COURSE  I reviewed the triage vital signs and the nursing notes.                              DDX/MDM/AP: Differential diagnosis includes, but is not limited to, consider intracranial hemorrhage, skull fracture, C-spine fracture.  Also  consider possible right hand fracture, L-spine fracture.  Patient well-appearing here, appears to have chronic pain.  Family is very concerned about syncopal episode and if workup is otherwise unremarkable would desire admission for further evaluation of syncope which I believe is reasonable.  Plan: - Labs - EKG - Cardiac monitor - CT head, max face, C-spine, L spine - X-ray right hand - pain control - Anticipate admission  Patient's presentation is most consistent with acute presentation with potential threat to life or bodily function.  The patient is on the cardiac monitor to evaluate for evidence of arrhythmia and/or significant heart rate changes.  ED course below.  Workup unremarkable, urinalysis equivocal.  Discussed ongoing pain issues and possible syncopal sewed with hospitalist who agreed admission was appropriate.  Clinical Course as of 06/03/24 1534  Sun Jun 03, 2024  1303 CTH: IMPRESSION: 1. No acute intracranial abnormality related to the fall. 2. Moderate and confluent periventricular white matter hypoattenuation, more prominent anteriorly than posteriorly. This likely reflects the sequelae of chronic microvascular ischemia.   [MM]  1326 CBC unremarkable, hemoglobin within baseline range  CMP reviewed, unremarkable [MM]  1336 XR R hand: IMPRESSION: Degenerative changes.   [MM]  1410 CT Lspine: IMPRESSION: No acute fracture or traumatic malalignment of the lumbar spine.  Degenerative changes as above. Moderate spinal canal stenosis at L3-4 and L4-5. Foraminal stenosis greatest at L4-5.  Similar grade 1 anterolisthesis of L4 on L5.   [MM]  1410 Hospitalist consult order placed [MM]    Clinical Course User Index [MM] Clarine Ozell LABOR, MD     FINAL CLINICAL IMPRESSION(S) / ED DIAGNOSES   Final diagnoses:  Syncope, unspecified syncope type  Contusion of face, initial encounter  Chronic low back pain without sciatica, unspecified back pain laterality      Rx / DC Orders   ED Discharge Orders     None        Note:  This document was prepared using Dragon voice recognition software and may include unintentional dictation errors.   Clarine Ozell LABOR, MD 06/03/24 1534

## 2024-06-04 ENCOUNTER — Encounter: Payer: Self-pay | Admitting: Internal Medicine

## 2024-06-04 ENCOUNTER — Telehealth: Payer: Self-pay | Admitting: *Deleted

## 2024-06-04 DIAGNOSIS — S0083XA Contusion of other part of head, initial encounter: Secondary | ICD-10-CM | POA: Diagnosis present

## 2024-06-04 DIAGNOSIS — Z66 Do not resuscitate: Secondary | ICD-10-CM | POA: Diagnosis present

## 2024-06-04 DIAGNOSIS — W19XXXA Unspecified fall, initial encounter: Secondary | ICD-10-CM | POA: Diagnosis present

## 2024-06-04 DIAGNOSIS — M4316 Spondylolisthesis, lumbar region: Secondary | ICD-10-CM

## 2024-06-04 DIAGNOSIS — E1122 Type 2 diabetes mellitus with diabetic chronic kidney disease: Secondary | ICD-10-CM | POA: Diagnosis present

## 2024-06-04 DIAGNOSIS — I129 Hypertensive chronic kidney disease with stage 1 through stage 4 chronic kidney disease, or unspecified chronic kidney disease: Secondary | ICD-10-CM | POA: Diagnosis present

## 2024-06-04 DIAGNOSIS — E039 Hypothyroidism, unspecified: Secondary | ICD-10-CM | POA: Diagnosis present

## 2024-06-04 DIAGNOSIS — G8929 Other chronic pain: Secondary | ICD-10-CM | POA: Diagnosis present

## 2024-06-04 DIAGNOSIS — Z8572 Personal history of non-Hodgkin lymphomas: Secondary | ICD-10-CM | POA: Diagnosis not present

## 2024-06-04 DIAGNOSIS — Z8041 Family history of malignant neoplasm of ovary: Secondary | ICD-10-CM | POA: Diagnosis not present

## 2024-06-04 DIAGNOSIS — M48061 Spinal stenosis, lumbar region without neurogenic claudication: Secondary | ICD-10-CM | POA: Diagnosis present

## 2024-06-04 DIAGNOSIS — M5416 Radiculopathy, lumbar region: Secondary | ICD-10-CM | POA: Diagnosis present

## 2024-06-04 DIAGNOSIS — Z803 Family history of malignant neoplasm of breast: Secondary | ICD-10-CM | POA: Diagnosis not present

## 2024-06-04 DIAGNOSIS — N1831 Chronic kidney disease, stage 3a: Secondary | ICD-10-CM | POA: Diagnosis present

## 2024-06-04 DIAGNOSIS — Z86000 Personal history of in-situ neoplasm of breast: Secondary | ICD-10-CM | POA: Diagnosis not present

## 2024-06-04 DIAGNOSIS — M549 Dorsalgia, unspecified: Secondary | ICD-10-CM | POA: Diagnosis present

## 2024-06-04 DIAGNOSIS — K219 Gastro-esophageal reflux disease without esophagitis: Secondary | ICD-10-CM | POA: Diagnosis present

## 2024-06-04 DIAGNOSIS — E785 Hyperlipidemia, unspecified: Secondary | ICD-10-CM | POA: Diagnosis present

## 2024-06-04 DIAGNOSIS — Z923 Personal history of irradiation: Secondary | ICD-10-CM | POA: Diagnosis not present

## 2024-06-04 DIAGNOSIS — Z8249 Family history of ischemic heart disease and other diseases of the circulatory system: Secondary | ICD-10-CM | POA: Diagnosis not present

## 2024-06-04 DIAGNOSIS — Y929 Unspecified place or not applicable: Secondary | ICD-10-CM | POA: Diagnosis not present

## 2024-06-04 DIAGNOSIS — Z7901 Long term (current) use of anticoagulants: Secondary | ICD-10-CM | POA: Diagnosis not present

## 2024-06-04 DIAGNOSIS — Z8542 Personal history of malignant neoplasm of other parts of uterus: Secondary | ICD-10-CM | POA: Diagnosis not present

## 2024-06-04 DIAGNOSIS — Z7984 Long term (current) use of oral hypoglycemic drugs: Secondary | ICD-10-CM | POA: Diagnosis not present

## 2024-06-04 DIAGNOSIS — Z800 Family history of malignant neoplasm of digestive organs: Secondary | ICD-10-CM | POA: Diagnosis not present

## 2024-06-04 DIAGNOSIS — I4821 Permanent atrial fibrillation: Secondary | ICD-10-CM | POA: Diagnosis present

## 2024-06-04 DIAGNOSIS — Z823 Family history of stroke: Secondary | ICD-10-CM | POA: Diagnosis not present

## 2024-06-04 DIAGNOSIS — M810 Age-related osteoporosis without current pathological fracture: Secondary | ICD-10-CM | POA: Diagnosis present

## 2024-06-04 LAB — GLUCOSE, CAPILLARY
Glucose-Capillary: 119 mg/dL — ABNORMAL HIGH (ref 70–99)
Glucose-Capillary: 135 mg/dL — ABNORMAL HIGH (ref 70–99)
Glucose-Capillary: 146 mg/dL — ABNORMAL HIGH (ref 70–99)
Glucose-Capillary: 111 mg/dL — ABNORMAL HIGH (ref 70–99)

## 2024-06-04 LAB — BASIC METABOLIC PANEL WITH GFR
Anion gap: 8 (ref 5–15)
BUN: 15 mg/dL (ref 8–23)
CO2: 25 mmol/L (ref 22–32)
Calcium: 8.9 mg/dL (ref 8.9–10.3)
Chloride: 105 mmol/L (ref 98–111)
Creatinine, Ser: 0.73 mg/dL (ref 0.44–1.00)
GFR, Estimated: >60 mL/min (ref >60)
Glucose, Bld: 110 mg/dL — ABNORMAL HIGH (ref 70–99)
Potassium: 3.7 mmol/L (ref 3.5–5.1)
Sodium: 138 mmol/L (ref 135–145)

## 2024-06-04 LAB — CBC
HCT: 33.6 % — ABNORMAL LOW (ref 36.0–46.0)
Hemoglobin: 10.9 g/dL — ABNORMAL LOW (ref 12.0–15.0)
MCH: 30.2 pg (ref 26.0–34.0)
MCHC: 32.4 g/dL (ref 30.0–36.0)
MCV: 93.1 fL (ref 80.0–100.0)
Platelets: 257 10*3/uL (ref 150–400)
RBC: 3.61 MIL/uL — ABNORMAL LOW (ref 3.87–5.11)
RDW: 14.6 % (ref 11.5–15.5)
WBC: 6 10*3/uL (ref 4.0–10.5)
nRBC: 0 % (ref 0.0–0.2)

## 2024-06-04 MED ORDER — DIPHENHYDRAMINE HCL 25 MG PO CAPS
25.0000 mg | ORAL_CAPSULE | ORAL | Status: DC | PRN
  Administered 2024-06-04 – 2024-06-06 (×5): 25 mg via ORAL
  Filled 2024-06-04 (×5): qty 1

## 2024-06-04 NOTE — Evaluation (Signed)
 Occupational Therapy Evaluation Patient Details Name: Barbara Mooney MRN: [id number] DOB: 08-Sep-1934 Today's Date: 06/04/2024   History of Present Illness   Pt is an 89 y/o F admitted on 06/03/24 after presenting with c/o a fall. Pt is being treated for intractable back pain. Neurosurgery consulted, recommending facet injections to see if it will provide better pain control. PMH: DCIS, endometrial CA, CKD 3A, NHL, DM, HTN, HLD, hypothyroidism, a-fib on Eliquis , HOH, mixed incontinence     Clinical Impressions Patient presenting with decreased Ind in self care,balance, functional mobility/transfers, endurance, and safety awareness. Patient reports living at home alone at [facility] I level with use of RW for mobility. Pt's daughter, [name], lives 15 minutes away and assists her mother with IADLs if needed and plans to stay with her at discharge.  Patient currently functioning at min A overall with use of RW and cues for safety. Use of back precautions for comfort.Log roll for supine >sit and ambulation 10' into bathroom for toileting needs. PT arrives and pt transitions to PT evaluation without issue.  Patient will benefit from acute OT to increase overall independence in the areas of ADLs, functional mobility, and safety awareness in order to safely discharge.      If plan is discharge home, recommend the following:   A little help with walking and/or transfers;A little help with bathing/dressing/bathroom;Assistance with cooking/housework;Assist for transportation;Help with stairs or ramp for entrance     Functional Status Assessment   Patient has had a recent decline in their functional status and demonstrates the ability to make significant improvements in function in a reasonable and predictable amount of time.     Equipment Recommendations   None recommended by OT      Precautions/Restrictions   Precautions Precautions: Fall     Mobility Bed Mobility Overal bed mobility: Needs  Assistance Bed Mobility: Supine to Sit     Supine to sit: Min assist     General bed mobility comments: log roll for comfort    Transfers Overall transfer level: Needs assistance Equipment used: Rolling walker (2 wheels) Transfers: Sit to/from Stand Sit to Stand: Min assist                  Balance Overall balance assessment: Needs assistance, History of Falls Sitting-balance support: Feet supported Sitting balance-Leahy Scale: Good     Standing balance support: During functional activity, No upper extremity supported Standing balance-Leahy Scale: Fair                             ADL either performed or assessed with clinical judgement   ADL Overall ADL's : Needs assistance/impaired     Grooming: Wash/dry hands;Standing;Contact guard assist                   Toilet Transfer: Minimal assistance;Ambulation;Rolling walker (2 wheels);Regular Toilet           Functional mobility during ADLs: Contact guard assist;Rolling walker (2 wheels)       Vision Patient Visual Report: No change from baseline              Pertinent Vitals/Pain Pain Assessment Pain Assessment: Faces Faces Pain Scale: Hurts even more Pain Location: back Pain Descriptors / Indicators: Discomfort, Grimacing, Guarding Pain Intervention(s): Limited activity within patient's tolerance, Monitored during session, Repositioned, Patient requesting pain meds-RN notified     Extremity/Trunk Assessment Upper Extremity Assessment Upper Extremity Assessment: Generalized weakness   Lower  Extremity Assessment Lower Extremity Assessment: Generalized weakness       Communication Communication Communication: No apparent difficulties   Cognition Arousal: Alert Behavior During Therapy: WFL for tasks assessed/performed                                 Following commands: Intact       Cueing  General Comments   Cueing Techniques: Verbal cues               Home Living Family/patient expects to be discharged to:: Private residence Living Arrangements: Alone Available Help at Discharge: Family;Available 24 hours/day Type of Home: House Home Access: Stairs to enter [employer] of Steps: 5 Entrance Stairs-Rails: Right;Left;Can reach both Home Layout: One level               Home Equipment: Agricultural consultant (2 wheels);BSC/3in1;Shower seat          Prior Functioning/Environment Prior Level of Function : Independent/Modified Independent;Driving             Mobility Comments: ambulates with RW, driving      OT Problem List: Decreased strength;Impaired balance (sitting and/or standing);Pain;Decreased safety awareness;Decreased activity tolerance;Decreased knowledge of use of DME or AE;Impaired UE functional use   OT Treatment/Interventions: Self-care/ADL training;Therapeutic exercise;Patient/family education;Energy conservation;Therapeutic activities;DME and/or AE instruction      OT Goals(Current goals can be found in the care plan section)   Acute Rehab OT Goals Patient Stated Goal: to decrease pain and return home OT Goal Formulation: With patient/family Time For Goal Achievement: 06/18/24 Potential to Achieve Goals: Fair ADL Goals Pt Will Perform Grooming: with modified independence;standing Pt Will Perform Lower Body Dressing: with modified independence;sit to/from stand Pt Will Transfer to Toilet: with modified independence;ambulating Pt Will Perform Toileting - Clothing Manipulation and hygiene: with modified independence;sit to/from stand   OT Frequency:  Min 2X/week       AM-PAC OT 6 Clicks Daily Activity     Outcome Measure Help from another person eating meals?: None Help from another person taking care of personal grooming?: A Little Help from another person toileting, which includes using toliet, bedpan, or urinal?: A Little Help from another person bathing (including washing, rinsing,  drying)?: A Little Help from another person to put on and taking off regular upper body clothing?: A Little Help from another person to put on and taking off regular lower body clothing?: A Little 6 Click Score: 19   End of Session Equipment Utilized During Treatment: Rolling walker (2 wheels) Nurse Communication: Other (comment) (notified MD of family/pt request for R wrist brace)  Activity Tolerance: Patient tolerated treatment well;Patient limited by pain Patient left: Other (comment) (on commode with PT arriving)  OT Visit Diagnosis: Unsteadiness on feet (R26.81);Repeated falls (R29.6);Muscle weakness (generalized) (M62.81)                Time: 9044-8985 OT Time Calculation (min): 19 min Charges:  OT General Charges $OT Visit: 1 Visit OT Evaluation $OT Eval Low Complexity: 1 Low OT Treatments $Self Care/Home Management : 8-22 mins  Izetta Claude, MS, OTR/L , CBIS ascom [phone]  06/04/24, 12:18 PM

## 2024-06-04 NOTE — Telephone Encounter (Signed)
 Pt went to ED on 6/22, currently in the hospital.

## 2024-06-04 NOTE — Telephone Encounter (Signed)
 Pt currently admitted to hospital

## 2024-06-04 NOTE — Progress Notes (Signed)
 Progress Note   Patient: Barbara  B Mooney FMW:[id number] DOB: 23-Jul-1934 DOA: 06/03/2024     0 DOS: the patient was seen and examined on 06/04/2024   Brief hospital course: Barbara  B Mooney is a 89 y.o. female with medical history significant of DCIS, endometrial CA, stage 3a CKD, NHL, DM, HTN, HLD, hypothyroidism, and afib on Eliquis  who presented on 6/22 with a fall.   She had a fall last night around 1030 pm.  She may have fainted.  She is being treated by ortho with injections in her back.  She was changed from gabapentin to pregabalin Friday AM, had 4 doses, wasn't noticing side effects at bedtime.  She is having back and leg pain chronically.  She felt dizzy and next thing she knew she was lying on the flor.  She had used her potty chair, got up and felt dizzy.  She no longer feels dizzy.  Her back is so bad, feels like it has been.  She has recently been able to get OOB to chair with pain, today she could not do that.        ER Course:   ?syncope last night.  Known afib on Eliquis .  Difficulty getting around the house today, sees ortho and gets injections.  Passed out next to bed last night.  Imaging all negative.  Labs fine.    Assessment and Plan:    Intractable back pain Patient with severe chronic back pain Multimodal pain control with Robaxin + Tylenol  + Lidoderm  + Oxy + morphine  Continue PT OT Case discussed with IR who is planning to do injection in the L4-L5 region hopefully by tomorrow Case discussed with neurosurgeon patient is not a good surgical candidate Holding Eliquis  at this time N.p.o. after midnight   Stage 3a CKD Appears to be stable at this time Attempt to avoid nephrotoxic medications Recheck BMP in AM  Avoid NSAIDs if possible   DM Recent A1c 6.8, good control Hold Glucophage  Cover with moderate-scale SSI Carb modified diet    HTN Continue diltiazem , olmesartan  (irbesartan  per formulary) Hold Lasix    Afib Rate controlled with diltiazem  We will  hold Eliquis  given planned IR intervention   DNR I have discussed code status with the patient/family and they are in agreement that the patient would not desire resuscitation and would prefer to die a natural death should that situation arise. [facility] documents reviewed Patient will need a gold out of facility DNR form at the time of discharge      Advance Care Planning:   Code Status: Limited: Do not attempt resuscitation (DNR) -DNR-LIMITED -Do Not Intubate/DNI     Consults: Neurosurgery (telephone only); PT/OT; TOC team   DVT Prophylaxis: Eliquis  on hold  Subjective:  Seen and examined at bedside this morning still has some back pain radiating to bilateral legs Denies nausea vomiting abdominal pain chest pain cough  Physical Exam: Vitals:   06/04/24 0513 06/04/24 0800 06/04/24 0800 06/04/24 1625  BP: (!) 171/81 (!) 162/87 (!) 162/87 (!) 150/78  Pulse: 88 86 85 98  Resp:  17 17 17   Temp: 98.6 F (37 C) 98.7 F (37.1 C) 98.7 F (37.1 C) 99.1 F (37.3 C)  TempSrc:  Oral Oral   SpO2: 96% 95% 96% 96%  Weight:      Height:        Data Reviewed: CAT scan of the lumbar spine reviewed showing 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

## 2024-06-04 NOTE — Evaluation (Signed)
 Physical Therapy Evaluation Patient Details Name: Barbara Mooney MRN: [id number] DOB: 21-Mar-1934 Today's Date: 06/04/2024  History of Present Illness  Pt is an 89 y/o F admitted on 06/03/24 after presenting with c/o a fall. Pt is being treated for intractable back pain. Neurosurgery consulted, recommending facet injections to see if it will provide better pain control. PMH: DCIS, endometrial CA, CKD 3A, NHL, DM, HTN, HLD, hypothyroidism, a-fib on Eliquis , HOH, mixed incontinence  Clinical Impression  Pt seen for PT evaluation with pt agreeable, daughter Jacquelynne) present in room. Pt presents with back pain, requiring rest breaks throughout session. PT educated pt & daughter on building up seats to increase ease of sit<>stand transfers. Pt currently requires min assist for sit>stand from high surface, mod assist for low surface; pt with limited ability to push with BUE wrists. Pt ambulates short distance with RW & CGA, negotiates stairs with B rails & min assist with education re: compensatory pattern. Pt would benefit from ongoing PT services to maximize independence & reduce fall risk with mobility. Provided pt & daughter with gait belt, instructed them on use, & [name] participated in hands on training with transfers & stair negotiation.        If plan is discharge home, recommend the following: A little help with walking and/or transfers;A little help with bathing/dressing/bathroom;Assistance with cooking/housework;Assist for transportation;Help with stairs or ramp for entrance   Can travel by private vehicle        Equipment Recommendations Rolling walker (2 wheels)  Recommendations for Other Services       Functional Status Assessment Patient has had a recent decline in their functional status and demonstrates the ability to make significant improvements in function in a reasonable and predictable amount of time.     Precautions / Restrictions Precautions Precautions: Fall (back for  comfort) Restrictions Weight Bearing Restrictions Per Provider Order: No      Mobility  Bed Mobility               General bed mobility comments: not tested, pt received on toilet, left in recliner    Transfers Overall transfer level: Needs assistance Equipment used: Rolling walker (2 wheels) Transfers: Sit to/from Stand Sit to Stand: Mod assist, Min assist           General transfer comment: sit>stand from low toilet with grab bar, sit>stand from recliner with min assist; cuing re: hand placement, assistance to push to standing    Ambulation/Gait Ambulation/Gait assistance: Contact guard assist Gait Distance (Feet): 18 Feet Assistive device: Rolling walker (2 wheels) Gait Pattern/deviations: Decreased step length - right, Decreased step length - left, Decreased stride length Gait velocity: decreased     General Gait Details: cuing to square up to sink for hand hygiene vs pushing RW off to side  Stairs Stairs: Yes Stairs assistance: Min assist Stair Management: Two rails, Step to pattern Number of Stairs: 4 (6) General stair comments: education re: compensatory pattern  Wheelchair Mobility     Tilt Bed    Modified Rankin (Stroke Patients Only)       Balance Overall balance assessment: Needs assistance, History of Falls Sitting-balance support: Feet supported Sitting balance-Leahy Scale: Good     Standing balance support: During functional activity, No upper extremity supported Standing balance-Leahy Scale: Fair Standing balance comment: hand hygiene standing at sink  Pertinent Vitals/Pain Pain Assessment Pain Assessment: 0-10 Pain Score: 4  Pain Location: back Pain Descriptors / Indicators: Discomfort, Grimacing, Guarding Pain Intervention(s): Monitored during session, Limited activity within patient's tolerance, Repositioned    Home Living Family/patient expects to be discharged to:: Private  residence Living Arrangements: Alone Available Help at Discharge: Family;Available 24 hours/day Type of Home: House Home Access: Stairs to enter Entrance Stairs-Rails: Right;Left;Can reach both Entrance Stairs-Number of Steps: 5   Home Layout: One level Home Equipment: Agricultural consultant (2 wheels)      Prior Function               Mobility Comments: ambulates with RW, driving       Extremity/Trunk Assessment   Upper Extremity Assessment Upper Extremity Assessment: Generalized weakness (reports hx of breaking L wrist, reports sprained R wrist during fall that led to this admission)    Lower Extremity Assessment Lower Extremity Assessment: Generalized weakness       Communication   Communication Communication: No apparent difficulties    Cognition Arousal: Alert Behavior During Therapy: WFL for tasks assessed/performed   PT - Cognitive impairments: No apparent impairments                         Following commands: Intact       Cueing Cueing Techniques: Verbal cues     General Comments      Exercises     Assessment/Plan    PT Assessment Patient needs continued PT services  PT Problem List Decreased strength;Pain;Cardiopulmonary status limiting activity;Decreased range of motion;Decreased activity tolerance;Decreased knowledge of use of DME;Decreased balance;Decreased mobility;Decreased knowledge of precautions       PT Treatment Interventions DME instruction;Balance training;Neuromuscular re-education;Gait training;Functional mobility training;Stair training;Therapeutic activities;Therapeutic exercise;Patient/family education    PT Goals (Current goals can be found in the Care Plan section)  Acute Rehab PT Goals Patient Stated Goal: go home, decreased pain PT Goal Formulation: With patient/family Time For Goal Achievement: 06/18/24 Potential to Achieve Goals: Good    Frequency Min 3X/week     Co-evaluation               AM-PAC  PT 6 Clicks Mobility  Outcome Measure Help needed turning from your back to your side while in a flat bed without using bedrails?: A Little Help needed moving from lying on your back to sitting on the side of a flat bed without using bedrails?: A Lot Help needed moving to and from a bed to a chair (including a wheelchair)?: A Little Help needed standing up from a chair using your arms (e.g., wheelchair or bedside chair)?: A Lot Help needed to walk in hospital room?: A Little Help needed climbing 3-5 steps with a railing? : A Little 6 Click Score: 16    End of Session Equipment Utilized During Treatment: Gait belt Activity Tolerance: Patient tolerated treatment well;Patient limited by pain Patient left: in chair;with family/visitor present Nurse Communication: Mobility status;Patient requests pain meds PT Visit Diagnosis: History of falling (Z91.81);Muscle weakness (generalized) (M62.81);Pain;Other abnormalities of gait and mobility (R26.89);Unsteadiness on feet (R26.81);Difficulty in walking, not elsewhere classified (R26.2) Pain - part of body:  (back)    Time: 8988-8954 PT Time Calculation (min) (ACUTE ONLY): 34 min   Charges:   PT Evaluation $PT Eval Moderate Complexity: 1 Mod PT Treatments $Gait Training: 8-22 mins PT General Charges $$ ACUTE PT VISIT: 1 Visit         Richerd Pinal, PT, DPT 06/04/24, 10:55  AM   Richerd CHRISTELLA Pinal 06/04/2024, 10:53 AM

## 2024-06-04 NOTE — Consult Note (Signed)
 Consulting Department:  Inpatient medicine  Primary Physician:  Glendia Shad, MD  Chief Complaint: Debilitating back pain  History of Present Illness: 06/04/2024 Barbara Mooney is a 89 y.o. female who presents with the chief complaint of debilitating back pain.  Debilitating radiculopathy.  She has been previously managed by [facility].  She previously saw a spine surgeon who discussed possible L4-5 interbody fusion due to her crippling back pain and lower extremity radiculopathy.  At that time however it was made as a decision not to go forward with surgery as she was so high risk given her age and medical comorbidities.  They have had some transforaminal epidural spinal injections which only gave her limited relief.  She is also recently been switched from gabapentin to Lyrica and feels like she is having more side effects from the Lyrica.  She feels like this may have been why she fell recently.  During this fall she has sprained her right wrist.  Surgery was consulted for evaluation for intractable back pain.  The symptoms are causing a significant impact on the patient's life.   Review of Systems:  A 10 point review of systems is negative, except for the pertinent positives and negatives detailed in the HPI.  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   a.) stage 0 (high grade cTis (DCIS), cN0, cM0) with comedonecrosis -- > s/p lumpectomy + XRT   Endometrial cancer (HCC) 05/25/2023   a.) pathology (+) for well differentiated endometroid carcinoma (FIGO 1)   Follicular non-Hodgkin's lymphoma (HCC) 2018   a.) 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 of the RIGHT proximal tibia s/p XRT   GERD  (gastroesophageal reflux disease)    History of hiatal hernia    HLD (hyperlipidemia)    HOH (hard of hearing)    Hypertension    Hypothyroidism    Mixed incontinence    MRSA infection    a.) remote history; posterior torso   Multiple thyroid  nodules    Peripheral edema    Permanent atrial fibrillation (HCC) 10/2016   a.) CHA2DS2VASc = 6 (age x2, sex, HTN, vascular disease history, T2DM);  b.) s/p DCCV 01/20/2017 (150 J x 1) --> failed; c.) rate/rhythm maintained on oral diltiazem ; chronically anticoagulated with apixaban    RBBB (right bundle branch block)    Recurrent UTI (urinary tract infection)    Unsteady gait    Varicose veins of both lower extremities    Vitamin B12 deficiency     Past Surgical History: Past Surgical History:  Procedure Laterality Date   BONE BIOPSY Right 2018   tibia   BREAST BIOPSY Right 02/26/2021   ffirm bx-X clip-HIGH-GRADE DUCTAL CARCINOMA IN SITU   BREAST LUMPECTOMY Right 03/09/2021   Procedure: BREAST LUMPECTOMY;  Surgeon: Dessa Reyes ORN, MD;  Location: [facility];  Service: General;  Laterality: Right;   CARDIOVERSION N/A 01/20/2017   Procedure: CARDIOVERSION;  Surgeon: Evalene JINNY Lunger, MD;  Location: [facility];  Service: Cardiovascular;  Laterality: N/A;   CATARACT EXTRACTION W/PHACO Right 02/15/2018   Procedure: CATARACT EXTRACTION PHACO AND INTRAOCULAR LENS PLACEMENT (IOC);  Surgeon: Jaye Fallow, MD;  Location: [facility];  Service: Ophthalmology;  Laterality: Right;  US  01:22.8 AP% 18.9 CDE 15.62 Fluid Pack Lot # [id number]   CATARACT EXTRACTION W/PHACO Left 03/08/2018   Procedure: CATARACT EXTRACTION PHACO AND INTRAOCULAR LENS PLACEMENT (IOC);  Surgeon: Jaye Fallow, MD;  Location: [facility];  Service: Ophthalmology;  Laterality: Left;  US  01:05 AP% 15.8 CDE 10.28 Fluid pak lot # [id number]   COLONOSCOPY     TOTAL LAPAROSCOPIC HYSTERECTOMY WITH BILATERAL SALPINGO OOPHORECTOMY N/A 07/27/2023   Procedure: TOTAL LAPAROSCOPIC  HYSTERECTOMY WITH BILATERAL SALPINGO OOPHORECTOMY, SENTINEL LYMPH NODE INJECTION AND MAPPING, NODE DISSECTION;  Surgeon: Mancil Barter, MD;  Location: [facility];  Service: Gynecology;  Laterality: N/A;    Allergies: Allergies as of 06/03/2024 - Review Complete 06/03/2024  Allergen Reaction Noted   Nitrofurantoin  Diarrhea, Nausea Only, and Other (See Comments) 02/16/2019   Sulfa antibiotics Nausea Only and Other (See Comments) 04/07/2021    Medications:  Current Facility-Administered Medications:    acetaminophen  (TYLENOL ) tablet 500 mg, 500 mg, Oral, TID, Barbarann Nest, MD, 500 mg at 06/04/24 9175   apixaban  (ELIQUIS ) tablet 5 mg, 5 mg, Oral, BID, Barbarann Nest, MD, 5 mg at 06/04/24 9175   bisacodyl (DULCOLAX) EC tablet 5 mg, 5 mg, Oral, Daily PRN, Barbarann Nest, MD   diltiazem  (CARDIZEM  CD) 24 hr capsule 180 mg, 180 mg, Oral, BID, Barbarann Nest, MD, 180 mg at 06/04/24 9175   diphenhydrAMINE  (BENADRYL ) capsule 25 mg, 25 mg, Oral, Q4H PRN, Cleatus Delayne GAILS, MD, 25 mg at 06/04/24 9387   docusate sodium  (COLACE) capsule 100 mg, 100 mg, Oral, BID, Barbarann Nest, MD, 100 mg at 06/04/24 9175   hydrALAZINE (APRESOLINE) injection 5 mg, 5 mg, Intravenous, Q4H PRN, Barbarann Nest, MD   insulin aspart (novoLOG) injection 0-15 Units, 0-15 Units, Subcutaneous, TID WC, Barbarann Nest, MD   irbesartan  (AVAPRO ) tablet 300 mg, 300 mg, Oral, Daily, Barbarann Nest, MD, 300 mg at 06/04/24 9175   lidocaine  (LIDODERM ) 5 % 1-3 patch, 1-3 patch, Transdermal, Q24H, Barbarann Nest, MD, 2 patch at 06/03/24 1658   methocarbamol (ROBAXIN) injection 500 mg, 500 mg, Intravenous, Q8H, Barbarann Nest, MD, 500 mg at 06/04/24 9387   morphine  (PF) 2 MG/ML injection 2 mg, 2 mg, Intravenous, Q2H PRN, Barbarann Nest, MD, 2 mg at 06/04/24 9175   ondansetron  (ZOFRAN ) tablet 4 mg, 4 mg, Oral, Q6H PRN **OR** ondansetron  (ZOFRAN ) injection 4 mg, 4 mg, Intravenous, Q6H PRN, Barbarann Nest, MD   oxyCODONE  (Oxy  IR/ROXICODONE ) immediate release tablet 5 mg, 5 mg, Oral, Q4H PRN, Barbarann Nest, MD, 5 mg at 06/04/24 1100   pantoprazole  (PROTONIX ) EC tablet 40 mg, 40 mg, Oral, Daily, Barbarann Nest, MD, 40 mg at 06/04/24 9175   polyethylene glycol (MIRALAX / GLYCOLAX) packet 17 g, 17 g, Oral, Daily PRN, Barbarann Nest, MD   Social History: Social History   Tobacco Use   Smoking status: Never   Smokeless tobacco: Never  Vaping Use   Vaping status: Never Used  Substance Use Topics   Alcohol use: No   Drug use: No    Family Medical History: Family History  Problem Relation Age of Onset   Breast cancer Sister 17   Stroke Sister    Atrial fibrillation Sister    Heart disease Brother    Ovarian cancer Maternal Aunt    Colon cancer Maternal Aunt    Kidney cancer Neg Hx    Bladder Cancer Neg Hx     Physical Examination: Vitals:   06/04/24 0800 06/04/24 0800  BP: (!) 162/87 (!) 162/87  Pulse: 86 85  Resp: 17 17  Temp: 98.7 F (37.1 C) 98.7 F (37.1 C)  SpO2: 95% 96%     General: Patient is well developed, well nourished, calm, collected, and in no apparent distress.  NEUROLOGICAL:  General: Sitting in a chair, no acute distress  Strength: She has good strength in her distal lower extremities bilaterally.  She does have some pain with activation proximally.  She states that most of her pain is in her buttocks and posterior legs.  Imaging: CT Lumbar Spine Wo Contrast Result Date: 06/03/2024 CLINICAL DATA:  Trauma, fall, acute on chronic lower back pain. EXAM: CT LUMBAR SPINE WITHOUT CONTRAST TECHNIQUE: Multidetector CT imaging of the lumbar spine was performed without intravenous contrast administration. Multiplanar CT image reconstructions were also generated. RADIATION DOSE REDUCTION: This exam was performed according to the departmental dose-optimization program which includes automated exposure control, adjustment of the mA and/or kV according to patient size and/or use of  iterative reconstruction technique. COMPARISON:  MRI lumbar spine 05/09/2010. FINDINGS: Segmentation: 5 lumbar type vertebrae. Alignment: Lumbar lordosis is maintained. Similar grade 1 anterolisthesis of L4 on L5. Vertebrae: No compression fracture or displaced fracture in the lumbar spine. No suspicious lytic or blastic osseous lesion. Degenerative endplate changes and endplate osteophytes most pronounced at L1-2 and L4-5. Paraspinal and other soft tissues: The paraspinal soft tissues are unremarkable. Extensive atherosclerosis of the abdominal aorta and branch vessels. Disc levels: Mild intervertebral disc space narrowing at L1-2. Severe disc space narrowing at L4-5 with associated endplate sclerosis and irregularity. Small disc bulges at multiple levels. Disc bulge, facet arthrosis, and thickening of the ligamentum flavum at L3-4 resulting in moderate spinal canal stenosis. Additional disc bulge at L4-5 along with moderate facet arthrosis and thickening of the ligamentum flavum resulting in at least moderate spinal canal stenosis likely with lateral recess narrowing. There is moderate bilateral foraminal stenosis at L4-5. IMPRESSION: No acute fracture or traumatic malalignment of the lumbar spine. Degenerative changes as above. Moderate spinal canal stenosis at L3-4 and L4-5. Foraminal stenosis greatest at L4-5. Similar grade 1 anterolisthesis of L4 on L5. Electronically Signed   By: Donnice Mania M.D.   On: 06/03/2024 13:55   DG Hand Complete Right Result Date: 06/03/2024 CLINICAL DATA:  Pain. EXAM: RIGHT HAND - COMPLETE 3 VIEW COMPARISON:  None Available. FINDINGS: Distal interphalangeal degenerative changes with joint space narrowing and osteophytes. No acute fracture, dislocation or subluxation. No osteolytic or osteoblastic lesions. IMPRESSION: Degenerative changes. Electronically Signed   By: Fonda Field M.D.   On: 06/03/2024 13:24   CT Cervical Spine Wo Contrast Result Date: 06/03/2024 EXAM: CT  CERVICAL SPINE WITHOUT CONTRAST 06/03/2024 12:50:40 PM TECHNIQUE: CT of the cervical was performed without the administration of intravenous contrast. Multiplanar reformatted images are provided for review. Automated exposure control, iterative reconstruction, and/or weight based adjustment of the mA/kV was utilized to reduce the radiation dose to as low as reasonably achievable. COMPARISON: None available. CLINICAL HISTORY: Fall. Pt states she thinks she passed out. Pt has some back issues and had shots for that. Pt has been taking meds for leg weakness and back pain. Pt did hit her head and is on thinners. FINDINGS: CERVICAL SPINE: BONES AND ALIGNMENT: Anterior osteophytes are fused C3-6. Large overhanging anterior osteophytes are present at C6-7. Straightening of the normal cervical lordosis is present. Ossification of the posterior spinal ligament is noted, likely related to remote trauma. DEGENERATIVE CHANGES: Degenerative changes as described above. SOFT TISSUES: No prevertebral soft tissue swelling. VASCULATURE: Atherosclerotic calcifications  are present in the proximal great vessels. No aneurysm or stenosis is evident. LUNGS: Patchy ground-glass attenuation is present in both lungs. IMPRESSION: 1. No acute abnormality of the cervical spine related to the fall. 2. Straightening of the normal cervical lordosis. 3. Ossification of the posterior spinal ligament, likely related to remote trauma. Electronically signed by: Lonni Necessary MD 06/03/2024 01:06 PM EDT RP Workstation: [id number]   CT Maxillofacial Wo Contrast Result Date: 06/03/2024 EXAM: CT OF THE FACE WITHOUT CONTRAST 06/03/2024 12:50:40 PM TECHNIQUE: CT of the face was performed without the administration of intravenous contrast. Multiplanar reformatted images are provided for review. Automated exposure control, iterative reconstruction, and/or weight based adjustment of the mA/kV was utilized to reduce the radiation dose to as low as  reasonably achievable. COMPARISON: None available. CLINICAL HISTORY: Fall. Pt states she thinks she passed out. Pt has some back issues and had shots for that. Pt has been taking meds for leg weakness and back pain. Pt did hit her head and is on thinners. FINDINGS: FACIAL BONES: No acute facial fracture. No mandibular dislocation. No suspicious bone lesion. ORBITS: Bilateral lens replacements are noted. The globes and orbits are otherwise within normal limits. SINUSES AND MASTOIDS: A polyp or mucous retention cyst is present in the inferior left maxillary sinus and in a posterior right ethmoid air cell. SOFT TISSUES: No acute abnormality. IMPRESSION: 1. No acute facial fracture or significant soft tissue injury. Electronically signed by: Lonni Necessary MD 06/03/2024 01:03 PM EDT RP Workstation: [id number]   CT Head Wo Contrast Result Date: 06/03/2024 EXAM: CT HEAD WITHOUT CONTRAST 06/03/2024 12:50:40 PM TECHNIQUE: CT of the head was performed without the administration of intravenous contrast. Automated exposure control, iterative reconstruction, and/or weight based adjustment of the mA/kV was utilized to reduce the radiation dose to as low as reasonably achievable. COMPARISON: None available. CLINICAL HISTORY: Fall. Pt hit her head and is on blood thinners. FINDINGS: BRAIN AND VENTRICLES: No acute hemorrhage. Gray-white differentiation is preserved. No hydrocephalus. No extra-axial collection. No mass effect or midline shift. Moderate and confluent periventricular white matter hypoattenuation is more prominent anteriorly than posteriorly. ORBITS: No acute abnormality. Inserted lens replacement. SINUSES: A polyp or mixed retention cyst is present in a posterior right ethmoid air cell. The paranasal sinuses are otherwise clear. SOFT TISSUES AND SKULL: No acute soft tissue abnormality. No skull fracture. Atherosclerotic calcifications are present in the cavernous carotid arteries bilaterally and at the dural  margin of both vertebral arteries. No hyperdense vessel is present. The mastoid air cells are clear. IMPRESSION: 1. No acute intracranial abnormality related to the fall. 2. Moderate and confluent periventricular white matter hypoattenuation, more prominent anteriorly than posteriorly. This likely reflects the sequelae of chronic microvascular ischemia. Electronically signed by: Lonni Necessary MD 06/03/2024 01:01 PM EDT RP Workstation: [id number]     I have personally reviewed the images and agree with the above interpretation  I did review her lumbar CT scan myself, the CT scan does show significant degenerative changes with at least a grade 1 spondylolisthesis.  There does not appear to be any pars fracture, looks degenerative in origin.  It does cause severe stenosis at the L4-5 foramina bilaterally.  Labs:    Latest Ref Rng & Units 06/04/2024    1:19 AM 06/03/2024   12:24 PM 04/19/2024    8:48 AM  CBC  WBC 4.0 - 10.5 K/uL 6.0  5.7  5.9   Hemoglobin 12.0 - 15.0 g/dL 89.0  88.2  87.2   Hematocrit  36.0 - 46.0 % 33.6  35.3  38.6   Platelets 150 - 400 K/uL 257  276  254.0       Latest Ref Rng & Units 06/04/2024    1:19 AM 06/03/2024   12:24 PM 04/19/2024    8:48 AM  BMP  Glucose 70 - 99 mg/dL 889  839  869   BUN 8 - 23 mg/dL 15  15  23    Creatinine 0.44 - 1.00 mg/dL 9.26  9.17  8.96   Sodium 135 - 145 mmol/L 138  137  139   Potassium 3.5 - 5.1 mmol/L 3.7  3.7  4.2   Chloride 98 - 111 mmol/L 105  103  101   CO2 22 - 32 mmol/L 25  25  30    Calcium 8.9 - 10.3 mg/dL 8.9  9.3  9.4         Assessment and Plan: Ms. Flippin is a pleasant 89 y.o. female with history of chronic intractable back pain.  She had a recent fall after being switched from gabapentin to Lyrica.  She sprained her wrist and felt like her back was exacerbated.  Neurosurgery was consulted for any surgical options or multimodal pain applications.  On her examination and history she does not have any major deficits, she  does have what appears to be a likely active bilateral radiculopathy in the lower lumbar distribution, it is exacerbated with leg strengthening.  Her CT scan demonstrates a severe disc collapse with spondylolisthesis of approximately grade 1 to grade 2.  This causes severe bilateral neuroforaminal stenosis.  This is likely the cause of her neuropathic pain that goes down her legs.  It is also likely the cause of her back pain.  I reviewed her DEXA scan from 2 years ago which showed osteoporosis with a T-score of -2.4.  She has had a previous spinal surgical evaluation at [facility] where it was deemed to risk to undergo a interbody fusion.  I discussed this with the patient, let her know that her likelihood for failure with the interbody fusion which would help with her foraminal stenosis would be very high given her comorbidities as well as her low bone quality.  They stated that they would not want to go forward with any surgical interventions.  They are more interested in possible nonsurgical options.  I did offer them a referral to the pain clinic here at [facility]/[place].  They would like to go forward with that.  They asked if I would reach out to see if there was any inpatient options for injections.  I will reach out to interventional radiology to see, however I let them know that this was very low likelihood especially that she is on blood thinners currently.  Penne MICAEL Sharps, MD/MSCR Dept. of Neurosurgery

## 2024-06-04 NOTE — Plan of Care (Signed)

## 2024-06-05 DIAGNOSIS — M549 Dorsalgia, unspecified: Secondary | ICD-10-CM | POA: Diagnosis not present

## 2024-06-05 LAB — CBC WITH DIFFERENTIAL/PLATELET
Abs Immature Granulocytes: 0.02 10*3/uL (ref 0.00–0.07)
Basophils Absolute: 0 10*3/uL (ref 0.0–0.1)
Basophils Relative: 0 %
Eosinophils Absolute: 0.2 10*3/uL (ref 0.0–0.5)
Eosinophils Relative: 3 %
HCT: 38.1 % (ref 36.0–46.0)
Hemoglobin: 12.3 g/dL (ref 12.0–15.0)
Immature Granulocytes: 0 %
Lymphocytes Relative: 6 %
Lymphs Abs: 0.3 10*3/uL — ABNORMAL LOW (ref 0.7–4.0)
MCH: 30.1 pg (ref 26.0–34.0)
MCHC: 32.3 g/dL (ref 30.0–36.0)
MCV: 93.2 fL (ref 80.0–100.0)
Monocytes Absolute: 0.4 10*3/uL (ref 0.1–1.0)
Monocytes Relative: 7 %
Neutro Abs: 4.5 10*3/uL (ref 1.7–7.7)
Neutrophils Relative %: 84 %
Platelets: 272 10*3/uL (ref 150–400)
RBC: 4.09 MIL/uL (ref 3.87–5.11)
RDW: 14.2 % (ref 11.5–15.5)
WBC: 5.4 10*3/uL (ref 4.0–10.5)
nRBC: 0 % (ref 0.0–0.2)

## 2024-06-05 LAB — BASIC METABOLIC PANEL WITH GFR
Anion gap: 10 (ref 5–15)
BUN: 18 mg/dL (ref 8–23)
CO2: 26 mmol/L (ref 22–32)
Calcium: 9.2 mg/dL (ref 8.9–10.3)
Chloride: 103 mmol/L (ref 98–111)
Creatinine, Ser: 0.82 mg/dL (ref 0.44–1.00)
GFR, Estimated: >60 mL/min (ref >60)
Glucose, Bld: 128 mg/dL — ABNORMAL HIGH (ref 70–99)
Potassium: 3.7 mmol/L (ref 3.5–5.1)
Sodium: 139 mmol/L (ref 135–145)

## 2024-06-05 LAB — GLUCOSE, CAPILLARY
Glucose-Capillary: 115 mg/dL — ABNORMAL HIGH (ref 70–99)
Glucose-Capillary: 106 mg/dL — ABNORMAL HIGH (ref 70–99)
Glucose-Capillary: 185 mg/dL — ABNORMAL HIGH (ref 70–99)

## 2024-06-05 NOTE — Progress Notes (Signed)
 Physical Therapy Treatment Patient Details Name: Barbara Mooney MRN: [id number] DOB: 01-22-1934 Today's Date: 06/05/2024   History of Present Illness Pt is an 89 y/o F admitted on 06/03/24 after presenting with c/o a fall. Pt is being treated for intractable back pain. Neurosurgery consulted, recommending facet injections to see if it will provide better pain control. PMH: DCIS, endometrial CA, CKD 3A, NHL, DM, HTN, HLD, hypothyroidism, a-fib on Eliquis , HOH, mixed incontinence    PT Comments  Patient is agreeable to PT session despite ongoing back pain. She required intermittent assistance with bed mobility. Two standing bouts performed with CGA. Short distance ambulation with rolling walker with activity tolerance limited by pain. Supportive family member present and reports she will provide care at discharge. Recommend to continue PT to maximize independence and facilitate return to prior level of function.    If plan is discharge home, recommend the following: A little help with walking and/or transfers;A little help with bathing/dressing/bathroom;Assistance with cooking/housework;Assist for transportation;Help with stairs or ramp for entrance   Can travel by private vehicle        Equipment Recommendations  Rolling walker (2 wheels)    Recommendations for Other Services       Precautions / Restrictions Precautions Precautions: Fall Restrictions Weight Bearing Restrictions Per Provider Order: No     Mobility  Bed Mobility Overal bed mobility: Needs Assistance Bed Mobility: Supine to Sit, Sit to Supine, Rolling Rolling: Min assist   Supine to sit: Min assist Sit to supine: Min assist   General bed mobility comments: cues for logroll technique for comfort.    Transfers Overall transfer level: Needs assistance Equipment used: Rolling walker (2 wheels) Transfers: Sit to/from Stand Sit to Stand: Contact guard assist           General transfer comment: CGA for safety.  patient able to stand from bed at lowest height and from bed side commode    Ambulation/Gait Ambulation/Gait assistance: Contact guard assist Gait Distance (Feet):  (20ft, 32ft) Assistive device: Rolling walker (2 wheels) Gait Pattern/deviations: Step-through pattern, Decreased stride length Gait velocity: decreased     General Gait Details: reinforcement to continue using rolling walker for safety with ambulation. activity tolerance self limited secondary to pain   Stairs             Wheelchair Mobility     Tilt Bed    Modified Rankin (Stroke Patients Only)       Balance Overall balance assessment: Needs assistance, History of Falls Sitting-balance support: Feet supported Sitting balance-Leahy Scale: Good     Standing balance support: During functional activity, No upper extremity supported, Reliant on assistive device for balance Standing balance-Leahy Scale: Fair                              Hotel manager: No apparent difficulties  Cognition Arousal: Alert Behavior During Therapy: WFL for tasks assessed/performed   PT - Cognitive impairments: No apparent impairments                         Following commands: Intact      Cueing Cueing Techniques: Verbal cues  Exercises      General Comments        Pertinent Vitals/Pain Pain Assessment Pain Assessment: Faces Faces Pain Scale: Hurts whole lot Pain Location: back Pain Descriptors / Indicators: Discomfort, Grimacing, Guarding Pain Intervention(s): Limited activity within patient's tolerance,  Monitored during session, Repositioned    Home Living                          Prior Function            PT Goals (current goals can now be found in the care plan section) Acute Rehab PT Goals Patient Stated Goal: pain control, home PT Goal Formulation: With patient/family Time For Goal Achievement: 06/18/24 Potential to Achieve Goals:  Good Progress towards PT goals: Progressing toward goals    Frequency    Min 3X/week      PT Plan      Co-evaluation              AM-PAC PT 6 Clicks Mobility   Outcome Measure  Help needed turning from your back to your side while in a flat bed without using bedrails?: A Little Help needed moving from lying on your back to sitting on the side of a flat bed without using bedrails?: A Lot Help needed moving to and from a bed to a chair (including a wheelchair)?: A Little Help needed standing up from a chair using your arms (e.g., wheelchair or bedside chair)?: A Lot Help needed to walk in hospital room?: A Little Help needed climbing 3-5 steps with a railing? : A Little 6 Click Score: 16    End of Session   Activity Tolerance: Patient limited by pain Patient left: in bed;with call bell/phone within reach;with family/visitor present   PT Visit Diagnosis: History of falling (Z91.81);Muscle weakness (generalized) (M62.81);Pain;Other abnormalities of gait and mobility (R26.89);Unsteadiness on feet (R26.81);Difficulty in walking, not elsewhere classified (R26.2)     Time: 8668-8654 PT Time Calculation (min) (ACUTE ONLY): 14 min  Charges:    $Therapeutic Activity: 8-22 mins PT General Charges $$ ACUTE PT VISIT: 1 Visit                     Barbara Mooney, PT, MPT   Barbara Mooney 06/05/2024, 2:13 PM

## 2024-06-05 NOTE — Progress Notes (Signed)
 Brief Interventional Radiology Note:   89 year old female with a history of chronic back pain and recent fall leading to worsening back pain and radiculopathy. Patient has been previously managed with injections by [facility]. She has been evaluated by Neurosurgery who does not feel she is a surgical candidate at this time. IR subsequently consulted for possible facet injection which was approved by Dr. G Babcock. Unfortunately, this was not able to be completed while she was inpatient. Daughter understandably expressed frustration regarding delay in her mother's care and multiple miscommunications. I had a long discussion with the patient and her daughter at the bedside regarding the plan moving forward. They are agreeable to pursuing outpatient appointments for an L4-5 facet injection. Outpatient order placed and will follow up with team and daughter with additional information.   Please feel free to reach out to IR for any additional questions or concerns.  Electronically Signed: Neisha Hinger M Enes Wegener, PA-C 06/05/2024, 5:43 PM

## 2024-06-05 NOTE — Progress Notes (Signed)
 Progress Note   Patient: Barbara  B Mooney FMW:[id number] DOB: [birth year] DOA: 06/03/2024     1 DOS: the patient was seen and examined on 06/05/2024     Brief hospital course: Barbara  B Mooney is a 89 y.o. female with medical history significant of DCIS, endometrial CA, stage 3a CKD, NHL, DM, HTN, HLD, hypothyroidism, and afib on Eliquis  who presented on 6/22 with a fall.   Patient currently being managed for intractable back pain with spinal stenosis most severe at L4-L5.  IR initially planned facets injection inpatient, but this is now being planned as an outpatient.      Assessment and Plan:   Intractable back pain Patient with severe chronic back pain Multimodal pain control with Robaxin + Tylenol  + Lidoderm  + Oxy + morphine  Continue PT OT IR planned to do facets injection today however due to busy schedule they were unable to reach patient According to IR it appears there is no other provider who is able to do this for this week and so they are considering outpatient option. Case discussed with neurosurgeon and patient is not a good surgical candidate Holding Eliquis  at this time Diet resumed   Stage 3a CKD Appears to be stable at this time Avoid nephrotoxic agent Monitor renal function   DM Recent A1c 6.8, good control Hold Glucophage  Cover with moderate-scale SSI Carb modified diet    HTN Continue diltiazem , olmesartan  (irbesartan  per formulary) Hold Lasix    Afib Rate controlled with diltiazem  We will hold Eliquis  given planned IR intervention   CODE STATUS: DNR      Advance Care Planning:   Code Status: Limited: Do not attempt resuscitation (DNR) -DNR-LIMITED -Do Not Intubate/DNI     Consults: Neurosurgery (telephone only); PT/OT; TOC team   DVT Prophylaxis: Eliquis  on hold   Subjective:  Patient seen and examined at bedside in the presence of the daughter She admits to improvement with her back pain with medications IR planned to do facets injection  today however due to busy schedule they were unable to reach patient According to IR it appears there is no other provider who is able to do this for this week and so they are considering outpatient option. I have discussed with patient's daughter who is hoping for discharge home tomorrow or Thursday depending on how patient is feeling.   Physical Exam: General:  Appears calm but very uncomfortable, significant back pain even while at rest Eyes:  PERRL, EOMI, normal lids, iris ENT:  grossly normal hearing, lips & tongue, mmm Cardiovascular:  Irregularly irregular, rate controlled. No LE edema.  Respiratory:   CTA bilaterally with no wheezes/rales/rhonchi.  Normal respiratory effort. Abdomen:  soft, NT, ND Skin:  no rash or induration seen on limited exam Musculoskeletal:  L > RLE , no bony abnormality Psychiatric: blunted mood and affect, speech fluent and appropriate, AOx3 Neurologic:  CN 2-12 grossly intact, moves all extremities in coordinated fashion     Data Reviewed: CT scan of the lumbar spine reviewed showing stenosis severe in the area of L4-L5   Vitals:   06/05/24 0430 06/05/24 0711 06/05/24 0733 06/05/24 1526  BP: (!) 150/92 (!) 158/68 (!) 156/66 (!) 153/76  Pulse: 95 79  87  Resp: 20 16  19   Temp: 97.9 F (36.6 C) 98.2 F (36.8 C)  98.7 F (37.1 C)  TempSrc:    Oral  SpO2: 98% 96%  98%  Weight:      Height:  Latest Ref Rng & Units 06/05/2024    4:38 AM 06/04/2024    1:19 AM 06/03/2024   12:24 PM  CBC  WBC 4.0 - 10.5 K/uL 5.4  6.0  5.7   Hemoglobin 12.0 - 15.0 g/dL 87.6  89.0  88.2   Hematocrit 36.0 - 46.0 % 38.1  33.6  35.3   Platelets 150 - 400 K/uL 272  257  276        Latest Ref Rng & Units 06/05/2024    4:38 AM 06/04/2024    1:19 AM 06/03/2024   12:24 PM  BMP  Glucose 70 - 99 mg/dL 871  889  839   BUN 8 - 23 mg/dL 18  15  15    Creatinine 0.44 - 1.00 mg/dL 9.17  9.26  9.17   Sodium 135 - 145 mmol/L 139  138  137   Potassium 3.5 - 5.1 mmol/L 3.7   3.7  3.7   Chloride 98 - 111 mmol/L 103  105  103   CO2 22 - 32 mmol/L 26  25  25    Calcium 8.9 - 10.3 mg/dL 9.2  8.9  9.3      Author: Drue ONEIDA Potter, MD 06/05/2024 6:20 PM  For on call review [email or web address].

## 2024-06-05 NOTE — Discharge Instructions (Signed)
 [facility]  They will call you to schedule when they will come out to see you for nursing and Physical Therapy Home health care service [id number]  [phone] Open ? Closes 5?PM

## 2024-06-05 NOTE — TOC Initial Note (Signed)
 Transition of Care [facility]) - Initial/Assessment Note    Patient Details  Name: Barbara Mooney MRN: [id number] Date of Birth: 1934/03/22  [facility] CM/SW Contact:    Barbara Suzen Jansky, RN Phone Number: 06/05/2024, 10:44 AM  Clinical Narrative:                 Met with patient and family at bedside. Discussed discharge needs. She is agreeable to home health services. She does not have preference on agency. [facility] health referral [facility] with [facility].  Sent message to MD asking for home health orders to be placed.   Expected Discharge Plan: Home w Home Health Services Barriers to Discharge: Continued Medical Work up   Patient Goals and CMS Choice Patient states their goals for this hospitalization and ongoing recovery are:: get better CMS Medicare.gov Compare Post Acute Care list provided to:: Patient Choice offered to / list presented to : Patient      Expected Discharge Plan and Services   Discharge Planning Services: CM Consult Post Acute Care Choice: Home Health Living arrangements for the past 2 months: Single Family Home                   DME Agency: NA       HH Arranged: PT, OT HH Agency: [facility] Date [name] Agency Contacted: 06/05/24 Time HH Agency Contacted: [phone] Representative spoke with at [facility] Agency: [place]  Prior Living Arrangements/Services Living arrangements for the past 2 months: Single Family Home Lives with:: Self Patient language and need for interpreter reviewed:: Yes Do you feel safe going back to the place where you live?: Yes      Need for Family Participation in Patient Care: Yes (Comment) Care giver support system in place?: Yes (comment) Current home services: DME ([facility] (2 wheels)) Criminal Activity/Legal Involvement Pertinent to Current Situation/Hospitalization: No - Comment as needed  Activities of Daily Living      Permission Sought/Granted                  Emotional  Assessment Appearance:: Appears stated age Attitude/Demeanor/Rapport: Engaged Affect (typically observed): Accepting, Stable Orientation: : Oriented to Place, Oriented to Self, Oriented to Situation Alcohol / Substance Use: Not Applicable Psych Involvement: No (comment)  Admission diagnosis:  Intractable back pain [M54.9] Contusion of face, initial encounter [S00.83XA] Syncope, unspecified syncope type [R55] Chronic low back pain without sciatica, unspecified back pain laterality [M54.50, G89.29] Back pain [M54.9] Patient Active Problem List   Diagnosis Date Noted   Back pain 06/04/2024   Intractable back pain 06/03/2024   DNR (do not resuscitate) 06/03/2024   Abnormal urine odor 05/27/2024   Vitamin D  deficiency 04/25/2024   Spinal stenosis 04/25/2024   Dizziness 04/24/2024   Low back pain 04/10/2024   Endometrial cancer, grade I (HCC) 07/27/2023   Counseling and coordination of care 08/26/2021   Family history of breast cancer 07/21/2021   Family history of ovarian cancer 07/21/2021   Family history of colon cancer 07/21/2021   Family history of Hodgkin's disease 07/21/2021   Hyperlipemia, mixed 07/21/2021   Osteopenia 07/21/2021   Acquired trigger finger 06/11/2021   Ductal carcinoma in situ (DCIS) of right breast 03/05/2021   Bradycardia 02/01/2019   Closed Colles' fracture 09/23/2018   Follicular non-Hodgkin's lymphoma of bone (HCC) 11/16/2017   Tibial mass 10/13/2017   Malignant neoplasm of right tibia (HCC) 10/07/2017   Type 2 diabetes mellitus with complication, with long-term current use  of insulin (HCC) 08/07/2017   Complete tear of right rotator cuff 05/16/2017   Rotator cuff tendinitis, right 05/16/2017   Encounter for anticoagulation discussion and counseling 02/08/2017   Leg swelling 02/08/2017   Non-Hodgkin lymphoma (HCC) 2018   Unsteady gait    Anemia    Paroxysmal atrial fibrillation (HCC)    Essential hypertension    Sepsis (HCC) 10/28/2016    Degenerative tear of lateral meniscus, left 08/09/2016   Primary osteoarthritis of left knee 08/09/2016   Varicose veins of both legs with edema 03/17/2016   Type 2 diabetes mellitus with stage 3 chronic kidney disease, without long-term current use of insulin (HCC) 01/27/2016   Mixed incontinence 06/11/2015   External hemorrhoid 03/11/2015   Gastroesophageal reflux disease without esophagitis 03/11/2015   Onychomycosis of toenail 03/11/2015   Knee strain, right, subsequent encounter 02/28/2015   Primary osteoarthritis of right knee 02/28/2015   Vitamin B 12 deficiency 06/13/2014   Multinodular goiter (nontoxic) 09/07/2011   PCP:  Glendia Shad, MD Pharmacy:   [facility] [id number] [address], [facility] - [address] [place] [address] Phone: [phone] Fax: [phone]     Social Drivers of Health (SDOH) Social History: SDOH Screenings   Food Insecurity: No Food Insecurity (06/03/2024)  Housing: Unknown (06/03/2024)  Transportation Needs: No Transportation Needs (06/03/2024)  Utilities: At Risk (06/03/2024)  Depression (PHQ2-9): Low Risk  (04/24/2024)  Financial Resource Strain: Low Risk  (04/20/2024)  Physical Activity: Unknown (04/20/2024)  Social Connections: Moderately Integrated (06/03/2024)  Stress: No Stress Concern Present (04/20/2024)  Tobacco Use: Low Risk  (06/03/2024)   SDOH Interventions:     Readmission Risk Interventions     No data to display

## 2024-06-05 NOTE — Plan of Care (Signed)

## 2024-06-06 ENCOUNTER — Encounter: Payer: Self-pay | Admitting: Internal Medicine

## 2024-06-06 ENCOUNTER — Other Ambulatory Visit: Payer: Self-pay

## 2024-06-06 DIAGNOSIS — M549 Dorsalgia, unspecified: Secondary | ICD-10-CM

## 2024-06-06 LAB — GLUCOSE, CAPILLARY: Glucose-Capillary: 129 mg/dL — ABNORMAL HIGH (ref 70–99)

## 2024-06-06 LAB — CBC WITH DIFFERENTIAL/PLATELET
Abs Immature Granulocytes: 0.02 10*3/uL (ref 0.00–0.07)
Basophils Absolute: 0 10*3/uL (ref 0.0–0.1)
Basophils Relative: 1 %
Eosinophils Absolute: 0.2 10*3/uL (ref 0.0–0.5)
Eosinophils Relative: 4 %
HCT: 36.5 % (ref 36.0–46.0)
Hemoglobin: 12 g/dL (ref 12.0–15.0)
Immature Granulocytes: 0 %
Lymphocytes Relative: 4 %
Lymphs Abs: 0.2 10*3/uL — ABNORMAL LOW (ref 0.7–4.0)
MCH: 30.2 pg (ref 26.0–34.0)
MCHC: 32.9 g/dL (ref 30.0–36.0)
MCV: 91.7 fL (ref 80.0–100.0)
Monocytes Absolute: 0.5 10*3/uL (ref 0.1–1.0)
Monocytes Relative: 8 %
Neutro Abs: 4.8 10*3/uL (ref 1.7–7.7)
Neutrophils Relative %: 83 %
Platelets: 281 10*3/uL (ref 150–400)
RBC: 3.98 MIL/uL (ref 3.87–5.11)
RDW: 14.2 % (ref 11.5–15.5)
WBC: 5.7 10*3/uL (ref 4.0–10.5)
nRBC: 0 % (ref 0.0–0.2)

## 2024-06-06 LAB — BASIC METABOLIC PANEL WITH GFR
Anion gap: 9 (ref 5–15)
BUN: 17 mg/dL (ref 8–23)
CO2: 24 mmol/L (ref 22–32)
Calcium: 9.1 mg/dL (ref 8.9–10.3)
Chloride: 103 mmol/L (ref 98–111)
Creatinine, Ser: 0.75 mg/dL (ref 0.44–1.00)
GFR, Estimated: >60 mL/min (ref >60)
Glucose, Bld: 109 mg/dL — ABNORMAL HIGH (ref 70–99)
Potassium: 3.4 mmol/L — ABNORMAL LOW (ref 3.5–5.1)
Sodium: 136 mmol/L (ref 135–145)

## 2024-06-06 MED ORDER — METHOCARBAMOL 500 MG PO TABS: 500.0000 mg | ORAL_TABLET | Freq: Three times a day (TID) | ORAL | Status: DC | PRN

## 2024-06-06 MED ORDER — DOCUSATE SODIUM 100 MG PO CAPS
100.0000 mg | ORAL_CAPSULE | Freq: Two times a day (BID) | ORAL | 0 refills | Status: AC
  Filled 2024-06-06: qty 10, 5d supply, fill #0

## 2024-06-06 MED ORDER — POLYETHYLENE GLYCOL 3350 17 GM/SCOOP PO POWD
17.0000 g | Freq: Every day | ORAL | 0 refills | Status: AC | PRN
  Filled 2024-06-06: qty 238, 14d supply, fill #0

## 2024-06-06 MED ORDER — POTASSIUM CHLORIDE 20 MEQ PO PACK
40.0000 meq | PACK | Freq: Once | ORAL | Status: AC
  Administered 2024-06-06: 40 meq via ORAL
  Filled 2024-06-06: qty 2

## 2024-06-06 MED ORDER — BISACODYL 5 MG PO TBEC
5.0000 mg | DELAYED_RELEASE_TABLET | Freq: Every day | ORAL | 0 refills | Status: DC | PRN
  Filled 2024-06-06: qty 30, 30d supply, fill #0

## 2024-06-06 MED ORDER — IBUPROFEN 200 MG PO TABS
600.0000 mg | ORAL_TABLET | Freq: Four times a day (QID) | ORAL | 0 refills | Status: AC | PRN
  Filled 2024-06-06: qty 16, 2d supply, fill #0

## 2024-06-06 MED ORDER — METHOCARBAMOL 500 MG PO TABS
500.0000 mg | ORAL_TABLET | Freq: Three times a day (TID) | ORAL | 0 refills | Status: DC | PRN
Start: 2024-06-06 — End: 2024-06-15
  Filled 2024-06-06: qty 10, 4d supply, fill #0

## 2024-06-06 MED ORDER — ACETAMINOPHEN 500 MG PO TABS
500.0000 mg | ORAL_TABLET | Freq: Three times a day (TID) | ORAL | 0 refills | Status: AC
  Filled 2024-06-06: qty 30, 10d supply, fill #0

## 2024-06-06 MED ORDER — OXYCODONE HCL 5 MG PO TABS
5.0000 mg | ORAL_TABLET | ORAL | 0 refills | Status: DC | PRN
  Filled 2024-06-06: qty 10, 2d supply, fill #0

## 2024-06-06 NOTE — Progress Notes (Signed)
 Patient discharging home all belongings sent with patient. IV removed. Discharge education and instructions provided, all questions answered.

## 2024-06-06 NOTE — TOC Transition Note (Signed)
 Transition of Care [facility]) - Discharge Note   Patient Details  Name: Barbara Mooney MRN: [id number] Date of Birth: March 13, 1934  [facility]) CM/SW Contact:  Quintella Suzen Jansky, RN Phone Number: 06/06/2024, 10:07 AM   Clinical Narrative:     Patient to discharge today, home with home health services. Patient set up with [facility]; agency information added to AVS.   Final next level of care: Home w Home Health Services Barriers to Discharge: Barriers Resolved   Patient Goals and CMS Choice Patient states their goals for this hospitalization and ongoing recovery are:: get better CMS Medicare.gov Compare Post Acute Care list provided to:: Patient Choice offered to / list presented to : Patient      Discharge Placement                  Name of family member notified: [name] Patient and family notified of of transfer: 06/06/24  Discharge Plan and Services Additional resources added to the After Visit Summary for     Discharge Planning Services: CM Consult Post Acute Care Choice: Home Health            DME Agency: NA       HH Arranged: PT, OT HH Agency: [facility] Date [facility] Agency Contacted: 06/05/24 Time HH Agency Contacted: [phone] Representative spoke with at [facility] Agency: [place]  Social Drivers of Health (SDOH) Interventions SDOH Screenings   Food Insecurity: No Food Insecurity (06/03/2024)  Housing: Unknown (06/03/2024)  Transportation Needs: No Transportation Needs (06/03/2024)  Utilities: At Risk (06/03/2024)  Depression (PHQ2-9): Low Risk  (04/24/2024)  Financial Resource Strain: Low Risk  (04/20/2024)  Physical Activity: Unknown (04/20/2024)  Social Connections: Moderately Integrated (06/03/2024)  Stress: No Stress Concern Present (04/20/2024)  Tobacco Use: Low Risk  (06/03/2024)     Readmission Risk Interventions     No data to display

## 2024-06-06 NOTE — Telephone Encounter (Signed)
 See MyChart encounter

## 2024-06-06 NOTE — Discharge Summary (Signed)
 Physician Discharge Summary   Patient: Barbara Mooney MRN: [id number] DOB: 03-Apr-1934  Admit date:     06/03/2024  Discharge date: 06/06/24  Discharge Physician: Drue ONEIDA Potter   PCP: Glendia Shad, MD   Recommendations at discharge:  Follow-up with IR  Discharge Diagnoses:  Intractable back pain Stage 3a CKD DM HTN Afib  Hospital Course:  Barbara Mooney is a 89 y.o. female with medical history significant of DCIS, endometrial CA, stage 3a CKD, NHL, DM, HTN, HLD, hypothyroidism, and afib on Eliquis  who presented on 6/22 with a fall.   Patient currently being managed for intractable back pain with spinal stenosis most severe at L4-L5.  IR initially planned facets injection inpatient, but this is now being planned as an outpatient.  IR has made arrangements for this to be done on Friday.  Patient is therefore being discharged today to follow-up with interventional radiology as well as pain clinic.  Consultants: Interventional radiology, neurosurgery Procedures performed: None Disposition: Home health Diet recommendation:  Cardiac diet DISCHARGE MEDICATION: Allergies as of 06/06/2024       Reactions   Nitrofurantoin  Diarrhea, Nausea Only, Other (See Comments)   Sulfa Antibiotics Nausea Only, Other (See Comments)        Medication List     STOP taking these medications    apixaban  5 MG Tabs tablet Commonly known as: Eliquis        TAKE these medications    acetaminophen  500 MG tablet Commonly known as: TYLENOL  Take 1 tablet (500 mg total) by mouth 3 (three) times daily.   bisacodyl 5 MG EC tablet Commonly known as: DULCOLAX Take 1 tablet (5 mg total) by mouth daily as needed for moderate constipation.   BLUE-EMU HEMP EX Apply 1 application topically daily as needed (Knee pain).   CRANBERRY PO Take 1 capsule by mouth 2 (two) times daily.   cyanocobalamin  1000 MCG/ML injection Commonly known as: VITAMIN B12 Inject 1,000 mcg into the muscle every 30  (thirty) days.   diltiazem  180 MG 24 hr capsule Commonly known as: CARDIZEM  CD Take 1 capsule (180 mg total) by mouth 2 (two) times daily.   docusate sodium  100 MG capsule Commonly known as: COLACE Take 1 capsule (100 mg total) by mouth 2 (two) times daily.   furosemide  20 MG tablet Commonly known as: LASIX  Take 1 tablet (20 mg total) by mouth daily as needed for fluid or edema (SOB).   gabapentin 100 MG capsule Commonly known as: NEURONTIN Take 100 mg by mouth at bedtime as needed.   ibuprofen  200 MG tablet Commonly known as: Motrin  IB Take 3 tablets (600 mg total) by mouth every 6 (six) hours as needed for up to 4 days.   LACTOBACILLUS PO Take 1 tablet by mouth daily at 6 (six) AM.   metFORMIN  500 MG tablet Commonly known as: GLUCOPHAGE  Take 500 mg by mouth 2 (two) times daily with a meal.   methocarbamol 500 MG tablet Commonly known as: ROBAXIN Take 1 tablet (500 mg total) by mouth every 8 (eight) hours as needed for muscle spasms.   olmesartan  40 MG tablet Commonly known as: BENICAR  TAKE 1 TABLET BY MOUTH EVERY DAY   omeprazole  20 MG capsule Commonly known as: PRILOSEC TAKE 1 CAPSULE BY MOUTH EVERY DAY   ondansetron  8 MG tablet Commonly known as: ZOFRAN  Take 1 tablet (8 mg total) by mouth every 8 (eight) hours as needed for nausea or vomiting.   oxyCODONE  5 MG immediate release tablet Commonly known  as: Oxy IR/ROXICODONE  Take 1 tablet (5 mg total) by mouth every 4 (four) hours as needed for moderate pain (pain score 4-6).   polyethylene glycol powder 17 GM/SCOOP powder Commonly known as: GLYCOLAX/MIRALAX Take 17 g by mouth daily as needed for mild constipation.   pregabalin 25 MG capsule Commonly known as: LYRICA Take 25 mg by mouth daily.   Vitamin D  (Ergocalciferol ) 1.25 MG (50000 UNIT) Caps capsule Commonly known as: DRISDOL  Take 1 capsule (50,000 Units total) by mouth every 7 (seven) days.        Discharge Exam: Filed Weights   06/03/24 1620  06/03/24 1649  Weight: 73 kg 73 kg   General:  Appears calm AND comfortable back pain better Eyes:  PERRL, EOMI, normal lids, iris ENT:  grossly normal hearing, lips & tongue, mmm Cardiovascular:  Irregularly irregular, rate controlled. No LE edema.  Respiratory:   CTA bilaterally with no wheezes/rales/rhonchi.  Normal respiratory effort. Abdomen:  soft, NT, ND Skin:  no rash or induration seen on limited exam Musculoskeletal:  L > RLE , no bony abnormality Psychiatric: blunted mood and affect, speech fluent and appropriate, AOx3 Neurologic:  CN 2-12 grossly intact, moves all extremities in coordinated fashion    Condition at discharge: good  The results of significant diagnostics from this hospitalization (including imaging, microbiology, ancillary and laboratory) are listed below for reference.   Imaging Studies: CT Lumbar Spine Wo Contrast Result Date: 06/03/2024 CLINICAL DATA:  Trauma, fall, acute on chronic lower back pain. EXAM: CT LUMBAR SPINE WITHOUT CONTRAST TECHNIQUE: Multidetector CT imaging of the lumbar spine was performed without intravenous contrast administration. Multiplanar CT image reconstructions were also generated. RADIATION DOSE REDUCTION: This exam was performed according to the departmental dose-optimization program which includes automated exposure control, adjustment of the mA and/or kV according to patient size and/or use of iterative reconstruction technique. COMPARISON:  MRI lumbar spine 05/09/2010. FINDINGS: Segmentation: 5 lumbar type vertebrae. Alignment: Lumbar lordosis is maintained. Similar grade 1 anterolisthesis of L4 on L5. Vertebrae: No compression fracture or displaced fracture in the lumbar spine. No suspicious lytic or blastic osseous lesion. Degenerative endplate changes and endplate osteophytes most pronounced at L1-2 and L4-5. Paraspinal and other soft tissues: The paraspinal soft tissues are unremarkable. Extensive atherosclerosis of the abdominal  aorta and branch vessels. Disc levels: Mild intervertebral disc space narrowing at L1-2. Severe disc space narrowing at L4-5 with associated endplate sclerosis and irregularity. Small disc bulges at multiple levels. Disc bulge, facet arthrosis, and thickening of the ligamentum flavum at L3-4 resulting in moderate spinal canal stenosis. Additional disc bulge at L4-5 along with moderate facet arthrosis and thickening of the ligamentum flavum resulting in at least moderate spinal canal stenosis likely with lateral recess narrowing. There is moderate bilateral foraminal stenosis at L4-5. IMPRESSION: No acute fracture or traumatic malalignment of the lumbar spine. Degenerative changes as above. Moderate spinal canal stenosis at L3-4 and L4-5. Foraminal stenosis greatest at L4-5. Similar grade 1 anterolisthesis of L4 on L5. Electronically Signed   By: Donnice Mania M.D.   On: 06/03/2024 13:55   DG Hand Complete Right Result Date: 06/03/2024 CLINICAL DATA:  Pain. EXAM: RIGHT HAND - COMPLETE 3 VIEW COMPARISON:  None Available. FINDINGS: Distal interphalangeal degenerative changes with joint space narrowing and osteophytes. No acute fracture, dislocation or subluxation. No osteolytic or osteoblastic lesions. IMPRESSION: Degenerative changes. Electronically Signed   By: Fonda Field M.D.   On: 06/03/2024 13:24   CT Cervical Spine Wo Contrast Result Date: 06/03/2024  EXAM: CT CERVICAL SPINE WITHOUT CONTRAST 06/03/2024 12:50:40 PM TECHNIQUE: CT of the cervical was performed without the administration of intravenous contrast. Multiplanar reformatted images are provided for review. Automated exposure control, iterative reconstruction, and/or weight based adjustment of the mA/kV was utilized to reduce the radiation dose to as low as reasonably achievable. COMPARISON: None available. CLINICAL HISTORY: Fall. Pt states she thinks she passed out. Pt has some back issues and had shots for that. Pt has been taking meds for leg  weakness and back pain. Pt did hit her head and is on thinners. FINDINGS: CERVICAL SPINE: BONES AND ALIGNMENT: Anterior osteophytes are fused C3-6. Large overhanging anterior osteophytes are present at C6-7. Straightening of the normal cervical lordosis is present. Ossification of the posterior spinal ligament is noted, likely related to remote trauma. DEGENERATIVE CHANGES: Degenerative changes as described above. SOFT TISSUES: No prevertebral soft tissue swelling. VASCULATURE: Atherosclerotic calcifications are present in the proximal great vessels. No aneurysm or stenosis is evident. LUNGS: Patchy ground-glass attenuation is present in both lungs. IMPRESSION: 1. No acute abnormality of the cervical spine related to the fall. 2. Straightening of the normal cervical lordosis. 3. Ossification of the posterior spinal ligament, likely related to remote trauma. Electronically signed by: Lonni Necessary MD 06/03/2024 01:06 PM EDT RP Workstation: [id number]   CT Maxillofacial Wo Contrast Result Date: 06/03/2024 EXAM: CT OF THE FACE WITHOUT CONTRAST 06/03/2024 12:50:40 PM TECHNIQUE: CT of the face was performed without the administration of intravenous contrast. Multiplanar reformatted images are provided for review. Automated exposure control, iterative reconstruction, and/or weight based adjustment of the mA/kV was utilized to reduce the radiation dose to as low as reasonably achievable. COMPARISON: None available. CLINICAL HISTORY: Fall. Pt states she thinks she passed out. Pt has some back issues and had shots for that. Pt has been taking meds for leg weakness and back pain. Pt did hit her head and is on thinners. FINDINGS: FACIAL BONES: No acute facial fracture. No mandibular dislocation. No suspicious bone lesion. ORBITS: Bilateral lens replacements are noted. The globes and orbits are otherwise within normal limits. SINUSES AND MASTOIDS: A polyp or mucous retention cyst is present in the inferior left  maxillary sinus and in a posterior right ethmoid air cell. SOFT TISSUES: No acute abnormality. IMPRESSION: 1. No acute facial fracture or significant soft tissue injury. Electronically signed by: Lonni Necessary MD 06/03/2024 01:03 PM EDT RP Workstation: [id number]   CT Head Wo Contrast Result Date: 06/03/2024 EXAM: CT HEAD WITHOUT CONTRAST 06/03/2024 12:50:40 PM TECHNIQUE: CT of the head was performed without the administration of intravenous contrast. Automated exposure control, iterative reconstruction, and/or weight based adjustment of the mA/kV was utilized to reduce the radiation dose to as low as reasonably achievable. COMPARISON: None available. CLINICAL HISTORY: Fall. Pt hit her head and is on blood thinners. FINDINGS: BRAIN AND VENTRICLES: No acute hemorrhage. Gray-white differentiation is preserved. No hydrocephalus. No extra-axial collection. No mass effect or midline shift. Moderate and confluent periventricular white matter hypoattenuation is more prominent anteriorly than posteriorly. ORBITS: No acute abnormality. Inserted lens replacement. SINUSES: A polyp or mixed retention cyst is present in a posterior right ethmoid air cell. The paranasal sinuses are otherwise clear. SOFT TISSUES AND SKULL: No acute soft tissue abnormality. No skull fracture. Atherosclerotic calcifications are present in the cavernous carotid arteries bilaterally and at the dural margin of both vertebral arteries. No hyperdense vessel is present. The mastoid air cells are clear. IMPRESSION: 1. No acute intracranial abnormality related to the fall. 2.  Moderate and confluent periventricular white matter hypoattenuation, more prominent anteriorly than posteriorly. This likely reflects the sequelae of chronic microvascular ischemia. Electronically signed by: Lonni Necessary MD 06/03/2024 01:01 PM EDT RP Workstation: [id number]    Microbiology: Results for orders placed or performed in visit on 05/22/24  Urine Culture      Status: Abnormal   Collection Time: 05/22/24  4:03 PM   Specimen: Urine   Urine  Result Value Ref Range Status   Urine Culture, Routine Final report (A)  Final   Organism ID, Bacteria Escherichia coli (A)  Final    Comment: Cefazolin  with an MIC <=16 predicts susceptibility to the oral agents cefaclor, cefdinir , cefpodoxime, cefprozil, cefuroxime , cephalexin, and loracarbef when used for therapy of uncomplicated urinary tract infections due to E. coli, Klebsiella pneumoniae, and Proteus mirabilis. Greater than 100,000 colony forming units per mL    Antimicrobial Susceptibility Comment  Final    Comment:       ** S = Susceptible; I = Intermediate; R = Resistant **                    P = Positive; N = Negative             MICS are expressed in micrograms per mL    Antibiotic                 RSLT#1    RSLT#2    RSLT#3    RSLT#4 Amoxicillin /Clavulanic Acid    S Ampicillin                     S Cefazolin                       S Cefepime                       S Cefoxitin                      S Cefpodoxime                    S Ceftriaxone                     S Ciprofloxacin                   S Ertapenem                      S Gentamicin                     S Levofloxacin                   S Meropenem                       S Nitrofurantoin                  S Piperacillin/Tazobactam        S Tetracycline                   S Tobramycin                     S Trimethoprim/Sulfa             S   Microscopic Examination     Status: Abnormal   Collection Time: 05/22/24  4:03 PM   Urine  Result Value Ref Range Status   WBC, UA >30 (A) 0 - 5 /hpf Final   RBC, Urine None seen 0 - 2 /hpf Final   Epithelial Cells (non renal) 0-10 0 - 10 /hpf Final   Casts None seen None seen /lpf Final   Bacteria, UA Many (A) None seen/Few Final    Labs: CBC: Recent Labs  Lab 06/03/24 1224 06/04/24 0119 06/05/24 0438 06/06/24 0348  WBC 5.7 6.0 5.4 5.7  NEUTROABS  --   --  4.5 4.8  HGB 11.7* 10.9*  12.3 12.0  HCT 35.3* 33.6* 38.1 36.5  MCV 92.9 93.1 93.2 91.7  PLT 276 257 272 281   Basic Metabolic Panel: Recent Labs  Lab 06/03/24 1224 06/04/24 0119 06/05/24 0438 06/06/24 0348  NA 137 138 139 136  K 3.7 3.7 3.7 3.4*  CL 103 105 103 103  CO2 25 25 26 24   GLUCOSE 160* 110* 128* 109*  BUN 15 15 18 17   CREATININE 0.82 0.73 0.82 0.75  CALCIUM 9.3 8.9 9.2 9.1   Liver Function Tests: Recent Labs  Lab 06/03/24 1224  AST 15  ALT 12  ALKPHOS 113  BILITOT 0.6  PROT 6.4*  ALBUMIN 3.2*   CBG: Recent Labs  Lab 06/04/24 2111 06/05/24 0725 06/05/24 1158 06/05/24 2128 06/06/24 0821  GLUCAP 111* 115* 106* 185* 129*    Discharge time spent:  37 minutes.  Signed: Drue ONEIDA Potter, MD [facility] 06/06/2024

## 2024-06-07 ENCOUNTER — Other Ambulatory Visit: Payer: Self-pay | Admitting: Internal Medicine

## 2024-06-07 DIAGNOSIS — G8929 Other chronic pain: Secondary | ICD-10-CM

## 2024-06-07 DIAGNOSIS — M47896 Other spondylosis, lumbar region: Secondary | ICD-10-CM

## 2024-06-07 DIAGNOSIS — M48061 Spinal stenosis, lumbar region without neurogenic claudication: Secondary | ICD-10-CM

## 2024-06-07 NOTE — Addendum Note (Signed)
 Addended by: LEARTA PORTO D on: 06/07/2024 12:10 PM   Modules accepted: Orders

## 2024-06-07 NOTE — Telephone Encounter (Signed)
 Referral has been sent. Daughter is aware. Will follow up with daughter this PM.

## 2024-06-07 NOTE — Telephone Encounter (Signed)
 Are you ok with placing referral for them?

## 2024-06-07 NOTE — Telephone Encounter (Signed)
 Left detailed message for daughter

## 2024-06-07 NOTE — Telephone Encounter (Signed)
 Copied from CRM [id number]. Topic: General - Other >> Jun 07, 2024 11:13 AM Burnard DEL wrote: Reason for CRM: Patients daughter [name] called in requesting a phone call from [facility] Sueanne she/her/hers stated that she has a few questions in regards to a referral for her mom and other questions.

## 2024-06-07 NOTE — Discharge Instructions (Signed)

## 2024-06-07 NOTE — Telephone Encounter (Signed)
 Confirmed with pain management that referral has been received and sent back for provider to review.

## 2024-06-07 NOTE — Telephone Encounter (Signed)
 Ok to place urgent order for referral to pain clinic. See information in message.

## 2024-06-08 ENCOUNTER — Ambulatory Visit
Admission: RE | Admit: 2024-06-08 | Discharge: 2024-06-08 | Disposition: A | Discharge status: Home / Self Care | Source: Ambulatory Visit | Attending: Internal Medicine | Admitting: Internal Medicine

## 2024-06-08 ENCOUNTER — Inpatient Hospital Stay
Admit: 2024-06-08 | Discharge: 2024-06-08 | Disposition: A | Discharge status: Home / Self Care | Attending: Internal Medicine | Admitting: Internal Medicine

## 2024-06-08 DIAGNOSIS — M545 Low back pain, unspecified: Secondary | ICD-10-CM

## 2024-06-08 DIAGNOSIS — M48061 Spinal stenosis, lumbar region without neurogenic claudication: Secondary | ICD-10-CM

## 2024-06-08 DIAGNOSIS — M47896 Other spondylosis, lumbar region: Secondary | ICD-10-CM

## 2024-06-08 MED ORDER — IOPAMIDOL (ISOVUE-M 200) INJECTION 41%
1.0000 mL | Freq: Once | INTRAMUSCULAR | Status: AC
  Administered 2024-06-08: 1 mL via INTRA_ARTICULAR

## 2024-06-08 MED ORDER — METHYLPREDNISOLONE ACETATE 40 MG/ML INJ SUSP (RADIOLOG
80.0000 mg | Freq: Once | INTRAMUSCULAR | Status: AC
  Administered 2024-06-08: 80 mg via INTRA_ARTICULAR

## 2024-06-08 NOTE — Discharge Instructions (Signed)

## 2024-06-10 ENCOUNTER — Encounter: Payer: Self-pay | Admitting: Internal Medicine

## 2024-06-11 ENCOUNTER — Other Ambulatory Visit: Payer: Self-pay | Admitting: Radiation Oncology

## 2024-06-11 ENCOUNTER — Other Ambulatory Visit: Payer: Self-pay

## 2024-06-11 ENCOUNTER — Ambulatory Visit

## 2024-06-11 DIAGNOSIS — N1831 Chronic kidney disease, stage 3a: Secondary | ICD-10-CM

## 2024-06-11 MED ORDER — BLOOD GLUCOSE TEST VI STRP: ORAL_STRIP | 12 refills | Status: DC

## 2024-06-11 MED ORDER — LANCETS MISC. MISC: 12 refills | Status: AC

## 2024-06-11 MED ORDER — BLOOD GLUCOSE MONITORING SUPPL DEVI
1.0000 | Freq: Every day | 0 refills | Status: DC
Start: 2024-06-11 — End: 2024-08-14

## 2024-06-11 MED ORDER — ONDANSETRON HCL 8 MG PO TABS: 8.0000 mg | ORAL_TABLET | Freq: Two times a day (BID) | ORAL | 0 refills | Status: AC | PRN

## 2024-06-11 MED ORDER — LANCET DEVICE MISC: 0 refills | Status: AC

## 2024-06-11 NOTE — Telephone Encounter (Signed)
 I have sent in the order for zofran  to have if needed. Please confirm she has test strips. See previous note.   Thanks

## 2024-06-11 NOTE — Telephone Encounter (Signed)
 Dr Lenn last refilled zofran . Ok to send in refill for her to have on hand PRN?

## 2024-06-14 ENCOUNTER — Encounter: Payer: Self-pay | Admitting: Internal Medicine

## 2024-06-14 ENCOUNTER — Other Ambulatory Visit

## 2024-06-14 ENCOUNTER — Telehealth (INDEPENDENT_AMBULATORY_CARE_PROVIDER_SITE_OTHER): Admitting: Internal Medicine

## 2024-06-14 ENCOUNTER — Telehealth: Payer: Self-pay | Admitting: Internal Medicine

## 2024-06-14 ENCOUNTER — Ambulatory Visit: Payer: Self-pay | Admitting: Internal Medicine

## 2024-06-14 VITALS — BP 126/64 | HR 88 | Ht 66.0 in | Wt 160.0 lb

## 2024-06-14 DIAGNOSIS — I10 Essential (primary) hypertension: Secondary | ICD-10-CM | POA: Diagnosis not present

## 2024-06-14 DIAGNOSIS — R8290 Unspecified abnormal findings in urine: Secondary | ICD-10-CM

## 2024-06-14 DIAGNOSIS — M549 Dorsalgia, unspecified: Secondary | ICD-10-CM

## 2024-06-14 LAB — URINALYSIS, MICROSCOPIC ONLY

## 2024-06-14 LAB — POCT URINALYSIS DIPSTICK
Bilirubin, UA: NEGATIVE
Glucose, UA: NEGATIVE
Ketones, UA: NEGATIVE
Leukocytes, UA: NEGATIVE
Nitrite, UA: NEGATIVE
Protein, UA: NEGATIVE
Spec Grav, UA: 1.015 (ref 1.010–1.025)
Urobilinogen, UA: 0.2 U/dL
pH, UA: 7 (ref 5.0–8.0)

## 2024-06-14 NOTE — Telephone Encounter (Signed)
 My chart message sent to Ms Barbara Mooney for update.

## 2024-06-14 NOTE — Addendum Note (Signed)
 Addended by: LEARTA PORTO D on: 06/14/2024 12:34 PM   Modules accepted: Orders

## 2024-06-14 NOTE — Telephone Encounter (Signed)
 Daughter is coming to drop off urine before lunch. Virtual at 4:30 has been scheduled. Urine orders placed.

## 2024-06-14 NOTE — Telephone Encounter (Signed)
 Work in today as discussed. Needs to come in for urinalysis and culture.

## 2024-06-14 NOTE — Progress Notes (Signed)
 Patient ID: Barbara Mooney, female   DOB: 01-Nov-1934, 89 y.o.   MRN: [id number]   Virtual Visit via video Note  I connected with Lenzi  Manwarren by a video enabled telemedicine application and verified that I am speaking with the correct person using two identifiers. Location patient: home Location provider: work Persons participating in the virtual visit: patient, provider and pts daughter [name]  The limitations, risks, security and privacy concerns of performing an evaluation and management service by video and the availability of in person appointments have been discussed. It has also been discussed with the patient that there may be a patient responsible charge related to this service. The patient expressed understanding and agreed to proceed.   Reason for visit: work in appt  HPI: Work in with concerns regarding a possible UTI. Recently admitted 06/03/24 - 06/06/24. Presented after a fall. Admitted for management of intractable back pain with spinal stenosis most severe at L4-:L5. S/p facet injection - Friday 06/08/24. Pain is better. Daughter reports that starting over the last couple of days, she noticed her being more lethargic. Some jittery feeling - inside. Reported a little dizziness. Most recent blood pressures - 126-134/64-67 with pulse 76-88. Low grade temp - 99-100. Taking tylenol . Has been more anxious. Daughter noticed a different odor to her urine last night. No vomiting. Some nausea. Has a history of intermittent nausea. She is eating. Reports feeling better now.    ROS: See pertinent positives and negatives per HPI.  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 of the RIGHT proximal tibia s/p XRT   GERD (gastroesophageal reflux disease)    History of hiatal hernia    HLD (hyperlipidemia)    HOH (hard of hearing)    Hypertension    Hypothyroidism    Mixed incontinence    MRSA infection    a.) remote history; posterior torso   Multiple thyroid  nodules    Peripheral edema    Permanent atrial fibrillation (HCC) 10/2016   a.) CHA2DS2VASc = 6 (age x2, sex, HTN, vascular disease history, T2DM);  b.) s/p DCCV 01/20/2017 (150 J x 1) --> failed; c.) rate/rhythm maintained on oral diltiazem ; chronically anticoagulated with apixaban    RBBB (right bundle branch block)    Recurrent UTI (urinary tract infection)    Unsteady gait    Varicose veins of both lower extremities    Vitamin B12 deficiency     Past Surgical History:  Procedure Laterality Date   ABDOMINAL HYSTERECTOMY  2024   BONE BIOPSY Right 2018   tibia   BREAST BIOPSY Right 02/26/2021   ffirm bx-X clip-HIGH-GRADE DUCTAL CARCINOMA IN SITU   BREAST LUMPECTOMY Right 03/09/2021   Procedure: BREAST LUMPECTOMY;  Surgeon: Dessa Reyes ORN, MD;  Location: [facility];  Service: General;  Laterality: Right;   CARDIOVERSION N/A 01/20/2017  Procedure: CARDIOVERSION;  Surgeon: Evalene JINNY Lunger, MD;  Location: [facility];  Service: Cardiovascular;  Laterality: N/A;   CATARACT EXTRACTION W/PHACO Right 02/15/2018   Procedure: CATARACT EXTRACTION PHACO AND INTRAOCULAR LENS PLACEMENT (IOC);  Surgeon: Jaye Fallow, MD;  Location: [facility];  Service: Ophthalmology;  Laterality: Right;  US  01:22.8 AP% 18.9 CDE 15.62 Fluid Pack Lot # [id number]   CATARACT EXTRACTION W/PHACO Left 03/08/2018   Procedure: CATARACT  EXTRACTION PHACO AND INTRAOCULAR LENS PLACEMENT (IOC);  Surgeon: Jaye Fallow, MD;  Location: [facility];  Service: Ophthalmology;  Laterality: Left;  US  01:05 AP% 15.8 CDE 10.28 Fluid pak lot # [id number]   COLONOSCOPY     EYE SURGERY     Catara you   TOTAL LAPAROSCOPIC HYSTERECTOMY WITH BILATERAL SALPINGO OOPHORECTOMY N/A 07/27/2023   Procedure: TOTAL LAPAROSCOPIC HYSTERECTOMY WITH BILATERAL SALPINGO OOPHORECTOMY, SENTINEL LYMPH NODE INJECTION AND MAPPING, NODE DISSECTION;  Surgeon: Mancil Barter, MD;  Location: [facility];  Service: Gynecology;  Laterality: N/A;    Family History  Problem Relation Age of Onset   Diabetes Mother    Arthritis Father    Hypertension Father    Breast cancer Sister 32   Stroke Sister    Atrial fibrillation Sister    Cancer Brother    Heart disease Brother    Ovarian cancer Maternal Aunt    Colon cancer Maternal Aunt    Kidney cancer Neg Hx    Bladder Cancer Neg Hx     SOCIAL HX: reviewed.    Current Outpatient Medications:    acetaminophen  (TYLENOL ) 500 MG tablet, Take 1 tablet (500 mg total) by mouth 3 (three) times daily., Disp: 30 tablet, Rfl: 0   bisacodyl  (DULCOLAX) 5 MG EC tablet, Take 1 tablet (5 mg total) by mouth daily as needed for moderate constipation., Disp: 30 tablet, Rfl: 0   Blood Glucose Monitoring Suppl DEVI, 1 each by Does not apply route daily. May substitute to any manufacturer covered by patient's insurance., Disp: 1 each, Rfl: 0   CRANBERRY PO, Take 1 capsule by mouth 2 (two) times daily. , Disp: , Rfl:    cyanocobalamin  (,VITAMIN B-12,) 1000 MCG/ML injection, Inject 1,000 mcg into the muscle every 30 (thirty) days. , Disp: , Rfl:    diltiazem  (CARDIZEM  CD) 180 MG 24 hr capsule, Take 1 capsule (180 mg total) by mouth 2 (two) times daily., Disp: 180 capsule, Rfl: 3   docusate sodium  (COLACE) 100 MG capsule, Take 1 capsule (100 mg total) by mouth 2 (two) times daily., Disp: 10 capsule, Rfl: 0   furosemide  (LASIX ) 20 MG  tablet, Take 1 tablet (20 mg total) by mouth daily as needed for fluid or edema (SOB)., Disp: 90 tablet, Rfl: 3   Glucose Blood (BLOOD GLUCOSE TEST STRIPS) STRP, Use to check blood sugars once daily., Disp: 100 strip, Rfl: 12   LACTOBACILLUS PO, Take 1 tablet by mouth daily at 6 (six) AM., Disp: , Rfl:    Lancet Device MISC, Use to check blood sugars once daily., Disp: 1 each, Rfl: 0   Lancets Misc. MISC, Use to check blood sugars once daily., Disp: 100 each, Rfl: 12   metFORMIN  (GLUCOPHAGE ) 500 MG tablet, Take 500 mg by mouth 2 (two) times daily with a meal., Disp: , Rfl:    olmesartan  (BENICAR ) 40 MG tablet, TAKE 1 TABLET BY MOUTH EVERY DAY, Disp: 90 tablet, Rfl: 3   omeprazole  (PRILOSEC) 20 MG capsule, TAKE 1 CAPSULE BY MOUTH EVERY DAY, Disp: 90 capsule, Rfl: 1  ondansetron  (ZOFRAN ) 8 MG tablet, Take 1 tablet (8 mg total) by mouth 2 (two) times daily as needed for nausea or vomiting., Disp: 20 tablet, Rfl: 0   polyethylene glycol powder (GLYCOLAX /MIRALAX ) 17 GM/SCOOP powder, Take 17 g by mouth daily as needed for mild constipation., Disp: 238 g, Rfl: 0   Trolamine Salicylate (BLUE-EMU HEMP EX), Apply 1 application topically daily as needed (Knee pain)., Disp: , Rfl:    Vitamin D , Ergocalciferol , (DRISDOL ) 1.25 MG (50000 UNIT) CAPS capsule, Take 1 capsule (50,000 Units total) by mouth every 7 (seven) days., Disp: 12 capsule, Rfl: 0  EXAM:  GENERAL: alert, oriented, appears well and in no acute distress  HEENT: atraumatic, conjunttiva clear, no obvious abnormalities on inspection of external nose and ears  NECK: normal movements of the head and neck  LUNGS: on inspection no signs of respiratory distress, breathing rate appears normal, no obvious gross SOB, gasping or wheezing  CV: no obvious cyanosis  PSYCH/NEURO: pleasant and cooperative, no obvious depression or anxiety, speech and thought processing grossly intact  ASSESSMENT AND PLAN:  Discussed the following assessment and  plan:  Problem List Items Addressed This Visit     Intractable back pain   S/p facet injection as outlined. Pain is better.       Essential hypertension   Continues on olmesartan  and diltiazem . Blood pressure as outlined.       Abnormal urine odor - Primary   Has noticed odor change and recent symptoms as outlined. Urine dip - negative. Urine micro - negative rbc's and wbc's. She is feeling better currently. Eating. Discussed staying hydrated. Will hold on abx at this time. Await culture results. Follow.  Call with update.        Return if symptoms worsen or fail to improve.   I discussed the assessment and treatment plan with the patient. The patient was provided an opportunity to ask questions and all were answered. The patient agreed with the plan and demonstrated an understanding of the instructions.   The patient was advised to call back or seek an in-person evaluation if the symptoms worsen or if the condition fails to improve as anticipated.    Allena Hamilton, MD

## 2024-06-15 ENCOUNTER — Encounter: Payer: Self-pay | Admitting: Internal Medicine

## 2024-06-15 NOTE — Assessment & Plan Note (Signed)
 Continues on olmesartan  and diltiazem . Blood pressure as outlined.

## 2024-06-15 NOTE — Assessment & Plan Note (Signed)
 S/p facet injection as outlined. Pain is better.

## 2024-06-15 NOTE — Assessment & Plan Note (Signed)
 Has noticed odor change and recent symptoms as outlined. Urine dip - negative. Urine micro - negative rbc's and wbc's. She is feeling better currently. Eating. Discussed staying hydrated. Will hold on abx at this time. Await culture results. Follow.  Call with update.

## 2024-06-17 ENCOUNTER — Other Ambulatory Visit: Payer: Self-pay | Admitting: Internal Medicine

## 2024-06-17 LAB — URINE CULTURE
MICRO NUMBER:: 16657766
SPECIMEN QUALITY:: ADEQUATE

## 2024-06-17 MED ORDER — AMOXICILLIN-POT CLAVULANATE 875-125 MG PO TABS: 1.0000 | ORAL_TABLET | Freq: Two times a day (BID) | ORAL | 0 refills | Status: DC

## 2024-06-17 NOTE — Progress Notes (Signed)
 Rx sent in for augmentin  - for UTi. See result note.

## 2024-06-20 ENCOUNTER — Ambulatory Visit
Attending: Student in an Organized Health Care Education/Training Program | Admitting: Student in an Organized Health Care Education/Training Program

## 2024-06-20 ENCOUNTER — Encounter: Payer: Self-pay | Admitting: Student in an Organized Health Care Education/Training Program

## 2024-06-20 VITALS — BP 114/72 | HR 82 | Temp 97.1°F | Resp 16 | Ht 66.0 in | Wt 160.0 lb

## 2024-06-20 DIAGNOSIS — M47816 Spondylosis without myelopathy or radiculopathy, lumbar region: Secondary | ICD-10-CM | POA: Insufficient documentation

## 2024-06-20 DIAGNOSIS — M5136 Other intervertebral disc degeneration, lumbar region with discogenic back pain only: Secondary | ICD-10-CM | POA: Diagnosis present

## 2024-06-20 NOTE — Patient Instructions (Signed)

## 2024-06-20 NOTE — Progress Notes (Signed)
 PROVIDER NOTE: Interpretation of information contained herein should be left to medically-trained personnel. Specific patient instructions are provided elsewhere under Patient Instructions section of medical record. This document was created in part using AI and STT-dictation technology, any transcriptional errors that may result from this process are unintentional.  Patient: Barbara Mooney  Service: E/M Encounter  Provider: Wallie Sherry, MD  DOB: 05-28-1934  Delivery: Face-to-face  Specialty: Interventional Pain Management  MRN: [id number]  Setting: Ambulatory outpatient facility  Specialty designation: 09  Type: New Patient  Location: Outpatient office facility  PCP: Glendia Shad, MD  DOS: 06/20/2024    Referring Prov.: Glendia Shad, MD   Primary Reason(s) for Visit: Encounter for initial evaluation of one or more chronic problems (new to examiner) potentially causing chronic pain, and posing a threat to normal musculoskeletal function. (Level of risk: High) CC: Back Pain (lower)  HPI  [name] is a 89 y.o. year old, female patient, who comes for the first time to our practice referred by Glendia Shad, MD for our initial evaluation of her chronic pain. She has Multinodular goiter (nontoxic); Sepsis (HCC); Paroxysmal atrial fibrillation (HCC); Essential hypertension; Unsteady gait; Anemia; Encounter for anticoagulation discussion and counseling; Leg swelling; Type 2 diabetes mellitus with complication, with long-term current use of insulin  (HCC); Bradycardia; Ductal carcinoma in situ (DCIS) of right breast; Family history of breast cancer; Family history of ovarian cancer; Family history of colon cancer; Family history of Hodgkin's disease; Counseling and coordination of care; Acquired trigger finger; Closed Colles' fracture; Complete tear of right rotator cuff; External hemorrhoid; Follicular non-Hodgkin's lymphoma of bone (HCC); Gastroesophageal reflux disease without esophagitis;  Hyperlipemia, mixed; Knee strain, right, subsequent encounter; Malignant neoplasm of right tibia (HCC); Mixed incontinence; Onychomycosis of toenail; Osteopenia; Degenerative tear of lateral meniscus, left; Primary osteoarthritis of left knee; Primary osteoarthritis of right knee; Rotator cuff tendinitis, right; Tibial mass; Varicose veins of both legs with edema; Vitamin B 12 deficiency; Type 2 diabetes mellitus with stage 3 chronic kidney disease, without long-term current use of insulin  (HCC); Non-Hodgkin lymphoma (HCC); Endometrial cancer, grade I (HCC); Low back pain; Dizziness; Vitamin D  deficiency; Spinal stenosis; Abnormal urine odor; Intractable back pain; DNR (do not resuscitate); and Back pain on their problem list. Today she comes in for evaluation of her Back Pain (lower)  Pain Assessment: Location: Lower Back Radiating:   Onset:   Duration: Chronic pain Quality: Aching Severity: 0-No pain/10 (subjective, self-reported pain score)  Effect on ADL:   Timing: Intermittent Modifying factors:   BP: 114/72  HR: 82  Onset and Duration: Gradual and Present longer than 3 months Cause of pain: Unknown Severity: Getting better, NAS-11 at its worse: 10/10, NAS-11 at its best: 0/10, NAS-11 now: 1/10, and NAS-11 on the average: 2/10 Timing: Not influenced by the time of the day and After activity or exercise Aggravating Factors: Lifiting, Prolonged standing, Walking, Walking uphill, and Walking downhill Alleviating Factors: Denies alleviating factors  Associated Problems: Fatigue, Nausea, Sweating, and Weakness Quality of Pain: Aching Previous Examinations or Tests: CT scan, MRI scan, and Orthopedic evaluation Previous Treatments: Facet blocks, Morphine  pump, Narcotic medications, and Physical Therapy  Ms. Kotecki is being evaluated for possible interventional pain management therapies for the treatment of her chronic pain.  Discussed the use of AI scribe software for clinical note  transcription with the patient, who gave verbal consent to proceed.  History of Present Illness   Ladoris  B Gassner is an 89 year old female with lumbar degenerative changes who presents  for evaluation of low back pain. She was referred for evaluation of her low back pain after a hospital discharge.  Her low back pain began at the end of February and was initially thought to be related to her left knee. The pain started to radiate upwards, leading to an MRI in April. She received her first injection in mid-April, which was ineffective, followed by a second injection in mid-May, also without relief. The pain became so severe that she could not put her feet down without screaming and was unable to walk.  On June 21, she experienced a fall, resulting in a sprained right hand, which impeded her ability to get up. EMTs evaluated her and suggested further evaluation due to her ongoing issues. She was hospitalized for four days, where her pain was managed with oxycodone  and morphine , making it more tolerable. A facet injection was attempted but not completed during her hospital stay.  On June 27, she received a facet injection at [facility], which provided significant pain relief, and she reports being 'pain free now'. However, she remains weak and is undergoing physical therapy at home once a week. She also had a bacterial UTI, which is being treated with antibiotics, and she notes some improvement in energy levels since starting the medication.  Prior to the facet injection, her pain was alleviated by sitting and lying down. She was taking 1300 mg of Tylenol  Arthritis Strength six times a day, which provided no relief. She also used ointments and heating pads without success. The pain continued to worsen until the facet injection provided relief.  She reports weakness and fatigue, which have improved slightly with antibiotic treatment for a UTI. No current significant low back pain.       Meds   Current  Outpatient Medications:    apixaban  (ELIQUIS ) 5 MG TABS tablet, Take 5 mg by mouth once. Prescribed by Dr. Gollen, Disp: , Rfl:    acetaminophen  (TYLENOL ) 500 MG tablet, Take 1 tablet (500 mg total) by mouth 3 (three) times daily., Disp: 30 tablet, Rfl: 0   amoxicillin -clavulanate (AUGMENTIN ) 875-125 MG tablet, Take 1 tablet by mouth 2 (two) times daily., Disp: 14 tablet, Rfl: 0   bisacodyl  (DULCOLAX) 5 MG EC tablet, Take 1 tablet (5 mg total) by mouth daily as needed for moderate constipation., Disp: 30 tablet, Rfl: 0   Blood Glucose Monitoring Suppl DEVI, 1 each by Does not apply route daily. May substitute to any manufacturer covered by patient's insurance., Disp: 1 each, Rfl: 0   CRANBERRY PO, Take 1 capsule by mouth 2 (two) times daily. , Disp: , Rfl:    cyanocobalamin  (,VITAMIN B-12,) 1000 MCG/ML injection, Inject 1,000 mcg into the muscle every 30 (thirty) days. , Disp: , Rfl:    diltiazem  (CARDIZEM  CD) 180 MG 24 hr capsule, Take 1 capsule (180 mg total) by mouth 2 (two) times daily., Disp: 180 capsule, Rfl: 3   docusate sodium  (COLACE) 100 MG capsule, Take 1 capsule (100 mg total) by mouth 2 (two) times daily., Disp: 10 capsule, Rfl: 0   furosemide  (LASIX ) 20 MG tablet, Take 1 tablet (20 mg total) by mouth daily as needed for fluid or edema (SOB)., Disp: 90 tablet, Rfl: 3   Glucose Blood (BLOOD GLUCOSE TEST STRIPS) STRP, Use to check blood sugars once daily., Disp: 100 strip, Rfl: 12   LACTOBACILLUS PO, Take 1 tablet by mouth daily at 6 (six) AM., Disp: , Rfl:    Lancet Device MISC, Use to check blood sugars  once daily., Disp: 1 each, Rfl: 0   Lancets Misc. MISC, Use to check blood sugars once daily., Disp: 100 each, Rfl: 12   metFORMIN  (GLUCOPHAGE ) 500 MG tablet, Take 500 mg by mouth 2 (two) times daily with a meal., Disp: , Rfl:    olmesartan  (BENICAR ) 40 MG tablet, TAKE 1 TABLET BY MOUTH EVERY DAY, Disp: 90 tablet, Rfl: 3   omeprazole  (PRILOSEC) 20 MG capsule, TAKE 1 CAPSULE BY MOUTH  EVERY DAY, Disp: 90 capsule, Rfl: 1   ondansetron  (ZOFRAN ) 8 MG tablet, Take 1 tablet (8 mg total) by mouth 2 (two) times daily as needed for nausea or vomiting., Disp: 20 tablet, Rfl: 0   polyethylene glycol powder (GLYCOLAX /MIRALAX ) 17 GM/SCOOP powder, Take 17 g by mouth daily as needed for mild constipation., Disp: 238 g, Rfl: 0   Trolamine Salicylate (BLUE-EMU HEMP EX), Apply 1 application topically daily as needed (Knee pain)., Disp: , Rfl:    Vitamin D , Ergocalciferol , (DRISDOL ) 1.25 MG (50000 UNIT) CAPS capsule, Take 1 capsule (50,000 Units total) by mouth every 7 (seven) days., Disp: 12 capsule, Rfl: 0  Imaging Review    Narrative EXAM: CT CERVICAL SPINE WITHOUT CONTRAST 06/03/2024 12:50:40 PM  TECHNIQUE: CT of the cervical was performed without the administration of intravenous contrast. Multiplanar reformatted images are provided for review. Automated exposure control, iterative reconstruction, and/or weight based adjustment of the mA/kV was utilized to reduce the radiation dose to as low as reasonably achievable.  COMPARISON: None available.  CLINICAL HISTORY: Fall. Pt states she thinks she passed out. Pt has some back issues and had shots for that. Pt has been taking meds for leg weakness and back pain. Pt did hit her head and is on thinners.  FINDINGS:  CERVICAL SPINE:  BONES AND ALIGNMENT: Anterior osteophytes are fused C3-6. Large overhanging anterior osteophytes are present at C6-7. Straightening of the normal cervical lordosis is present. Ossification of the posterior spinal ligament is noted, likely related to remote trauma.  DEGENERATIVE CHANGES: Degenerative changes as described above.  SOFT TISSUES: No prevertebral soft tissue swelling.  VASCULATURE: Atherosclerotic calcifications are present in the proximal great vessels. No aneurysm or stenosis is evident.  LUNGS: Patchy ground-glass attenuation is present in both lungs.  IMPRESSION: 1. No  acute abnormality of the cervical spine related to the fall. 2. Straightening of the normal cervical lordosis. 3. Ossification of the posterior spinal ligament, likely related to remote trauma.  Electronically signed by: Lonni Necessary MD 06/03/2024 01:06 PM EDT RP Workstation: [id number]   MR SHOULDER RIGHT WO CONTRAST  Narrative CLINICAL DATA:  Right shoulder/upper arm pain for 2 months. Tear of right rotator cuff. Pt states she fell two months ago and landed on her right side. Pt has difficulty raising and lowering arm. Topical cream helps with the pain.  EXAM: MRI OF THE RIGHT SHOULDER WITHOUT CONTRAST  TECHNIQUE: Multiplanar, multisequence MR imaging of the shoulder was performed. No intravenous contrast was administered.  COMPARISON:  Report from 03/28/2017  FINDINGS: Despite efforts by the technologist and patient, motion artifact is present on today's exam and could not be eliminated. This reduces exam sensitivity and specificity.  Rotator cuff: Full-thickness partial width tear of the anterior supraspinatus shown on images 7 through 9 series 6, with adjacent moderate to prominent supraspinatus tendinopathy. The tear is about 8 mm from the distal insertion site.  Partial thickness articular surface tearing of the infraspinatus with mild infraspinatus tendinopathy.  Mild subscapularis tendinopathy.  Muscles: Subtle edema along the deep  surface of the infraspinatus adjacent to the scapula on image 14/3.  Biceps long head: Suspected longitudinal tearing of the intra-articular segment.  Acromioclavicular Joint: Moderate spurring. Small amount of fluid in the joint. Subcortical marrow edema. Type II acromion. Abnormal fluid in the subacromial subdeltoid bursa and subcoracoid bursa with mild synovitis.  Glenohumeral Joint: Moderate glenohumeral joint effusion. Moderate degenerative chondral thinning and chondral irregularity.  Labrum:  Grossly  unremarkable  Bones: Small degenerative subcortical cystic lesions along the base of the greater tuberosity. Small erosion or articular notching of the acromion on image 16/4.  Other: No supplemental non-categorized findings.  IMPRESSION: 1. Full-thickness partial width tear of the anterior supraspinatus tendon with moderate to prominent supraspinatus tendinopathy. 2. Partial thickness articular surface tear of the infraspinatus with mild infraspinatus tendinopathy. There is also mild subscapularis tendinopathy. 3. Longitudinal tearing of the intra-articular segment of the long head of biceps. 4. Moderate degenerative AC joint arthropathy and moderate degenerative glenohumeral arthropathy.   Electronically Signed By: Ryan Salvage M.D. On: 05/03/2017 07:52  Shoulder-L MR wo contrast: No results found for this or any previous visit.  S MR Lumbar Spine Wo Contrast  Narrative Clinical Data: 89 year old female with low back and bilateral leg pain with weakness.  MRI LUMBAR SPINE WITHOUT CONTRAST  Technique:  Multiplanar and multiecho pulse sequences of the lumbar spine were obtained without intravenous contrast.  Comparison: None.  Findings:  Visualized lower thoracic spinal cord is normal with conus medularis at L1. Visualized abdominal viscera and paraspinal soft tissues are within normal limits.  Grade 1 anterolisthesis of L4 on L5.  Minor right inferior L4 endplate marrow edema.  Otherwise preserved fibril body height and alignment.  T11-T12:  Negative.  T12-L1:  Negative.  L1-L2:  Negative.  L2-L3:  Mild to moderate facet hypertrophy.  Mild circumferential disc bulge.  No stenosis.  L3-L4:  Moderate facet hypertrophy.  Mild circumferential disc bulge.  No stenosis.  L4-L5:  Spondylolisthesis with moderate to large left eccentric pseudo disc protrusion.  Severe facet hypertrophy with fluid in both facet joints.  Severe ligament flavum hypertrophy.   Severe spinal and lateral recess stenosis.  Moderate to severe left L4 foraminal stenosis as well.  L5-S1:  Severe facet hypertrophy.  Fluid in the facet joints right greater than left.  Moderate to severe ligament flavum hypertrophy. Broad-based left eccentric disc protrusion superimposed on a circumferential bulge.  Moderate left lateral recess stenosis.  IMPRESSION: 1.  Multifactorial severe spinal and bilateral lateral recess stenosis at L4-L5.  Moderate to severe left L4 foraminal stenosis. Spondylolisthesis and severe facet arthropathy at this level. 2.  Multifactorial moderate left lateral recess stenosis at L5-S1.  Provider: Leeroy Pack   CT Lumbar Spine Wo Contrast  Narrative CLINICAL DATA:  Trauma, fall, acute on chronic lower back pain.  EXAM: CT LUMBAR SPINE WITHOUT CONTRAST  TECHNIQUE: Multidetector CT imaging of the lumbar spine was performed without intravenous contrast administration. Multiplanar CT image reconstructions were also generated.  RADIATION DOSE REDUCTION: This exam was performed according to the departmental dose-optimization program which includes automated exposure control, adjustment of the mA and/or kV according to patient size and/or use of iterative reconstruction technique.  COMPARISON:  MRI lumbar spine 05/09/2010.  FINDINGS: Segmentation: 5 lumbar type vertebrae.  Alignment: Lumbar lordosis is maintained. Similar grade 1 anterolisthesis of L4 on L5.  Vertebrae: No compression fracture or displaced fracture in the lumbar spine. No suspicious lytic or blastic osseous lesion. Degenerative endplate changes and endplate osteophytes most pronounced at  L1-2 and L4-5.  Paraspinal and other soft tissues: The paraspinal soft tissues are unremarkable. Extensive atherosclerosis of the abdominal aorta and branch vessels.  Disc levels: Mild intervertebral disc space narrowing at L1-2. Severe disc space narrowing at L4-5 with associated  endplate sclerosis and irregularity. Small disc bulges at multiple levels. Disc bulge, facet arthrosis, and thickening of the ligamentum flavum at L3-4 resulting in moderate spinal canal stenosis. Additional disc bulge at L4-5 along with moderate facet arthrosis and thickening of the ligamentum flavum resulting in at least moderate spinal canal stenosis likely with lateral recess narrowing. There is moderate bilateral foraminal stenosis at L4-5.  IMPRESSION: No acute fracture or traumatic malalignment of the lumbar spine.  Degenerative changes as above. Moderate spinal canal stenosis at L3-4 and L4-5. Foraminal stenosis greatest at L4-5.  Similar grade 1 anterolisthesis of L4 on L5.   Electronically Signed By: Donnice Mania M.D. On: 06/03/2024 13:55   Narrative *RADIOLOGY REPORT*  Clinical Data:  Lumbosacral spondylosis without myelopathy. Significant relief after the previous injection, without side effect or complication.  Partial recurrence of symptoms.  The patient wishes to repeat.  Procedure: The procedure, risks, benefits, and alternatives were explained to the patient. Questions regarding the procedure were encouraged and answered. The patient understands and consents to the procedure.  LUMBAR EPIDURAL INJECTION: An interlaminar approach was performed on the right at L4-5.  Operator donned sterile gloves and mask. The overlying skin was cleansed and anesthetized.  A 20 gauge Crawford epidural needle was advanced using loss-of-resistance technique.  DIAGNOSTIC EPIDURAL INJECTION: Injection of Omnipaque  180 shows a good epidural pattern with spread above and below the level of needle placement. No intrathecal or vascular opacification is seen.  THERAPEUTIC EPIDURAL INJECTION: 120mg  of Depo-Medrol  mixed with 5ml lidocaine  1% were instilled.  The procedure was well-tolerated, and the patient was discharged thirty minutes following the injection in good  condition.  Fluoroscopy Time: 18 seconds  Complications: none  Impression Technically successful epidural injection on the right at L4-5.  Original Report Authenticated By: CHARM TORIBIO JOHANN DOUGLAS, M.D.   MR KNEE RIGHT WO CONTRAST  Narrative CLINICAL DATA:  Chronic posterior right knee pain.  EXAM: MRI OF THE RIGHT KNEE WITHOUT CONTRAST  TECHNIQUE: Multiplanar, multisequence MR imaging of the knee was performed. No intravenous contrast was administered.  COMPARISON:  None.  FINDINGS: MENISCI  Medial meniscus: Severely degenerated and torn the posterior horn and midbody regions.  Lateral meniscus:  Intact  LIGAMENTS  Cruciates:  Intact  Collaterals:  Intact  CARTILAGE  Patellofemoral:  Mild to moderate degenerative chondrosis.  Medial: Advanced degenerative chondrosis with areas of full-thickness cartilage loss along with joint space narrowing and spurring.  Lateral:  Moderate degenerative chondrosis.  Joint: Small amount of joint fluid but no overt joint effusion. Mild synovitis. Superior and medial patellar plica are noted. No findings to suggest septic arthritis.  Popliteal Fossa:  No popliteal mass or Baker's cyst.  Extensor Mechanism: The patella retinacular structures are intact and the quadriceps and patellar tendons are intact.  Bones: Diffuse abnormal marrow signal in the tibia involving both the epiphysis and metaphysis. Somewhat marbled appearance. Findings suspicious for infection or infiltrating tumor such as lymphoma or metastatic disease. There are also subchondral fractures and mild fragmentation involving the medial tibial plateau which are likely chronic and could be pathologic.  The femur, fibula and patella are normal. A recent MRI of the shoulder from 05/02/2017 does not demonstrate any marrow abnormality.  Other: The knee musculature appears  normal.  IMPRESSION: 1. Abnormal marrow signal involving the tibia with probable  remote fractures involving the medial tibial plateau, possibly pathologic. Findings worrisome for infection or neoplasm, which is favored. Recommend correlation any known primary cancer. A whole-body bone scan may be helpful to assess for other areas of abnormality. Patient may require bone biopsy. 2. Degenerated and torn medial meniscus. 3. Intact ligamentous structures.  These results will be called to the ordering clinician or representative by the Radiologist Assistant, and communication documented in the PACS or zVision Dashboard.   Electronically Signed By: MYRTIS Stammer M.D. On: 10/06/2017 08:09   DG Wrist Complete Left  Narrative CLINICAL DATA:  Status post falling backwards. Swelling and pain of the left breast.  EXAM: LEFT WRIST - COMPLETE 3+ VIEW  COMPARISON:  None.  FINDINGS: There is a comminuted impacted distal radial fracture with an intra-articular component at the level of the scaphoid articulation. No significant displacement. Associated soft tissue swelling. The carpal rows are intact.  Vascular calcifications are noted.  IMPRESSION: Comminuted impacted intra-articular distal radial fracture.   Electronically Signed By: Dobrinka  Dimitrova M.D. On: 09/15/2018 09:21   Hand Imaging: Hand-R DG Complete: Results for orders placed during the hospital encounter of 06/03/24  DG Hand Complete Right  Narrative CLINICAL DATA:  Pain.  EXAM: RIGHT HAND - COMPLETE 3 VIEW  COMPARISON:  None Available.  FINDINGS: Distal interphalangeal degenerative changes with joint space narrowing and osteophytes. No acute fracture, dislocation or subluxation. No osteolytic or osteoblastic lesions.  IMPRESSION: Degenerative changes.   Electronically Signed By: Fonda Field M.D. On: 06/03/2024 13:24  Hand-L DG Complete: No results found for this or any previous visit.   Complexity Note: Imaging results reviewed.                         ROS   Cardiovascular: Abnormal Heart Rhythm (A-Fib), High Blood Pressure, and Blood Thinners (Eliquis ) anticoagulant Pulmonary or Respiratory: No reported pulmonary signs or symptoms such as wheezing and difficulty taking a deep full breath (Asthma), difficulty blowing air out (Emphysema), coughing up mucus (Bronchitis), persistent dry cough, or temporary stoppage of breathing during sleep Neurological: No reported neurological signs or symptoms such as seizures, abnormal skin sensations, urinary and/or fecal incontinence, being born with an abnormal open spine and/or a tethered spinal cord Psychological-Psychiatric: No reported psychological or psychiatric signs or symptoms such as difficulty sleeping, anxiety, depression, delusions or hallucinations (schizophrenial), mood swings (bipolar disorders) or suicidal ideations or attempts Gastrointestinal: Heartburn due to stomach pushing into lungs (Hiatal hernia) and Irregular, infrequent bowel movements (Constipation) Genitourinary: Recurrent Urinary Tract infections Hematological: Brusing easily Endocrine: High blood sugar controlled without the use of insulin  (NIDDM) Rheumatologic: No reported rheumatological signs and symptoms such as fatigue, joint pain, tenderness, swelling, redness, heat, stiffness, decreased range of motion, with or without associated rash Musculoskeletal: Negative for myasthenia gravis, muscular dystrophy, multiple sclerosis or malignant hyperthermia Work History: Retired  Allergies  Ms. Drinkard is allergic to nitrofurantoin  and sulfa antibiotics.  Laboratory Chemistry Profile   Renal Lab Results  Component Value Date   BUN 17 06/06/2024   CREATININE 0.75 06/06/2024   BCR 23 07/23/2020   GFR 48.74 (L) 04/19/2024   GFRAA 45 (L) 07/25/2020   GFRNONAA >60 06/06/2024   SPECGRAV 1.015 06/14/2024   PHUR 7.0 06/14/2024   PROTEINUR Negative 06/14/2024     Electrolytes Lab Results  Component Value Date   NA 136 06/06/2024    K 3.4 (  L) 06/06/2024   CL 103 06/06/2024   CALCIUM 9.1 06/06/2024     Hepatic Lab Results  Component Value Date   AST 15 06/03/2024   ALT 12 06/03/2024   ALBUMIN 3.2 (L) 06/03/2024   ALKPHOS 113 06/03/2024     ID Lab Results  Component Value Date   SARSCOV2NAA NEGATIVE 03/05/2021   MRSAPCR NEGATIVE 10/28/2016     Bone Lab Results  Component Value Date   VD25OH 13.19 (L) 04/19/2024     Endocrine Lab Results  Component Value Date   GLUCOSE 109 (H) 06/06/2024   GLUCOSEU NEGATIVE 06/03/2024   HGBA1C 6.8 (H) 04/19/2024   TSH 1.46 12/22/2023     Neuropathy Lab Results  Component Value Date   VITAMINB12 341 04/19/2024   HGBA1C 6.8 (H) 04/19/2024     CNS No results found for: COLORCSF, APPEARCSF, RBCCOUNTCSF, WBCCSF, POLYSCSF, LYMPHSCSF, EOSCSF, PROTEINCSF, GLUCCSF, JCVIRUS, CSFOLI, IGGCSF, LABACHR, ACETBL   Inflammation (CRP: Acute  ESR: Chronic) Lab Results  Component Value Date   LATICACIDVEN 1.6 10/28/2016     Rheumatology No results found for: RF, ANA, LABURIC, URICUR, LYMEIGGIGMAB, LYMEABIGMQN, HLAB27   Coagulation Lab Results  Component Value Date   INR 1.38 01/13/2017   LABPROT 17.1 (H) 01/13/2017   PLT 281 06/06/2024     Cardiovascular Lab Results  Component Value Date   TROPONINI 0.05 (HH) 10/28/2016   HGB 12.0 06/06/2024   HCT 36.5 06/06/2024     Screening Lab Results  Component Value Date   SARSCOV2NAA NEGATIVE 03/05/2021   MRSAPCR NEGATIVE 10/28/2016     Cancer No results found for: CEA, CA125, LABCA2   Allergens No results found for: ALMOND, APPLE, ASPARAGUS, AVOCADO, BANANA, BARLEY, BASIL, BAYLEAF, GREENBEAN, LIMABEAN, WHITEBEAN, BEEFIGE, REDBEET, BLUEBERRY, BROCCOLI, CABBAGE, MELON, CARROT, CASEIN, CASHEWNUT, CAULIFLOWER, CELERY     Note: Lab results reviewed.  PFSH  Drug: Ms. Romas  reports no history of drug use. Alcohol:  reports no  history of alcohol use. Tobacco:  reports that she has never smoked. She has never used smokeless tobacco. 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 Family: family history includes Arthritis in her father; Atrial fibrillation in her sister; Breast cancer (age of onset: 36) in her sister; Cancer in her brother; Colon cancer in her maternal aunt; Diabetes in her mother; Heart disease in her brother; Hypertension in her father; Ovarian cancer in her maternal aunt; Stroke in her sister.  Past Surgical History:  Procedure Laterality Date   ABDOMINAL HYSTERECTOMY  2024   BONE BIOPSY Right 2018   tibia   BREAST BIOPSY Right 02/26/2021   ffirm bx-X clip-HIGH-GRADE DUCTAL CARCINOMA IN SITU   BREAST LUMPECTOMY Right 03/09/2021   Procedure: BREAST LUMPECTOMY;  Surgeon: Dessa Reyes ORN, MD;  Location: [facility];  Service: General;  Laterality: Right;   CARDIOVERSION N/A 01/20/2017   Procedure: CARDIOVERSION;  Surgeon: Evalene JINNY Lunger, MD;  Location: [facility];  Service: Cardiovascular;  Laterality: N/A;  CATARACT EXTRACTION W/PHACO Right 02/15/2018   Procedure: CATARACT EXTRACTION PHACO AND INTRAOCULAR LENS PLACEMENT (IOC);  Surgeon: Jaye Fallow, MD;   Location: [facility];  Service: Ophthalmology;  Laterality: Right;  US  01:22.8 AP% 18.9 CDE 15.62 Fluid Pack Lot # [id number]   CATARACT EXTRACTION W/PHACO Left 03/08/2018   Procedure: CATARACT EXTRACTION PHACO AND INTRAOCULAR LENS PLACEMENT (IOC);  Surgeon: Jaye Fallow, MD;  Location: [facility];  Service: Ophthalmology;  Laterality: Left;  US  01:05 AP% 15.8 CDE 10.28 Fluid pak lot # [id number]   COLONOSCOPY     EYE SURGERY     Catara you   TOTAL LAPAROSCOPIC HYSTERECTOMY WITH BILATERAL SALPINGO OOPHORECTOMY N/A 07/27/2023   Procedure: TOTAL LAPAROSCOPIC HYSTERECTOMY WITH BILATERAL SALPINGO OOPHORECTOMY, SENTINEL LYMPH NODE INJECTION AND MAPPING, NODE DISSECTION;  Surgeon: Mancil Barter, MD;  Location: [facility];  Service: Gynecology;  Laterality: N/A;   Active Ambulatory Problems    Diagnosis Date Noted   Multinodular goiter (nontoxic) 09/07/2011   Sepsis (HCC) 10/28/2016   Paroxysmal atrial fibrillation (HCC)    Essential hypertension    Unsteady gait    Anemia    Encounter for anticoagulation discussion and counseling 02/08/2017   Leg swelling 02/08/2017   Type 2 diabetes mellitus with complication, with long-term current use of insulin  (HCC) 08/07/2017   Bradycardia 02/01/2019   Ductal carcinoma in situ (DCIS) of right breast 03/05/2021   Family history of breast cancer 07/21/2021   Family history of ovarian cancer 07/21/2021   Family history of colon cancer 07/21/2021   Family history of Hodgkin's disease 07/21/2021   Counseling and coordination of care 08/26/2021   Acquired trigger finger 06/11/2021   Closed Colles' fracture 09/23/2018   Complete tear of right rotator cuff 05/16/2017   External hemorrhoid 03/11/2015   Follicular non-Hodgkin's lymphoma of bone (HCC) 11/16/2017   Gastroesophageal reflux disease without esophagitis 03/11/2015   Hyperlipemia, mixed 07/21/2021   Knee strain, right, subsequent encounter 02/28/2015   Malignant neoplasm of right tibia (HCC)  10/07/2017   Mixed incontinence 06/11/2015   Onychomycosis of toenail 03/11/2015   Osteopenia 07/21/2021   Degenerative tear of lateral meniscus, left 08/09/2016   Primary osteoarthritis of left knee 08/09/2016   Primary osteoarthritis of right knee 02/28/2015   Rotator cuff tendinitis, right 05/16/2017   Tibial mass 10/13/2017   Varicose veins of both legs with edema 03/17/2016   Vitamin B 12 deficiency 06/13/2014   Type 2 diabetes mellitus with stage 3 chronic kidney disease, without long-term current use of insulin  (HCC) 01/27/2016   Non-Hodgkin lymphoma (HCC) 2018   Endometrial cancer, grade I (HCC) 07/27/2023   Low back pain 04/10/2024   Dizziness 04/24/2024   Vitamin D  deficiency 04/25/2024   Spinal stenosis 04/25/2024   Abnormal urine odor 05/27/2024   Intractable back pain 06/03/2024   DNR (do not resuscitate) 06/03/2024   Back pain 06/04/2024   Resolved Ambulatory Problems    Diagnosis Date Noted   No Resolved Ambulatory Problems   Past Medical History:  Diagnosis Date   Allergy    Aortic atherosclerosis (HCC)    Arthritis    Cataract 2018   Chronic kidney disease (CKD), stage III (moderate) (HCC)    DM (diabetes mellitus), type 2 (HCC)    Endometrial cancer (HCC) 05/25/2023   Follicular non-Hodgkin's lymphoma (HCC) 2018   GERD (gastroesophageal reflux disease)    History of hiatal hernia    HLD (hyperlipidemia)    HOH (hard of hearing)    Hypertension    Hypothyroidism  MRSA infection    Multiple thyroid  nodules    Peripheral edema    Permanent atrial fibrillation (HCC) 10/2016   RBBB (right bundle branch block)    Recurrent UTI (urinary tract infection)    Varicose veins of both lower extremities    Vitamin B12 deficiency    Constitutional Exam  General appearance: Well nourished, well developed, and well hydrated. In no apparent acute distress Vitals:   06/20/24 1324  BP: 114/72  Pulse: 82  Resp: 16  Temp: (!) 97.1 F (36.2 C)  TempSrc:  Temporal  SpO2: 98%  Weight: 160 lb (72.6 kg)  Height: 5' 6 (1.676 m)   BMI Assessment: Estimated body mass index is 25.82 kg/m as calculated from the following:   Height as of this encounter: 5' 6 (1.676 m).   Weight as of this encounter: 160 lb (72.6 kg).  BMI interpretation table: BMI level Category Range association with higher incidence of chronic pain  <18 kg/m2 Underweight   18.5-24.9 kg/m2 Ideal body weight   25-29.9 kg/m2 Overweight Increased incidence by 20%  30-34.9 kg/m2 Obese (Class I) Increased incidence by 68%  35-39.9 kg/m2 Severe obesity (Class II) Increased incidence by 136%  >40 kg/m2 Extreme obesity (Class III) Increased incidence by 254%   Patient's current BMI Ideal Body weight  Body mass index is 25.82 kg/m. Ideal body weight: 59.3 kg (130 lb 11.7 oz) Adjusted ideal body weight: 64.6 kg (142 lb 7 oz)   BMI Readings from Last 4 Encounters:  06/20/24 25.82 kg/m  06/14/24 25.82 kg/m  06/03/24 25.98 kg/m  04/24/24 27.04 kg/m   Wt Readings from Last 4 Encounters:  06/20/24 160 lb (72.6 kg)  06/14/24 160 lb (72.6 kg)  06/03/24 160 lb 15 oz (73 kg)  04/24/24 162 lb 8 oz (73.7 kg)    Psych/Mental status: Alert, oriented x 3 (person, place, & time)       Eyes: PERLA Respiratory: No evidence of acute respiratory distress  Lumbar Spine Area Exam  Skin & Axial Inspection: No masses, redness, or swelling Alignment: Symmetrical Functional ROM: Pain restricted ROM affecting primarily the right Stability: No instability detected Muscle Tone/Strength: Functionally intact. No obvious neuro-muscular anomalies detected. Sensory (Neurological): Musculoskeletal pain pattern Palpation: No palpable anomalies       Provocative Tests: Hyperextension/rotation test: (+) on the right for facet joint pain. Lumbar quadrant test (Kemp's test): (+) on the right for facet joint pain.  Gait & Posture Assessment  Ambulation: Limited Gait: Antalgic Posture: Difficulty  standing up straight, due to pain  Lower Extremity Exam    Side: Right lower extremity  Side: Left lower extremity  Stability: No instability observed          Stability: No instability observed          Skin & Extremity Inspection: Skin color, temperature, and hair growth are WNL. No peripheral edema or cyanosis. No masses, redness, swelling, asymmetry, or associated skin lesions. No contractures.  Skin & Extremity Inspection: Skin color, temperature, and hair growth are WNL. No peripheral edema or cyanosis. No masses, redness, swelling, asymmetry, or associated skin lesions. No contractures.  Functional ROM: Unrestricted ROM                  Functional ROM: Unrestricted ROM                  Muscle Tone/Strength: Functionally intact. No obvious neuro-muscular anomalies detected.  Muscle Tone/Strength: Functionally intact. No obvious neuro-muscular anomalies detected.  Sensory (  Neurological): Unimpaired        Sensory (Neurological): Unimpaired        DTR: Patellar: deferred today Achilles: deferred today Plantar: deferred today  DTR: Patellar: deferred today Achilles: deferred today Plantar: deferred today  Palpation: No palpable anomalies  Palpation: No palpable anomalies    Assessment  Primary Diagnosis & Pertinent Problem List: The primary encounter diagnosis was Lumbar facet arthropathy. Diagnoses of Lumbar spondylosis and Degeneration of intervertebral disc of lumbar region with discogenic back pain were also pertinent to this visit.  Visit Diagnosis (New problems to examiner): 1. Lumbar facet arthropathy   2. Lumbar spondylosis   3. Degeneration of intervertebral disc of lumbar region with discogenic back pain    Plan of Care (Initial workup plan)  Donzella  B Varano has a history of greater than 3 months of moderate to severe pain which is resulted in functional impairment.  The patient has tried various conservative therapeutic options such as NSAIDs, Tylenol , muscle relaxants,  physical therapy which was inadequately effective.  Patient's pain is predominantly axial with physical exam and L-Mri findings suggestive of facet arthropathy. Lumbar facet medial branch nerve blocks were discussed with the patient.  Risks and benefits were reviewed.  Discussed bilateral L3, L4, L5 medial branch nerve block, PRN order placed.  Patient will need to stop Eliquis  3 days prior if she comes in for her procedure.  She previously got good relief with her right L4-L5 facet joint injection done by interventional radiology on 06/08/2024.   Procedure Orders         LUMBAR FACET(MEDIAL BRANCH NERVE BLOCK) MBNB      Provider-requested follow-up: Return for patient will call to schedule F2F appt prn- PRN lumbar MBNB.  Future Appointments  Date Time Provider Department Center  07/09/2024  2:00 PM Glendia Shad, MD [facility]  08/06/2024 10:00 AM Lenn Aran, MD [facility] None  11/23/2024  9:30 AM [facility] ANNUAL WELLNESS VISIT [facility]  11/28/2024 10:00 AM [facility] None   I discussed the assessment and treatment plan with the patient. The patient was provided an opportunity to ask questions and all were answered. The patient agreed with the plan and demonstrated an understanding of the instructions.  Patient advised to call back or seek an in-person evaluation if the symptoms or condition worsens.  Duration of encounter: .  Total time on encounter, as per AMA guidelines included both the face-to-face and non-face-to-face time personally spent by the physician and/or other qualified health care professional(s) on the day of the encounter (includes time in activities that require the physician or other qualified health care professional and does not include time in activities normally performed by clinical staff). Physician's time may include the following activities when performed: Preparing to see the patient (e.g., pre-charting review of records,  searching for previously ordered imaging, lab work, and nerve conduction tests) Review of prior analgesic pharmacotherapies. Reviewing PMP Interpreting ordered tests (e.g., lab work, imaging, nerve conduction tests) Performing post-procedure evaluations, including interpretation of diagnostic procedures Obtaining and/or reviewing separately obtained history Performing a medically appropriate examination and/or evaluation Counseling and educating the patient/family/caregiver Ordering medications, tests, or procedures Referring and communicating with other health care professionals (when not separately reported) Documenting clinical information in the electronic or other health record Independently interpreting results (not separately reported) and communicating results to the patient/ family/caregiver Care coordination (not separately reported)  Note by: Wallie Sherry, MD (TTS and AI technology used. I apologize for any typographical errors that were  not detected and corrected.) Date: 06/20/2024; Time: 4:17 PM

## 2024-06-20 NOTE — Progress Notes (Signed)
 Safety precautions to be maintained throughout the outpatient stay will include: orient to surroundings, keep bed in low position, maintain call bell within reach at all times, provide assistance with transfer out of bed and ambulation.

## 2024-06-22 NOTE — Telephone Encounter (Signed)
 Please call [name] and thank her for the update.  Also, if she is not having any concerning symptoms at her next appt, would hold on doing a urine, but if there is any symptoms concerning for an infection, then we can check.  Glad she is doing better.

## 2024-06-22 NOTE — Telephone Encounter (Signed)
 Patient is aware

## 2024-06-26 ENCOUNTER — Ambulatory Visit: Admitting: Internal Medicine

## 2024-06-27 ENCOUNTER — Other Ambulatory Visit (HOSPITAL_COMMUNITY): Payer: Self-pay

## 2024-07-04 ENCOUNTER — Ambulatory Visit

## 2024-07-04 DIAGNOSIS — I129 Hypertensive chronic kidney disease with stage 1 through stage 4 chronic kidney disease, or unspecified chronic kidney disease: Secondary | ICD-10-CM | POA: Diagnosis not present

## 2024-07-04 DIAGNOSIS — D631 Anemia in chronic kidney disease: Secondary | ICD-10-CM

## 2024-07-04 DIAGNOSIS — E785 Hyperlipidemia, unspecified: Secondary | ICD-10-CM

## 2024-07-04 DIAGNOSIS — E042 Nontoxic multinodular goiter: Secondary | ICD-10-CM

## 2024-07-04 DIAGNOSIS — H9190 Unspecified hearing loss, unspecified ear: Secondary | ICD-10-CM

## 2024-07-04 DIAGNOSIS — I700 Atherosclerosis of aorta: Secondary | ICD-10-CM

## 2024-07-04 DIAGNOSIS — N390 Urinary tract infection, site not specified: Secondary | ICD-10-CM

## 2024-07-04 DIAGNOSIS — E039 Hypothyroidism, unspecified: Secondary | ICD-10-CM

## 2024-07-04 DIAGNOSIS — K219 Gastro-esophageal reflux disease without esophagitis: Secondary | ICD-10-CM

## 2024-07-04 DIAGNOSIS — E1122 Type 2 diabetes mellitus with diabetic chronic kidney disease: Secondary | ICD-10-CM | POA: Diagnosis not present

## 2024-07-04 DIAGNOSIS — M48061 Spinal stenosis, lumbar region without neurogenic claudication: Secondary | ICD-10-CM | POA: Diagnosis not present

## 2024-07-04 DIAGNOSIS — N1831 Chronic kidney disease, stage 3a: Secondary | ICD-10-CM | POA: Diagnosis not present

## 2024-07-09 ENCOUNTER — Ambulatory Visit: Admitting: Internal Medicine

## 2024-07-09 VITALS — BP 120/70 | HR 85 | Resp 16 | Ht 66.0 in | Wt 157.8 lb

## 2024-07-09 DIAGNOSIS — C8590 Non-Hodgkin lymphoma, unspecified, unspecified site: Secondary | ICD-10-CM

## 2024-07-09 DIAGNOSIS — Z7984 Long term (current) use of oral hypoglycemic drugs: Secondary | ICD-10-CM

## 2024-07-09 DIAGNOSIS — E538 Deficiency of other specified B group vitamins: Secondary | ICD-10-CM

## 2024-07-09 DIAGNOSIS — K219 Gastro-esophageal reflux disease without esophagitis: Secondary | ICD-10-CM

## 2024-07-09 DIAGNOSIS — E1122 Type 2 diabetes mellitus with diabetic chronic kidney disease: Secondary | ICD-10-CM

## 2024-07-09 DIAGNOSIS — N1831 Chronic kidney disease, stage 3a: Secondary | ICD-10-CM

## 2024-07-09 DIAGNOSIS — I480 Paroxysmal atrial fibrillation: Secondary | ICD-10-CM

## 2024-07-09 DIAGNOSIS — I10 Essential (primary) hypertension: Secondary | ICD-10-CM | POA: Diagnosis not present

## 2024-07-09 DIAGNOSIS — R0989 Other specified symptoms and signs involving the circulatory and respiratory systems: Secondary | ICD-10-CM

## 2024-07-09 DIAGNOSIS — E559 Vitamin D deficiency, unspecified: Secondary | ICD-10-CM

## 2024-07-09 DIAGNOSIS — E782 Mixed hyperlipidemia: Secondary | ICD-10-CM | POA: Diagnosis not present

## 2024-07-09 DIAGNOSIS — D0511 Intraductal carcinoma in situ of right breast: Secondary | ICD-10-CM

## 2024-07-09 MED ORDER — CYANOCOBALAMIN 1000 MCG/ML IJ SOLN
1000.0000 ug | Freq: Once | INTRAMUSCULAR | Status: AC
  Administered 2024-07-09: 1000 ug via INTRAMUSCULAR

## 2024-07-09 NOTE — Patient Instructions (Signed)
 Vitamin D3 2000 units per day

## 2024-07-09 NOTE — Progress Notes (Signed)
 Subjective:    Patient ID: Barbara Mooney  B Vandeberg, female    DOB: January 07, 1934, 89 y.o.   MRN: [id number]  Patient here for  Chief Complaint  Patient presents with   Medical Management of Chronic Issues    HPI Here for a scheduled follow up. Recently admitted 06/03/24 - 06/06/24. Presented after a fall. Admitted for management of intractable back pain with spinal stenosis most severe at L4-:L5. S/p facet injection - Friday 06/08/24. Helped her pain. Still with some weakness. PT. Has improved. Continues on olmesartan  and diltiazem  for her blood pressure. Blood pressures - 120-130s/70s. Breathing stable. Discussed bowel regimen - miralax , colace, dulcolax.    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 s/p XRT   GERD (gastroesophageal reflux disease)    History of hiatal hernia    HLD (hyperlipidemia)    HOH (hard of hearing)    Hypertension    Hypothyroidism    Mixed incontinence    MRSA infection    a.) remote history; posterior torso   Multiple thyroid  nodules    Peripheral edema    Permanent atrial fibrillation (HCC) 10/2016   a.) CHA2DS2VASc = 6 (age x2, sex, HTN, vascular disease history, T2DM);  b.) s/p DCCV 01/20/2017 (150 J x 1) --> failed; c.) rate/rhythm maintained on oral diltiazem ; chronically anticoagulated with apixaban    RBBB (right bundle branch  block)    Recurrent UTI (urinary tract infection)    Unsteady gait    Varicose veins of both lower extremities    Vitamin B12 deficiency    Past Surgical History:  Procedure Laterality Date   ABDOMINAL HYSTERECTOMY  2024   BONE BIOPSY Right 2018   tibia   BREAST BIOPSY Right 02/26/2021   ffirm bx-X clip-HIGH-GRADE DUCTAL CARCINOMA IN SITU   BREAST LUMPECTOMY Right 03/09/2021   Procedure: BREAST LUMPECTOMY;  Surgeon: Dessa Reyes ORN, MD;  Location: [facility];  Service: General;  Laterality: Right;   CARDIOVERSION N/A 01/20/2017   Procedure: CARDIOVERSION;  Surgeon: Evalene JINNY Lunger, MD;  Location: [facility];  Service: Cardiovascular;  Laterality: N/A;   CATARACT EXTRACTION W/PHACO Right 02/15/2018   Procedure: CATARACT EXTRACTION PHACO AND INTRAOCULAR LENS PLACEMENT (IOC);  Surgeon: Jaye Fallow, MD;  Location: [facility];  Service: Ophthalmology;  Laterality: Right;  US  01:22.8 AP% 18.9 CDE 15.62 Fluid Pack Lot # [id number]   CATARACT EXTRACTION W/PHACO Left 03/08/2018   Procedure: CATARACT EXTRACTION PHACO AND INTRAOCULAR LENS PLACEMENT (IOC);  Surgeon: Jaye Fallow, MD;  Location: [facility];  Service: Ophthalmology;  Laterality: Left;  US  01:05 AP% 15.8 CDE 10.28 Fluid pak lot # [id number]   COLONOSCOPY     EYE SURGERY     Catara you   TOTAL LAPAROSCOPIC HYSTERECTOMY WITH BILATERAL SALPINGO OOPHORECTOMY N/A 07/27/2023   Procedure: TOTAL LAPAROSCOPIC HYSTERECTOMY  WITH BILATERAL SALPINGO OOPHORECTOMY, SENTINEL LYMPH NODE INJECTION AND MAPPING, NODE DISSECTION;  Surgeon: Mancil Barter, MD;  Location: [facility];  Service: Gynecology;  Laterality: N/A;   Family History  Problem Relation Age of Onset   Diabetes Mother    Arthritis Father    Hypertension Father    Breast cancer Sister 72   Stroke Sister    Atrial fibrillation Sister    Cancer Brother    Heart disease Brother    Ovarian cancer Maternal Aunt    Colon cancer Maternal Aunt    Kidney cancer Neg Hx     Bladder Cancer Neg Hx    Social History   Socioeconomic History   Marital status: Widowed    Spouse name: Not on file   Number of children: Not on file   Years of education: Not on file   Highest education level: 12th grade  Occupational History   Not on file  Tobacco Use   Smoking status: Never   Smokeless tobacco: Never  Vaping Use   Vaping status: Never Used  Substance and Sexual Activity   Alcohol use: No   Drug use: No   Sexual activity: Not Currently  Other Topics Concern   Not on file  Social History Narrative   Lives by self; son lives about 2 miles. Never smoked; no alcohol. Used to [employer]: Low Risk  (06/14/2024)   Overall Financial Resource Strain (CARDIA)    Difficulty of Paying Living Expenses: Not hard at all  Food Insecurity: No Food Insecurity (06/14/2024)   Hunger Vital Sign    Worried About Running Out of Food in the Last Year: Never true    Ran Out of Food in the Last Year: Never true  Transportation Needs: No Transportation Needs (06/14/2024)   PRAPARE - Administrator, Civil Service (Medical): No    Lack of Transportation (Non-Medical): No  Physical Activity: Inactive (06/14/2024)   Exercise Vital Sign    Days of Exercise per Week: 0 days    Minutes of Exercise per Session: Not on file  Stress: No Stress Concern Present (06/14/2024)   [employer] of Occupational Health - Occupational Stress Questionnaire    Feeling of Stress: Only a little  Social Connections: Moderately Integrated (06/14/2024)   Social Connection and Isolation Panel    Frequency of Communication with Friends and Family: Three times a week    Frequency of Social Gatherings with Friends and Family: Once a week    Attends Religious Services: More than 4 times per year    Active Member of [employer] or Organizations: Yes    Attends Banker Meetings: More than 4 times per year    Marital Status: Widowed      Review of Systems  Constitutional:  Negative for appetite change and fever.  HENT:  Negative for congestion and sinus pressure.   Respiratory:  Negative for cough and chest tightness.        Breathing stable.   Cardiovascular:  Negative for chest pain and palpitations.  Gastrointestinal:  Negative for abdominal pain, diarrhea, nausea and vomiting.  Genitourinary:  Negative for difficulty urinating and dysuria.  Musculoskeletal:  Negative for joint swelling and myalgias.  Skin:  Negative for color change and rash.  Neurological:  Negative for dizziness and headaches.  Psychiatric/Behavioral:  Negative for agitation and dysphoric mood.        Objective:     BP  120/70   Pulse 85   Resp 16   Ht 5' 6 (1.676 m)   Wt 157 lb 12.8 oz (71.6 kg)   SpO2 98%   BMI 25.47 kg/m  Wt Readings from Last 3 Encounters:  07/09/24 157 lb 12.8 oz (71.6 kg)  06/20/24 160 lb (72.6 kg)  06/14/24 160 lb (72.6 kg)    Physical Exam Vitals reviewed.  Constitutional:      General: She is not in acute distress.    Appearance: Normal appearance.  HENT:     Head: Normocephalic and atraumatic.     Right Ear: External ear normal.     Left Ear: External ear normal.     Mouth/Throat:     Pharynx: No oropharyngeal exudate or posterior oropharyngeal erythema.  Eyes:     General: No scleral icterus.       Right eye: No discharge.        Left eye: No discharge.     Conjunctiva/sclera: Conjunctivae normal.  Neck:     Thyroid : No thyromegaly.  Cardiovascular:     Rate and Rhythm: Normal rate and regular rhythm.  Pulmonary:     Effort: No respiratory distress.     Breath sounds: Normal breath sounds. No wheezing.  Abdominal:     General: Bowel sounds are normal.     Palpations: Abdomen is soft.     Tenderness: There is no abdominal tenderness.  Musculoskeletal:        General: No tenderness.     Cervical back: Neck supple. No tenderness.     Comments: Unable to appreciate DP pulse.    Lymphadenopathy:     Cervical: No cervical adenopathy.  Skin:    Findings: No erythema or rash.  Neurological:     Mental Status: She is alert.  Psychiatric:        Mood and Affect: Mood normal.        Behavior: Behavior normal.         Outpatient Encounter Medications as of 07/09/2024  Medication Sig   acetaminophen  (TYLENOL ) 500 MG tablet Take 1 tablet (500 mg total) by mouth 3 (three) times daily.   apixaban  (ELIQUIS ) 5 MG TABS tablet Take 5 mg by mouth once. Prescribed by Dr. Gollen   bisacodyl  (DULCOLAX) 5 MG EC tablet Take 1 tablet (5 mg total) by mouth daily as needed for moderate constipation.   Blood Glucose Monitoring Suppl DEVI 1 each by Does not apply route daily. May substitute to any manufacturer covered by patient's insurance.   CRANBERRY PO Take 1 capsule by mouth 2 (two) times daily.    cyanocobalamin  (,VITAMIN B-12,) 1000 MCG/ML injection Inject 1,000 mcg into the muscle every 30 (thirty) days.    diltiazem  (CARDIZEM  CD) 180 MG 24 hr capsule Take 1 capsule (180 mg total) by mouth 2 (two) times daily.   docusate sodium  (COLACE) 100 MG capsule Take 1 capsule (100 mg total) by mouth 2 (two) times daily.   furosemide  (LASIX ) 20 MG tablet Take 1 tablet (20 mg total) by mouth daily as needed for fluid or edema (SOB).   Glucose Blood (BLOOD GLUCOSE TEST STRIPS) STRP Use to check blood sugars once daily.   LACTOBACILLUS PO Take 1 tablet by mouth daily at 6 (six) AM.   Lancet Device MISC Use to check blood sugars once daily.   Lancets Misc. MISC Use to check blood sugars once daily.   metFORMIN  (GLUCOPHAGE ) 500 MG tablet Take 500 mg by mouth 2 (two) times  daily with a meal.   olmesartan  (BENICAR ) 40 MG tablet TAKE 1 TABLET BY MOUTH EVERY DAY   omeprazole  (PRILOSEC) 20 MG capsule TAKE 1 CAPSULE BY MOUTH EVERY DAY   ondansetron  (ZOFRAN ) 8 MG tablet Take 1 tablet (8 mg total) by mouth 2 (two) times daily as needed for nausea or vomiting.   polyethylene glycol powder  (GLYCOLAX /MIRALAX ) 17 GM/SCOOP powder Take 17 g by mouth daily as needed for mild constipation.   Trolamine Salicylate (BLUE-EMU HEMP EX) Apply 1 application topically daily as needed (Knee pain).   [DISCONTINUED] amoxicillin -clavulanate (AUGMENTIN ) 875-125 MG tablet Take 1 tablet by mouth 2 (two) times daily.   [DISCONTINUED] Vitamin D , Ergocalciferol , (DRISDOL ) 1.25 MG (50000 UNIT) CAPS capsule Take 1 capsule (50,000 Units total) by mouth every 7 (seven) days.   [EXPIRED] cyanocobalamin  (VITAMIN B12) injection 1,000 mcg    No facility-administered encounter medications on file as of 07/09/2024.     Lab Results  Component Value Date   WBC 5.1 07/09/2024   HGB 11.9 (L) 07/09/2024   HCT 36.2 07/09/2024   PLT 294.0 07/09/2024   GLUCOSE 118 (H) 07/09/2024   CHOL 212 (H) 04/19/2024   TRIG 84.0 04/19/2024   HDL 59.10 04/19/2024   LDLCALC 137 (H) 04/19/2024   ALT 12 06/03/2024   AST 15 06/03/2024   NA 139 07/09/2024   K 3.4 (L) 07/09/2024   CL 99 07/09/2024   CREATININE 1.19 07/09/2024   BUN 16 07/09/2024   CO2 29 07/09/2024   TSH 1.46 12/22/2023   INR 1.38 01/13/2017   HGBA1C 6.8 (H) 04/19/2024    DG FACET JT INJ L /S SINGLE LEVEL RIGHT W/FL/CT Result Date: 06/08/2024 CLINICAL DATA:  89 year old woman with history of bandlike lower back pain, greater on the right. She has some associated pain in hips and lower extremities, but the back pain bothers her the most. Outside lumbar spine MRI demonstrates right-greater-than-left facet arthropathy at L4-5 with periarticular edema on the right. Right L4-5 facet joint injection was requested. EXAM: RIGHT L4-5 FACET INJECTION UNDER FLUOROSCOPY TECHNIQUE: Overlying skin prepped with Betadine , draped in the usual sterile fashion, and infiltrated locally with buffered Lidocaine . Curved 22 gauge spinal needle advanced to the posterior aspect of the right L4-5 facet. Small injection of 0.2 mL Isovue -M 200 demonstrates intra-articular spread. 80 mg  Depo-Medrol  and 0.5 mL Sensorcaine  0.5% was then administered. No immediate complication. Patient reported excellent concordance with her typical lower back pain during injection and significant improvement in pain following the injection. FLUOROSCOPY: Radiation Exposure Index (as provided by the fluoroscopic device): 2.30 mGy Kerma. Fluoroscopy time: 24 seconds. IMPRESSION: Technically successful right L4-5 facet injection under fluoroscopy. Electronically Signed   By: Ryan Chess M.D.   On: 06/08/2024 15:48       Assessment & Plan:  Essential hypertension Assessment & Plan: Continues on olmesartan  and diltiazem . Blood pressure as outlined. No changes today.   Orders: -     Basic metabolic panel with GFR  Hyperlipemia, mixed Assessment & Plan: Low cholesterol diet and exercise as tolerated. Per note, has desired not to start cholesterol medication. Follow lipid panel.  Lab Results  Component Value Date   CHOL 212 (H) 04/19/2024   HDL 59.10 04/19/2024   LDLCALC 137 (H) 04/19/2024   TRIG 84.0 04/19/2024   CHOLHDL 4 04/19/2024      Non-Hodgkin's lymphoma, unspecified body region, unspecified non-Hodgkin lymphoma type [facility]) Assessment & Plan: Treated at [facility].   Orders: -     CBC  with Differential/Platelet  Vitamin B 12 deficiency -     Cyanocobalamin   Ductal carcinoma in situ (DCIS) of right breast Assessment & Plan: S/p lumpectomy. Followed by Dr Camelia. Saw Dr Cesar as outlined. Mammogram 03/02/24 - birads II.  Plan to get follow up mammograms through PCP.    Gastroesophageal reflux disease without esophagitis Assessment & Plan: Upper symptoms appear to be controlled. Continue prilosec.    Vitamin D  deficiency Assessment & Plan: Vitamin D3 2000 units q day. Follow.    Type 2 diabetes mellitus with stage 3a chronic kidney disease, without long-term current use of insulin  (HCC) Assessment & Plan: Follow met b and A1c. Continues on metformin .    Paroxysmal  atrial fibrillation (HCC) Assessment & Plan: Rate.  On eliquis . Continues in diltiazem . EKG afib with RBBB. Stable.    Absent foot pulse Assessment & Plan: Refer to [facility] for further evaluation.   Orders: -     Ambulatory referral to Vascular Surgery     Allena Hamilton, MD

## 2024-07-09 NOTE — Assessment & Plan Note (Addendum)
 Treated at [facility]

## 2024-07-10 LAB — BASIC METABOLIC PANEL WITH GFR
BUN: 16 mg/dL (ref 6–23)
CO2: 29 meq/L (ref 19–32)
Calcium: 9.1 mg/dL (ref 8.4–10.5)
Chloride: 99 meq/L (ref 96–112)
Creatinine, Ser: 1.19 mg/dL (ref 0.40–1.20)
GFR: 40.92 mL/min — ABNORMAL LOW (ref >60.00)
Glucose, Bld: 118 mg/dL — ABNORMAL HIGH (ref 70–99)
Potassium: 3.4 meq/L — ABNORMAL LOW (ref 3.5–5.1)
Sodium: 139 meq/L (ref 135–145)

## 2024-07-10 LAB — CBC WITH DIFFERENTIAL/PLATELET
Basophils Absolute: 0 K/uL (ref 0.0–0.1)
Basophils Relative: 0.7 % (ref 0.0–3.0)
Eosinophils Absolute: 0.1 K/uL (ref 0.0–0.7)
Eosinophils Relative: 2.7 % (ref 0.0–5.0)
HCT: 36.2 % (ref 36.0–46.0)
Hemoglobin: 11.9 g/dL — ABNORMAL LOW (ref 12.0–15.0)
Lymphocytes Relative: 8 % — ABNORMAL LOW (ref 12.0–46.0)
Lymphs Abs: 0.4 K/uL — ABNORMAL LOW (ref 0.7–4.0)
MCHC: 32.9 g/dL (ref 30.0–36.0)
MCV: 92 fl (ref 78.0–100.0)
Monocytes Absolute: 0.5 K/uL (ref 0.1–1.0)
Monocytes Relative: 9.5 % (ref 3.0–12.0)
Neutro Abs: 4 K/uL (ref 1.4–7.7)
Neutrophils Relative %: 79.1 % — ABNORMAL HIGH (ref 43.0–77.0)
Platelets: 294 K/uL (ref 150.0–400.0)
RBC: 3.93 Mil/uL (ref 3.87–5.11)
RDW: 15.5 % (ref 11.5–15.5)
WBC: 5.1 K/uL (ref 4.0–10.5)

## 2024-07-11 ENCOUNTER — Ambulatory Visit: Payer: Self-pay | Admitting: Internal Medicine

## 2024-07-11 ENCOUNTER — Other Ambulatory Visit: Payer: Self-pay | Admitting: Internal Medicine

## 2024-07-11 DIAGNOSIS — R944 Abnormal results of kidney function studies: Secondary | ICD-10-CM

## 2024-07-11 NOTE — Progress Notes (Signed)
Order placed for f/u met b.

## 2024-07-13 ENCOUNTER — Telehealth: Payer: Self-pay

## 2024-07-13 DIAGNOSIS — R2681 Unsteadiness on feet: Secondary | ICD-10-CM

## 2024-07-13 NOTE — Telephone Encounter (Signed)
 Copied from CRM #[id number]. Topic: Referral - Question >> Jul 13, 2024  4:43 PM Jayma L wrote: Reason for CRM: laura with home care called asking for a update on the referral for the bed side toilet and the cane needed for patient. Please fax to [phone]

## 2024-07-14 ENCOUNTER — Encounter: Payer: Self-pay | Admitting: Internal Medicine

## 2024-07-14 DIAGNOSIS — R0989 Other specified symptoms and signs involving the circulatory and respiratory systems: Secondary | ICD-10-CM | POA: Insufficient documentation

## 2024-07-14 NOTE — Assessment & Plan Note (Signed)
Upper symptoms appear to be controlled.  Continue prilosec.

## 2024-07-14 NOTE — Assessment & Plan Note (Signed)
 Follow met b and A1c. Continues on metformin .

## 2024-07-14 NOTE — Assessment & Plan Note (Signed)
 S/p lumpectomy. Followed by Dr Camelia. Saw Dr Cesar as outlined. Mammogram 03/02/24 - birads II.  Plan to get follow up mammograms through PCP.

## 2024-07-14 NOTE — Assessment & Plan Note (Signed)
 Rate.  On eliquis . Continues in diltiazem . EKG afib with RBBB. Stable.

## 2024-07-14 NOTE — Assessment & Plan Note (Signed)
 Continues on olmesartan  and diltiazem . Blood pressure as outlined. No changes today.

## 2024-07-14 NOTE — Assessment & Plan Note (Signed)
 Refer to [facility] for further evaluation.

## 2024-07-14 NOTE — Assessment & Plan Note (Signed)
 Low cholesterol diet and exercise as tolerated. Per note, has desired not to start cholesterol medication. Follow lipid panel.   Lab Results  Component Value Date   CHOL 212 (H) 04/19/2024   HDL 59.10 04/19/2024   LDLCALC 137 (H) 04/19/2024   TRIG 84.0 04/19/2024   CHOLHDL 4 04/19/2024

## 2024-07-14 NOTE — Assessment & Plan Note (Signed)
 Vitamin D3 2000 units q day. Follow.

## 2024-07-15 ENCOUNTER — Other Ambulatory Visit: Payer: Self-pay | Admitting: Internal Medicine

## 2024-07-16 NOTE — Addendum Note (Signed)
 Addended by: BRIEN SHARENE RAMAN on: 07/16/2024 03:44 PM   Modules accepted: Orders

## 2024-07-16 NOTE — Telephone Encounter (Signed)
 DME's printed and placed in quick sign folder for providers signature

## 2024-07-17 NOTE — Telephone Encounter (Signed)
 Signed and placed in box.

## 2024-07-17 NOTE — Telephone Encounter (Signed)
Form has been faxed over.

## 2024-07-19 ENCOUNTER — Other Ambulatory Visit (INDEPENDENT_AMBULATORY_CARE_PROVIDER_SITE_OTHER)

## 2024-07-19 DIAGNOSIS — R944 Abnormal results of kidney function studies: Secondary | ICD-10-CM

## 2024-07-20 LAB — BASIC METABOLIC PANEL WITH GFR
BUN: 18 mg/dL (ref 6–23)
CO2: 31 meq/L (ref 19–32)
Calcium: 9 mg/dL (ref 8.4–10.5)
Chloride: 101 meq/L (ref 96–112)
Creatinine, Ser: 0.95 mg/dL (ref 0.40–1.20)
GFR: 53.61 mL/min — ABNORMAL LOW (ref >60.00)
Glucose, Bld: 141 mg/dL — ABNORMAL HIGH (ref 70–99)
Potassium: 4 meq/L (ref 3.5–5.1)
Sodium: 143 meq/L (ref 135–145)

## 2024-07-21 ENCOUNTER — Ambulatory Visit: Payer: Self-pay | Admitting: Internal Medicine

## 2024-07-25 ENCOUNTER — Encounter: Payer: Self-pay | Admitting: Internal Medicine

## 2024-07-25 NOTE — Telephone Encounter (Signed)
 Since I am not in the office this pm and will not be in the office the rest of the week, if lethargic, etc - she needs to be seen. Need to confirm diagnosis.

## 2024-07-25 NOTE — Telephone Encounter (Signed)
 Last office vist was 07/09/24. Would you like for pt to come in and be seen

## 2024-07-30 ENCOUNTER — Encounter: Payer: Self-pay | Admitting: Cardiovascular Disease

## 2024-08-04 ENCOUNTER — Other Ambulatory Visit: Payer: Self-pay | Admitting: Internal Medicine

## 2024-08-06 ENCOUNTER — Ambulatory Visit: Admitting: Radiation Oncology

## 2024-08-07 NOTE — Telephone Encounter (Signed)
 Please refuse. She is on OTC vit D

## 2024-08-09 ENCOUNTER — Ambulatory Visit

## 2024-08-09 ENCOUNTER — Ambulatory Visit: Admitting: Radiation Oncology

## 2024-08-11 ENCOUNTER — Other Ambulatory Visit: Payer: Self-pay | Admitting: Internal Medicine

## 2024-08-11 DIAGNOSIS — E1122 Type 2 diabetes mellitus with diabetic chronic kidney disease: Secondary | ICD-10-CM

## 2024-08-14 ENCOUNTER — Ambulatory Visit: Admitting: Radiation Oncology

## 2024-08-14 ENCOUNTER — Ambulatory Visit

## 2024-08-15 ENCOUNTER — Other Ambulatory Visit: Payer: Self-pay

## 2024-08-15 DIAGNOSIS — C8590 Non-Hodgkin lymphoma, unspecified, unspecified site: Secondary | ICD-10-CM

## 2024-08-15 DIAGNOSIS — M1711 Unilateral primary osteoarthritis, right knee: Secondary | ICD-10-CM

## 2024-08-15 DIAGNOSIS — M549 Dorsalgia, unspecified: Secondary | ICD-10-CM

## 2024-08-15 DIAGNOSIS — M48061 Spinal stenosis, lumbar region without neurogenic claudication: Secondary | ICD-10-CM

## 2024-08-15 DIAGNOSIS — R2681 Unsteadiness on feet: Secondary | ICD-10-CM

## 2024-08-15 DIAGNOSIS — M1712 Unilateral primary osteoarthritis, left knee: Secondary | ICD-10-CM

## 2024-08-15 NOTE — Progress Notes (Signed)
 DME for bedside commode printed and given to daughter to take to local medical supply store.

## 2024-08-20 ENCOUNTER — Telehealth: Payer: Self-pay | Admitting: Cardiovascular Disease

## 2024-08-20 ENCOUNTER — Encounter: Payer: Self-pay | Admitting: Internal Medicine

## 2024-08-20 NOTE — Telephone Encounter (Signed)
 Returned daughter's call.  Concerned that her pt is more lethargic and has less energy and has increased swelling in her feet and ankles; daughter states that pt does elevate her feet and takes her Lasix  20 mg for 3 days per a conversation with Dr. Gollan several months ago; they have not been keeping a log of her daily weights and I encouraged them to start keeping a log and gave her parameters to know if pt was having increased fluid by gaining 3 pounds over night or 5 pounds for the week; daughter thanked me for the guidelines and verbalized she would start keeping a daily weight log; daughter states that pt has completed her radiation treatments and has had procedures on her knees and back and has been working with PT; daughter states that she doesn't have increased SOB but does tire quickly; denies CP; requested to move pt's 11/17 appointment up to 11/7 to limit number of trips and was able to accommodate her request.  She would like to be placed on a cancellation list if possible. I will send scheduling a message; encouraged daughter to call back if she has any further concerns, and she verbalizes that she knows to call 911 if her Mom's condition deteriorates.  She thanked me for taking time out to call her back.

## 2024-08-20 NOTE — Telephone Encounter (Signed)
 Pt c/o swelling: STAT is pt has developed SOB within 24 hours  How much weight have you gained and in what time span?  No weight gain, per daughter. Saw GP last week. Has actually lost a few lbs.  If swelling, where is the swelling located?  Feet and ankles.  Are you currently taking a fluid pill?  Lasix  as needed, per daughter  Are you currently SOB?   Do you have a log of your daily weights (if so, list)?  No log available. Was 156-158 lbs last week. Normally around 160-162 lbs.  Have you gained 3 pounds in a day or 5 pounds in a week?   Have you traveled recently?  No, but daughter mentions patient has been more lethargic/fatigued.

## 2024-08-21 ENCOUNTER — Ambulatory Visit
Attending: Student in an Organized Health Care Education/Training Program | Admitting: Student in an Organized Health Care Education/Training Program

## 2024-08-21 ENCOUNTER — Encounter: Payer: Self-pay | Admitting: Student in an Organized Health Care Education/Training Program

## 2024-08-21 ENCOUNTER — Other Ambulatory Visit: Payer: Self-pay

## 2024-08-21 ENCOUNTER — Encounter: Payer: Self-pay | Admitting: Internal Medicine

## 2024-08-21 ENCOUNTER — Ambulatory Visit (INDEPENDENT_AMBULATORY_CARE_PROVIDER_SITE_OTHER)

## 2024-08-21 VITALS — BP 152/82 | HR 87 | Temp 97.1°F | Resp 16 | Ht 66.0 in | Wt 156.0 lb

## 2024-08-21 DIAGNOSIS — Z23 Encounter for immunization: Secondary | ICD-10-CM

## 2024-08-21 DIAGNOSIS — E538 Deficiency of other specified B group vitamins: Secondary | ICD-10-CM

## 2024-08-21 DIAGNOSIS — M25562 Pain in left knee: Secondary | ICD-10-CM | POA: Insufficient documentation

## 2024-08-21 DIAGNOSIS — M1712 Unilateral primary osteoarthritis, left knee: Secondary | ICD-10-CM | POA: Insufficient documentation

## 2024-08-21 DIAGNOSIS — G8929 Other chronic pain: Secondary | ICD-10-CM | POA: Insufficient documentation

## 2024-08-21 MED ORDER — CYANOCOBALAMIN 1000 MCG/ML IJ SOLN
1000.0000 ug | Freq: Once | INTRAMUSCULAR | Status: AC
  Administered 2024-08-21: 1000 ug via INTRAMUSCULAR

## 2024-08-21 MED ORDER — METFORMIN HCL 500 MG PO TABS: 500.0000 mg | ORAL_TABLET | Freq: Two times a day (BID) | ORAL | 1 refills | Status: AC

## 2024-08-21 NOTE — Telephone Encounter (Signed)
 Discussed with Sueanne. Metformin  rx sent in to pharmacy.

## 2024-08-21 NOTE — Progress Notes (Signed)
 Safety precautions to be maintained throughout the outpatient stay will include: orient to surroundings, keep bed in low position, maintain call bell within reach at all times, provide assistance with transfer out of bed and ambulation.

## 2024-08-21 NOTE — Telephone Encounter (Signed)
 Flu shot to be given today at appt for b12 and metformin  has been sent to pharmacy. See other message request.

## 2024-08-21 NOTE — Progress Notes (Cosign Needed Addendum)
 Pt received B12 injection in Left  deltoid muscle. Pt tolerated it well with no complaints or concerns.   Pt received High dose Influenza injection in  Left  deltoid muscle. Pt tolerated it well with no complaints or concerns.

## 2024-08-21 NOTE — Telephone Encounter (Signed)
 Pt requesting refill on Metformin . Per review states historical

## 2024-08-21 NOTE — Progress Notes (Signed)
 PROVIDER NOTE: Interpretation of information contained herein should be left to medically-trained personnel. Specific patient instructions are provided elsewhere under Patient Instructions section of medical record. This document was created in part using AI and STT-dictation technology, any transcriptional errors that may result from this process are unintentional.  Patient: Barbara Mooney  Service: E/M   PCP: Glendia Shad, MD  DOB: 1934-09-22  DOS: 08/21/2024  Provider: Wallie Sherry, MD  MRN: [id number]  Delivery: Face-to-face  Specialty: Interventional Pain Management  Type: Established Patient  Setting: Ambulatory outpatient facility  Specialty designation: 09  Referring Prov.: Glendia Shad, MD  Location: Outpatient office facility       History of present illness (HPI) Barbara Mooney, a 89 y.o. year old female, is here today because of her Primary osteoarthritis of left knee [M17.12]. Ms. Delamar primary complain today is Knee Pain (left)  Pertinent problems: left knee pain  Pain Assessment: Severity of Chronic pain is reported as a 5 /10. Location: Knee Left/denies. Onset:  . Quality: Aching. Timing:  . Modifying factor(s): sitting. Vitals:  height is 5' 6 (1.676 m) and weight is 156 lb (70.8 kg). Her temperature is 97.1 F (36.2 C) (abnormal). Her blood pressure is 152/82 (abnormal) and her pulse is 87. Her respiration is 16 and oxygen saturation is 100%.  BMI: Estimated body mass index is 25.18 kg/m as calculated from the following:   Height as of this encounter: 5' 6 (1.676 m).   Weight as of this encounter: 156 lb (70.8 kg).  Last encounter: 06/20/2024. Last procedure: Visit date not found.  Reason for encounter:   Discussed the use of AI scribe software for clinical note transcription with the patient, who gave verbal consent to proceed.  History of Present Illness   Barbara  B Mooney is an 89 year old female who presents with left knee pain.  She has been  experiencing intermittent left knee pain for a long time, with relief only achieved through injections. The relief from these injections isn't long term but they do help. Her last knee injection was in March 2025.  For her knee pain, she uses topical treatments like Emu oil and takes Tylenol  for pain relief. She has been experiencing knee pain consistently for the past six to eight weeks.      ROS  Constitutional: Denies any fever or chills Gastrointestinal: No reported hemesis, hematochezia, vomiting, or acute GI distress Musculoskeletal: left knee pain Neurological: No reported episodes of acute onset apraxia, aphasia, dysarthria, agnosia, amnesia, paralysis, loss of coordination, or loss of consciousness  Medication Review  Acetaminophen  Extra Strength, BLOOD GLUCOSE TEST STRIPS, Cranberry, Lactobacillus, Lancet Device, Lancets Misc., ONE TOUCH ULTRA 2, Trolamine Salicylate, apixaban , bisacodyl , cyanocobalamin , diltiazem , docusate sodium , furosemide , metFORMIN , olmesartan , omeprazole , ondansetron , and polyethylene glycol powder  History Review  Allergy: Barbara Mooney is allergic to nitrofurantoin  and sulfa antibiotics. Drug: Barbara Mooney  reports no history of drug use. Alcohol:  reports no history of alcohol use. Tobacco:  reports that she has never smoked. She has never used smokeless tobacco. Social: Ms. Pellegrino  reports that she has never smoked. She has never used smokeless tobacco. She reports that she does not drink alcohol and does not use drugs. 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 Surgical: Barbara Mooney  has a past surgical history that includes CARDIOVERSION (N/A, 01/20/2017); Bone biopsy (Right, 2018); Cataract extraction w/PHACO (Right, 02/15/2018); Cataract extraction w/PHACO (Left, 03/08/2018); Breast biopsy (Right, 02/26/2021); Colonoscopy; Breast lumpectomy (Right, 03/09/2021); Total laparoscopic hysterectomy with bilateral salpingo oophorectomy (N/A, 07/27/2023); Abdominal hysterectomy (2024); and Eye surgery. Family: family history includes Arthritis in her father; Atrial fibrillation in her sister; Breast cancer (age of onset: 31) in her sister; Cancer in her brother; Colon cancer in her maternal aunt; Diabetes in her mother; Heart disease in her brother; Hypertension in her father; Ovarian cancer in her maternal aunt; Stroke in her sister.  Laboratory Chemistry Profile   Renal Lab Results  Component Value Date   BUN 18 07/19/2024   CREATININE 0.95 07/19/2024   BCR 23 07/23/2020   GFR 53.61 (L) 07/19/2024   GFRAA 45 (L) 07/25/2020   GFRNONAA >60 06/06/2024    Hepatic Lab Results  Component Value Date   AST 15 06/03/2024   ALT 12 06/03/2024   ALBUMIN 3.2 (L) 06/03/2024   ALKPHOS 113 06/03/2024    Electrolytes Lab Results  Component Value Date   NA 143 07/19/2024   K 4.0 07/19/2024   CL 101 07/19/2024   CALCIUM 9.0 07/19/2024    Bone Lab Results  Component Value Date   VD25OH 13.19 (L) 04/19/2024    Inflammation (CRP: Acute Phase) (ESR: Chronic Phase) Lab Results  Component Value Date   LATICACIDVEN 1.6 10/28/2016         Note: Above Lab results reviewed.  Recent Imaging Review  DG FACET JT  INJ L /S SINGLE LEVEL RIGHT W/FL/CT CLINICAL DATA:  89 year old woman with history of bandlike lower back pain, greater on the right. She has some associated pain in hips and lower extremities, but the back pain bothers her the most. Outside lumbar spine MRI demonstrates right-greater-than-left facet arthropathy at L4-5 with periarticular edema on the right. Right L4-5 facet joint injection was requested.  EXAM: RIGHT L4-5 FACET INJECTION UNDER FLUOROSCOPY  TECHNIQUE: Overlying skin prepped with Betadine , draped in the usual sterile fashion, and infiltrated locally with buffered Lidocaine . Curved 22 gauge spinal needle advanced to the posterior aspect of the right L4-5 facet. Small injection of 0.2 mL Isovue -M 200 demonstrates intra-articular spread. 80 mg Depo-Medrol  and 0.5 mL Sensorcaine  0.5% was then administered. No immediate complication.  Patient reported excellent concordance with her typical lower back pain during injection and significant improvement in pain following the injection.  FLUOROSCOPY: Radiation Exposure Index (as provided by the fluoroscopic device): 2.30 mGy Kerma.  Fluoroscopy time: 24 seconds.  IMPRESSION: Technically successful right L4-5 facet injection under fluoroscopy.  Electronically Signed   By: Ryan Chess M.D.   On: 06/08/2024 15:48 Note: Reviewed        Physical Exam  Vitals: BP (!) 152/82   Pulse 87   Temp (!) 97.1 F (36.2 C)   Resp 16   Ht 5' 6 (1.676 m)   Wt 156 lb (70.8 kg)   SpO2 100%   BMI 25.18 kg/m  BMI: Estimated body mass index is 25.18 kg/m as calculated from the following:   Height as of this encounter: 5' 6 (1.676  m).   Weight as of this encounter: 156 lb (70.8 kg). Ideal: Ideal body weight: 59.3 kg (130 lb 11.7 oz) Adjusted ideal body weight: 63.9 kg (140 lb 13.4 oz) General appearance: Well nourished, well developed, and well hydrated. In no apparent acute distress Mental status: Alert, oriented x 3  (person, place, & time)       Respiratory: No evidence of acute respiratory distress Eyes: PERLA  Left knee pain, worse with WB  Assessment   Diagnosis  1. Primary osteoarthritis of left knee   2. Chronic pain of left knee      Updated Problems: No problems updated.  Plan of Care  Assessment and Plan    osteoarthritis of the left knee   Chronic left knee pain, due to osteoarthritis, has been present with intermittent exacerbations over the past 6-8 weeks. Previous knee injections provided temporary relief. Schedule a left knee steroid injection for Monday afternoon at 2:20 PM. Use over-the-counter pain relief gels such as Voltaren gel and continue taking Tylenol  for pain management.        Orders:  Orders Placed This Encounter  Procedures   KNEE INJECTION    Standing Status:   Future    Expected Date:   09/04/2024    Expiration Date:   08/21/2025    Where will this procedure be performed?:   [facility]    Return in about 6 days (around 08/27/2024) for Left knee steroid injection .    Recent Visits Date Type Provider Dept  06/20/24 Office Visit Marcelino Nurse, MD [facility]  Showing recent visits within past 90 days and meeting all other requirements Today's Visits Date Type Provider Dept  08/21/24 Office Visit Marcelino Nurse, MD [facility]  Showing today's visits and meeting all other requirements Future Appointments Date Type Provider Dept  08/27/24 Appointment Marcelino Nurse, MD [facility]  Showing future appointments within next 90 days and meeting all other requirements  I discussed the assessment and treatment plan with the patient. The patient was provided an opportunity to ask questions and all were answered. The patient agreed with the plan and demonstrated an understanding of the instructions.  Patient advised to call back or seek an in-person evaluation if the symptoms or condition worsens.  Duration of encounter:  .  Total time on encounter, as per AMA guidelines included both the face-to-face and non-face-to-face time personally spent by the physician and/or other qualified health care professional(s) on the day of the encounter (includes time in activities that require the physician or other qualified health care professional and does not include time in activities normally performed by clinical staff). Physician's time may include the following activities when performed: Preparing to see the patient (e.g., pre-charting review of records, searching for previously ordered imaging, lab work, and nerve conduction tests) Review of prior analgesic pharmacotherapies. Reviewing PMP Interpreting ordered tests (e.g., lab work, imaging, nerve conduction tests) Performing post-procedure evaluations, including interpretation of diagnostic procedures Obtaining and/or reviewing separately obtained history Performing a medically appropriate examination and/or evaluation Counseling and educating the patient/family/caregiver Ordering medications, tests, or procedures Referring and communicating with other health care professionals (when not separately reported) Documenting clinical information in the electronic or other health record Independently interpreting results (not separately reported) and communicating results to the patient/ family/caregiver Care coordination (not separately reported)  Note by: Nurse Marcelino, MD (TTS and AI technology used. I apologize for any typographical errors that were not detected and corrected.) Date: 08/21/2024; Time:  10:01 AM

## 2024-08-24 ENCOUNTER — Other Ambulatory Visit (INDEPENDENT_AMBULATORY_CARE_PROVIDER_SITE_OTHER)

## 2024-08-24 ENCOUNTER — Other Ambulatory Visit: Payer: Self-pay

## 2024-08-24 ENCOUNTER — Telehealth (INDEPENDENT_AMBULATORY_CARE_PROVIDER_SITE_OTHER): Admitting: Internal Medicine

## 2024-08-24 ENCOUNTER — Encounter: Payer: Self-pay | Admitting: Internal Medicine

## 2024-08-24 DIAGNOSIS — R300 Dysuria: Secondary | ICD-10-CM

## 2024-08-24 DIAGNOSIS — I10 Essential (primary) hypertension: Secondary | ICD-10-CM

## 2024-08-24 DIAGNOSIS — N9489 Other specified conditions associated with female genital organs and menstrual cycle: Secondary | ICD-10-CM

## 2024-08-24 LAB — URINALYSIS, ROUTINE W REFLEX MICROSCOPIC
Bilirubin Urine: NEGATIVE
Hgb urine dipstick: NEGATIVE
Ketones, ur: NEGATIVE
Nitrite: NEGATIVE
Specific Gravity, Urine: 1.01 (ref 1.000–1.030)
Total Protein, Urine: NEGATIVE
Urine Glucose: NEGATIVE
Urobilinogen, UA: 0.2 (ref 0.0–1.0)
pH: 7.5 (ref 5.0–8.0)

## 2024-08-24 MED ORDER — NYSTATIN 100000 UNIT/GM EX CREA: 1.0000 | TOPICAL_CREAM | Freq: Two times a day (BID) | CUTANEOUS | 0 refills | Status: DC

## 2024-08-24 MED ORDER — CEFDINIR 300 MG PO CAPS
300.0000 mg | ORAL_CAPSULE | Freq: Two times a day (BID) | ORAL | 0 refills | Status: DC
Start: 2024-08-24 — End: 2024-10-16

## 2024-08-24 NOTE — Telephone Encounter (Signed)
 Called patients daughter, patient complaining of burning with urination. Daughter has sterile cup at home. She is going to collect urine at home and drop it off before lunch time. Virtual acute visit scheduled with Dr Glendia. Dr Glendia is agreeable with plan. Orders placed for urine.

## 2024-08-24 NOTE — Progress Notes (Unsigned)
 Patient ID: Barbara  B Mooney, female   DOB: [birth year], 89 y.o.   MRN: [id number]   Virtual Visit via video Note  I connected with Barbara  Mooney by a video enabled telemedicine application and verified that I am speaking with the correct person using two identifiers. Location patient: home Location provider: work Persons participating in the virtual visit: patient, provider  The limitations, risks, security and privacy concerns of performing an evaluation and management service by video and the availability of in person appointments have been discussed. It has also been discussed with the patient that there may be a patient responsible charge related to this service. The patient expressed understanding and agreed to proceed.   Reason for visit: work in appt   HPI: Work in with concerns regarding possible UTI. Reports symptoms started two days ago. Noticed increased frequency and dysuria. Not feeling as well. No fever. No vomiting. No blood in her urine. No diarrhea. No increased abdominal pain. Eating.    ROS: See pertinent positives and negatives per HPI.  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 s/p XRT   GERD (gastroesophageal reflux disease)    History of hiatal hernia    HLD (hyperlipidemia)    HOH (hard of hearing)    Hypertension    Hypothyroidism     Mixed incontinence    MRSA infection    a.) remote history; posterior torso   Multiple thyroid  nodules    Peripheral edema    Permanent atrial fibrillation (HCC) 10/2016   a.) CHA2DS2VASc = 6 (age x2, sex, HTN, vascular disease history, T2DM);  b.) s/p DCCV 01/20/2017 (150 J x 1) --> failed; c.) rate/rhythm maintained on oral diltiazem ; chronically anticoagulated with apixaban    RBBB (right bundle branch block)    Recurrent UTI (urinary tract infection)    Unsteady gait    Varicose veins of both lower extremities    Vitamin B12 deficiency     Past Surgical History:  Procedure Laterality Date   ABDOMINAL HYSTERECTOMY  2024   BONE BIOPSY Right 2018   tibia   BREAST BIOPSY Right 02/26/2021   ffirm bx-X clip-HIGH-GRADE DUCTAL CARCINOMA IN SITU   BREAST LUMPECTOMY Right 03/09/2021   Procedure: BREAST LUMPECTOMY;  Surgeon: Dessa Reyes ORN, MD;  Location: [facility];  Service: General;  Laterality: Right;   CARDIOVERSION N/A 01/20/2017   Procedure: CARDIOVERSION;  Surgeon: Evalene JINNY Lunger, MD;  Location: [facility];  Service: Cardiovascular;  Laterality: N/A;   CATARACT EXTRACTION W/PHACO Right 02/15/2018   Procedure: CATARACT EXTRACTION PHACO AND INTRAOCULAR LENS PLACEMENT (IOC);  Surgeon: Jaye Fallow, MD;  Location: [facility];  Service: Ophthalmology;  Laterality: Right;  US  01:22.8 AP% 18.9 CDE 15.62 Fluid Pack Lot # [id number]   CATARACT EXTRACTION W/PHACO Left 03/08/2018   Procedure: CATARACT EXTRACTION  PHACO AND INTRAOCULAR LENS PLACEMENT (IOC);  Surgeon: Jaye Fallow, MD;  Location: [facility];  Service: Ophthalmology;  Laterality: Left;  US  01:05 AP% 15.8 CDE 10.28 Fluid pak lot # [id number]   COLONOSCOPY     EYE SURGERY     Catara you   TOTAL LAPAROSCOPIC HYSTERECTOMY WITH BILATERAL SALPINGO OOPHORECTOMY N/A 07/27/2023   Procedure: TOTAL LAPAROSCOPIC HYSTERECTOMY WITH BILATERAL SALPINGO OOPHORECTOMY, SENTINEL LYMPH NODE INJECTION AND MAPPING, NODE DISSECTION;   Surgeon: Mancil Barter, MD;  Location: [facility];  Service: Gynecology;  Laterality: N/A;    Family History  Problem Relation Age of Onset   Diabetes Mother    Arthritis Father    Hypertension Father    Breast cancer Sister 56   Stroke Sister    Atrial fibrillation Sister    Cancer Brother    Heart disease Brother    Ovarian cancer Maternal Aunt    Colon cancer Maternal Aunt    Kidney cancer Neg Hx    Bladder Cancer Neg Hx     SOCIAL HX: reviewed.    Current Outpatient Medications:    cefdinir  (OMNICEF ) 300 MG capsule, Take 1 capsule (300 mg total) by mouth 2 (two) times daily., Disp: 10 capsule, Rfl: 0   nystatin  cream (MYCOSTATIN ), Apply 1 Application topically 2 (two) times daily., Disp: 30 g, Rfl: 0   acetaminophen  (TYLENOL ) 500 MG tablet, Take 1 tablet (500 mg total) by mouth 3 (three) times daily., Disp: 30 tablet, Rfl: 0   apixaban  (ELIQUIS ) 5 MG TABS tablet, Take 5 mg by mouth once. Prescribed by Dr. Acquanetta, Disp: , Rfl:    Blood Glucose Monitoring Suppl (ONE TOUCH ULTRA 2) w/Device KIT, 1 EACH BY DOES NOT APPLY ROUTE DAILY., Disp: 1 kit, Rfl: 0   CRANBERRY PO, Take 1 capsule by mouth 2 (two) times daily. , Disp: , Rfl:    cyanocobalamin  (,VITAMIN B-12,) 1000 MCG/ML injection, Inject 1,000 mcg into the muscle every 30 (thirty) days. , Disp: , Rfl:    diltiazem  (CARDIZEM  CD) 180 MG 24 hr capsule, Take 1 capsule (180 mg total) by mouth 2 (two) times daily., Disp: 180 capsule, Rfl: 3   docusate sodium  (COLACE) 100 MG capsule, Take 1 capsule (100 mg total) by mouth 2 (two) times daily., Disp: 10 capsule, Rfl: 0   furosemide  (LASIX ) 20 MG tablet, Take 1 tablet (20 mg total) by mouth daily as needed for fluid or edema (SOB)., Disp: 90 tablet, Rfl: 3   Glucose Blood (BLOOD GLUCOSE TEST STRIPS) STRP, Use to check blood sugars once daily., Disp: 100 strip, Rfl: 12   LACTOBACILLUS PO, Take 1 tablet by mouth daily at 6 (six) AM., Disp: , Rfl:    Lancet Device MISC, Use to check  blood sugars once daily., Disp: 1 each, Rfl: 0   Lancets Misc. MISC, Use to check blood sugars once daily., Disp: 100 each, Rfl: 12   metFORMIN  (GLUCOPHAGE ) 500 MG tablet, Take 1 tablet (500 mg total) by mouth 2 (two) times daily with a meal., Disp: 180 tablet, Rfl: 1   olmesartan  (BENICAR ) 40 MG tablet, TAKE 1 TABLET BY MOUTH EVERY DAY, Disp: 90 tablet, Rfl: 3   omeprazole  (PRILOSEC) 20 MG capsule, TAKE 1 CAPSULE BY MOUTH EVERY DAY, Disp: 90 capsule, Rfl: 1   ondansetron  (ZOFRAN ) 8 MG tablet, Take 1 tablet (8 mg total) by mouth 2 (two) times daily as needed for nausea or vomiting., Disp: 20 tablet, Rfl: 0   polyethylene glycol powder (GLYCOLAX /MIRALAX ) 17 GM/SCOOP powder,  Take 17 g by mouth daily as needed for mild constipation., Disp: 238 g, Rfl: 0   Trolamine Salicylate (BLUE-EMU HEMP EX), Apply 1 application topically daily as needed (Knee pain)., Disp: , Rfl:   EXAM:  GENERAL: alert, oriented, appears well and in no acute distress  HEENT: atraumatic, conjunttiva clear, no obvious abnormalities on inspection of external nose and ears  NECK: normal movements of the head and neck  LUNGS: on inspection no signs of respiratory distress, breathing rate appears normal, no obvious gross SOB, gasping or wheezing  CV: no obvious cyanosis  PSYCH/NEURO: pleasant and cooperative, no obvious depression or anxiety, speech and thought processing grossly intact  ASSESSMENT AND PLAN:  Discussed the following assessment and plan:  Problem List Items Addressed This Visit     Vaginal burning   Also reported some vaginal burning - external. Cover with nystatin  cream. Treat UTI. Follow.       Essential hypertension   Continue olmesartan  and diltiazem .       Dysuria - Primary   Symptoms as outlined - dysuria and increased frequency. Urine - 11-20 wbc's and moderate leukocytes. Will send for culture. Given symptoms and urine findings, will treat for UTI. Treat with omnicef  as directed. Probiotics  while on abx and for two weeks after completing. Discussed given recurring UTIs referral to urology. Agreeable.        Return for keep scheduled. .   I discussed the assessment and treatment plan with the patient. The patient was provided an opportunity to ask questions and all were answered. The patient agreed with the plan and demonstrated an understanding of the instructions.   The patient was advised to call back or seek an in-person evaluation if the symptoms worsen or if the condition fails to improve as anticipated.    Allena Hamilton, MD

## 2024-08-24 NOTE — Telephone Encounter (Signed)
 Copied from CRM #[id number]. Topic: Clinical - Medical Advice >> Aug 24, 2024  9:06 AM Viola FALCON wrote: Patient daughter [name] requesting to speak with Sueanne regarding patient UTI - she's having burning when she urinates. [name] refused nurse triage. Please call her at [phone].

## 2024-08-25 ENCOUNTER — Ambulatory Visit: Payer: Self-pay | Admitting: Internal Medicine

## 2024-08-25 ENCOUNTER — Encounter: Payer: Self-pay | Admitting: Internal Medicine

## 2024-08-25 DIAGNOSIS — R300 Dysuria: Secondary | ICD-10-CM | POA: Insufficient documentation

## 2024-08-25 DIAGNOSIS — N9489 Other specified conditions associated with female genital organs and menstrual cycle: Secondary | ICD-10-CM | POA: Insufficient documentation

## 2024-08-25 NOTE — Assessment & Plan Note (Signed)
 Continue olmesartan  and diltiazem .

## 2024-08-25 NOTE — Assessment & Plan Note (Signed)
 Also reported some vaginal burning - external. Cover with nystatin  cream. Treat UTI. Follow.

## 2024-08-25 NOTE — Assessment & Plan Note (Signed)
 Symptoms as outlined - dysuria and increased frequency. Urine - 11-20 wbc's and moderate leukocytes. Will send for culture. Given symptoms and urine findings, will treat for UTI. Treat with omnicef  as directed. Probiotics while on abx and for two weeks after completing. Discussed given recurring UTIs referral to urology. Agreeable.

## 2024-08-27 ENCOUNTER — Ambulatory Visit
Attending: Student in an Organized Health Care Education/Training Program | Admitting: Student in an Organized Health Care Education/Training Program

## 2024-08-27 ENCOUNTER — Encounter: Payer: Self-pay | Admitting: Student in an Organized Health Care Education/Training Program

## 2024-08-27 VITALS — BP 139/75 | HR 82 | Temp 97.9°F | Resp 16 | Ht 66.0 in | Wt 160.0 lb

## 2024-08-27 DIAGNOSIS — M25562 Pain in left knee: Secondary | ICD-10-CM | POA: Insufficient documentation

## 2024-08-27 DIAGNOSIS — M1712 Unilateral primary osteoarthritis, left knee: Secondary | ICD-10-CM | POA: Insufficient documentation

## 2024-08-27 DIAGNOSIS — G8929 Other chronic pain: Secondary | ICD-10-CM | POA: Insufficient documentation

## 2024-08-27 LAB — URINE CULTURE
MICRO NUMBER:: 16961031
SPECIMEN QUALITY:: ADEQUATE

## 2024-08-27 MED ORDER — LIDOCAINE HCL 2 % IJ SOLN
20.0000 mL | Freq: Once | INTRAMUSCULAR | Status: AC
  Administered 2024-08-27: 400 mg
  Filled 2024-08-27: qty 20

## 2024-08-27 MED ORDER — ROPIVACAINE HCL 2 MG/ML IJ SOLN
4.0000 mL | Freq: Once | INTRAMUSCULAR | Status: AC
  Administered 2024-08-27: 4 mL via INTRA_ARTICULAR
  Filled 2024-08-27: qty 20

## 2024-08-27 MED ORDER — METHYLPREDNISOLONE ACETATE 40 MG/ML IJ SUSP
40.0000 mg | Freq: Once | INTRAMUSCULAR | Status: AC
  Administered 2024-08-27: 40 mg via INTRA_ARTICULAR
  Filled 2024-08-27: qty 1

## 2024-08-27 NOTE — Progress Notes (Signed)
 PROVIDER NOTE: Interpretation of information contained herein should be left to medically-trained personnel. Specific patient instructions are provided elsewhere under Patient Instructions section of medical record. This document was created in part using STT-dictation technology, any transcriptional errors that may result from this process are unintentional.  Patient: Jadelynn  B Mcleish Type: Established DOB: 1934-08-27 MRN: [id number] PCP: Glendia Shad, MD  Service: Procedure DOS: 08/27/2024 Setting: Ambulatory Location: Ambulatory outpatient facility Delivery: Face-to-face Provider: Wallie Sherry, MD Specialty: Interventional Pain Management Specialty designation: 09 Location: Outpatient facility Ref. Prov.: Glendia Shad, MD       Interventional Therapy   Type:  Steroid Intra-articular Knee Injection #1  Laterality: Left (-LT) Level/approach: Medial Imaging guidance: None required ([id number]) Anesthesia: Local anesthesia (1-2% Lidocaine ) DOS: 08/27/2024  Performed by: Wallie Sherry, MD  Purpose: Diagnostic/Therapeutic Indications: Knee arthralgia associated to osteoarthritis of the knee 1. Primary osteoarthritis of left knee   2. Chronic pain of left knee    NAS-11 score:   Pre-procedure: 5 /10   Post-procedure: 2 /10     Pre-Procedure Preparation  Monitoring: As per clinic protocol.  Risk Assessment: Vitals:  AFP:Zdupfjuzi body mass index is 25.82 kg/m as calculated from the following:   Height as of this encounter: 5' 6 (1.676 m).   Weight as of this encounter: 160 lb (72.6 kg)., Rate:82 , BP:139/75, Resp:16, Temp:97.9 F (36.6 C), SpO2:100 %  Allergies: She is allergic to nitrofurantoin  and sulfa antibiotics.  Precautions: No additional precautions required  Blood-thinner(s): None at this time  Coagulopathies: Reviewed. None identified.   Active Infection(s): Reviewed. None identified. Ms. Hodo is afebrile   Location setting: Exam room Position: Sitting w/  knee bent 90 degrees Safety Precautions: Patient was assessed for positional comfort and pressure points before starting the procedure. Prepping solution: DuraPrep (Iodine  Povacrylex [0.7% available iodine ] and Isopropyl Alcohol, 74% w/w) Prep Area: Entire knee region Approach: percutaneous, just above the tibial plateau, lateral to the infrapatellar tendon. Intended target: Intra-articular knee space Materials: Tray: Block Needle(s): Regular Qty: 1/side Length: 1.5-inch Gauge: 25G (x1) + 22G (x1)  Meds ordered this encounter  Medications   lidocaine  (XYLOCAINE ) 2 % (with pres) injection 400 mg   ropivacaine  (PF) 2 mg/mL (0.2%) (NAROPIN ) injection 4 mL   methylPREDNISolone  acetate (DEPO-MEDROL ) injection 40 mg    No orders of the defined types were placed in this encounter.    Time-out:   I initiated and conducted the Time-out before starting the procedure, as per protocol. The patient was asked to participate by confirming the accuracy of the Time Out information. Verification of the correct person, site, and procedure were performed and confirmed by me, the nursing staff, and the patient. Time-out conducted as per Joint Commission's Universal Protocol (UP.01.01.01). Procedure checklist: Completed  H&P (Pre-op  Assessment)  Ms. Glomb is a 89 y.o. (year old), female patient, seen today for interventional treatment. She  has a past surgical history that includes CARDIOVERSION (N/A, 01/20/2017); Bone biopsy (Right, 2018); Cataract extraction w/PHACO (Right, 02/15/2018); Cataract extraction w/PHACO (Left, 03/08/2018); Breast biopsy (Right, 02/26/2021); Colonoscopy; Breast lumpectomy (Right, 03/09/2021); Total laparoscopic hysterectomy with bilateral salpingo oophorectomy (N/A, 07/27/2023); Abdominal hysterectomy (2024); and Eye surgery. Ms. Mountz has a current medication list which includes the following prescription(s): acetaminophen , apixaban , one touch ultra 2, cefdinir , cefdinir ,  cranberry, cyanocobalamin , diltiazem , docusate sodium , furosemide , blood glucose test strips, lactobacillus, lancet device, lancets misc., metformin , nystatin  cream, olmesartan , omeprazole , ondansetron , polyethylene glycol powder, and trolamine salicylate. Her primarily concern today is the Knee Pain (left)  She  is allergic to nitrofurantoin  and sulfa antibiotics.   Last encounter: My last encounter with her was on 08/21/2024. Pertinent problems: Ms. Peden does not have any pertinent problems on file. Pain Assessment: Severity of Chronic pain is reported as a 5 /10. Location: Knee Left/denies. Onset: More than a month ago. Quality: Aching. Timing: Constant. Modifying factor(s): sitting. Vitals:  height is 5' 6 (1.676 m) and weight is 160 lb (72.6 kg). Her temperature is 97.9 F (36.6 C). Her blood pressure is 139/75 and her pulse is 82. Her respiration is 16 and oxygen saturation is 100%.   Reason for encounter: interventional pain management therapy due pain of at least four (4) weeks in duration, with failure to respond and/or inability to tolerate more conservative care.  Site Confirmation: Ms. Cuen was asked to confirm the procedure and laterality before marking the site.  Consent: Before the procedure and under the influence of no sedative(s), amnesic(s), or anxiolytics, the patient was informed of the treatment options, risks and possible complications. To fulfill our ethical and legal obligations, as recommended by the American Medical Association's Code of Ethics, I have informed the patient of my clinical impression; the nature and purpose of the treatment or procedure; the risks, benefits, and possible complications of the intervention; the alternatives, including doing nothing; the risk(s) and benefit(s) of the alternative treatment(s) or procedure(s); and the risk(s) and benefit(s) of doing nothing. The patient was provided information about the general risks and possible complications  associated with the procedure. These may include, but are not limited to: failure to achieve desired goals, infection, bleeding, organ or nerve damage, allergic reactions, paralysis, and death. In addition, the patient was informed of those risks and complications associated to Spine-related procedures, such as failure to decrease pain; infection (i.e.: Meningitis, epidural or intraspinal abscess); bleeding (i.e.: epidural hematoma, subarachnoid hemorrhage, or any other type of intraspinal or peri-dural bleeding); organ or nerve damage (i.e.: Any type of peripheral nerve, nerve root, or spinal cord injury) with subsequent damage to sensory, motor, and/or autonomic systems, resulting in permanent pain, numbness, and/or weakness of one or several areas of the body; allergic reactions; (i.e.: anaphylactic reaction); and/or death. Furthermore, the patient was informed of those risks and complications associated with the medications. These include, but are not limited to: allergic reactions (i.e.: anaphylactic or anaphylactoid reaction(s)); adrenal axis suppression; blood sugar elevation that in diabetics may result in ketoacidosis or comma; water retention that in patients with history of congestive heart failure may result in shortness of breath, pulmonary edema, and decompensation with resultant heart failure; weight gain; swelling or edema; medication-induced neural toxicity; particulate matter embolism and blood vessel occlusion with resultant organ, and/or nervous system infarction; and/or aseptic necrosis of one or more joints. Finally, the patient was informed that Medicine is not an exact science; therefore, there is also the possibility of unforeseen or unpredictable risks and/or possible complications that may result in a catastrophic outcome. The patient indicated having understood very clearly. We have given the patient no guarantees and we have made no promises. Enough time was given to the patient to  ask questions, all of which were answered to the patient's satisfaction. Ms. Markov has indicated that she wanted to continue with the procedure. Attestation: I, the ordering provider, attest that I have discussed with the patient the benefits, risks, side-effects, alternatives, likelihood of achieving goals, and potential problems during recovery for the procedure that I have provided informed consent.  Date  Time: 08/27/2024  2:17 PM  Description of procedure  Start Time:   hrs  Local Anesthesia: Once the patient was positioned, prepped, and time-out was completed. The target area was identified located. The skin was marked with an approved surgical skin marker. Once marked, the skin (epidermis, dermis, and hypodermis), and deeper tissues (fat, connective tissue and muscle) were infiltrated with a small amount of a short-acting local anesthetic, loaded on a 10cc syringe with a 25G, 1.5-in  Needle. An appropriate amount of time was allowed for local anesthetics to take effect before proceeding to the next step. Local Anesthetic: Lidocaine  1-2% The unused portion of the local anesthetic was discarded in the proper designated containers. Safety Precautions: Aspiration looking for blood return was conducted prior to all injections. At no point did I inject any substances, as a needle was being advanced. Before injecting, the patient was told to immediately notify me if she was experiencing any new onset of ringing in the ears, or metallic taste in the mouth. No attempts were made at seeking any paresthesias. Safe injection practices and needle disposal techniques used. Medications properly checked for expiration dates. SDV (single dose vial) medications used. After the completion of the procedure, all disposable equipment used was discarded in the proper designated medical waste containers.  Technical description: Protocol guidelines were followed. After positioning, the target area was identified and  prepped in the usual manner. Skin & deeper tissues infiltrated with local anesthetic. Appropriate amount of time allowed to pass for local anesthetics to take effect. Proper needle placement secured. Once satisfactory needle placement was confirmed, I proceeded to inject the desired solution in slow, incremental fashion, intermittently assessing for discomfort or any signs of abnormal or undesired spread of substance. Once completed, the needle was removed and disposed of, as per hospital protocols. The area was cleaned, making sure to leave some of the prepping solution back to take advantage of its long term bactericidal properties.  Aspiration:  Negative        Vitals:   08/27/24 1429  BP: 139/75  Pulse: 82  Resp: 16  Temp: 97.9 F (36.6 C)  SpO2: 100%  Weight: 160 lb (72.6 kg)  Height: 5' 6 (1.676 m)    End Time:   hrs  Imaging guidance  Imaging-assisted Technique: None required. Indication(s): N/A Exposure Time: N/A Contrast: None Fluoroscopic Guidance: N/A Ultrasound Guidance: N/A Interpretation: N/A  Post-op assessment  Post-procedure Vital Signs:  Pulse/HCG Rate: 82  Temp: 97.9 F (36.6 C) Resp: 16 BP: 139/75 SpO2: 100 %  EBL: None  Complications: No immediate post-treatment complications observed by team, or reported by patient.  Note: The patient tolerated the entire procedure well. A repeat set of vitals were taken after the procedure and the patient was kept under observation following institutional policy, for this type of procedure. Post-procedural neurological assessment was performed, showing return to baseline, prior to discharge. The patient was provided with post-procedure discharge instructions, including a section on how to identify potential problems. Should any problems arise concerning this procedure, the patient was given instructions to immediately contact us , at any time, without hesitation. In any case, we plan to contact the patient by  telephone for a follow-up status report regarding this interventional procedure.  Comments:  No additional relevant information.  Plan of care  Medications administered: We administered lidocaine , ropivacaine  (PF) 2 mg/mL (0.2%), and methylPREDNISolone  acetate.    Left knee steroid injection 08/27/24   Follow-up plan:   Return in about 4 weeks (around 09/24/2024) for PPE, VV.  Recent Visits Date Type Provider Dept  08/21/24 Office Visit Marcelino Nurse, MD [facility]  06/20/24 Office Visit Marcelino Nurse, MD [facility]  Showing recent visits within past 90 days and meeting all other requirements Today's Visits Date Type Provider Dept  08/27/24 Procedure visit Marcelino Nurse, MD [facility]  Showing today's visits and meeting all other requirements Future Appointments Date Type Provider Dept  09/27/24 Appointment Marcelino Nurse, MD [facility]  Showing future appointments within next 90 days and meeting all other requirements   Disposition: Discharge home  Discharge (Date  Time): 08/27/2024; 1457 hrs.   Primary Care Physician: Glendia Shad, MD Location: [facility] Note by: Nurse Marcelino, MD Date: 08/27/2024; Time: 3:19 PM  DISCLAIMER: Medicine is not an exact science. It has no guarantees or warranties. The decision to proceed with this intervention was based on the information collected from the patient. Conclusions were drawn from the patient's questionnaire, interview, and examination. Because information was provided in large part by the patient, it cannot be guaranteed that it has not been purposely or unconsciously manipulated or altered. Every effort has been made to obtain as much accurate, relevant, available data as possible. Always take into account that the treatment will also be dependent on availability of resources and existing treatment guidelines, considered by other Pain Management Specialists  as being common knowledge and practice, at the time of the intervention. It is also important to point out that variation in procedural techniques and pharmacological choices are the acceptable norm. For Medico-Legal review purposes, the indications, contraindications, technique, and results of the these procedures should only be evaluated, judged and interpreted by a Board-Certified Interventional Pain Specialist with extensive familiarity and expertise in the same exact procedure and technique.

## 2024-08-27 NOTE — Progress Notes (Signed)
 Pt stated that she does have a h/o breast cancer therefore she wanted to do both injections in same arm

## 2024-08-27 NOTE — Progress Notes (Signed)
 Safety precautions to be maintained throughout the outpatient stay will include: orient to surroundings, keep bed in low position, maintain call bell within reach at all times, provide assistance with transfer out of bed and ambulation.

## 2024-08-27 NOTE — Patient Instructions (Signed)

## 2024-08-28 ENCOUNTER — Telehealth: Payer: Self-pay | Admitting: *Deleted

## 2024-08-28 NOTE — Telephone Encounter (Signed)
 Post procedure call; spoke with her daughter, no questions or concerns.

## 2024-09-10 ENCOUNTER — Other Ambulatory Visit (INDEPENDENT_AMBULATORY_CARE_PROVIDER_SITE_OTHER): Payer: Self-pay | Admitting: Vascular Surgery

## 2024-09-10 DIAGNOSIS — R0989 Other specified symptoms and signs involving the circulatory and respiratory systems: Secondary | ICD-10-CM

## 2024-09-11 ENCOUNTER — Ambulatory Visit (INDEPENDENT_AMBULATORY_CARE_PROVIDER_SITE_OTHER): Payer: Self-pay

## 2024-09-11 ENCOUNTER — Ambulatory Visit (INDEPENDENT_AMBULATORY_CARE_PROVIDER_SITE_OTHER): Payer: Self-pay | Admitting: Nurse Practitioner

## 2024-09-11 ENCOUNTER — Encounter (INDEPENDENT_AMBULATORY_CARE_PROVIDER_SITE_OTHER): Payer: Self-pay | Admitting: Nurse Practitioner

## 2024-09-11 VITALS — BP 154/71 | HR 88 | Ht 66.0 in | Wt 164.2 lb

## 2024-09-11 DIAGNOSIS — Z794 Long term (current) use of insulin: Secondary | ICD-10-CM | POA: Diagnosis not present

## 2024-09-11 DIAGNOSIS — E118 Type 2 diabetes mellitus with unspecified complications: Secondary | ICD-10-CM | POA: Diagnosis not present

## 2024-09-11 DIAGNOSIS — R0989 Other specified symptoms and signs involving the circulatory and respiratory systems: Secondary | ICD-10-CM

## 2024-09-11 DIAGNOSIS — M7989 Other specified soft tissue disorders: Secondary | ICD-10-CM

## 2024-09-11 NOTE — Progress Notes (Signed)
 Subjective:    Patient ID: Barbara Mooney, female    DOB: 1934/05/25, 89 y.o.   MRN: [id number] Chief Complaint  Patient presents with   Establish Care    The patient is an 89 year old female who presents today as a referral from Dr. Glendia in regards to absent foot blood pulses.  However the patient denies episodes of claudication, rest pain or ulceration.  Patient notes that her issue is largely swelling primarily in the left lower extremity.  She also has arthralgias in this area and history of atrial fibrillation.  They had noninvasive studies show an ABI of 1.02 on the right and 1.4 on the left.  She has largely multiphasic waveforms of her tibials where these are mildly reduced at the 2    Review of Systems  Cardiovascular:  Positive for leg swelling.  Musculoskeletal:  Positive for arthralgias.  All other systems reviewed and are negative.      Objective:   Physical Exam Vitals reviewed.  HENT:     Head: Normocephalic.  Cardiovascular:     Rate and Rhythm: Normal rate.  Pulmonary:     Effort: Pulmonary effort is normal.  Skin:    General: Skin is warm and dry.  Neurological:     Mental Status: She is alert and oriented to person, place, and time.  Psychiatric:        Mood and Affect: Mood normal.        Behavior: Behavior normal.        Thought Content: Thought content normal.        Judgment: Judgment normal.     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   a.) stage 0 (high grade cTis (DCIS), cN0, cM0) with comedonecrosis -- > s/p lumpectomy + XRT   Endometrial cancer (HCC) 05/25/2023   a.) pathology  (+) for well differentiated endometroid carcinoma (FIGO 1)   Follicular non-Hodgkin's lymphoma (HCC) 2018   a.) 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 of the RIGHT proximal tibia s/p XRT   GERD (gastroesophageal reflux disease)    History of hiatal hernia    HLD (hyperlipidemia)    HOH (hard of hearing)    Hypertension    Hypothyroidism    Mixed incontinence    MRSA infection    a.) remote history; posterior torso   Multiple thyroid  nodules    Peripheral edema    Permanent atrial fibrillation (HCC) 10/2016   a.) CHA2DS2VASc = 6 (age x2, sex, HTN, vascular disease history, T2DM);  b.) s/p DCCV 01/20/2017 (150 J x 1) --> failed; c.) rate/rhythm maintained on oral diltiazem ; chronically anticoagulated with apixaban    RBBB (right bundle branch block)    Recurrent UTI (urinary tract infection)    Unsteady gait    Varicose veins of both lower extremities    Vitamin B12 deficiency     Social History   Socioeconomic History   Marital status: Widowed    Spouse  name: Not on file   Number of children: Not on file   Years of education: Not on file   Highest education level: 12th grade  Occupational History   Not on file  Tobacco Use   Smoking status: Never   Smokeless tobacco: Never  Vaping Use   Vaping status: Never Used  Substance and Sexual Activity   Alcohol use: No   Drug use: No   Sexual activity: Not Currently  Other Topics Concern   Not on file  Social History Narrative   Lives by self; son lives about 2 miles. Never smoked; no alcohol. Used to [employer]: Low Risk  (06/14/2024)   Overall Financial Resource Strain (CARDIA)    Difficulty of Paying Living Expenses: Not hard at all  Food Insecurity: No Food Insecurity (06/14/2024)   Hunger Vital Sign    Worried About Running Out of Food in the Last Year: Never true    Ran Out of Food in the Last Year: Never true  Transportation Needs: No  Transportation Needs (06/14/2024)   PRAPARE - Administrator, Civil Service (Medical): No    Lack of Transportation (Non-Medical): No  Physical Activity: Inactive (06/14/2024)   Exercise Vital Sign    Days of Exercise per Week: 0 days    Minutes of Exercise per Session: Not on file  Stress: No Stress Concern Present (06/14/2024)   [employer] of Occupational Health - Occupational Stress Questionnaire    Feeling of Stress: Only a little  Social Connections: Moderately Integrated (06/14/2024)   Social Connection and Isolation Panel    Frequency of Communication with Friends and Family: Three times a week    Frequency of Social Gatherings with Friends and Family: Once a week    Attends Religious Services: More than 4 times per year    Active Member of [employer] or Organizations: Yes    Attends Banker Meetings: More than 4 times per year    Marital Status: Widowed  Intimate Partner Violence: Not At Risk (07/27/2023)   Humiliation, Afraid, Rape, and Kick questionnaire    Fear of Current or Ex-Partner: No    Emotionally Abused: No    Physically Abused: No    Sexually Abused: No    Past Surgical History:  Procedure Laterality Date   ABDOMINAL HYSTERECTOMY  2024   BONE BIOPSY Right 2018   tibia   BREAST BIOPSY Right 02/26/2021   ffirm bx-X clip-HIGH-GRADE DUCTAL CARCINOMA IN SITU   BREAST LUMPECTOMY Right 03/09/2021   Procedure: BREAST LUMPECTOMY;  Surgeon: Dessa Reyes ORN, MD;  Location: [facility];  Service: General;  Laterality: Right;   CARDIOVERSION N/A 01/20/2017   Procedure: CARDIOVERSION;  Surgeon: Evalene JINNY Lunger, MD;  Location: [facility];  Service: Cardiovascular;  Laterality: N/A;   CATARACT EXTRACTION W/PHACO Right 02/15/2018   Procedure: CATARACT EXTRACTION PHACO AND INTRAOCULAR LENS PLACEMENT (IOC);  Surgeon: Jaye Fallow, MD;  Location: [facility];  Service: Ophthalmology;  Laterality: Right;  US  01:22.8 AP% 18.9 CDE 15.62 Fluid Pack Lot #  [id number]   CATARACT EXTRACTION W/PHACO Left 03/08/2018   Procedure: CATARACT EXTRACTION PHACO AND INTRAOCULAR LENS PLACEMENT (IOC);  Surgeon: Jaye Fallow, MD;  Location: [facility];  Service: Ophthalmology;  Laterality: Left;  US  01:05 AP% 15.8 CDE 10.28 Fluid pak lot # [id number]   COLONOSCOPY     EYE SURGERY     Catara you   TOTAL LAPAROSCOPIC HYSTERECTOMY WITH BILATERAL  SALPINGO OOPHORECTOMY N/A 07/27/2023   Procedure: TOTAL LAPAROSCOPIC HYSTERECTOMY WITH BILATERAL SALPINGO OOPHORECTOMY, SENTINEL LYMPH NODE INJECTION AND MAPPING, NODE DISSECTION;  Surgeon: Mancil Barter, MD;  Location: [facility];  Service: Gynecology;  Laterality: N/A;    Family History  Problem Relation Age of Onset   Diabetes Mother    Arthritis Father    Hypertension Father    Breast cancer Sister 14   Stroke Sister    Atrial fibrillation Sister    Cancer Brother    Heart disease Brother    Ovarian cancer Maternal Aunt    Colon cancer Maternal Aunt    Kidney cancer Neg Hx    Bladder Cancer Neg Hx     Allergies  Allergen Reactions   Nitrofurantoin  Diarrhea, Nausea Only and Other (See Comments)   Sulfa Antibiotics Nausea Only and Other (See Comments)       Latest Ref Rng & Units 07/09/2024    3:03 PM 06/06/2024    3:48 AM 06/05/2024    4:38 AM  CBC  WBC 4.0 - 10.5 K/uL 5.1  5.7  5.4   Hemoglobin 12.0 - 15.0 g/dL 88.0  87.9  87.6   Hematocrit 36.0 - 46.0 % 36.2  36.5  38.1   Platelets 150.0 - 400.0 K/uL 294.0  281  272       CMP     Component Value Date/Time   NA 143 07/19/2024 1345   NA 127 (L) 07/23/2020 1418   NA 136 04/10/2014 0449   K 4.0 07/19/2024 1345   K 3.1 (L) 04/10/2014 0449   CL 101 07/19/2024 1345   CL 97 (L) 04/10/2014 0449   CO2 31 07/19/2024 1345   CO2 33 (H) 04/10/2014 0449   GLUCOSE 141 (H) 07/19/2024 1345   GLUCOSE 175 (H) 04/10/2014 0449   BUN 18 07/19/2024 1345   BUN 25 07/23/2020 1418   BUN 17 04/10/2014 0449   CREATININE 0.95 07/19/2024 1345   CREATININE  0.85 04/10/2014 0449   CALCIUM 9.0 07/19/2024 1345   CALCIUM 8.8 04/10/2014 0449   PROT 6.4 (L) 06/03/2024 1224   PROT 7.6 04/10/2014 0449   ALBUMIN 3.2 (L) 06/03/2024 1224   ALBUMIN 3.7 04/10/2014 0449   AST 15 06/03/2024 1224   AST 25 04/10/2014 0449   ALT 12 06/03/2024 1224   ALT 26 04/10/2014 0449   ALKPHOS 113 06/03/2024 1224   ALKPHOS 100 04/10/2014 0449   BILITOT 0.6 06/03/2024 1224   BILITOT 0.4 04/10/2014 0449   GFR 53.61 (L) 07/19/2024 1345   GFRNONAA >60 06/06/2024 0348   GFRNONAA >60 04/10/2014 0449     No results found.     Assessment & Plan:   1. Absent foot pulse (Primary) Very mild PAD noted, nothing which will require intervention at this time.  We can follow this as needed.  2. Leg swelling I had a long discussion with the patient about rest and the nature of lower extremity edema.  They will begin to utilize medical grade compression stockings placed daily.  This should be 20 to 30 mmHg.  She is expressly advised not to sleep in these.  Will allow 6 to 8 weeks of conservative therapy before moving forward with use of a lymphedema pump.  3. Type 2 diabetes mellitus with complication, with long-term current use of insulin  (HCC) Continue hypoglycemic medications as already ordered, these medications have been reviewed and there are no changes at this time.  Hgb A1C to be monitored as already arranged by  primary service   Current Outpatient Medications on File Prior to Visit  Medication Sig Dispense Refill   acetaminophen  (TYLENOL ) 500 MG tablet Take 1 tablet (500 mg total) by mouth 3 (three) times daily. 30 tablet 0   apixaban  (ELIQUIS ) 5 MG TABS tablet Take 5 mg by mouth once. Prescribed by Dr. Acquanetta     Blood Glucose Monitoring Suppl (ONE TOUCH ULTRA 2) w/Device KIT 1 EACH BY DOES NOT APPLY ROUTE DAILY. 1 kit 0   cefdinir  (OMNICEF ) 300 MG capsule Take 1 capsule (300 mg total) by mouth 2 (two) times daily. 10 capsule 0   cefdinir  (OMNICEF ) 300 MG capsule  Take 300 mg by mouth 2 (two) times daily.     CRANBERRY PO Take 1 capsule by mouth 2 (two) times daily.      cyanocobalamin  (,VITAMIN B-12,) 1000 MCG/ML injection Inject 1,000 mcg into the muscle every 30 (thirty) days.      diltiazem  (CARDIZEM  CD) 180 MG 24 hr capsule Take 1 capsule (180 mg total) by mouth 2 (two) times daily. 180 capsule 3   docusate sodium  (COLACE) 100 MG capsule Take 1 capsule (100 mg total) by mouth 2 (two) times daily. 10 capsule 0   furosemide  (LASIX ) 20 MG tablet Take 1 tablet (20 mg total) by mouth daily as needed for fluid or edema (SOB). 90 tablet 3   Glucose Blood (BLOOD GLUCOSE TEST STRIPS) STRP Use to check blood sugars once daily. 100 strip 12   LACTOBACILLUS PO Take 1 tablet by mouth daily at 6 (six) AM.     Lancet Device MISC Use to check blood sugars once daily. 1 each 0   Lancets Misc. MISC Use to check blood sugars once daily. 100 each 12   metFORMIN  (GLUCOPHAGE ) 500 MG tablet Take 1 tablet (500 mg total) by mouth 2 (two) times daily with a meal. 180 tablet 1   nystatin  cream (MYCOSTATIN ) Apply 1 Application topically 2 (two) times daily. 30 g 0   olmesartan  (BENICAR ) 40 MG tablet TAKE 1 TABLET BY MOUTH EVERY DAY 90 tablet 3   omeprazole  (PRILOSEC) 20 MG capsule TAKE 1 CAPSULE BY MOUTH EVERY DAY 90 capsule 1   ondansetron  (ZOFRAN ) 8 MG tablet Take 1 tablet (8 mg total) by mouth 2 (two) times daily as needed for nausea or vomiting. 20 tablet 0   polyethylene glycol powder (GLYCOLAX /MIRALAX ) 17 GM/SCOOP powder Take 17 g by mouth daily as needed for mild constipation. 238 g 0   Trolamine Salicylate (BLUE-EMU HEMP EX) Apply 1 application topically daily as needed (Knee pain).     No current facility-administered medications on file prior to visit.    There are no Patient Instructions on file for this visit. No follow-ups on file.   Dekari Bures E Lylie Blacklock, NP

## 2024-09-14 LAB — VAS US ABI WITH/WO TBI
Left ABI: 1.04
Right ABI: 1.02

## 2024-09-21 ENCOUNTER — Ambulatory Visit (INDEPENDENT_AMBULATORY_CARE_PROVIDER_SITE_OTHER)

## 2024-09-21 ENCOUNTER — Other Ambulatory Visit

## 2024-09-21 DIAGNOSIS — E538 Deficiency of other specified B group vitamins: Secondary | ICD-10-CM

## 2024-09-21 MED ORDER — CYANOCOBALAMIN 1000 MCG/ML IJ SOLN
1000.0000 ug | Freq: Once | INTRAMUSCULAR | Status: AC
  Administered 2024-09-21: 1000 ug via INTRAMUSCULAR

## 2024-09-21 NOTE — Progress Notes (Signed)
 After obtaining consent, and per orders of Dr. Glendia, MD injection of B-12 given IM by Doyce Croak, CMA in L Deltoid. Patient tolerated well.

## 2024-09-27 ENCOUNTER — Ambulatory Visit: Admitting: Student in an Organized Health Care Education/Training Program

## 2024-09-28 ENCOUNTER — Other Ambulatory Visit: Payer: Self-pay | Admitting: *Deleted

## 2024-09-28 DIAGNOSIS — N183 Chronic kidney disease, stage 3 unspecified: Secondary | ICD-10-CM

## 2024-09-28 DIAGNOSIS — D649 Anemia, unspecified: Secondary | ICD-10-CM

## 2024-09-28 DIAGNOSIS — E782 Mixed hyperlipidemia: Secondary | ICD-10-CM

## 2024-09-28 DIAGNOSIS — I10 Essential (primary) hypertension: Secondary | ICD-10-CM

## 2024-09-29 ENCOUNTER — Other Ambulatory Visit: Payer: Self-pay | Admitting: Internal Medicine

## 2024-09-29 DIAGNOSIS — E782 Mixed hyperlipidemia: Secondary | ICD-10-CM

## 2024-09-29 DIAGNOSIS — E559 Vitamin D deficiency, unspecified: Secondary | ICD-10-CM

## 2024-09-29 NOTE — Progress Notes (Signed)
 Orders placed for future labs.

## 2024-10-09 ENCOUNTER — Ambulatory Visit
Attending: Student in an Organized Health Care Education/Training Program | Admitting: Student in an Organized Health Care Education/Training Program

## 2024-10-09 ENCOUNTER — Encounter: Payer: Self-pay | Admitting: Student in an Organized Health Care Education/Training Program

## 2024-10-09 ENCOUNTER — Ambulatory Visit: Admitting: Student in an Organized Health Care Education/Training Program

## 2024-10-09 VITALS — BP 162/90 | HR 54 | Temp 97.2°F | Resp 16 | Ht 66.0 in | Wt 164.0 lb

## 2024-10-09 DIAGNOSIS — M47816 Spondylosis without myelopathy or radiculopathy, lumbar region: Secondary | ICD-10-CM | POA: Diagnosis not present

## 2024-10-09 DIAGNOSIS — M25562 Pain in left knee: Secondary | ICD-10-CM | POA: Diagnosis present

## 2024-10-09 DIAGNOSIS — M1712 Unilateral primary osteoarthritis, left knee: Secondary | ICD-10-CM | POA: Diagnosis not present

## 2024-10-09 DIAGNOSIS — G8929 Other chronic pain: Secondary | ICD-10-CM | POA: Insufficient documentation

## 2024-10-09 NOTE — Patient Instructions (Signed)

## 2024-10-09 NOTE — Progress Notes (Signed)
 Safety precautions to be maintained throughout the outpatient stay will include: orient to surroundings, keep bed in low position, maintain call bell within reach at all times, provide assistance with transfer out of bed and ambulation.

## 2024-10-09 NOTE — Progress Notes (Signed)
 PROVIDER NOTE: Interpretation of information contained herein should be left to medically-trained personnel. Specific patient instructions are provided elsewhere under Patient Instructions section of medical record. This document was created in part using AI and STT-dictation technology, any transcriptional errors that may result from this process are unintentional.  Patient: Barbara Mooney  Service: E/M   PCP: Glendia Shad, MD  DOB: 05/19/1934  DOS: 10/09/2024  Provider: Wallie Sherry, MD  MRN: [id number]  Delivery: Face-to-face  Specialty: Interventional Pain Management  Type: Established Patient  Setting: Ambulatory outpatient facility  Specialty designation: 09  Referring Prov.: Glendia Shad, MD  Location: Outpatient office facility       History of present illness (HPI) Barbara Mooney, a 89 y.o. year old female, is here today because of her Primary osteoarthritis of left knee [M17.12]. Barbara Mooney primary complain today is Knee Pain (left) and Back Pain (Lower  back feeling weak and tired)    Pain Assessment: Severity of Chronic pain is reported as a  /10. Location: Knee Left/denies pain today. Onset: More than a month ago. Quality: Aching. Timing: Intermittent. Modifying factor(s): rest. Vitals:  height is 5' 6 (1.676 m) and weight is 164 lb (74.4 kg). Her temperature is 97.2 F (36.2 C) (abnormal). Her blood pressure is 162/90 (abnormal) and her pulse is 54 (abnormal). Her respiration is 16 and oxygen saturation is 100%.  BMI: Estimated body mass index is 26.47 kg/m as calculated from the following:   Height as of this encounter: 5' 6 (1.676 m).   Weight as of this encounter: 164 lb (74.4 kg).  Last encounter: 08/21/2024. Last procedure: 08/27/2024.  Reason for encounter:   Post-Procedure Evaluation   Type:  Steroid Intra-articular Knee Injection #1  Laterality: Left (-LT) Level/approach: Medial Imaging guidance: None required ([id number]) Anesthesia: Local  anesthesia (1-2% Lidocaine ) DOS: 08/27/2024  Performed by: Wallie Sherry, MD  Purpose: Diagnostic/Therapeutic Indications: Knee arthralgia associated to osteoarthritis of the knee 1. Primary osteoarthritis of left knee   2. Chronic pain of left knee    NAS-11 score:   Pre-procedure: 5 /10   Post-procedure: 2 /10     Effectiveness:  Initial hour after procedure: 100 %  Subsequent 4-6 hours post-procedure: 100 %  Analgesia past initial 6 hours: 75 %  Ongoing improvement:  Analgesic:  75% Function: Somewhat improved ROM: Somewhat improved   ROS  Constitutional: Denies any fever or chills Gastrointestinal: No reported hemesis, hematochezia, vomiting, or acute GI distress Musculoskeletal: improvement in left knee pain Neurological: No reported episodes of acute onset apraxia, aphasia, dysarthria, agnosia, amnesia, paralysis, loss of coordination, or loss of consciousness  Medication Review  Acetaminophen  Extra Strength, BLOOD GLUCOSE TEST STRIPS, Cranberry, Lactobacillus, Lancet Device, Lancets Misc., ONE TOUCH ULTRA 2, Trolamine Salicylate, apixaban , cefdinir , cyanocobalamin , diltiazem , docusate sodium , furosemide , metFORMIN , nystatin  cream, olmesartan , omeprazole , ondansetron , and polyethylene glycol powder  History Review  Allergy: Barbara Mooney is allergic to nitrofurantoin  and sulfa antibiotics. Drug: Barbara Mooney  reports no history of drug use. Alcohol:  reports no history of alcohol use. Tobacco:  reports that she has never smoked. She has never used smokeless tobacco. Social: Barbara Mooney  reports that she has never smoked. She has never used smokeless tobacco. She reports that she does not drink alcohol and does not use drugs. 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 Surgical: Barbara Mooney  has a past surgical history that includes CARDIOVERSION (N/A, 01/20/2017); Bone biopsy (Right, 2018); Cataract extraction w/PHACO (Right, 02/15/2018); Cataract extraction w/PHACO (Left, 03/08/2018); Breast biopsy (Right, 02/26/2021); Colonoscopy; Breast lumpectomy (Right, 03/09/2021); Total laparoscopic hysterectomy with bilateral salpingo oophorectomy (N/A, 07/27/2023); Abdominal hysterectomy (2024); and Eye surgery. Family: family history includes Arthritis in her father; Atrial fibrillation in her sister; Breast cancer (age of onset: 33) in her sister; Cancer in her brother; Colon cancer in her maternal aunt; Diabetes in her mother; Heart disease in her brother; Hypertension in her father; Ovarian cancer in her maternal aunt; Stroke in her sister.  Laboratory Chemistry Profile   Renal Lab Results  Component Value Date   BUN 18 07/19/2024   CREATININE 0.95 07/19/2024   BCR 23 07/23/2020   GFR 53.61 (L) 07/19/2024   GFRAA 45 (L) 07/25/2020   GFRNONAA >60 06/06/2024    Hepatic Lab Results  Component Value Date   AST 15 06/03/2024   ALT 12 06/03/2024   ALBUMIN 3.2 (L) 06/03/2024   ALKPHOS 113 06/03/2024    Electrolytes Lab Results  Component Value Date   NA 143 07/19/2024   K 4.0 07/19/2024   CL 101 07/19/2024   CALCIUM 9.0 07/19/2024    Bone Lab Results  Component Value Date   VD25OH 13.19 (L) 04/19/2024    Inflammation (CRP: Acute  Phase) (ESR: Chronic Phase) Lab Results  Component Value Date   LATICACIDVEN 1.6 10/28/2016         Note: Above Lab results reviewed.  Recent Imaging Review  VAS US  ABI WITH/WO TBI  LOWER EXTREMITY DOPPLER STUDY  Patient Name:  Barbara Mooney  Date of Exam:   09/11/2024 Medical Rec #: [id number]          Accession #:    [id number] Date of Birth: [birth year]           Patient Gender: F Patient Age:   74 years Exam Location:  [facility] Procedure:      VAS US  ABI WITH/WO TBI Referring Phys: Selinda Gu  --------------------------------------------------------------------------------   Indications: Decreased pulses felt at the level of the Ankle.   Comparison Study: 08/21/2020  Performing Technologist: Leafy Gibes RVS    Examination Guidelines: A complete evaluation includes at minimum, Doppler waveform signals and systolic blood pressure reading at the level of bilateral brachial, anterior tibial, and posterior tibial arteries, when vessel segments are accessible. Bilateral testing is considered an integral part of a complete examination. Photoelectric Plethysmograph (PPG) waveforms and toe systolic pressure readings are included as required and additional duplex testing as needed. Limited examinations for reoccurring indications may be performed as noted.    ABI Findings: +---------+------------------+-----+---------+--------------+ Right    Rt Pressure (mmHg)IndexWaveform Comment        +---------+------------------+-----+---------+--------------+ Brachial                                 Restricted Arm +---------+------------------+-----+---------+--------------+ ATA      132  0.89 biphasic                +---------+------------------+-----+---------+--------------+ PTA      152               1.02 triphasic               +---------+------------------+-----+---------+--------------+ Great Toe95                0.64  Abnormal                +---------+------------------+-----+---------+--------------+  +---------+------------------+-----+---------+-------+ Left     Lt Pressure (mmHg)IndexWaveform Comment +---------+------------------+-----+---------+-------+ Brachial 149                                     +---------+------------------+-----+---------+-------+ ATA      134               0.90 biphasic         +---------+------------------+-----+---------+-------+ PTA      154               1.03 triphasic        +---------+------------------+-----+---------+-------+ PERO     155                    biphasic 1.04    +---------+------------------+-----+---------+-------+ Great Toe93                0.62 Abnormal         +---------+------------------+-----+---------+-------+  +-------+-----------+-----------+------------+------------+ ABI/TBIToday's ABIToday's TBIPrevious ABIPrevious TBI +-------+-----------+-----------+------------+------------+ Right  1.02       .64        1.19        .81          +-------+-----------+-----------+------------+------------+ Left   1.04       .62        .97         .69          +-------+-----------+-----------+------------+------------+    Bilateral ABIs appear essentially unchanged compared to prior study on 08/21/2020. Bilateral TBIs appear decreased compared to prior study on 08/21/2020.   Summary: Right: Resting right ankle-brachial index is within normal range. The right toe-brachial index is abnormal.   Left: Resting left ankle-brachial index is within normal range. The left toe-brachial index is abnormal.    *See table(s) above for measurements and observations.    Electronically signed by Selinda Gu MD on 09/14/2024 at 9:39:25 AM.      Final   Note: Reviewed        Physical Exam  Vitals: BP (!) 162/90   Pulse (!) 54   Temp (!) 97.2 F (36.2 C)   Resp 16   Ht 5' 6 (1.676 m)   Wt 164 lb (74.4  kg)   SpO2 100%   BMI 26.47 kg/m  BMI: Estimated body mass index is 26.47 kg/m as calculated from the following:   Height as of this encounter: 5' 6 (1.676 m).   Weight as of this encounter: 164 lb (74.4 kg). Ideal: Ideal body weight: 59.3 kg (130 lb 11.7 oz) Adjusted ideal body weight: 65.3 kg (144 lb 0.6 oz) General appearance: Well nourished, well developed, and well hydrated. In no apparent acute distress Mental status: Alert, oriented x 3 (person, place, & time)       Respiratory: No evidence of acute respiratory distress Eyes: PERLA   Assessment   Diagnosis Status  1. Primary  osteoarthritis of left knee   2. Chronic pain of left knee   3. Lumbar facet arthropathy   4. Lumbar spondylosis    Controlled Controlled Controlled   Updated Problems: No problems updated.  Plan of Care  Left knee osteoarthritis with good response to intra-articular corticosteroid injection on 08/27/2024, with meaningful pain improvement--may repeat PRN for recurrent flares while continuing home exercise/physical therapy, acetaminophen /short NSAID trial if tolerated, and bracing/activity modification.  Chronic low back pain due to facet arthropathy with prior right lumbar medial branch nerve block (summer 2025- June 27th) providing relief--PRN order in place to repeat diagnostic/therapeutic block for symptom recurrence; if repeated blocks yield consistent, time-linked benefit, consider progressing to lumbar medial branch radiofrequency ablation for longer-term relief. Monitor function and pain scores; return sooner for red flags (new/worsening weakness, numbness, bowel/bladder changes) or inadequate response to the above.  Orders:  Orders Placed This Encounter  Procedures   KNEE INJECTION    Local Anesthetic & Steroid injection.    Standing Status:   Future    Expiration Date:   01/09/2025    Scheduling Instructions:     Procedure: Steroid knee injection     Side: LEFT     Sedation: None      Timeframe: As soon as schedule allows    Where will this procedure be performed?:   [facility]   LUMBAR FACET(MEDIAL BRANCH NERVE BLOCK) MBNB    Diagnosis: Lumbar Facet Syndrome (M47.816); Lumbosacral Facet Syndrome (M47.817); Lumbar Facet Joint Pain (M54.59) Medical Necessity Statement: 1.Severe chronic axial low back pain causing functional impairment documented by ongoing pain scale assessments. 2.Pain present for longer than 3 months (Chronic) documented to have failed noninvasive conservative therapies. 3.Absence of untreated radiculopathy. 4.There is no radiological evidence of untreated fractures, tumor, infection, or deformity.  Physical Examination Findings: Positive Kemp Maneuver: (Y)  Positive Lumbar Hyperextension-Rotation provocative test: (Y)    Standing Status:   Future    Expiration Date:   01/09/2025    Scheduling Instructions:     Procedure: Lumbar facet Block     Type: Medial Branch Block     Side:Bilateral     Purpose: Diagnostic Radiologic Mapping     Level(s):  L3-4, L4-5, L5-S1 y Fluoroscopic Mapping Facets (L3, L4, L5, Medial Branch)     Sedation: Patient's choice.     Timeframe: As soon as schedule allows.    Where will this procedure be performed?:   [facility]     Left knee steroid injection 08/27/24   Return for patient will call to schedule F2F appt prn.    Recent Visits Date Type Provider Dept  08/27/24 Procedure visit Marcelino Nurse, MD [facility]  08/21/24 Office Visit Marcelino Nurse, MD [facility]  Showing recent visits within past 90 days and meeting all other requirements Today's Visits Date Type Provider Dept  10/09/24 Office Visit Marcelino Nurse, MD [facility]  Showing today's visits and meeting all other requirements Future Appointments No visits were found meeting these conditions. Showing future appointments within next 90 days and meeting all other requirements  I discussed the  assessment and treatment plan with the patient. The patient was provided an opportunity to ask questions and all were answered. The patient agreed with the plan and demonstrated an understanding of the instructions.  Patient advised to call back or seek an in-person evaluation if the symptoms or condition worsens.  I personally spent a total of 30 minutes in the care of the  patient today including preparing to see the patient, getting/reviewing separately obtained history, performing a medically appropriate exam/evaluation, counseling and educating, placing orders, and documenting clinical information in the EHR.   Note by: Wallie Sherry, MD (TTS and AI technology used. I apologize for any typographical errors that were not detected and corrected.) Date: 10/09/2024; Time: 11:57 AM

## 2024-10-12 ENCOUNTER — Other Ambulatory Visit

## 2024-10-12 DIAGNOSIS — E782 Mixed hyperlipidemia: Secondary | ICD-10-CM

## 2024-10-12 DIAGNOSIS — N183 Chronic kidney disease, stage 3 unspecified: Secondary | ICD-10-CM | POA: Diagnosis not present

## 2024-10-12 DIAGNOSIS — E1122 Type 2 diabetes mellitus with diabetic chronic kidney disease: Secondary | ICD-10-CM

## 2024-10-12 DIAGNOSIS — E559 Vitamin D deficiency, unspecified: Secondary | ICD-10-CM | POA: Diagnosis not present

## 2024-10-12 DIAGNOSIS — I10 Essential (primary) hypertension: Secondary | ICD-10-CM

## 2024-10-12 DIAGNOSIS — D649 Anemia, unspecified: Secondary | ICD-10-CM

## 2024-10-12 LAB — HEPATIC FUNCTION PANEL
ALT: 9 U/L (ref 0–35)
AST: 11 U/L (ref 0–37)
Albumin: 4 g/dL (ref 3.5–5.2)
Alkaline Phosphatase: 119 U/L — ABNORMAL HIGH (ref 39–117)
Bilirubin, Direct: 0.1 mg/dL (ref 0.0–0.3)
Total Bilirubin: 0.3 mg/dL (ref 0.2–1.2)
Total Protein: 6.1 g/dL (ref 6.0–8.3)

## 2024-10-12 LAB — LIPID PANEL
Cholesterol: 210 mg/dL — ABNORMAL HIGH (ref 0–200)
HDL: 51.7 mg/dL (ref >39.00)
LDL Cholesterol: 135 mg/dL — ABNORMAL HIGH (ref 0–99)
NonHDL: 158.41
Total CHOL/HDL Ratio: 4
Triglycerides: 118 mg/dL (ref 0.0–149.0)
VLDL: 23.6 mg/dL (ref 0.0–40.0)

## 2024-10-12 LAB — CBC WITH DIFFERENTIAL/PLATELET
Basophils Absolute: 0 K/uL (ref 0.0–0.1)
Basophils Relative: 0.4 % (ref 0.0–3.0)
Eosinophils Absolute: 0.1 K/uL (ref 0.0–0.7)
Eosinophils Relative: 1.9 % (ref 0.0–5.0)
HCT: 35.1 % — ABNORMAL LOW (ref 36.0–46.0)
Hemoglobin: 11.7 g/dL — ABNORMAL LOW (ref 12.0–15.0)
Lymphocytes Relative: 9.1 % — ABNORMAL LOW (ref 12.0–46.0)
Lymphs Abs: 0.5 K/uL — ABNORMAL LOW (ref 0.7–4.0)
MCHC: 33.3 g/dL (ref 30.0–36.0)
MCV: 89.9 fl (ref 78.0–100.0)
Monocytes Absolute: 0.5 K/uL (ref 0.1–1.0)
Monocytes Relative: 9 % (ref 3.0–12.0)
Neutro Abs: 4.1 K/uL (ref 1.4–7.7)
Neutrophils Relative %: 79.6 % — ABNORMAL HIGH (ref 43.0–77.0)
Platelets: 262 K/uL (ref 150.0–400.0)
RBC: 3.9 Mil/uL (ref 3.87–5.11)
RDW: 14.9 % (ref 11.5–15.5)
WBC: 5.1 K/uL (ref 4.0–10.5)

## 2024-10-12 LAB — BASIC METABOLIC PANEL WITH GFR
BUN: 38 mg/dL — ABNORMAL HIGH (ref 6–23)
CO2: 31 meq/L (ref 19–32)
Calcium: 9.8 mg/dL (ref 8.4–10.5)
Chloride: 101 meq/L (ref 96–112)
Creatinine, Ser: 1.26 mg/dL — ABNORMAL HIGH (ref 0.40–1.20)
GFR: 38.14 mL/min — ABNORMAL LOW (ref >60.00)
Glucose, Bld: 123 mg/dL — ABNORMAL HIGH (ref 70–99)
Potassium: 5.1 meq/L (ref 3.5–5.1)
Sodium: 139 meq/L (ref 135–145)

## 2024-10-12 LAB — TSH: TSH: 2.14 u[IU]/mL (ref 0.35–5.50)

## 2024-10-12 LAB — HEMOGLOBIN A1C: Hgb A1c MFr Bld: 6.7 % — ABNORMAL HIGH (ref 4.6–6.5)

## 2024-10-12 LAB — VITAMIN D 25 HYDROXY (VIT D DEFICIENCY, FRACTURES): VITD: 32.22 ng/mL (ref 30.00–100.00)

## 2024-10-15 ENCOUNTER — Ambulatory Visit: Payer: Self-pay | Admitting: Internal Medicine

## 2024-10-15 ENCOUNTER — Ambulatory Visit: Admitting: Internal Medicine

## 2024-10-15 NOTE — Telephone Encounter (Signed)
 Copied from CRM [id number]. Topic: General - Other >> Oct 15, 2024 10:09 AM Alfonso HERO wrote: Reason for CRM: Patients daughter called back. I relayed the results to her. She states that the patient is staying hydrated, also that she is not taking any anti-inflammatory medication due to her blood thinner meds. She stated that the patient takes the Lasix  as needed maybe once a week if that.

## 2024-10-16 ENCOUNTER — Encounter: Payer: Self-pay | Admitting: Internal Medicine

## 2024-10-16 MED ORDER — OMEPRAZOLE 20 MG PO CPDR: 20.0000 mg | DELAYED_RELEASE_CAPSULE | Freq: Every day | ORAL | 1 refills | Status: AC

## 2024-10-18 NOTE — Progress Notes (Unsigned)
 Date:  10/19/2024   ID:  [name], DOB 10/18/1934, MRN [id number]  Patient Location:  [address] [name] [address]   Provider location:   [facility], [place] office  PCP:  Glendia Shad, MD  Cardiologist:  Perla MOCCASIN [facility]   Chief Complaint  Patient presents with   12 month follow up     Patient denies chest pain or shortness of breath.     History of Present Illness:    Barbara  B Mooney is a 89 y.o. 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 Moderate aortic atherosclerosis on CT scan Who presents for routine follow-up of her permanent atrial fibrillation, bradycardia  Presents today with her daughter Last seen in clinic by myself 11/24 In the hospital June 2025 tractable back pain Presenting after a fall June 03, 2024 spinal stenosis most severe at L4-L5   More urination in the day  Blood pressure is running high 150 up to 160 systolic at home on a regular basis Compliant with her diltiazem  ER 180 twice daily with telmisartan 40 in the morning  Feels weak Completed exercise regiment with PT, stronger than she was in June 2025, moving better Wears compression hose, denies significant leg swelling  Rare use of Lasix   CT scan lumbar spine pulled up and reviewed, moderate diffuse aortic atherosclerosis noted  Reviewed A1C 6.7 HGB 11.7 Cr 1.26, within her baseline Total chol 210  Completed radiation for endometrial cancer, 24 cycles  Lives alone, husband passed May 2021  Other past medical history reviewed Broke arm left in oct 2019  EKG personally reviewed by myself on todays visit EKG Interpretation Date/Time:  Friday October 19 2024 09:10:50 EST Ventricular  Rate:  64 PR Interval:    QRS Duration:  136 QT Interval:  432 QTC Calculation: 445 R Axis:   1  Text Interpretation: Atrial fibrillation with premature ventricular or aberrantly conducted complexes Right bundle branch block When compared with ECG of 03-Jun-2024 12:19, No significant change was found Confirmed by Perla Lye [phone]) on 10/19/2024 9:23:13 AM     This patients CHA2DS2-VASc Score and unadjusted Ischemic Stroke Rate (% per year) is equal to 7.2 % stroke rate/year from a score of 5   Above score calculated as 1 point each if present [CHF, HTN, DM, Vascular=MI/PAD/Aortic Plaque, Age if 65-74, or Female] Above score calculated as 2 points each if present [Age > 75, or Stroke/TIA/TE]  Cardioversion on 01/20/2017, back to NSR    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   a.) stage 0 (high grade cTis (DCIS), cN0, cM0) with comedonecrosis -- > s/p lumpectomy + XRT   Endometrial cancer (HCC) 05/25/2023   a.) pathology (+) for well differentiated endometroid carcinoma (FIGO 1)   Follicular non-Hodgkin's lymphoma (HCC) 2018   a.) 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 of  the RIGHT proximal tibia s/p XRT   GERD (gastroesophageal reflux disease)    History of hiatal hernia    HLD (hyperlipidemia)    HOH (hard of hearing)    Hypertension    Hypothyroidism    Mixed incontinence    MRSA infection    a.) remote history; posterior torso   Multiple thyroid  nodules    Peripheral edema    Permanent atrial fibrillation (HCC) 10/2016   a.) CHA2DS2VASc = 6 (age x2, sex, HTN, vascular disease history, T2DM);  b.) s/p DCCV 01/20/2017 (150 J x 1) --> failed; c.) rate/rhythm maintained on  oral diltiazem ; chronically anticoagulated with apixaban    RBBB (right bundle branch block)    Recurrent UTI (urinary tract infection)    Unsteady gait    Varicose veins of both lower extremities    Vitamin B12 deficiency    Past Surgical History:  Procedure Laterality Date   ABDOMINAL HYSTERECTOMY  2024   BONE BIOPSY Right 2018   tibia   BREAST BIOPSY Right 02/26/2021   ffirm bx-X clip-HIGH-GRADE DUCTAL CARCINOMA IN SITU   BREAST LUMPECTOMY Right 03/09/2021   Procedure: BREAST LUMPECTOMY;  Surgeon: Dessa Reyes ORN, MD;  Location: [facility];  Service: General;  Laterality: Right;   CARDIOVERSION N/A 01/20/2017   Procedure: CARDIOVERSION;  Surgeon: Evalene JINNY Lunger, MD;  Location: [facility];  Service: Cardiovascular;  Laterality: N/A;   CATARACT EXTRACTION W/PHACO Right 02/15/2018   Procedure: CATARACT EXTRACTION PHACO AND INTRAOCULAR LENS PLACEMENT (IOC);  Surgeon: Jaye Fallow, MD;  Location: [facility];  Service: Ophthalmology;  Laterality: Right;  US  01:22.8 AP% 18.9 CDE 15.62 Fluid Pack Lot # [id number]   CATARACT EXTRACTION W/PHACO Left 03/08/2018   Procedure: CATARACT EXTRACTION PHACO AND INTRAOCULAR LENS PLACEMENT (IOC);  Surgeon: Jaye Fallow, MD;  Location: [facility];  Service: Ophthalmology;  Laterality: Left;  US  01:05 AP% 15.8 CDE 10.28 Fluid pak lot # [id number]   COLONOSCOPY     EYE SURGERY     Catara you   TOTAL LAPAROSCOPIC HYSTERECTOMY WITH BILATERAL SALPINGO OOPHORECTOMY N/A 07/27/2023   Procedure: TOTAL LAPAROSCOPIC HYSTERECTOMY WITH BILATERAL SALPINGO OOPHORECTOMY, SENTINEL LYMPH NODE INJECTION AND MAPPING, NODE DISSECTION;  Surgeon: Mancil Barter, MD;  Location: [facility];  Service: Gynecology;  Laterality: N/A;     Current Meds  Medication Sig   ACCU-CHEK GUIDE TEST test strip daily.   acetaminophen  (TYLENOL ) 500 MG tablet Take 1 tablet (500 mg total) by mouth 3 (three) times daily.   apixaban  (ELIQUIS ) 5 MG TABS tablet Take 5 mg by mouth once.  Prescribed by Dr. Acquanetta   Blood Glucose Monitoring Suppl (ONE TOUCH ULTRA 2) w/Device KIT 1 EACH BY DOES NOT APPLY ROUTE DAILY.   CRANBERRY PO Take 1 capsule by mouth 2 (two) times daily.    cyanocobalamin  (,VITAMIN B-12,) 1000 MCG/ML injection Inject 1,000 mcg into the muscle every 30 (thirty) days.    diltiazem  (CARDIZEM  CD) 180 MG 24 hr capsule Take 1 capsule (180 mg total) by mouth 2 (two) times daily.   docusate sodium  (COLACE) 100 MG capsule Take 1 capsule (100 mg total) by mouth 2 (two) times daily.   furosemide  (LASIX ) 20 MG tablet Take 1 tablet (20 mg total) by mouth daily as needed for fluid or edema (SOB).   LACTOBACILLUS PO Take 1 tablet by mouth daily at 6 (six) AM.   Lancet Device MISC Use to check blood sugars once daily.   Lancets Misc. MISC Use to check blood sugars once daily.  metFORMIN  (GLUCOPHAGE ) 500 MG tablet Take 1 tablet (500 mg total) by mouth 2 (two) times daily with a meal.   nystatin  cream (MYCOSTATIN ) Apply 1 Application topically 2 (two) times daily.   olmesartan  (BENICAR ) 40 MG tablet TAKE 1 TABLET BY MOUTH EVERY DAY   omeprazole  (PRILOSEC) 20 MG capsule Take 1 capsule (20 mg total) by mouth daily.   ondansetron  (ZOFRAN ) 8 MG tablet Take 1 tablet (8 mg total) by mouth 2 (two) times daily as needed for nausea or vomiting.   polyethylene glycol powder (GLYCOLAX /MIRALAX ) 17 GM/SCOOP powder Take 17 g by mouth daily as needed for mild constipation.   Trolamine Salicylate (BLUE-EMU HEMP EX) Apply 1 application topically daily as needed (Knee pain).     Allergies:   Nitrofurantoin  and Sulfa antibiotics   Social History   Tobacco Use   Smoking status: Never   Smokeless tobacco: Never  Vaping Use   Vaping status: Never Used  Substance Use Topics   Alcohol use: No   Drug use: No      Family Hx: The patient's family history includes Arthritis in her father; Atrial fibrillation in her sister; Breast cancer (age of onset: 38) in her sister; Cancer in her  brother; Colon cancer in her maternal aunt; Diabetes in her mother; Heart disease in her brother; Hypertension in her father; Ovarian cancer in her maternal aunt; Stroke in her sister. There is no history of Kidney cancer or Bladder Cancer.  ROS:   Please see the history of present illness.    Review of Systems  Constitutional: Negative.   HENT: Negative.    Respiratory: Negative.    Cardiovascular: Negative.   Gastrointestinal: Negative.   Musculoskeletal: Negative.        Leg weakness  Neurological: Negative.   Psychiatric/Behavioral: Negative.    All other systems reviewed and are negative.   Labs/Other Tests and Data Reviewed:    Recent Labs: 10/12/2024: ALT 9; BUN 38; Creatinine, Ser 1.26; Hemoglobin 11.7; Platelets 262.0; Potassium 5.1; Sodium 139; TSH 2.14   Recent Lipid Panel Lab Results  Component Value Date/Time   CHOL 210 (H) 10/12/2024 09:32 AM   TRIG 118.0 10/12/2024 09:32 AM   HDL 51.70 10/12/2024 09:32 AM   CHOLHDL 4 10/12/2024 09:32 AM   LDLCALC 135 (H) 10/12/2024 09:32 AM    Wt Readings from Last 3 Encounters:  10/19/24 167 lb 2 oz (75.8 kg)  10/09/24 164 lb (74.4 kg)  09/11/24 164 lb 4 oz (74.5 kg)    Exam:    Vital Signs: BP (!) 150/80 (BP Location: Left Arm, Patient Position: Sitting, Cuff Size: Normal)   Pulse 64   Ht 5' 6 (1.676 m)   Wt 167 lb 2 oz (75.8 kg)   SpO2 97%   BMI 26.97 kg/m   Constitutional:  oriented to person, place, and time. No distress.  HENT:  Head: Normocephalic and atraumatic.  Eyes:  no discharge. No scleral icterus.  Neck: Normal range of motion. Neck supple. No JVD present.  Cardiovascular: Irregularly irregular intact distal pulses. Exam reveals no gallop and no friction rub. No edema No murmur heard. Pulmonary/Chest: Effort normal and breath sounds normal. No stridor. No respiratory distress.  no wheezes.  no rales.  no tenderness.  Abdominal: Soft.  no distension.  no tenderness.  Musculoskeletal: Normal range of  motion.  no  tenderness or deformity.  Neurological:  normal muscle tone. Coordination normal. No atrophy Skin: Skin is warm and dry. No rash noted. not diaphoretic.  Psychiatric:  normal mood and affect. behavior is normal. Thought content normal.     ASSESSMENT & PLAN:    Paroxysmal atrial fibrillation (HCC) - Rate well-controlled on diltiazem  ER 180 twice daily Tolerating Eliquis  5 twice daily   Essential hypertension Blood pressure running high at home Recommend she continue diltiazem  ER 180 twice daily with telmisartan 40 Recommend she start Cardura  1 mg daily for 1 week and if pressure continues to run high would increase dosing up to 1 mg twice daily Will avoid diuretics given renal dysfunction last week, avoid higher dose diltiazem  given potential for worsening leg swelling Rate relatively well-controlled, will avoid beta-blockers  Leg swelling Lasix  sparingly Wears compression hose  Aortic atherosclerosis Moderate diffuse disease on CT scan, cholesterol high Daughter concerned about a statin potential for leg weakness Recommend she start Zetia 10 mg daily Could also consider bempedoic acid versus Repatha if numbers continue to run high  Unsteady gait Has completed the regimen with PT   Hyperlipidemia Plan as above will start Zetia 10 daily  Signed, Evalene Lunger, MD  10/19/2024 9:26 AM    [facility] [address], [place], [place] [address]

## 2024-10-19 ENCOUNTER — Encounter: Payer: Self-pay | Admitting: Internal Medicine

## 2024-10-19 ENCOUNTER — Ambulatory Visit: Admitting: Internal Medicine

## 2024-10-19 ENCOUNTER — Encounter: Payer: Self-pay | Admitting: Cardiovascular Disease

## 2024-10-19 ENCOUNTER — Ambulatory Visit: Attending: Cardiovascular Disease | Admitting: Cardiovascular Disease

## 2024-10-19 VITALS — BP 120/80 | HR 73 | Temp 97.8°F | Ht 66.0 in | Wt 164.2 lb

## 2024-10-19 VITALS — BP 150/80 | HR 64 | Ht 66.0 in | Wt 167.1 lb

## 2024-10-19 DIAGNOSIS — I700 Atherosclerosis of aorta: Secondary | ICD-10-CM | POA: Insufficient documentation

## 2024-10-19 DIAGNOSIS — I10 Essential (primary) hypertension: Secondary | ICD-10-CM | POA: Diagnosis present

## 2024-10-19 DIAGNOSIS — R600 Localized edema: Secondary | ICD-10-CM | POA: Insufficient documentation

## 2024-10-19 DIAGNOSIS — E1122 Type 2 diabetes mellitus with diabetic chronic kidney disease: Secondary | ICD-10-CM

## 2024-10-19 DIAGNOSIS — I480 Paroxysmal atrial fibrillation: Secondary | ICD-10-CM

## 2024-10-19 DIAGNOSIS — R0600 Dyspnea, unspecified: Secondary | ICD-10-CM | POA: Diagnosis present

## 2024-10-19 DIAGNOSIS — N183 Chronic kidney disease, stage 3 unspecified: Secondary | ICD-10-CM | POA: Diagnosis not present

## 2024-10-19 DIAGNOSIS — I739 Peripheral vascular disease, unspecified: Secondary | ICD-10-CM | POA: Insufficient documentation

## 2024-10-19 DIAGNOSIS — D649 Anemia, unspecified: Secondary | ICD-10-CM

## 2024-10-19 DIAGNOSIS — E782 Mixed hyperlipidemia: Secondary | ICD-10-CM | POA: Diagnosis present

## 2024-10-19 DIAGNOSIS — E2839 Other primary ovarian failure: Secondary | ICD-10-CM

## 2024-10-19 DIAGNOSIS — I4821 Permanent atrial fibrillation: Secondary | ICD-10-CM | POA: Insufficient documentation

## 2024-10-19 DIAGNOSIS — R0989 Other specified symptoms and signs involving the circulatory and respiratory systems: Secondary | ICD-10-CM

## 2024-10-19 DIAGNOSIS — Z794 Long term (current) use of insulin: Secondary | ICD-10-CM

## 2024-10-19 DIAGNOSIS — C541 Malignant neoplasm of endometrium: Secondary | ICD-10-CM

## 2024-10-19 DIAGNOSIS — R5383 Other fatigue: Secondary | ICD-10-CM | POA: Diagnosis present

## 2024-10-19 DIAGNOSIS — K219 Gastro-esophageal reflux disease without esophagitis: Secondary | ICD-10-CM

## 2024-10-19 DIAGNOSIS — C8590 Non-Hodgkin lymphoma, unspecified, unspecified site: Secondary | ICD-10-CM

## 2024-10-19 DIAGNOSIS — R748 Abnormal levels of other serum enzymes: Secondary | ICD-10-CM | POA: Diagnosis not present

## 2024-10-19 DIAGNOSIS — N644 Mastodynia: Secondary | ICD-10-CM

## 2024-10-19 DIAGNOSIS — E559 Vitamin D deficiency, unspecified: Secondary | ICD-10-CM

## 2024-10-19 DIAGNOSIS — M545 Low back pain, unspecified: Secondary | ICD-10-CM

## 2024-10-19 DIAGNOSIS — E538 Deficiency of other specified B group vitamins: Secondary | ICD-10-CM

## 2024-10-19 LAB — IBC + FERRITIN
Ferritin: 13.4 ng/mL (ref 10.0–291.0)
Iron: 61 ug/dL (ref 42–145)
Saturation Ratios: 16.6 % — ABNORMAL LOW (ref 20.0–50.0)
TIBC: 368.2 ug/dL (ref 250.0–450.0)
Transferrin: 263 mg/dL (ref 212.0–360.0)

## 2024-10-19 LAB — HM DIABETES FOOT EXAM

## 2024-10-19 LAB — BASIC METABOLIC PANEL WITH GFR
BUN: 21 mg/dL (ref 6–23)
CO2: 30 meq/L (ref 19–32)
Calcium: 9.5 mg/dL (ref 8.4–10.5)
Chloride: 99 meq/L (ref 96–112)
Creatinine, Ser: 1.09 mg/dL (ref 0.40–1.20)
GFR: 45.38 mL/min — ABNORMAL LOW (ref >60.00)
Glucose, Bld: 81 mg/dL (ref 70–99)
Potassium: 3.9 meq/L (ref 3.5–5.1)
Sodium: 140 meq/L (ref 135–145)

## 2024-10-19 LAB — GAMMA GT: GGT: 11 U/L (ref 7–51)

## 2024-10-19 LAB — VITAMIN B12: Vitamin B-12: 328 pg/mL (ref 211–911)

## 2024-10-19 MED ORDER — EZETIMIBE 10 MG PO TABS: 10.0000 mg | ORAL_TABLET | Freq: Every day | ORAL | 3 refills | Status: AC

## 2024-10-19 MED ORDER — DOXAZOSIN MESYLATE 1 MG PO TABS: 1.0000 mg | ORAL_TABLET | Freq: Two times a day (BID) | ORAL | 3 refills | Status: DC

## 2024-10-19 MED ORDER — DILTIAZEM HCL ER COATED BEADS 180 MG PO CP24: 180.0000 mg | ORAL_CAPSULE | Freq: Two times a day (BID) | ORAL | 3 refills | Status: DC

## 2024-10-19 NOTE — Progress Notes (Signed)
 Subjective:    Patient ID: Barbara  B Mooney, female    DOB: 1934-02-09, 89 y.o.   MRN: [id number]  Patient here for  Chief Complaint  Patient presents with   Medical Management of Chronic Issues    3 mth f/u and review labs    HPI Here for a scheduled follow up - follow up regarding hypertension and chronic back pain. She is accompanied by her daughter. History obtained from both of them. Recently admitted 06/03/24 - 06/06/24. Presented after a fall. Admitted for management of intractable back pain with spinal stenosis most severe at L4-:L5. S/p facet injection - Friday 06/08/24. Saw vascular 09/11/24 - evaluation of absent foot pulse - s/p ABIs - very mild PAD noted. Recommended compression hose. Being followed by pain clinic - last seen 10/09/24 - left knee OA good response to steroid injection 08/27/24. Also followed - chronic low back pain. S/p right lumbar medial branch nerve block - 06/08/24 - helped. Had f/u with Dr Perla today - f/u afib and bradycardia. Reviewed outside blood pressure readings. Blood pressure running high 150-160. Taking diltiazem  and micardis. Dr Gollan started her on cardura  1mg  with instructions to titrated to 1mg  bid if blood pressure remained elevated. Also recommended starting zetia. Nausea is some better. Is moving around better. Some right breast pain.    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 of the RIGHT proximal tibia s/p XRT   GERD (gastroesophageal reflux disease)    History of hiatal hernia    HLD (hyperlipidemia)    HOH (hard of hearing)    Hypertension    Hypothyroidism    Mixed incontinence    MRSA infection    a.) remote history; posterior torso   Multiple thyroid  nodules    Peripheral edema    Permanent atrial fibrillation (HCC) 10/2016   a.) CHA2DS2VASc = 6 (age x2, sex, HTN, vascular disease history, T2DM);  b.) s/p DCCV 01/20/2017 (150 J x 1) --> failed; c.) rate/rhythm maintained on oral diltiazem ; chronically anticoagulated with apixaban    RBBB (right bundle branch block)    Recurrent UTI (urinary tract infection)    Unsteady gait    Varicose veins of both lower extremities    Vitamin B12 deficiency    Past Surgical History:  Procedure Laterality Date   ABDOMINAL HYSTERECTOMY  2024   BONE BIOPSY Right 2018   tibia   BREAST BIOPSY Right 02/26/2021   ffirm bx-X clip-HIGH-GRADE DUCTAL CARCINOMA IN SITU   BREAST LUMPECTOMY Right 03/09/2021   Procedure: BREAST LUMPECTOMY;  Surgeon: Dessa Reyes ORN, MD;  Location: [facility];  Service: General;  Laterality: Right;   CARDIOVERSION N/A 01/20/2017   Procedure: CARDIOVERSION;  Surgeon: Evalene JINNY Perla, MD;  Location: [facility];  Service: Cardiovascular;  Laterality: N/A;   CATARACT EXTRACTION W/PHACO Right 02/15/2018  Procedure: CATARACT EXTRACTION PHACO AND INTRAOCULAR LENS PLACEMENT (IOC);  Surgeon: Jaye Fallow, MD;  Location: [facility];  Service: Ophthalmology;  Laterality: Right;  US  01:22.8 AP% 18.9 CDE 15.62 Fluid Pack Lot # [id number]   CATARACT EXTRACTION W/PHACO Left 03/08/2018   Procedure: CATARACT EXTRACTION PHACO AND INTRAOCULAR LENS PLACEMENT (IOC);  Surgeon: Jaye Fallow, MD;  Location: [facility];  Service: Ophthalmology;  Laterality: Left;  US  01:05 AP% 15.8 CDE 10.28 Fluid pak lot # [id number]   COLONOSCOPY     EYE SURGERY     Catara you   TOTAL  LAPAROSCOPIC HYSTERECTOMY WITH BILATERAL SALPINGO OOPHORECTOMY N/A 07/27/2023   Procedure: TOTAL LAPAROSCOPIC HYSTERECTOMY WITH BILATERAL SALPINGO OOPHORECTOMY, SENTINEL LYMPH NODE INJECTION AND MAPPING, NODE DISSECTION;  Surgeon: Mancil Barter, MD;  Location: [facility];  Service: Gynecology;  Laterality: N/A;   Family History  Problem Relation Age of Onset   Diabetes Mother    Arthritis Father    Hypertension Father    Breast cancer Sister 94   Stroke Sister    Atrial fibrillation Sister    Cancer Brother    Heart disease Brother    Ovarian cancer Maternal Aunt    Colon cancer Maternal Aunt    Kidney cancer Neg Hx    Bladder Cancer Neg Hx    Social History   Socioeconomic History   Marital status: Widowed    Spouse name: Not on file   Number of children: Not on file   Years of education: Not on file   Highest education level: 12th grade  Occupational History   Not on file  Tobacco Use   Smoking status: Never   Smokeless tobacco: Never  Vaping Use   Vaping status: Never Used  Substance and Sexual Activity   Alcohol use: No   Drug use: No   Sexual activity: Not Currently  Other Topics Concern   Not on file  Social History Narrative   Lives by self; son lives about 2 miles. Never smoked; no alcohol. Used to [employer]: Low Risk  (06/14/2024)   Overall Financial Resource Strain (CARDIA)    Difficulty of Paying Living Expenses: Not hard at all  Food Insecurity: No Food Insecurity (06/14/2024)   Hunger Vital Sign    Worried About Running Out of Food in the Last Year: Never true    Ran Out of Food in the Last Year: Never true  Transportation Needs: No Transportation Needs (06/14/2024)   PRAPARE - Administrator, Civil Service (Medical): No    Lack of Transportation (Non-Medical): No  Physical Activity: Inactive (06/14/2024)   Exercise Vital Sign    Days of Exercise per Week: 0 days    Minutes of Exercise per  Session: Not on file  Stress: No Stress Concern Present (06/14/2024)   [employer] of Occupational Health - Occupational Stress Questionnaire    Feeling of Stress: Only a little  Social Connections: Moderately Integrated (06/14/2024)   Social Connection and Isolation Panel    Frequency of Communication with Friends and Family: Three times a week    Frequency of Social Gatherings with Friends and Family: Once a week    Attends Religious Services: More than 4 times per year    Active Member of [employer] or Organizations: Yes    Attends Banker Meetings: More than 4 times per year    Marital Status: Widowed     Review of Systems  Constitutional:  Negative for appetite change and unexpected weight change.  HENT:  Negative for congestion and sinus pressure.   Respiratory:  Negative for cough, chest tightness and shortness of breath.   Cardiovascular:  Negative for chest pain, palpitations and leg swelling.  Gastrointestinal:  Negative for abdominal pain, diarrhea and vomiting.       Nausea is some better.   Genitourinary:  Negative for difficulty urinating and dysuria.  Musculoskeletal:  Negative for joint swelling and myalgias.  Skin:  Negative for color change and rash.  Neurological:  Negative for dizziness and headaches.  Psychiatric/Behavioral:  Negative for agitation and dysphoric mood.        Objective:     BP 120/80   Pulse 73   Temp 97.8 F (36.6 C) (Oral)   Ht 5' 6 (1.676 m)   Wt 164 lb 4 oz (74.5 kg)   SpO2 98%   BMI 26.51 kg/m  Wt Readings from Last 3 Encounters:  10/24/24 169 lb 6.4 oz (76.8 kg)  10/19/24 164 lb 4 oz (74.5 kg)  10/19/24 167 lb 2 oz (75.8 kg)    Physical Exam Vitals reviewed.  Constitutional:      General: She is not in acute distress.    Appearance: Normal appearance.  HENT:     Head: Normocephalic and atraumatic.     Right Ear: External ear normal.     Left Ear: External ear normal.     Mouth/Throat:     Pharynx: No  oropharyngeal exudate or posterior oropharyngeal erythema.  Eyes:     General: No scleral icterus.       Right eye: No discharge.        Left eye: No discharge.     Conjunctiva/sclera: Conjunctivae normal.  Neck:     Thyroid : No thyromegaly.  Cardiovascular:     Rate and Rhythm: Normal rate and regular rhythm.  Pulmonary:     Effort: No respiratory distress.     Breath sounds: Normal breath sounds. No wheezing.     Comments: Breasts:  right breast - pain 1:00 with palpable fullness 2-3:00. No axillary adenopathy.  Abdominal:     General: Bowel sounds are normal.     Palpations: Abdomen is soft.     Tenderness: There is no abdominal tenderness.  Musculoskeletal:        General: No swelling or tenderness.     Cervical back: Neck supple. No tenderness.  Lymphadenopathy:     Cervical: No cervical adenopathy.  Skin:    Findings: No erythema or rash.  Neurological:     Mental Status: She is alert.  Psychiatric:        Mood and Affect: Mood normal.        Behavior: Behavior normal.         Outpatient Encounter Medications as of 10/19/2024  Medication Sig   ACCU-CHEK GUIDE TEST test strip daily.   acetaminophen  (TYLENOL ) 500 MG tablet Take 1 tablet (500 mg total) by mouth 3 (three) times daily.   apixaban  (ELIQUIS ) 5 MG TABS tablet Take 5 mg by mouth once. Prescribed by Dr. Acquanetta   Blood Glucose Monitoring Suppl (ONE TOUCH ULTRA 2) w/Device KIT 1 EACH BY DOES NOT APPLY ROUTE DAILY.   CRANBERRY PO Take 1 capsule by mouth 2 (two) times daily.    cyanocobalamin  (,VITAMIN B-12,) 1000 MCG/ML injection Inject 1,000 mcg into the muscle every 30 (thirty) days.    diltiazem  (CARDIZEM  CD) 180 MG 24 hr capsule Take 1 capsule (180  mg total) by mouth 2 (two) times daily.   docusate sodium  (COLACE) 100 MG capsule Take 1 capsule (100 mg total) by mouth 2 (two) times daily.   doxazosin  (CARDURA ) 1 MG tablet Take 1 tablet (1 mg total) by mouth 2 (two) times daily.   ezetimibe (ZETIA) 10 MG  tablet Take 1 tablet (10 mg total) by mouth daily.   furosemide  (LASIX ) 20 MG tablet Take 1 tablet (20 mg total) by mouth daily as needed for fluid or edema (SOB).   LACTOBACILLUS PO Take 1 tablet by mouth daily at 6 (six) AM.   Lancet Device MISC Use to check blood sugars once daily.   Lancets Misc. MISC Use to check blood sugars once daily.   metFORMIN  (GLUCOPHAGE ) 500 MG tablet Take 1 tablet (500 mg total) by mouth 2 (two) times daily with a meal.   nystatin  cream (MYCOSTATIN ) Apply 1 Application topically 2 (two) times daily.   olmesartan  (BENICAR ) 40 MG tablet TAKE 1 TABLET BY MOUTH EVERY DAY   omeprazole  (PRILOSEC) 20 MG capsule Take 1 capsule (20 mg total) by mouth daily.   ondansetron  (ZOFRAN ) 8 MG tablet Take 1 tablet (8 mg total) by mouth 2 (two) times daily as needed for nausea or vomiting.   polyethylene glycol powder (GLYCOLAX /MIRALAX ) 17 GM/SCOOP powder Take 17 g by mouth daily as needed for mild constipation.   Trolamine Salicylate (BLUE-EMU HEMP EX) Apply 1 application topically daily as needed (Knee pain).   [DISCONTINUED] cefdinir  (OMNICEF ) 300 MG capsule Take 1 capsule (300 mg total) by mouth 2 (two) times daily.   [DISCONTINUED] cefdinir  (OMNICEF ) 300 MG capsule Take 300 mg by mouth 2 (two) times daily.   [DISCONTINUED] diltiazem  (CARDIZEM  CD) 180 MG 24 hr capsule Take 1 capsule (180 mg total) by mouth 2 (two) times daily.   [DISCONTINUED] Glucose Blood (BLOOD GLUCOSE TEST STRIPS) STRP Use to check blood sugars once daily.   No facility-administered encounter medications on file as of 10/19/2024.     Lab Results  Component Value Date   WBC 5.1 10/12/2024   HGB 11.7 (L) 10/12/2024   HCT 35.1 (L) 10/12/2024   PLT 262.0 10/12/2024   GLUCOSE 81 10/19/2024   CHOL 210 (H) 10/12/2024   TRIG 118.0 10/12/2024   HDL 51.70 10/12/2024   LDLCALC 135 (H) 10/12/2024   ALT 9 10/12/2024   AST 11 10/12/2024   NA 140 10/19/2024   K 3.9 10/19/2024   CL 99 10/19/2024   CREATININE  1.09 10/19/2024   BUN 21 10/19/2024   CO2 30 10/19/2024   TSH 2.14 10/12/2024   INR 1.38 01/13/2017   HGBA1C 6.7 (H) 10/12/2024    DG FACET JT INJ L /S SINGLE LEVEL RIGHT W/FL/CT Result Date: 06/08/2024 CLINICAL DATA:  89 year old woman with history of bandlike lower back pain, greater on the right. She has some associated pain in hips and lower extremities, but the back pain bothers her the most. Outside lumbar spine MRI demonstrates right-greater-than-left facet arthropathy at L4-5 with periarticular edema on the right. Right L4-5 facet joint injection was requested. EXAM: RIGHT L4-5 FACET INJECTION UNDER FLUOROSCOPY TECHNIQUE: Overlying skin prepped with Betadine , draped in the usual sterile fashion, and infiltrated locally with buffered Lidocaine . Curved 22 gauge spinal needle advanced to the posterior aspect of the right L4-5 facet. Small injection of 0.2 mL Isovue -M 200 demonstrates intra-articular spread. 80 mg Depo-Medrol  and 0.5 mL Sensorcaine  0.5% was then administered. No immediate complication. Patient reported excellent concordance with her typical lower  back pain during injection and significant improvement in pain following the injection. FLUOROSCOPY: Radiation Exposure Index (as provided by the fluoroscopic device): 2.30 mGy Kerma. Fluoroscopy time: 24 seconds. IMPRESSION: Technically successful right L4-5 facet injection under fluoroscopy. Electronically Signed   By: Ryan Chess M.D.   On: 06/08/2024 15:48       Assessment & Plan:  Essential hypertension Assessment & Plan: Blood pressure elevated. Continue micardis and diltiazem . Dr Gollan recommended to start cardura . Follow.   Orders: -     Basic metabolic panel with GFR; Future  Type 2 diabetes mellitus with stage 3 chronic kidney disease, without long-term current use of insulin , unspecified whether stage 3a or 3b CKD (HCC) Assessment & Plan: Continue metformin . Follow met b and A1c.   Orders: -     Hemoglobin A1c;  Future -     Basic metabolic panel with GFR  Anemia, unspecified type Assessment & Plan: Check cbc.   Orders: -     CBC with Differential/Platelet; Future -     Vitamin B12 -     IBC + Ferritin  Hyperlipemia, mixed Assessment & Plan: Low cholesterol diet and exercise as tolerated. Dr Gollan recommended starting zetia. Follow lipid panel.  Lab Results  Component Value Date   CHOL 210 (H) 10/12/2024   HDL 51.70 10/12/2024   LDLCALC 135 (H) 10/12/2024   TRIG 118.0 10/12/2024   CHOLHDL 4 10/12/2024     Orders: -     Lipid panel; Future -     Hepatic function panel; Future  Estrogen deficiency -     DG Bone Density; Future  Breast pain Assessment & Plan: Right breast pain. Schedule for diagnostic right mammogram with possible ultrasound.   Orders: -     MM 3D DIAGNOSTIC MAMMOGRAM UNILATERAL RIGHT BREAST; Future -     US  LIMITED ULTRASOUND INCLUDING AXILLA RIGHT BREAST; Future  Elevated alkaline phosphatase level Assessment & Plan: Recheck alkaline phos and GGT. Remainder of liver panel wnl.   Orders: -     Gamma GT  Vitamin D  deficiency Assessment & Plan: Vitamin D  level 10/12/24 - wnl. Continue vitamin D3 supplements.    Vitamin B 12 deficiency Assessment & Plan: Continue b12 injections.    Paroxysmal atrial fibrillation (HCC) Assessment & Plan: On eliquis . Continues on diltiazem . Just saw Dr Gollan. No changes made. Stable.    Non-Hodgkin's lymphoma, unspecified body region, unspecified non-Hodgkin lymphoma type [facility]) Assessment & Plan: Treated at [facility]    Low back pain, unspecified back pain laterality, unspecified chronicity, unspecified whether sciatica present Assessment & Plan:  Recently admitted 06/03/24 - 06/06/24. Presented after a fall. Admitted for management of intractable back pain with spinal stenosis most severe at L4-:L5. S/p facet injection - Friday 06/08/24.    Gastroesophageal reflux disease without esophagitis Assessment &  Plan: Upper symptoms appear to be controlled. Continue prilosec. Nausea is some better.    Endometrial cancer, grade I [facility]) Assessment & Plan: Seeing oncology for continued surveillance.  Last evaluated 11/2023.    Absent foot pulse Assessment & Plan: Saw vascular 09/11/24 - evaluation of absent foot pulse - s/p ABIs - very mild PAD noted. Recommended compression hose.       Allena Hamilton, MD

## 2024-10-19 NOTE — Patient Instructions (Addendum)
 Medication Instructions:   Please start zetia 10 mg daily for cholesterol  Please start cardura  1 mg daily for one week If pressure remains elevated >140,  Please increase increase up to 1 mg twice a day  If you need a refill on your cardiac medications before your next appointment, please call your pharmacy.   Lab work: No new labs needed  Testing/Procedures: No new testing needed  Follow-Up: At [facility], you and your health needs are our priority.  As part of our continuing mission to provide you with exceptional heart care, we have created designated Provider Care Teams.  These Care Teams include your primary Cardiologist (physician) and Advanced Practice Providers (APPs -  Physician Assistants and Nurse Practitioners) who all work together to provide you with the care you need, when you need it.  You will need a follow up appointment in 6 months  Providers on your designated Care Team:   Lonni Meager, NP Bernardino Bring, PA-C [place], [place]  COVID-19 Vaccine Information can be found at: [email or web address] For questions related to vaccine distribution or appointments, please email [email or web address] or call [phone].

## 2024-10-20 ENCOUNTER — Encounter: Payer: Self-pay | Admitting: Internal Medicine

## 2024-10-21 ENCOUNTER — Ambulatory Visit: Payer: Self-pay | Admitting: Internal Medicine

## 2024-10-22 NOTE — Telephone Encounter (Signed)
 See result note for lab results and response to her question.

## 2024-10-23 ENCOUNTER — Other Ambulatory Visit (INDEPENDENT_AMBULATORY_CARE_PROVIDER_SITE_OTHER): Payer: Self-pay | Admitting: Nurse Practitioner

## 2024-10-23 DIAGNOSIS — M7989 Other specified soft tissue disorders: Secondary | ICD-10-CM

## 2024-10-23 NOTE — Telephone Encounter (Signed)
Ok to add miralax

## 2024-10-24 ENCOUNTER — Encounter (INDEPENDENT_AMBULATORY_CARE_PROVIDER_SITE_OTHER): Payer: Self-pay | Admitting: Nurse Practitioner

## 2024-10-24 ENCOUNTER — Ambulatory Visit (INDEPENDENT_AMBULATORY_CARE_PROVIDER_SITE_OTHER)

## 2024-10-24 ENCOUNTER — Ambulatory Visit
Admission: RE | Admit: 2024-10-24 | Discharge: 2024-10-24 | Disposition: A | Discharge status: Home / Self Care | Source: Ambulatory Visit | Attending: Internal Medicine | Admitting: Internal Medicine

## 2024-10-24 ENCOUNTER — Ambulatory Visit (INDEPENDENT_AMBULATORY_CARE_PROVIDER_SITE_OTHER): Admitting: Nurse Practitioner

## 2024-10-24 ENCOUNTER — Ambulatory Visit

## 2024-10-24 VITALS — BP 126/71 | HR 84 | Resp 18 | Ht 66.0 in | Wt 169.4 lb

## 2024-10-24 DIAGNOSIS — E538 Deficiency of other specified B group vitamins: Secondary | ICD-10-CM

## 2024-10-24 DIAGNOSIS — M7989 Other specified soft tissue disorders: Secondary | ICD-10-CM | POA: Diagnosis not present

## 2024-10-24 DIAGNOSIS — I10 Essential (primary) hypertension: Secondary | ICD-10-CM

## 2024-10-24 DIAGNOSIS — N644 Mastodynia: Secondary | ICD-10-CM | POA: Insufficient documentation

## 2024-10-24 DIAGNOSIS — N1831 Chronic kidney disease, stage 3a: Secondary | ICD-10-CM

## 2024-10-24 DIAGNOSIS — E1122 Type 2 diabetes mellitus with diabetic chronic kidney disease: Secondary | ICD-10-CM | POA: Diagnosis not present

## 2024-10-24 DIAGNOSIS — I890 Lymphedema, not elsewhere classified: Secondary | ICD-10-CM | POA: Diagnosis not present

## 2024-10-24 MED ORDER — CYANOCOBALAMIN 1000 MCG/ML IJ SOLN
1000.0000 ug | Freq: Once | INTRAMUSCULAR | Status: AC
  Administered 2024-10-24: 1000 ug via INTRAMUSCULAR

## 2024-10-24 NOTE — Progress Notes (Signed)
 Patient was administered a B12 injection into her left deltoid. Patient tolerated the B12 injection well.

## 2024-10-26 ENCOUNTER — Other Ambulatory Visit: Payer: Self-pay | Admitting: Internal Medicine

## 2024-10-26 DIAGNOSIS — Z1231 Encounter for screening mammogram for malignant neoplasm of breast: Secondary | ICD-10-CM

## 2024-10-29 ENCOUNTER — Encounter: Payer: Self-pay | Admitting: Internal Medicine

## 2024-10-29 ENCOUNTER — Ambulatory Visit: Admitting: Cardiovascular Disease

## 2024-10-29 NOTE — Assessment & Plan Note (Signed)
 Right breast pain. Schedule for diagnostic right mammogram with possible ultrasound.

## 2024-10-29 NOTE — Assessment & Plan Note (Signed)
 Seeing oncology for continued surveillance.  Last evaluated 11/2023.

## 2024-10-29 NOTE — Assessment & Plan Note (Signed)
 Treated at [facility].

## 2024-10-29 NOTE — Assessment & Plan Note (Signed)
 Vitamin D  level 10/12/24 - wnl. Continue vitamin D3 supplements.

## 2024-10-29 NOTE — Assessment & Plan Note (Signed)
 Saw vascular 09/11/24 - evaluation of absent foot pulse - s/p ABIs - very mild PAD noted. Recommended compression hose.

## 2024-10-29 NOTE — Assessment & Plan Note (Signed)
 Low cholesterol diet and exercise as tolerated. Dr Gollan recommended starting zetia. Follow lipid panel.  Lab Results  Component Value Date   CHOL 210 (H) 10/12/2024   HDL 51.70 10/12/2024   LDLCALC 135 (H) 10/12/2024   TRIG 118.0 10/12/2024   CHOLHDL 4 10/12/2024

## 2024-10-29 NOTE — Assessment & Plan Note (Signed)
 Upper symptoms appear to be controlled. Continue prilosec. Nausea is some better.

## 2024-10-29 NOTE — Assessment & Plan Note (Signed)
 Check cbc

## 2024-10-29 NOTE — Assessment & Plan Note (Signed)
 Recently admitted 06/03/24 - 06/06/24. Presented after a fall. Admitted for management of intractable back pain with spinal stenosis most severe at L4-:L5. S/p facet injection - Friday 06/08/24.

## 2024-10-29 NOTE — Assessment & Plan Note (Signed)
 Blood pressure elevated. Continue micardis and diltiazem . Dr Gollan recommended to start cardura . Follow.

## 2024-10-29 NOTE — Assessment & Plan Note (Signed)
Continue b12 injections.  

## 2024-10-29 NOTE — Assessment & Plan Note (Signed)
 Recheck alkaline phos and GGT. Remainder of liver panel wnl.

## 2024-10-29 NOTE — Assessment & Plan Note (Signed)
 Continue metformin . Follow met b and A1c.

## 2024-10-29 NOTE — Assessment & Plan Note (Signed)
 On eliquis . Continues on diltiazem . Just saw Dr Perla. No changes made. Stable.

## 2024-11-04 ENCOUNTER — Encounter (INDEPENDENT_AMBULATORY_CARE_PROVIDER_SITE_OTHER): Payer: Self-pay | Admitting: Nurse Practitioner

## 2024-11-04 NOTE — Progress Notes (Signed)
 Subjective:    Patient ID: Barbara Mooney, female    DOB: Nov 15, 1934, 89 y.o.   MRN: [id number] Chief Complaint  Patient presents with   Follow-up    6-8 week follow up bilateral venous reflux     The patient returns to the office for followup evaluation regarding leg swelling.  The swelling has persisted and the pain associated with swelling continues. There have not been any interval development of a ulcerations or wounds.  Since the previous visit the patient has been wearing graduated compression stockings and has noted little if any improvement in the lymphedema. The patient has been using compression routinely morning until night.  The patient also states elevation during the day and exercise is being done too.   Today noninvasive studies show no evidence of DVT or superficial phlebitis bilaterally.  No evidence of deep venous insufficiency bilaterally but there is evidence of superficial venous reflux bilaterally.    Review of Systems  Cardiovascular:  Positive for leg swelling.  All other systems reviewed and are negative.      Objective:   Physical Exam Vitals reviewed.  HENT:     Head: Normocephalic.  Cardiovascular:     Rate and Rhythm: Normal rate.     Pulses: Normal pulses.  Pulmonary:     Effort: Pulmonary effort is normal.  Musculoskeletal:     Left lower leg: Edema present.  Skin:    General: Skin is warm and dry.  Neurological:     Mental Status: She is alert and oriented to person, place, and time.  Psychiatric:        Mood and Affect: Mood normal.        Behavior: Behavior normal.        Thought Content: Thought content normal.        Judgment: Judgment normal.     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   a.) stage 0 (high grade cTis (DCIS), cN0, cM0) with comedonecrosis -- > s/p lumpectomy + XRT   Endometrial cancer (HCC) 05/25/2023   a.) pathology (+) for well differentiated endometroid carcinoma (FIGO 1)   Follicular non-Hodgkin's lymphoma (HCC) 2018   a.) 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 of the RIGHT proximal tibia s/p XRT   GERD (gastroesophageal reflux disease)    History of hiatal hernia    HLD (hyperlipidemia)    HOH (hard of hearing)    Hypertension    Hypothyroidism    Mixed incontinence    MRSA infection    a.) remote history; posterior torso   Multiple thyroid  nodules    Peripheral edema    Permanent atrial fibrillation (HCC) 10/2016   a.) CHA2DS2VASc = 6 (age x2, sex, HTN, vascular disease history, T2DM);  b.) s/p DCCV 01/20/2017 (150 J x 1) --> failed; c.) rate/rhythm maintained on oral diltiazem ; chronically anticoagulated with apixaban    RBBB (right  bundle branch block)    Recurrent UTI (urinary tract infection)    Unsteady gait    Varicose veins of both lower extremities    Vitamin B12 deficiency     Social History   Socioeconomic History   Marital status: Widowed    Spouse name: Not on file   Number of children: Not on file   Years of education: Not on file   Highest education level: 12th grade  Occupational History   Not on file  Tobacco Use   Smoking status: Never   Smokeless tobacco: Never  Vaping Use   Vaping status: Never Used  Substance and Sexual Activity   Alcohol use: No   Drug use: No   Sexual activity: Not Currently  Other Topics Concern   Not on file  Social History Narrative   Lives by self; son lives about 2 miles. Never smoked; no alcohol. Used to [employer]: Low Risk  (06/14/2024)   Overall Financial Resource Strain (CARDIA)    Difficulty of Paying Living Expenses: Not hard at all  Food Insecurity: No Food Insecurity (06/14/2024)   Hunger Vital Sign    Worried About Running Out of Food in the Last Year: Never true    Ran Out of Food in the Last Year: Never true  Transportation Needs: No Transportation Needs (06/14/2024)   PRAPARE - Administrator, Civil Service (Medical): No    Lack of Transportation (Non-Medical): No  Physical Activity: Inactive (06/14/2024)   Exercise Vital Sign    Days of Exercise per Week: 0 days    Minutes of Exercise per Session: Not on file  Stress: No Stress Concern Present (06/14/2024)   [employer] of Occupational Health - Occupational Stress Questionnaire    Feeling of Stress: Only a little  Social Connections: Moderately Integrated (06/14/2024)   Social Connection and Isolation Panel    Frequency of Communication with Friends and Family: Three times a week    Frequency of Social Gatherings with Friends and Family: Once a week    Attends Religious Services: More than 4 times per year    Active Member of [employer] or Organizations: Yes    Attends Banker Meetings: More than 4 times per year    Marital Status: Widowed  Intimate Partner Violence: Not At Risk (07/27/2023)   Humiliation, Afraid, Rape, and Kick questionnaire    Fear of Current or Ex-Partner: No    Emotionally Abused: No    Physically Abused: No    Sexually Abused: No    Past Surgical History:  Procedure Laterality Date   ABDOMINAL HYSTERECTOMY  2024   BONE BIOPSY Right 2018   tibia   BREAST BIOPSY Right 02/26/2021   ffirm bx-X clip-HIGH-GRADE DUCTAL CARCINOMA IN SITU   BREAST LUMPECTOMY Right 03/09/2021   Procedure: BREAST LUMPECTOMY;  Surgeon: Dessa Reyes ORN, MD;  Location: [facility];  Service: General;  Laterality: Right;   CARDIOVERSION N/A 01/20/2017   Procedure: CARDIOVERSION;  Surgeon: Evalene JINNY Lunger, MD;   Location: [facility];  Service: Cardiovascular;  Laterality: N/A;   CATARACT EXTRACTION W/PHACO Right 02/15/2018   Procedure: CATARACT EXTRACTION PHACO AND INTRAOCULAR LENS PLACEMENT (IOC);  Surgeon: Jaye Fallow, MD;  Location: [facility];  Service: Ophthalmology;  Laterality: Right;  US  01:22.8 AP% 18.9 CDE 15.62 Fluid Pack Lot # [id number]   CATARACT EXTRACTION W/PHACO Left 03/08/2018   Procedure: CATARACT EXTRACTION PHACO AND INTRAOCULAR LENS PLACEMENT (  IOC);  Surgeon: Jaye Fallow, MD;  Location: [facility];  Service: Ophthalmology;  Laterality: Left;  US  01:05 AP% 15.8 CDE 10.28 Fluid pak lot # [id number]   COLONOSCOPY     EYE SURGERY     Catara you   TOTAL LAPAROSCOPIC HYSTERECTOMY WITH BILATERAL SALPINGO OOPHORECTOMY N/A 07/27/2023   Procedure: TOTAL LAPAROSCOPIC HYSTERECTOMY WITH BILATERAL SALPINGO OOPHORECTOMY, SENTINEL LYMPH NODE INJECTION AND MAPPING, NODE DISSECTION;  Surgeon: Mancil Barter, MD;  Location: [facility];  Service: Gynecology;  Laterality: N/A;    Family History  Problem Relation Age of Onset   Diabetes Mother    Arthritis Father    Hypertension Father    Breast cancer Sister 44   Stroke Sister    Atrial fibrillation Sister    Cancer Brother    Heart disease Brother    Ovarian cancer Maternal Aunt    Colon cancer Maternal Aunt    Kidney cancer Neg Hx    Bladder Cancer Neg Hx     Allergies  Allergen Reactions   Nitrofurantoin  Diarrhea, Nausea Only and Other (See Comments)   Sulfa Antibiotics Nausea Only and Other (See Comments)       Latest Ref Rng & Units 10/12/2024    9:32 AM 07/09/2024    3:03 PM 06/06/2024    3:48 AM  CBC  WBC 4.0 - 10.5 K/uL 5.1  5.1  5.7   Hemoglobin 12.0 - 15.0 g/dL 88.2  88.0  87.9   Hematocrit 36.0 - 46.0 % 35.1  36.2  36.5   Platelets 150.0 - 400.0 K/uL 262.0  294.0  281       CMP     Component Value Date/Time   NA 140 10/19/2024 1139   NA 127 (L) 07/23/2020 1418   NA 136 04/10/2014 0449   K 3.9 10/19/2024  1139   K 3.1 (L) 04/10/2014 0449   CL 99 10/19/2024 1139   CL 97 (L) 04/10/2014 0449   CO2 30 10/19/2024 1139   CO2 33 (H) 04/10/2014 0449   GLUCOSE 81 10/19/2024 1139   GLUCOSE 175 (H) 04/10/2014 0449   BUN 21 10/19/2024 1139   BUN 25 07/23/2020 1418   BUN 17 04/10/2014 0449   CREATININE 1.09 10/19/2024 1139   CREATININE 0.85 04/10/2014 0449   CALCIUM 9.5 10/19/2024 1139   CALCIUM 8.8 04/10/2014 0449   PROT 6.1 10/12/2024 0932   PROT 7.6 04/10/2014 0449   ALBUMIN 4.0 10/12/2024 0932   ALBUMIN 3.7 04/10/2014 0449   AST 11 10/12/2024 0932   AST 25 04/10/2014 0449   ALT 9 10/12/2024 0932   ALT 26 04/10/2014 0449   ALKPHOS 119 (H) 10/12/2024 0932   ALKPHOS 100 04/10/2014 0449   BILITOT 0.3 10/12/2024 0932   BILITOT 0.4 04/10/2014 0449   GFR 45.38 (L) 10/19/2024 1139   GFRNONAA >60 06/06/2024 0348   GFRNONAA >60 04/10/2014 0449     VAS US  ABI WITH/WO TBI Result Date: 09/14/2024  LOWER EXTREMITY DOPPLER STUDY Patient Name:  Barbara Mooney  Date of Exam:   09/11/2024 Medical Rec #: [id number]          Accession #:    [id number] Date of Birth: January 22, 1934           Patient Gender: F Patient Age:   78 years Exam Location:  [facility] Procedure:      VAS US  ABI WITH/WO TBI Referring Phys: Selinda Gu --------------------------------------------------------------------------------  Indications: Decreased pulses felt at the level of the Ankle.  Comparison Study: 08/21/2020 Performing Technologist: Leafy Gibes RVS  Examination Guidelines: A complete evaluation includes at minimum, Doppler waveform signals and systolic blood pressure reading at the level of bilateral brachial, anterior tibial, and posterior tibial arteries, when vessel segments are accessible. Bilateral testing is considered an integral part of a complete examination. Photoelectric Plethysmograph (PPG) waveforms and toe systolic pressure readings are included as required and additional duplex testing as needed.  Limited examinations for reoccurring indications may be performed as noted.  ABI Findings: +---------+------------------+-----+---------+--------------+ Right    Rt Pressure (mmHg)IndexWaveform Comment        +---------+------------------+-----+---------+--------------+ Brachial                                 Restricted Arm +---------+------------------+-----+---------+--------------+ ATA      132               0.89 biphasic                +---------+------------------+-----+---------+--------------+ PTA      152               1.02 triphasic               +---------+------------------+-----+---------+--------------+ Great Toe95                0.64 Abnormal                +---------+------------------+-----+---------+--------------+ +---------+------------------+-----+---------+-------+ Left     Lt Pressure (mmHg)IndexWaveform Comment +---------+------------------+-----+---------+-------+ Brachial 149                                     +---------+------------------+-----+---------+-------+ ATA      134               0.90 biphasic         +---------+------------------+-----+---------+-------+ PTA      154               1.03 triphasic        +---------+------------------+-----+---------+-------+ PERO     155                    biphasic 1.04    +---------+------------------+-----+---------+-------+ Great Toe93                0.62 Abnormal         +---------+------------------+-----+---------+-------+ +-------+-----------+-----------+------------+------------+ ABI/TBIToday's ABIToday's TBIPrevious ABIPrevious TBI +-------+-----------+-----------+------------+------------+ Right  1.02       .64        1.19        .81          +-------+-----------+-----------+------------+------------+ Left   1.04       .62        .97         .69          +-------+-----------+-----------+------------+------------+  Bilateral ABIs appear essentially  unchanged compared to prior study on 08/21/2020. Bilateral TBIs appear decreased compared to prior study on 08/21/2020.  Summary: Right: Resting right ankle-brachial index is within normal range. The right toe-brachial index is abnormal.  Left: Resting left ankle-brachial index is within normal range. The left toe-brachial index is abnormal.  *See table(s) above for measurements and observations.  Electronically signed by Selinda Gu MD on 09/14/2024 at 9:39:25 AM.    Final        Assessment & Plan:   1. Lymphedema (Primary) Recommend:  No surgery or intervention at this point in time.   The Patient is CEAP [id number].  The patient has been wearing compression for more than 12 weeks with no or little benefit.  The patient has been exercising daily for more than 12 weeks. The patient has been elevating and taking OTC pain medications for more than 12 weeks.  None of these have have eliminated the pain related to the lymphedema or the discomfort regarding excessive swelling and venous congestion.    I have reviewed my discussion with the patient regarding lymphedema and why it  causes symptoms.  Patient will continue wearing graduated compression on a daily basis. The patient should put the compression on first thing in the morning and removing them in the evening. The patient should not sleep in the compression.   In addition, behavioral modification throughout the day will be continued.  This will include frequent elevation (such as in a recliner), use of over the counter pain medications as needed and exercise such as walking.  The systemic causes for chronic edema such as liver, kidney and cardiac etiologies do not appear to have significant changed over the past year.    The patient has chronic , severe lymphedema with hyperpigmentation of the skin and has done MLD, skin care, medication, diet, exercise, elevation and compression for 4 weeks with no improvement,  I am recommending a lymphedema  pump.  The patient still has stage 3 lymphedema and therefore, I believe that a lymph pump is needed to improve the control of the patient's lymphedema and improve the quality of life.  Additionally, a lymph pump is warranted because it will reduce the risk of cellulitis and ulceration in the future.  Patient should follow-up in six months   2. Essential hypertension Continue antihypertensive medications as already ordered, these medications have been reviewed and there are no changes at this time.  3. Type 2 diabetes mellitus with stage 3a chronic kidney disease, without long-term current use of insulin  (HCC) Continue hypoglycemic medications as already ordered, these medications have been reviewed and there are no changes at this time.  Hgb A1C to be monitored as already arranged by primary service   Current Outpatient Medications on File Prior to Visit  Medication Sig Dispense Refill   ACCU-CHEK GUIDE TEST test strip daily.     acetaminophen  (TYLENOL ) 500 MG tablet Take 1 tablet (500 mg total) by mouth 3 (three) times daily. 30 tablet 0   apixaban  (ELIQUIS ) 5 MG TABS tablet Take 5 mg by mouth once. Prescribed by Dr. Acquanetta     Blood Glucose Monitoring Suppl (ONE TOUCH ULTRA 2) w/Device KIT 1 EACH BY DOES NOT APPLY ROUTE DAILY. 1 kit 0   CRANBERRY PO Take 1 capsule by mouth 2 (two) times daily.      cyanocobalamin  (,VITAMIN B-12,) 1000 MCG/ML injection Inject 1,000 mcg into the muscle every 30 (thirty) days.      diltiazem  (CARDIZEM  CD) 180 MG 24 hr capsule Take 1 capsule (180 mg total) by mouth 2 (two) times daily. 180 capsule 3   docusate sodium  (COLACE) 100 MG capsule Take 1 capsule (100 mg total) by mouth 2 (two) times daily. 10 capsule 0   doxazosin  (CARDURA ) 1 MG tablet Take 1 tablet (1 mg total) by mouth 2 (two) times daily. 180 tablet 3   ezetimibe  (ZETIA ) 10 MG tablet Take 1 tablet (10 mg total) by mouth daily. 90 tablet 3   furosemide  (LASIX ) 20 MG tablet Take 1 tablet (  20 mg  total) by mouth daily as needed for fluid or edema (SOB). 90 tablet 3   LACTOBACILLUS PO Take 1 tablet by mouth daily at 6 (six) AM.     Lancet Device MISC Use to check blood sugars once daily. 1 each 0   Lancets Misc. MISC Use to check blood sugars once daily. 100 each 12   metFORMIN  (GLUCOPHAGE ) 500 MG tablet Take 1 tablet (500 mg total) by mouth 2 (two) times daily with a meal. 180 tablet 1   nystatin  cream (MYCOSTATIN ) Apply 1 Application topically 2 (two) times daily. 30 g 0   olmesartan  (BENICAR ) 40 MG tablet TAKE 1 TABLET BY MOUTH EVERY DAY 90 tablet 3   omeprazole  (PRILOSEC) 20 MG capsule Take 1 capsule (20 mg total) by mouth daily. 90 capsule 1   ondansetron  (ZOFRAN ) 8 MG tablet Take 1 tablet (8 mg total) by mouth 2 (two) times daily as needed for nausea or vomiting. 20 tablet 0   polyethylene glycol powder (GLYCOLAX /MIRALAX ) 17 GM/SCOOP powder Take 17 g by mouth daily as needed for mild constipation. 238 g 0   Trolamine Salicylate (BLUE-EMU HEMP EX) Apply 1 application topically daily as needed (Knee pain).     No current facility-administered medications on file prior to visit.    There are no Patient Instructions on file for this visit. No follow-ups on file.   Allister Lessley E Joseantonio Dittmar, NP

## 2024-11-10 ENCOUNTER — Other Ambulatory Visit: Payer: Self-pay | Admitting: Cardiovascular Disease

## 2024-11-10 DIAGNOSIS — I480 Paroxysmal atrial fibrillation: Secondary | ICD-10-CM

## 2024-11-13 ENCOUNTER — Encounter: Payer: Self-pay | Admitting: Cardiovascular Disease

## 2024-11-13 NOTE — Telephone Encounter (Signed)
 Prescription refill request for Eliquis  received. Indication:Afib Last office visit:11/25 Scr: 1.09  11/25 Age:89 Weight:76.8  kg  Prescription refilled

## 2024-11-15 MED ORDER — DOXAZOSIN MESYLATE 1 MG PO TABS: ORAL_TABLET | ORAL | 3 refills | Status: DC

## 2024-11-23 ENCOUNTER — Ambulatory Visit

## 2024-11-23 DIAGNOSIS — Z0000 Encounter for general adult medical examination without abnormal findings: Secondary | ICD-10-CM

## 2024-11-23 NOTE — Patient Instructions (Signed)
 Barbara Mooney,  Thank you for taking the time for your Medicare Wellness Visit. I appreciate your continued commitment to your health goals. Please review the care plan we discussed, and feel free to reach out if I can assist you further.  Please note that Annual Wellness Visits do not include a physical exam. Some assessments may be limited, especially if the visit was conducted virtually. If needed, we may recommend an in-person follow-up with your provider.  Ongoing Care Seeing your primary care provider every 3 to 6 months helps us  monitor your health and provide consistent, personalized care. 01/31/25 @ 1:30  Referrals If a referral was made during today's visit and you haven't received any updates within two weeks, please contact the referred provider directly to check on the status.  Recommended Screenings:  Health Maintenance  Topic Date Due   Medicare Annual Wellness Visit  Never done   Osteoporosis screening with Bone Density Scan  Never done   COVID-19 Vaccine (6 - 2025-26 season) 08/13/2024   Eye exam for diabetics  02/08/2025   Hemoglobin A1C  04/11/2025   Complete foot exam   10/19/2025   DTaP/Tdap/Td vaccine (2 - Td or Tdap) 03/17/2026   Pneumococcal Vaccine for age over 50  Completed   Flu Shot  Completed   Zoster (Shingles) Vaccine  Completed   Meningitis B Vaccine  Aged Out   Breast Cancer Screening  Discontinued       11/23/2024    9:33 AM  Advanced Directives  Does Patient Have a Medical Advance Directive? Yes  Type of Estate Agent of [place];Living will  Does patient want to make changes to medical advance directive? No - Guardian declined  Copy of Healthcare Power of Attorney in Chart? Yes - validated most recent copy scanned in chart (See row information)    Vision: Annual vision screenings are recommended for early detection of glaucoma, cataracts, and diabetic retinopathy. These exams can also reveal signs of chronic conditions such  as diabetes and high blood pressure.  Dental: Annual dental screenings help detect early signs of oral cancer, gum disease, and other conditions linked to overall health, including heart disease and diabetes.  Please see the attached documents for additional preventive care recommendations.

## 2024-11-23 NOTE — Progress Notes (Signed)
 Chief Complaint  Patient presents with   Medicare Wellness     Subjective:   Barbara  B Mooney is a 89 y.o. female who presents for a Medicare Annual Wellness Visit.  Visit info / Clinical Intake: Medicare Wellness Visit Type:: Subsequent Annual Wellness Visit Persons participating in visit and providing information:: patient & caregiver Medicare Wellness Visit Mode:: Video Since this visit was completed virtually, some vitals may be partially provided or unavailable. Missing vitals are due to the limitations of the virtual format.: Unable to obtain vitals - no equipment If Telephone or Video please confirm:: I connected with patient using audio/video enable telemedicine. I verified patient identity with two identifiers, discussed telehealth limitations, and patient agreed to proceed. Patient Location:: home Provider Location:: home Interpreter Needed?: No Living arrangements:: (!) lives alone Patient's Overall Health Status Rating: good Typical amount of pain: some Does pain affect daily life?: no (arthritis in knees) Are you currently prescribed opioids?: no  Dietary Habits and Nutritional Risks How many meals a day?: 3 Eats fruit and vegetables daily?: yes Most meals are obtained by: preparing own meals; having others provide food In the last 2 weeks, have you had any of the following?: none Diabetic:: (!) yes Any non-healing wounds?: no How often do you check your BS?: as needed Would you like to be referred to a Nutritionist or for Diabetic Management? : no  Functional Status Activities of Daily Living (to include ambulation/medication): Independent Ambulation: Independent Home Assistive Devices/Equipment: Cane Medication Administration: Independent Home Management (perform basic housework or laundry): Independent Manage your own finances?: yes Primary transportation is: driving Concerns about vision?: no *vision screening is required for WTM* Concerns about  hearing?: no  Fall Screening Falls in the past year?: 1 Number of falls in past year: 0 Was there an injury with Fall?: 1 Fall Risk Category Calculator: 2 Patient Fall Risk Level: Moderate Fall Risk  Fall Risk Patient at Risk for Falls Due to: History of fall(s); Impaired balance/gait Fall risk Follow up: Falls evaluation completed; Education provided; Falls prevention discussed  Home and Transportation Safety: All rugs have non-skid backing?: N/A, no rugs All stairs or steps have railings?: N/A, no stairs Grab bars in the bathtub or shower?: yes Have non-skid surface in bathtub or shower?: yes Good home lighting?: yes Regular seat belt use?: yes Hospital stays in the last year:: (!) yes How many hospital stays:: 1 Reason: Fall  Cognitive Assessment Difficulty concentrating, remembering, or making decisions? : no Will 6CIT or Mini Cog be Completed: yes What year is it?: 0 points What month is it?: 0 points Give patient an address phrase to remember (5 components): [address] [facility] [place] About what time is it?: 0 points Count backwards from 20 to 1: 0 points Say the months of the year in reverse: 0 points Repeat the address phrase from earlier: 0 points 6 CIT Score: 0 points  Advance Directives (For Healthcare) Does Patient Have a Medical Advance Directive?: Yes Does patient want to make changes to medical advance directive?: No - Guardian declined Type of Advance Directive: Healthcare Power of [place]; Living will Copy of Healthcare Power of Attorney in Chart?: Yes - validated most recent copy scanned in chart (See row information) Copy of Living Will in Chart?: Yes - validated most recent copy scanned in chart (See row information)  Reviewed/Updated  Reviewed/Updated: Reviewed All (Medical, Surgical, Family, Medications, Allergies, Care Teams, Patient Goals)    Allergies (verified) Nitrofurantoin  and Sulfa antibiotics   Current Medications (  verified) Outpatient  Encounter Medications as of 11/23/2024  Medication Sig   ACCU-CHEK GUIDE TEST test strip daily.   acetaminophen  (TYLENOL ) 500 MG tablet Take 1 tablet (500 mg total) by mouth 3 (three) times daily.   Blood Glucose Monitoring Suppl (ONE TOUCH ULTRA 2) w/Device KIT 1 EACH BY DOES NOT APPLY ROUTE DAILY.   CRANBERRY PO Take 1 capsule by mouth 2 (two) times daily.    cyanocobalamin  (,VITAMIN B-12,) 1000 MCG/ML injection Inject 1,000 mcg into the muscle every 30 (thirty) days.    diltiazem  (CARDIZEM  CD) 180 MG 24 hr capsule Take 1 capsule (180 mg total) by mouth 2 (two) times daily.   docusate sodium  (COLACE) 100 MG capsule Take 1 capsule (100 mg total) by mouth 2 (two) times daily.   doxazosin  (CARDURA ) 1 MG tablet Please take 2 MG ( 2 tablets) in the AM and 1 MG ( 1 tablet) in the evening.   ELIQUIS  5 MG TABS tablet TAKE 1 TABLET BY MOUTH TWICE A DAY   furosemide  (LASIX ) 20 MG tablet Take 1 tablet (20 mg total) by mouth daily as needed for fluid or edema (SOB).   LACTOBACILLUS PO Take 1 tablet by mouth daily at 6 (six) AM.   Lancet Device MISC Use to check blood sugars once daily.   Lancets Misc. MISC Use to check blood sugars once daily.   metFORMIN  (GLUCOPHAGE ) 500 MG tablet Take 1 tablet (500 mg total) by mouth 2 (two) times daily with a meal.   olmesartan  (BENICAR ) 40 MG tablet TAKE 1 TABLET BY MOUTH EVERY DAY   omeprazole  (PRILOSEC) 20 MG capsule Take 1 capsule (20 mg total) by mouth daily.   ondansetron  (ZOFRAN ) 8 MG tablet Take 1 tablet (8 mg total) by mouth 2 (two) times daily as needed for nausea or vomiting.   polyethylene glycol powder (GLYCOLAX /MIRALAX ) 17 GM/SCOOP powder Take 17 g by mouth daily as needed for mild constipation.   Trolamine Salicylate (BLUE-EMU HEMP EX) Apply 1 application topically daily as needed (Knee pain).   ezetimibe  (ZETIA ) 10 MG tablet Take 1 tablet (10 mg total) by mouth daily. (Patient not taking: Reported on 11/23/2024)   nystatin  cream (MYCOSTATIN ) Apply 1  Application topically 2 (two) times daily. (Patient not taking: Reported on 11/23/2024)   No facility-administered encounter medications on file as of 11/23/2024.    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   a.) stage 0 (high grade cTis (DCIS), cN0, cM0) with comedonecrosis -- > s/p lumpectomy + XRT   Endometrial cancer (HCC) 05/25/2023   a.) pathology (+) for well differentiated endometroid carcinoma (FIGO 1)   Follicular non-Hodgkin's lymphoma (HCC) 2018   a.) 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 of the RIGHT proximal tibia s/p XRT   GERD (gastroesophageal reflux disease)    History of hiatal hernia    HLD (hyperlipidemia)    HOH (hard of hearing)    Hypertension    Hypothyroidism    Mixed incontinence    MRSA infection    a.) remote history; posterior torso   Multiple thyroid  nodules    Peripheral edema  Permanent atrial fibrillation (HCC) 10/2016   a.) CHA2DS2VASc = 6 (age x2, sex, HTN, vascular disease history, T2DM);  b.) s/p DCCV 01/20/2017 (150 J x 1) --> failed; c.) rate/rhythm maintained on oral diltiazem ; chronically anticoagulated with apixaban    RBBB (right bundle branch block)    Recurrent UTI (urinary tract infection)    Unsteady gait    Varicose veins of both lower extremities    Vitamin B12 deficiency    Past Surgical History:  Procedure Laterality Date   ABDOMINAL HYSTERECTOMY  2024   BONE BIOPSY Right 2018   tibia   BREAST BIOPSY Right 02/26/2021   ffirm bx-X clip-HIGH-GRADE DUCTAL CARCINOMA IN SITU   BREAST LUMPECTOMY Right 03/09/2021   Procedure: BREAST LUMPECTOMY;  Surgeon: Dessa Reyes ORN, MD;  Location: [facility];  Service: General;   Laterality: Right;   CARDIOVERSION N/A 01/20/2017   Procedure: CARDIOVERSION;  Surgeon: Evalene JINNY Lunger, MD;  Location: [facility];  Service: Cardiovascular;  Laterality: N/A;   CATARACT EXTRACTION W/PHACO Right 02/15/2018   Procedure: CATARACT EXTRACTION PHACO AND INTRAOCULAR LENS PLACEMENT (IOC);  Surgeon: Jaye Fallow, MD;  Location: [facility];  Service: Ophthalmology;  Laterality: Right;  US  01:22.8 AP% 18.9 CDE 15.62 Fluid Pack Lot # [id number]   CATARACT EXTRACTION W/PHACO Left 03/08/2018   Procedure: CATARACT EXTRACTION PHACO AND INTRAOCULAR LENS PLACEMENT (IOC);  Surgeon: Jaye Fallow, MD;  Location: [facility];  Service: Ophthalmology;  Laterality: Left;  US  01:05 AP% 15.8 CDE 10.28 Fluid pak lot # [id number]   COLONOSCOPY     EYE SURGERY     Catara you   TOTAL LAPAROSCOPIC HYSTERECTOMY WITH BILATERAL SALPINGO OOPHORECTOMY N/A 07/27/2023   Procedure: TOTAL LAPAROSCOPIC HYSTERECTOMY WITH BILATERAL SALPINGO OOPHORECTOMY, SENTINEL LYMPH NODE INJECTION AND MAPPING, NODE DISSECTION;  Surgeon: Mancil Barter, MD;  Location: [facility];  Service: Gynecology;  Laterality: N/A;   Family History  Problem Relation Age of Onset   Diabetes Mother    Arthritis Father    Hypertension Father    Breast cancer Sister 36   Stroke Sister    Atrial fibrillation Sister    Cancer Brother    Heart disease Brother    Ovarian cancer Maternal Aunt    Colon cancer Maternal Aunt    Kidney cancer Neg Hx    Bladder Cancer Neg Hx    Social History   Occupational History   Not on file  Tobacco Use   Smoking status: Never   Smokeless tobacco: Never  Vaping Use   Vaping status: Never Used  Substance and Sexual Activity   Alcohol use: Never   Drug use: Never   Sexual activity: Not Currently   Tobacco Counseling Counseling given: Not Answered  SDOH Screenings   Food Insecurity: No Food Insecurity (11/23/2024)  Housing: Unknown (11/23/2024)  Transportation Needs: No Transportation Needs  (11/23/2024)  Utilities: At Risk (11/23/2024)  Depression (PHQ2-9): Low Risk (11/23/2024)  Financial Resource Strain: Low Risk (11/22/2024)  Physical Activity: Inactive (11/23/2024)  Social Connections: Moderately Integrated (11/23/2024)  Stress: No Stress Concern Present (11/23/2024)  Tobacco Use: Low Risk (11/04/2024)  Health Literacy: Adequate Health Literacy (11/23/2024)   See flowsheets for full screening details  Depression Screen PHQ 2 & 9 Depression Scale- Over the past 2 weeks, how often have you been bothered by any of the following problems? Little interest or pleasure in doing things: 0 Feeling down, depressed, or hopeless (PHQ Adolescent also includes...irritable): 0 PHQ-2 Total Score: 0 Trouble falling or staying asleep, or sleeping too much: 0  Feeling tired or having little energy: 0 Poor appetite or overeating (PHQ Adolescent also includes...weight loss): 0 Feeling bad about yourself - or that you are a failure or have let yourself or your family down: 0 Trouble concentrating on things, such as reading the newspaper or watching television (PHQ Adolescent also includes...like school work): 0 Moving or speaking so slowly that other people could have noticed. Or the opposite - being so fidgety or restless that you have been moving around a lot more than usual: 0 Thoughts that you would be better off dead, or of hurting yourself in some way: 0 PHQ-9 Total Score: 0 If you checked off any problems, how difficult have these problems made it for you to do your work, take care of things at home, or get along with other people?: Not difficult at all     Goals Addressed             This Visit's Progress    Working on balance.               Objective:    There were no vitals filed for this visit. There is no height or weight on file to calculate BMI.  Hearing/Vision screen No results found. Immunizations and Health Maintenance Health Maintenance  Topic Date Due    Medicare Annual Wellness (AWV)  Never done   Bone Density Scan  Never done   COVID-19 Vaccine (6 - 2025-26 season) 12/09/2024 (Originally 08/13/2024)   OPHTHALMOLOGY EXAM  02/08/2025   HEMOGLOBIN A1C  04/11/2025   FOOT EXAM  10/19/2025   DTaP/Tdap/Td (2 - Td or Tdap) 03/17/2026   Pneumococcal Vaccine: 50+ Years  Completed   Influenza Vaccine  Completed   Zoster Vaccines- Shingrix  Completed   Meningococcal B Vaccine  Aged Out   Mammogram  Discontinued        Assessment/Plan:  This is a routine wellness examination for Julie-Ann .  Patient Care Team: Glendia Shad, MD as PCP - General (Internal Medicine) Perla Evalene PARAS, MD as PCP - Cardiology (Cardiology) Perla Evalene PARAS, MD as Consulting Physician (Cardiology) Cindie Jesusa HERO, RN as Oncology Nurse Navigator Maurie Rayfield BIRCH, RN as Oncology Nurse Navigator Lenn Aran, MD as Consulting Physician (Radiation Oncology)  I have personally reviewed and noted the following in the patients chart:   Medical and social history Use of alcohol, tobacco or illicit drugs  Current medications and supplements including opioid prescriptions. Functional ability and status Nutritional status Physical activity Advanced directives List of other physicians Hospitalizations, surgeries, and ER visits in previous 12 months Vitals Screenings to include cognitive, depression, and falls Referrals and appointments  No orders of the defined types were placed in this encounter.  In addition, I have reviewed and discussed with patient certain preventive protocols, quality metrics, and best practice recommendations. A written personalized care plan for preventive services as well as general preventive health recommendations were provided to patient.   Arnette LOISE Hoots, CMA   11/23/2024   No follow-ups on file.  After Visit Summary: (Declined) Due to this being a telephonic visit, with patients personalized plan was offered to patient  but patient Declined AVS at this time   Nurse Notes: Patient is working on strengthening to ger her knees more stable as she is a little wobbly on her feet. She declines covid vaccines. She is UTD on all other HM. She has not started zetia  yet, waiting for BP to stabilize.

## 2024-11-28 ENCOUNTER — Encounter: Payer: Self-pay | Admitting: Obstetrics and Gynecology

## 2024-11-28 ENCOUNTER — Other Ambulatory Visit

## 2024-11-28 ENCOUNTER — Ambulatory Visit

## 2024-11-28 ENCOUNTER — Ambulatory Visit
Admission: RE | Admit: 2024-11-28 | Discharge: 2024-11-28 | Disposition: A | Discharge status: Home / Self Care | Source: Ambulatory Visit | Attending: Radiation Oncology | Admitting: Radiation Oncology

## 2024-11-28 ENCOUNTER — Other Ambulatory Visit: Payer: Self-pay

## 2024-11-28 ENCOUNTER — Other Ambulatory Visit: Payer: Self-pay | Admitting: Internal Medicine

## 2024-11-28 ENCOUNTER — Inpatient Hospital Stay: Attending: Radiation Oncology | Admitting: Obstetrics and Gynecology

## 2024-11-28 VITALS — BP 160/82 | HR 88 | Temp 98.6°F | Resp 20 | Wt 175.4 lb

## 2024-11-28 DIAGNOSIS — Z8542 Personal history of malignant neoplasm of other parts of uterus: Secondary | ICD-10-CM | POA: Insufficient documentation

## 2024-11-28 DIAGNOSIS — D649 Anemia, unspecified: Secondary | ICD-10-CM

## 2024-11-28 DIAGNOSIS — D0511 Intraductal carcinoma in situ of right breast: Secondary | ICD-10-CM

## 2024-11-28 DIAGNOSIS — E538 Deficiency of other specified B group vitamins: Secondary | ICD-10-CM | POA: Diagnosis not present

## 2024-11-28 DIAGNOSIS — Z86000 Personal history of in-situ neoplasm of breast: Secondary | ICD-10-CM | POA: Insufficient documentation

## 2024-11-28 DIAGNOSIS — E1122 Type 2 diabetes mellitus with diabetic chronic kidney disease: Secondary | ICD-10-CM

## 2024-11-28 DIAGNOSIS — Z90710 Acquired absence of both cervix and uterus: Secondary | ICD-10-CM | POA: Diagnosis not present

## 2024-11-28 DIAGNOSIS — Z08 Encounter for follow-up examination after completed treatment for malignant neoplasm: Secondary | ICD-10-CM

## 2024-11-28 DIAGNOSIS — Z923 Personal history of irradiation: Secondary | ICD-10-CM | POA: Insufficient documentation

## 2024-11-28 DIAGNOSIS — Z90722 Acquired absence of ovaries, bilateral: Secondary | ICD-10-CM | POA: Diagnosis not present

## 2024-11-28 DIAGNOSIS — I10 Essential (primary) hypertension: Secondary | ICD-10-CM

## 2024-11-28 DIAGNOSIS — E782 Mixed hyperlipidemia: Secondary | ICD-10-CM

## 2024-11-28 DIAGNOSIS — C541 Malignant neoplasm of endometrium: Secondary | ICD-10-CM

## 2024-11-28 LAB — IBC + FERRITIN
Ferritin: 23.1 ng/mL (ref 10.0–291.0)
Iron: 77 ug/dL (ref 42–145)
Saturation Ratios: 23.4 % (ref 20.0–50.0)
TIBC: 329 ug/dL (ref 250.0–450.0)
Transferrin: 235 mg/dL (ref 212.0–360.0)

## 2024-11-28 LAB — CBC WITH DIFFERENTIAL/PLATELET
Basophils Absolute: 0 K/uL (ref 0.0–0.1)
Basophils Relative: 0.6 % (ref 0.0–3.0)
Eosinophils Absolute: 0.1 K/uL (ref 0.0–0.7)
Eosinophils Relative: 3.1 % (ref 0.0–5.0)
HCT: 33.1 % — ABNORMAL LOW (ref 36.0–46.0)
Hemoglobin: 11 g/dL — ABNORMAL LOW (ref 12.0–15.0)
Lymphocytes Relative: 11.6 % — ABNORMAL LOW (ref 12.0–46.0)
Lymphs Abs: 0.5 K/uL — ABNORMAL LOW (ref 0.7–4.0)
MCHC: 33.3 g/dL (ref 30.0–36.0)
MCV: 91.9 fl (ref 78.0–100.0)
Monocytes Absolute: 0.4 K/uL (ref 0.1–1.0)
Monocytes Relative: 9.3 % (ref 3.0–12.0)
Neutro Abs: 2.9 K/uL (ref 1.4–7.7)
Neutrophils Relative %: 75.4 % (ref 43.0–77.0)
Platelets: 227 K/uL (ref 150.0–400.0)
RBC: 3.6 Mil/uL — ABNORMAL LOW (ref 3.87–5.11)
RDW: 16.8 % — ABNORMAL HIGH (ref 11.5–15.5)
WBC: 3.9 K/uL — ABNORMAL LOW (ref 4.0–10.5)

## 2024-11-28 MED ORDER — CYANOCOBALAMIN 1000 MCG/ML IJ SOLN
1000.0000 ug | Freq: Once | INTRAMUSCULAR | Status: AC
  Administered 2024-11-28: 09:00:00 1000 ug via INTRAMUSCULAR

## 2024-11-28 NOTE — Progress Notes (Signed)
 Radiation Oncology Follow up Note  Name: Barbara Mooney   Date:   11/28/2024 MRN:  [id number] DOB: September 13, 1934    This 89 y.o. 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  REFERRING PROVIDER: Marikay Eva POUR, PA  HPI: Patient is an 89 year old female now out 3 years having received whole breast radiation therapy as well as 1 year out from pelvic radiation therapy and vaginal brachytherapy for stage Ib grade 2 adenocarcinoma with 8 of 10 mm of invasion and LVSI and cervical stromal involvement..  Present time she is doing well.  She specifically denies any breast discomfort.  She has no problems with increased lower urinary tract symptoms diarrhea or any pelvic pain.  She was seen by Dr. Mancil today underwent pelvic examination showing no evidence of disease.  She had diagnostic mammograms last month which I have reviewed were BI-RADS Category 2 benign.  COMPLICATIONS OF TREATMENT: none  FOLLOW UP COMPLIANCE: keeps appointments   PHYSICAL EXAM:  There were no vitals taken for this visit.  Lungs are clear to A&P cardiac examination essentially unremarkable with regular rate and rhythm. No dominant mass or nodularity is noted in either breast in 2 positions examined. Incision is well-healed. No axillary or supraclavicular adenopathy is appreciated. Cosmetic result is excellent. Well-developed well-nourished patient in NAD. HEENT reveals PERLA, EOMI, discs not visualized.  Oral cavity is clear. No oral mucosal lesions are identified. Neck is clear without evidence of cervical or supraclavicular adenopathy. Lungs are clear to A&P. Cardiac examination is essentially unremarkable with regular rate and rhythm without murmur rub or thrill. Abdomen is benign with no organomegaly  or masses noted. Motor sensory and DTR levels are equal and symmetric in the upper and lower extremities. Cranial nerves II through XII are grossly intact. Proprioception is intact. No peripheral adenopathy or edema is identified. No motor or sensory levels are noted. Crude visual fields are within normal range.  RADIOLOGY RESULTS: Mammograms and ultrasound reviewed  PLAN: Present time patient is doing well from both an endometrial as well as breast cancer standpoint with no evidence of disease.  I am pleased with her overall progress.  She continues follow-up care with GYN oncology.  I am good to turn follow-up care over to them.  I be happy to reevaluate the patient anytime should that be indicated.  Patient knows to call with any concerns.  I would like to take this opportunity to thank you for allowing me to participate in the care of your patient.SABRA Marcey Penton, MD

## 2024-11-28 NOTE — Progress Notes (Signed)
 Patient was administered a B12 injection into her left deltoid. Patient tolerated the B12 injection well.

## 2024-11-28 NOTE — Addendum Note (Signed)
 Addended by: EMERY PRESIDENT D on: 11/28/2024 09:17 AM   Modules accepted: Orders

## 2024-11-28 NOTE — Progress Notes (Addendum)
 Gynecologic Oncology Consult Visit   Referring Provider: Dr. Verdon  Chief Concern: Endometrial cancer, surveillance visit  Subjective:  Barbara Mooney is a 89 y.o. female who is seen in consultation from Dr. Verdon for grade 2 endometrial cancer, s/p TLH, BSO, SLN mapping and biopsies on 07/27/23 . Pathology showed Stage IB grade 2 adenocarcinoma with 8/10 mm invasion with LVSI and cervical stromal involvement. Bilateral SLNs with ITCs. Washings negative. Received adjuvant pelvic RT and vaginal brachy completed 10/28/2023.   No new complaints.   Gyn Oncology History Patient experienced PMB which prompted ultrasound.   - Uterus: 8.39 x 4.41 x 6.02 cm  - Ovaries: bilaterally wnl  - Other: 27 mm endometrial lining, 2.8 cm posterior fibroid. The uterus had a maximal endometrial thickness of 27 mm. The endometrial contour appeared irregular and generally thickened. Endometrial findings were unable to be fully examined due to the large, solid mass .   05/25/23- Pap NILM  She underwent endometrial biopsy.  WELL DIFFERENTIATED ENDOMETRIOID CARCINOMA (FIGO I).  - Loss of MLH1 and PMS2. MSH2 and MSH6 intact.    She had possible rectal prolapse on exam and was referred to GI. She takes Eliquis  for history of a fib.   She has a history of Breast DCIS and Non Hodgkins Lymphoma.   # Right lower inner biopsy- high grade DCIS, lobular neoplasia, calcifications. She underwent lumpectomy with high grade dcis, PLCIS, lobular neoplasia. Followed by radiation x 3 weeks with boost.    # Right LE Non-hodgkin follicular lymphoma s/p surgery at [facility] and RT with Dr Lenn in 2018.  She has had radiation to the right lower extremity.  Genetic Testing: 08/26/21- Inv[facility]ae Multi-cancer 84 gene panel was negative.   07/27/23 TLH, BSO, SLN mapping and biopsies. Pathology showed 8/10 mm invasion with LVSI and cervical stromal involvement. Bilateral SLNs with ITCs.  Washings negative.   No new complaints today.   Incisions healing slowly.   Diagnosis 1. Lymph node, sentinel, biopsy, Left external iliac - ONE LYMPH NODE WITH ISOLATED TUMOR CELLS. - PANCYTOKERATIN STAIN CONFIRMS. 2. Lymph node, sentinel, biopsy, Right external iliac - ONE LYMPH NODE WITH ISOLATED TUMOR CELLS. - PANCYTOKERATIN STAIN CONFIRMS. 3. Uterus +/- tubes/ovaries, neoplastic - ENDOMETRIOID CARCINOMA WITH MICROCYSTIC, ELONGATED AND FRAGMENTED (MELF) PATTERN OF INVASION. - CARCINOMA INVADES STROMAL CONNECTIVE TISSUE OF CERVIX. - SEE CANCER SUMMARY BELOW. - BACKGROUND ATROPHIC ENDOMETRIUM WITH ATYPICAL ENDOMETRIAL HYPERPLASIA/EIN. - MYOMETRIUM WITH LEIOMYOMATA, LARGEST MEASURING 1.2 CM. - RIGHT PARATUBAL CYST. - BILATERAL FALLOPIAN TUBES WITH FIMBRIATED END. - BILATERAL OVARIES WITH NO PATHOLOGIC ABNORMALITY. Microscopic Comment 3. CASE SUMMARY: (ENDOMETRIUM) Standard(s): AJCC-UICC 8, FIGO Cancer Report 2023 SPECIMEN Procedure: Total hysterectomy and bilateral salpingo-oophorectomy TUMOR Histologic Type: Endometrioid carcinoma, NOS Histologic Grade: FIGO grade 2 Myometrial Invasion: Present Depth of invasion (millimeters): 8 mm Myometrial thickness (millimeters): 10 mm Percentage of myometrial invasion: 80%   Microscopic Comment(continued) Uterine Serosa Involvement: Not identified Lower Uterine Segment Involvement: Present, myoinvasive Cervical Stromal Involvement: Present Other Tissue/Organ Involvement: Not applicable Lymphatic and/or Vascular Invasion: Present  MLH1: LOSS OF NUCLEAR EXPRESSION MSH2: Preserved nuclear expression MSH6: LOSS OF NUCLEAR EXPRESSION PMS2: LOSS OF NUCLEAR EXPRESSION Methylated MLH1 alleles present. TP53 WT  9/22 had negative germline genetic testing.  Personal and family history of malignancy.  Negative Inv[facility]ae 84 gene germline extended panel test 8/24.   Last colonoscopy in 05/2014- results not available Mammogram 10/24/24- diagnostic right mammogram was benign. Bilateral  mammogram scheduled 03/06/25.   Problem List: Patient Active Problem List   Diagnosis  Date Noted   Breast pain 10/19/2024   Elevated alkaline phosphatase level 10/19/2024   Dysuria 08/25/2024   Vaginal burning 08/25/2024   Absent foot pulse 07/14/2024   Back pain 06/04/2024   Intractable back pain 06/03/2024   DNR (do not resuscitate) 06/03/2024   Abnormal urine odor 05/27/2024   Vitamin D  deficiency 04/25/2024   Spinal stenosis 04/25/2024   Dizziness 04/24/2024   Low back pain 04/10/2024   Endometrial cancer, grade I (HCC) 07/27/2023   Counseling and coordination of care 08/26/2021   Family history of breast cancer 07/21/2021   Family history of ovarian cancer 07/21/2021   Family history of colon cancer 07/21/2021   Family history of Hodgkin's disease 07/21/2021   Hyperlipemia, mixed 07/21/2021   Osteopenia 07/21/2021   Acquired trigger finger 06/11/2021   Ductal carcinoma in situ (DCIS) of right breast 03/05/2021   Bradycardia 02/01/2019   Closed Colles' fracture 09/23/2018   Follicular non-Hodgkin's lymphoma of bone (HCC) 11/16/2017   Tibial mass 10/13/2017   Malignant neoplasm of right tibia (HCC) 10/07/2017   Complete tear of right rotator cuff 05/16/2017   Rotator cuff tendinitis, right 05/16/2017   Encounter for anticoagulation discussion and counseling 02/08/2017   Leg swelling 02/08/2017   Non-Hodgkin lymphoma (HCC) 2018   Unsteady gait    Anemia    Paroxysmal atrial fibrillation (HCC)    Essential hypertension    Degenerative tear of lateral meniscus, left 08/09/2016   Primary osteoarthritis of left knee 08/09/2016   Varicose veins of both legs with edema 03/17/2016   Type 2 diabetes mellitus with stage 3 chronic kidney disease, without long-term current use of insulin  (HCC) 01/27/2016   Mixed incontinence 06/11/2015   External hemorrhoid 03/11/2015   Gastroesophageal reflux disease without esophagitis 03/11/2015   Onychomycosis of toenail 03/11/2015   Knee  strain, right, subsequent encounter 02/28/2015   Primary osteoarthritis of right knee 02/28/2015   Vitamin B 12 deficiency 06/13/2014   Multinodular goiter (nontoxic) 09/07/2011    Past Medical 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   a.) stage 0 (high grade cTis (DCIS), cN0, cM0) with comedonecrosis -- > s/p lumpectomy + XRT   Endometrial cancer (HCC) 05/25/2023   a.) pathology (+) for well differentiated endometroid carcinoma (FIGO 1)   Follicular non-Hodgkin's lymphoma (HCC) 2018   a.) 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 of the RIGHT proximal tibia s/p XRT   GERD (gastroesophageal reflux disease)    History of hiatal hernia    HLD (hyperlipidemia)    HOH (hard of hearing)    Hypertension    Hypothyroidism    Mixed incontinence    MRSA infection    a.) remote history; posterior torso   Multiple thyroid  nodules    Peripheral edema    Permanent atrial fibrillation (HCC) 10/2016   a.) CHA2DS2VASc = 6 (age x2, sex, HTN, vascular disease history, T2DM);  b.) s/p DCCV 01/20/2017 (150 J x 1) --> failed; c.) rate/rhythm maintained on oral diltiazem ; chronically anticoagulated with apixaban    RBBB (right bundle branch block)    Recurrent UTI (urinary tract infection)    Unsteady gait  Varicose veins of both lower extremities    Vitamin B12 deficiency     Past Surgical History: Past Surgical History:  Procedure Laterality Date   ABDOMINAL HYSTERECTOMY  2024   BONE BIOPSY Right 2018   tibia   BREAST BIOPSY Right 02/26/2021   ffirm bx-X clip-HIGH-GRADE DUCTAL CARCINOMA IN SITU   BREAST LUMPECTOMY Right 03/09/2021   Procedure: BREAST LUMPECTOMY;  Surgeon: Dessa Reyes ORN, MD;  Location: [facility];  Service: General;  Laterality: Right;   CARDIOVERSION N/A 01/20/2017   Procedure: CARDIOVERSION;  Surgeon: Evalene JINNY Lunger, MD;  Location: [facility];  Service: Cardiovascular;  Laterality: N/A;   CATARACT EXTRACTION W/PHACO Right 02/15/2018   Procedure: CATARACT EXTRACTION PHACO AND INTRAOCULAR LENS PLACEMENT (IOC);  Surgeon: Jaye Fallow, MD;  Location: [facility];  Service: Ophthalmology;  Laterality: Right;  US  01:22.8 AP% 18.9 CDE 15.62 Fluid Pack Lot # [id number]   CATARACT EXTRACTION W/PHACO Left 03/08/2018   Procedure: CATARACT EXTRACTION PHACO AND INTRAOCULAR LENS PLACEMENT (IOC);  Surgeon: Jaye Fallow, MD;  Location: [facility];  Service: Ophthalmology;  Laterality: Left;  US  01:05 AP% 15.8 CDE 10.28 Fluid pak lot # [id number]   COLONOSCOPY     EYE SURGERY     Catara you   TOTAL LAPAROSCOPIC HYSTERECTOMY WITH BILATERAL SALPINGO OOPHORECTOMY N/A 07/27/2023   Procedure: TOTAL LAPAROSCOPIC HYSTERECTOMY WITH BILATERAL SALPINGO OOPHORECTOMY, SENTINEL LYMPH NODE INJECTION AND MAPPING, NODE DISSECTION;  Surgeon: Mancil Barter, MD;  Location: [facility];  Service: Gynecology;  Laterality: N/A;    Past Gynecologic History:  Per HPI  OB History:  OB History  Gravida Para Term Preterm AB Living  2     2  SAB IAB Ectopic Multiple Live Births          # Outcome Date GA Lbr Len/2nd Weight Sex Type Anes PTL Lv  2 Gravida           1 Gravida             Family History: Family History  Problem Relation Age of Onset   Diabetes Mother    Arthritis Father    Hypertension Father    Breast cancer Sister 3   Stroke Sister    Atrial fibrillation Sister    Cancer Brother    Heart disease Brother    Ovarian cancer Maternal Aunt    Colon cancer Maternal Aunt    Kidney cancer Neg Hx    Bladder Cancer Neg Hx     Social History: Social History   Socioeconomic History   Marital status: Widowed    Spouse name: Not on file   Number of  children: Not on file   Years of education: Not on file   Highest education level: 12th grade  Occupational History   Not on file  Tobacco Use   Smoking status: Never   Smokeless tobacco: Never  Vaping Use   Vaping status: Never Used  Substance and Sexual Activity   Alcohol use: Never   Drug use: Never   Sexual activity: Not Currently  Other Topics Concern   Not on file  Social History Narrative   Lives by self; son lives about 2 miles. Never smoked; no alcohol. Used to [employer]   Tobacco Use: Low Risk (11/28/2024)   Patient History    Smoking Tobacco Use: Never    Smokeless Tobacco Use: Never    Passive Exposure: Not on file  Financial Resource Strain: Low Risk (11/22/2024)  Overall Financial Resource Strain (CARDIA)    Difficulty of Paying Living Expenses: Not hard at all  Food Insecurity: No Food Insecurity (11/23/2024)   Epic    Worried About Programme Researcher, Broadcasting/film/video in the Last Year: Never true    Ran Out of Food in the Last Year: Never true  Transportation Needs: No Transportation Needs (11/23/2024)   Epic    Lack of Transportation (Medical): No    Lack of Transportation (Non-Medical): No  Physical Activity: Inactive (11/23/2024)   Exercise Vital Sign    Days of Exercise per Week: 0 days    Minutes of Exercise per Session: Not on file  Stress: No Stress Concern Present (11/23/2024)   [employer] of Occupational Health - Occupational Stress Questionnaire    Feeling of Stress: Not at all  Social Connections: Moderately Integrated (11/23/2024)   Social Connection and Isolation Panel    Frequency of Communication with Friends and Family: More than three times a week    Frequency of Social Gatherings with Friends and Family: More than three times a week    Attends Religious Services: More than 4 times per year    Active Member of [employer] or Organizations: Yes    Attends Banker Meetings: More than 4 times per year    Marital  Status: Widowed  Intimate Partner Violence: Not At Risk (11/23/2024)   Epic    Fear of Current or Ex-Partner: No    Emotionally Abused: No    Physically Abused: No    Sexually Abused: No  Depression (PHQ2-9): Low Risk (11/23/2024)   Depression (PHQ2-9)    PHQ-2 Score: 0  Alcohol Screen: Not on file  Housing: Unknown (11/23/2024)   Epic    Unable to Pay for Housing in the Last Year: No    Number of Times Moved in the Last Year: Not on file    Homeless in the Last Year: No  Utilities: At Risk (11/23/2024)   Epic    Threatened with loss of utilities: Yes  Health Literacy: Adequate Health Literacy (11/23/2024)   B1300 Health Literacy    Frequency of need for help with medical instructions: Never    Allergies: Allergies  Allergen Reactions   Nitrofurantoin  Diarrhea, Nausea Only and Other (See Comments)   Sulfa Antibiotics Nausea Only and Other (See Comments)    Current Medications: Current Outpatient Medications  Medication Sig Dispense Refill   ACCU-CHEK GUIDE TEST test strip daily.     acetaminophen  (TYLENOL ) 500 MG tablet Take 1 tablet (500 mg total) by mouth 3 (three) times daily. 30 tablet 0   Blood Glucose Monitoring Suppl (ONE TOUCH ULTRA 2) w/Device KIT 1 EACH BY DOES NOT APPLY ROUTE DAILY. 1 kit 0   CRANBERRY PO Take 1 capsule by mouth 2 (two) times daily.      cyanocobalamin  (,VITAMIN B-12,) 1000 MCG/ML injection Inject 1,000 mcg into the muscle every 30 (thirty) days.      diltiazem  (CARDIZEM  CD) 180 MG 24 hr capsule Take 1 capsule (180 mg total) by mouth 2 (two) times daily. 180 capsule 3   docusate sodium  (COLACE) 100 MG capsule Take 1 capsule (100 mg total) by mouth 2 (two) times daily. 10 capsule 0   doxazosin  (CARDURA ) 1 MG tablet Please take 2 MG ( 2 tablets) in the AM and 1 MG ( 1 tablet) in the evening. 270 tablet 3   ELIQUIS  5 MG TABS tablet TAKE 1 TABLET BY MOUTH TWICE A DAY 180 tablet 3  furosemide  (LASIX ) 20 MG tablet Take 1 tablet (20 mg total) by mouth  daily as needed for fluid or edema (SOB). 90 tablet 3   LACTOBACILLUS PO Take 1 tablet by mouth daily at 6 (six) AM.     Lancet Device MISC Use to check blood sugars once daily. 1 each 0   Lancets Misc. MISC Use to check blood sugars once daily. 100 each 12   metFORMIN  (GLUCOPHAGE ) 500 MG tablet Take 1 tablet (500 mg total) by mouth 2 (two) times daily with a meal. 180 tablet 1   olmesartan  (BENICAR ) 40 MG tablet TAKE 1 TABLET BY MOUTH EVERY DAY 90 tablet 3   omeprazole  (PRILOSEC) 20 MG capsule Take 1 capsule (20 mg total) by mouth daily. 90 capsule 1   ondansetron  (ZOFRAN ) 8 MG tablet Take 1 tablet (8 mg total) by mouth 2 (two) times daily as needed for nausea or vomiting. 20 tablet 0   polyethylene glycol powder (GLYCOLAX /MIRALAX ) 17 GM/SCOOP powder Take 17 g by mouth daily as needed for mild constipation. 238 g 0   Trolamine Salicylate (BLUE-EMU HEMP EX) Apply 1 application topically daily as needed (Knee pain).     ezetimibe  (ZETIA ) 10 MG tablet Take 1 tablet (10 mg total) by mouth daily. (Patient not taking: Reported on 11/28/2024) 90 tablet 3   nystatin  cream (MYCOSTATIN ) Apply 1 Application topically 2 (two) times daily. (Patient not taking: Reported on 11/28/2024) 30 g 0   No current facility-administered medications for this visit.   Review of Systems General:  no complaints Skin: no complaints Eyes: no complaints HEENT: no complaints Breasts: no complaints Pulmonary: no complaints Cardiac: no complaints Gastrointestinal: no complaints Genitourinary/Sexual: bladder leakage.  Ob/Gyn: no complaints Musculoskeletal: no complaints Hematology: no complaints Neurologic/Psych: no complaints   Objective:  Physical Examination:  BP (!) 160/82   Pulse 88   Temp 98.6 F (37 C)   Resp 20   Wt 175 lb 6.4 oz (79.6 kg)   SpO2 100%   BMI 28.31 kg/m    ECOG Performance Status: 0 - Asymptomatic  GENERAL: Patient is a well appearing female in no acute distress HEENT:  Sclera  clear. Anicteric NODES:  Negative axillary, supraclavicular, inguinal lymph node survery LUNGS:  Clear to auscultation bilaterally.   HEART:  Regular rate and rhythm.  ABDOMEN:  Soft, nontender.  No hernias, incisions well healed. No masses or ascites EXTREMITIES:  No peripheral edema. Atraumatic. No cyanosis SKIN:  Clear with no obvious rashes or skin changes.  NEURO:  Nonfocal. Well oriented.  Appropriate affect.  Pelvic: Exam chaperoned by CMA EGBUS/Vagina: normal. No radiation reaction or necrosis. Vaginal skin tag on lower right wall, but looks completely benign. No discharge or blood.  Bimanual: normal.  Lab Review No labs on site today.  Radiologic Imaging: No imaging on site.     Assessment:  Khyler  B Natzke is a 89 y.o. female diagnosed with PMB diagnosed 6/24 with Stage IIIC1 grade 1 endometrioid endometrial cancer with deficient mismatch repair protein expression (Loss of MLH1 and PMS2 with methylated MLH1 alleles) on endometrial biopsy.  In 07/27/23 underwent TLH, BSO, SLN mapping and biopsies with Dr Mancil and Dr Verdon at [facility].  Pathology showed Stage IB grade 2 adenocarcinoma with 8/10 mm invasion with LVSI and cervical stromal involvement. Bilateral SLNs with ITCs. Washings negative. Received adjuvant pelvic RT and vaginal brachy completed 10/28/23. NED since then.   MLH1: LOSS OF NUCLEAR EXPRESSION MSH2: Preserved nuclear expression MSH6: LOSS OF NUCLEAR EXPRESSION PMS2:  LOSS OF NUCLEAR EXPRESSION Methylated MLH1 alleles present cw sporadic uterine cancer.  No explanation of loss of MSH6 but germline genetic testing negative for mutation. No MSH6 or other pathogenic mutation.   TP53 WT  Personal and family history of malignancy including breast and ovarian cancer.  Negative [facility]ae 84 gene germline extended panel test with RNA 8/24 due to DCIS.   Medical co-morbidities complicating care: diabetes. Non-Hodgkin lymphoma and breast cancer (DCIS) status post  radiation therapy, Hypothyroidism, a fib- on anticoagulation.  Plan:   Problem List Items Addressed This Visit   None Visit Diagnoses       Encounter for follow-up surveillance of endometrial cancer    -  Primary      She is doing well.  Today, we discussed symptoms that would be concerning for recurrence and warrant sooner return.   In reviewing her pathology report today it was noted that there was never an explanation found for loss of MSH6 expression.  In view of this will ask for the MSH6 IHC slide to be reviewed again to make sure there was really loss.  Reported loss on report could have been in error since no MSH6 mutation was found. If it is determined that MSH6 was lost then will discuss at [facility] conference to see if any further work up needed.   Barbara Dawn, DNP, AGNP-C, AOCNP [facility] [phone] (clinic)  Addendum: spoke to Pathologist about MSH6 loss being unexpected.  He repeated the IHC and MSH6 preserved.  So no further workup needed.   I personally had a face to face interaction and evaluated the patient jointly with the NP, [name].  I have reviewed her history and available records and have performed the key portions of the physical exam including lymph node survey, abdominal exam, pelvic exam with my findings confirming those documented above by the APP.  I have discussed the case with the APP and the patient.  I agree with the above documentation, assessment and plan which was fully formulated by me.  Counseling was completed by me.   I personally saw the patient and performed a substantive portion of this encounter in conjunction with the listed APP as documented above.  Prentice Agent, MD   CC: Dr. Verdon, Dr Lenn

## 2024-11-28 NOTE — Progress Notes (Signed)
 Orders placed.

## 2024-11-29 ENCOUNTER — Other Ambulatory Visit (INDEPENDENT_AMBULATORY_CARE_PROVIDER_SITE_OTHER)

## 2024-11-29 ENCOUNTER — Other Ambulatory Visit: Payer: Self-pay

## 2024-11-29 ENCOUNTER — Encounter: Payer: Self-pay | Admitting: Internal Medicine

## 2024-11-29 ENCOUNTER — Telehealth: Admitting: Internal Medicine

## 2024-11-29 DIAGNOSIS — R300 Dysuria: Secondary | ICD-10-CM | POA: Diagnosis not present

## 2024-11-29 LAB — URINALYSIS, ROUTINE W REFLEX MICROSCOPIC
Bilirubin Urine: NEGATIVE
Hgb urine dipstick: NEGATIVE
Ketones, ur: NEGATIVE
Leukocytes,Ua: NEGATIVE
Nitrite: NEGATIVE
RBC / HPF: NONE SEEN (ref 0–?)
Specific Gravity, Urine: 1.01 (ref 1.000–1.030)
Total Protein, Urine: NEGATIVE
Urine Glucose: NEGATIVE
Urobilinogen, UA: 0.2 (ref 0.0–1.0)
pH: 7.5 (ref 5.0–8.0)

## 2024-11-29 NOTE — Telephone Encounter (Signed)
 Noted

## 2024-11-29 NOTE — Telephone Encounter (Unsigned)
 Copied from CRM [id number]. Topic: Clinical - Medical Advice >> Nov 29, 2024  9:04 AM Viola FALCON wrote: Reason for CRM: Patient daughter [name] called regarding patients UTI - she's having frequent/burning urination, odor, and cold sensation. She has recurring UTI's and usually is asked to come in for specimen. Please call [name] at [phone] [facility])

## 2024-11-29 NOTE — Telephone Encounter (Signed)
 Please confirm symptoms. Ok for international paper and culture and virtual tomorrow am.

## 2024-11-30 ENCOUNTER — Telehealth: Admitting: Internal Medicine

## 2024-11-30 ENCOUNTER — Encounter: Payer: Self-pay | Admitting: Internal Medicine

## 2024-11-30 ENCOUNTER — Ambulatory Visit: Payer: Self-pay | Admitting: Internal Medicine

## 2024-11-30 VITALS — BP 155/83 | HR 90 | Ht 66.0 in | Wt 175.0 lb

## 2024-11-30 DIAGNOSIS — N1831 Chronic kidney disease, stage 3a: Secondary | ICD-10-CM

## 2024-11-30 DIAGNOSIS — R300 Dysuria: Secondary | ICD-10-CM

## 2024-11-30 DIAGNOSIS — I480 Paroxysmal atrial fibrillation: Secondary | ICD-10-CM

## 2024-11-30 DIAGNOSIS — I10 Essential (primary) hypertension: Secondary | ICD-10-CM | POA: Diagnosis not present

## 2024-11-30 DIAGNOSIS — E782 Mixed hyperlipidemia: Secondary | ICD-10-CM

## 2024-11-30 DIAGNOSIS — C8590 Non-Hodgkin lymphoma, unspecified, unspecified site: Secondary | ICD-10-CM

## 2024-11-30 NOTE — Progress Notes (Signed)
 Patient ID: Barbara Mooney, female   DOB: 1934-05-31, 89 y.o.   MRN: [id number]   Virtual Visit via vidoe Note  I connected with [facility] by a video enabled telemedicine application and verified that I am speaking with the correct person using two identifiers. Location patient: home Location provider: work Persons participating in the virtual visit: patient, provider and pts daughter Jacquelynne).   The limitations, risks, security and privacy concerns of performing an evaluation and management service by vidoe and the availability of in person appointments have been discussed. It has also been discussed with the patient that there may be a patient responsible charge related to this service. The patient expressed understanding and agreed to proceed.   Reason for visit: work in appt  HPI: Work in appt with concerns regarding possible UTI. Had called in with concerns regarding some fatigue, burning and cold shivers. Symptoms that are classic for her when she has a UTI. Daughter had brought in a specimen to confirm if infection present. On questioning today, she is not having any symptoms. The burning, chills - have resolved. She is currently at her baseline. Initial urine looked ok. Culture pending at time of visit. Eating. No vomiting.    ROS: See pertinent positives and negatives per HPI.  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   a.) stage 0 (high grade cTis (DCIS), cN0, cM0) with comedonecrosis -- > s/p lumpectomy + XRT   Endometrial cancer (HCC) 05/25/2023   a.) pathology (+) for well differentiated endometroid carcinoma (FIGO 1)   Follicular non-Hodgkin's lymphoma (HCC) 2018   a.) stage I E follicular lymphoma  consistent with B-cell germinal center origin of the RIGHT proximal tibia s/p XRT   GERD (gastroesophageal reflux disease)    History of hiatal hernia    HLD (hyperlipidemia)    HOH (hard of hearing)    Hypertension    Hypothyroidism    Mixed incontinence    MRSA infection    a.) remote history; posterior torso   Multiple thyroid  nodules    Peripheral edema    Permanent atrial fibrillation (HCC) 10/2016   a.) CHA2DS2VASc = 6 (age x2, sex, HTN, vascular disease history, T2DM);  b.) s/p DCCV 01/20/2017 (150 J x 1) --> failed; c.) rate/rhythm maintained on oral diltiazem ; chronically anticoagulated with apixaban    RBBB (right bundle branch block)    Recurrent UTI (urinary tract infection)    Unsteady gait    Varicose veins of both lower extremities    Vitamin B12 deficiency     Past Surgical History:  Procedure Laterality Date   ABDOMINAL HYSTERECTOMY  2024   BONE BIOPSY Right 2018   tibia   BREAST BIOPSY Right 02/26/2021   ffirm bx-X clip-HIGH-GRADE DUCTAL CARCINOMA IN SITU   BREAST LUMPECTOMY Right 03/09/2021   Procedure: BREAST LUMPECTOMY;  Surgeon: Dessa Reyes ORN, MD;  Location: [facility];  Service: General;  Laterality: Right;   CARDIOVERSION N/A 01/20/2017   Procedure: CARDIOVERSION;  Surgeon: Evalene JINNY Lunger, MD;  Location: [facility];  Service: Cardiovascular;  Laterality: N/A;   CATARACT EXTRACTION W/PHACO Right 02/15/2018   Procedure: CATARACT EXTRACTION PHACO AND INTRAOCULAR LENS  PLACEMENT (IOC);  Surgeon: Jaye Fallow, MD;  Location: [facility];  Service: Ophthalmology;  Laterality: Right;  US  01:22.8 AP% 18.9 CDE 15.62 Fluid Pack Lot # [id number]   CATARACT EXTRACTION W/PHACO Left 03/08/2018   Procedure: CATARACT EXTRACTION PHACO AND INTRAOCULAR LENS PLACEMENT (IOC);  Surgeon: Jaye Fallow, MD;  Location: [facility];  Service: Ophthalmology;  Laterality: Left;  US  01:05 AP% 15.8 CDE 10.28 Fluid pak lot # [id number]   COLONOSCOPY     EYE SURGERY     Catara  you   TOTAL LAPAROSCOPIC HYSTERECTOMY WITH BILATERAL SALPINGO OOPHORECTOMY N/A 07/27/2023   Procedure: TOTAL LAPAROSCOPIC HYSTERECTOMY WITH BILATERAL SALPINGO OOPHORECTOMY, SENTINEL LYMPH NODE INJECTION AND MAPPING, NODE DISSECTION;  Surgeon: Mancil Barter, MD;  Location: [facility];  Service: Gynecology;  Laterality: N/A;    Family History  Problem Relation Age of Onset   Diabetes Mother    Arthritis Father    Hypertension Father    Breast cancer Sister 81   Stroke Sister    Atrial fibrillation Sister    Cancer Brother    Heart disease Brother    Ovarian cancer Maternal Aunt    Colon cancer Maternal Aunt    Kidney cancer Neg Hx    Bladder Cancer Neg Hx     SOCIAL HX: reviewed.   Current Medications[1]  EXAM:  GENERAL: alert, oriented, appears well and in no acute distress  HEENT: atraumatic, conjunttiva clear, no obvious abnormalities on inspection of external nose and ears  NECK: normal movements of the head and neck  LUNGS: on inspection no signs of respiratory distress, breathing rate appears normal, no obvious gross SOB, gasping or wheezing  CV: no obvious cyanosis  PSYCH/NEURO: pleasant and cooperative, no obvious depression or anxiety, speech and thought processing grossly intact  ASSESSMENT AND PLAN:  Discussed the following assessment and plan:  Problem List Items Addressed This Visit     Type 2 diabetes mellitus with stage 3 chronic kidney disease, without long-term current use of insulin  (HCC) - Primary   Continue metformin . Follow met b and A1c.       Paroxysmal atrial fibrillation (HCC)   Continues on eliquis  and diltiazem . Followed by Dr Gollan.       Non-Hodgkin lymphoma (HCC)   Treated at [facility].       Hyperlipemia, mixed   Low cholesterol diet and exercise as tolerated. Dr Gollan recommended starting zetia . Follow lipid panel.  Lab Results  Component Value Date   CHOL 210 (H) 10/12/2024   HDL 51.70 10/12/2024   LDLCALC 135 (H) 10/12/2024    TRIG 118.0 10/12/2024   CHOLHDL 4 10/12/2024         Essential hypertension   Continue benicar  and diltiazem . Follow pressures.       Dysuria   Previous burning and chills as outlined. Symptoms have resolved now. Initial urinalysis - ok. Hold on abx. Stay hydrated. Follow. Waiting for culture results. Notify if symptoms return.        Return if symptoms worsen or fail to improve.   I discussed the assessment and treatment plan with the patient. The patient was provided an opportunity to ask questions and all were answered. The patient agreed with the plan and demonstrated an understanding of the instructions.   The patient was advised to call back or seek an in-person evaluation if the symptoms worsen or if the condition fails to improve as anticipated.    Allena Hamilton, MD       [1]  Current Outpatient  Medications:    ACCU-CHEK GUIDE TEST test strip, daily., Disp: , Rfl:    acetaminophen  (TYLENOL ) 500 MG tablet, Take 1 tablet (500 mg total) by mouth 3 (three) times daily., Disp: 30 tablet, Rfl: 0   Blood Glucose Monitoring Suppl (ONE TOUCH ULTRA 2) w/Device KIT, 1 EACH BY DOES NOT APPLY ROUTE DAILY., Disp: 1 kit, Rfl: 0   CRANBERRY PO, Take 1 capsule by mouth 2 (two) times daily. , Disp: , Rfl:    cyanocobalamin  (,VITAMIN B-12,) 1000 MCG/ML injection, Inject 1,000 mcg into the muscle every 30 (thirty) days. , Disp: , Rfl:    diltiazem  (CARDIZEM  CD) 180 MG 24 hr capsule, Take 1 capsule (180 mg total) by mouth 2 (two) times daily., Disp: 180 capsule, Rfl: 3   docusate sodium  (COLACE) 100 MG capsule, Take 1 capsule (100 mg total) by mouth 2 (two) times daily., Disp: 10 capsule, Rfl: 0   doxazosin  (CARDURA ) 1 MG tablet, Please take 2 MG ( 2 tablets) in the AM and 1 MG ( 1 tablet) in the evening., Disp: 270 tablet, Rfl: 3   ELIQUIS  5 MG TABS tablet, TAKE 1 TABLET BY MOUTH TWICE A DAY, Disp: 180 tablet, Rfl: 3   furosemide  (LASIX ) 20 MG tablet, Take 1 tablet (20 mg total) by  mouth daily as needed for fluid or edema (SOB)., Disp: 90 tablet, Rfl: 3   LACTOBACILLUS PO, Take 1 tablet by mouth daily at 6 (six) AM., Disp: , Rfl:    Lancet Device MISC, Use to check blood sugars once daily., Disp: 1 each, Rfl: 0   Lancets Misc. MISC, Use to check blood sugars once daily., Disp: 100 each, Rfl: 12   metFORMIN  (GLUCOPHAGE ) 500 MG tablet, Take 1 tablet (500 mg total) by mouth 2 (two) times daily with a meal., Disp: 180 tablet, Rfl: 1   olmesartan  (BENICAR ) 40 MG tablet, TAKE 1 TABLET BY MOUTH EVERY DAY, Disp: 90 tablet, Rfl: 3   omeprazole  (PRILOSEC) 20 MG capsule, Take 1 capsule (20 mg total) by mouth daily., Disp: 90 capsule, Rfl: 1   ondansetron  (ZOFRAN ) 8 MG tablet, Take 1 tablet (8 mg total) by mouth 2 (two) times daily as needed for nausea or vomiting., Disp: 20 tablet, Rfl: 0   polyethylene glycol powder (GLYCOLAX /MIRALAX ) 17 GM/SCOOP powder, Take 17 g by mouth daily as needed for mild constipation., Disp: 238 g, Rfl: 0   Trolamine Salicylate (BLUE-EMU HEMP EX), Apply 1 application topically daily as needed (Knee pain)., Disp: , Rfl:    ezetimibe  (ZETIA ) 10 MG tablet, Take 1 tablet (10 mg total) by mouth daily. (Patient not taking: Reported on 11/30/2024), Disp: 90 tablet, Rfl: 3

## 2024-12-01 ENCOUNTER — Encounter: Payer: Self-pay | Admitting: Cardiovascular Disease

## 2024-12-02 LAB — URINE CULTURE
MICRO NUMBER:: 17373676
SPECIMEN QUALITY:: ADEQUATE

## 2024-12-07 ENCOUNTER — Encounter: Payer: Self-pay | Admitting: Internal Medicine

## 2024-12-07 NOTE — Progress Notes (Signed)
 Patient ID: Barbara  B Mooney, female   DOB: 1934/07/28, 89 y.o.   MRN: [id number] Did not show for appt.

## 2024-12-09 ENCOUNTER — Encounter: Payer: Self-pay | Admitting: Internal Medicine

## 2024-12-09 NOTE — Assessment & Plan Note (Signed)
 Continues on eliquis  and diltiazem . Followed by Dr Gollan.

## 2024-12-09 NOTE — Assessment & Plan Note (Signed)
 Continue benicar  and diltiazem . Follow pressures.

## 2024-12-09 NOTE — Assessment & Plan Note (Signed)
 Continue metformin . Follow met b and A1c.

## 2024-12-09 NOTE — Assessment & Plan Note (Signed)
 Low cholesterol diet and exercise as tolerated. Dr Gollan recommended starting zetia. Follow lipid panel.  Lab Results  Component Value Date   CHOL 210 (H) 10/12/2024   HDL 51.70 10/12/2024   LDLCALC 135 (H) 10/12/2024   TRIG 118.0 10/12/2024   CHOLHDL 4 10/12/2024

## 2024-12-09 NOTE — Assessment & Plan Note (Signed)
 Previous burning and chills as outlined. Symptoms have resolved now. Initial urinalysis - ok. Hold on abx. Stay hydrated. Follow. Waiting for culture results. Notify if symptoms return.

## 2024-12-09 NOTE — Assessment & Plan Note (Signed)
 Treated at [facility].

## 2024-12-10 MED ORDER — DOXAZOSIN MESYLATE 1 MG PO TABS: ORAL_TABLET | ORAL | 3 refills | Status: DC

## 2025-01-01 ENCOUNTER — Other Ambulatory Visit: Payer: Self-pay | Admitting: Cardiovascular Disease

## 2025-01-02 ENCOUNTER — Ambulatory Visit

## 2025-01-02 DIAGNOSIS — E538 Deficiency of other specified B group vitamins: Secondary | ICD-10-CM

## 2025-01-02 MED ORDER — CYANOCOBALAMIN 1000 MCG/ML IJ SOLN
1000.0000 ug | Freq: Once | INTRAMUSCULAR | Status: AC
  Administered 2025-01-02: 1000 ug via INTRAMUSCULAR

## 2025-01-02 NOTE — Progress Notes (Signed)
 Pt received B12 injection in Left  deltoid muscle. Pt tolerated it well with no complaints or concerns.

## 2025-01-04 ENCOUNTER — Ambulatory Visit

## 2025-01-04 ENCOUNTER — Telehealth: Payer: Self-pay | Admitting: Cardiovascular Disease

## 2025-01-04 DIAGNOSIS — Z79899 Other long term (current) drug therapy: Secondary | ICD-10-CM

## 2025-01-04 MED ORDER — HYDRALAZINE HCL 25 MG PO TABS: 25.0000 mg | ORAL_TABLET | Freq: Three times a day (TID) | ORAL | 3 refills | Status: DC

## 2025-01-04 MED ORDER — DILTIAZEM HCL ER COATED BEADS 180 MG PO CP24: 180.0000 mg | ORAL_CAPSULE | Freq: Every morning | ORAL | 3 refills | Status: AC

## 2025-01-04 MED ORDER — METOPROLOL SUCCINATE ER 50 MG PO TB24: 50.0000 mg | ORAL_TABLET | Freq: Every evening | ORAL | 3 refills | Status: AC

## 2025-01-04 NOTE — Telephone Encounter (Signed)
 Pt daughter calling to clarify instructions.

## 2025-01-04 NOTE — Telephone Encounter (Signed)
 Pt c/o swelling: STAT is pt has developed SOB within 24 hours  How much weight have you gained and in what time span? Unsure but in low 170's  If swelling, where is the swelling located? Both legs, definite increase and left leg from ankle to knee had redness  Are you currently taking a fluid pill? Yes   Are you currently SOB? No   Do you have a log of your daily weights (if so, list)? No  Have you gained 3 pounds in a day or 5 pounds in a week? Unsure   Have you traveled recently? No

## 2025-01-04 NOTE — Telephone Encounter (Signed)
 Called daughter for further information : no pitting, no warmth, not streaky red, pt has been up more today preparing for weekend weather, taking 20 mg of lasix  as needed for swelling, sitting with feet elevated when she can  Swelling has become more persistent since starting doxazosin  - daughter is asking if doxy needs to be adjusted  There is a 3:40 DOD appt today if needed

## 2025-01-18 MED ORDER — HYDRALAZINE HCL 50 MG PO TABS: 50.0000 mg | ORAL_TABLET | Freq: Three times a day (TID) | ORAL | 3 refills | Status: AC

## 2025-01-18 NOTE — Telephone Encounter (Signed)
 Called and spoke with daughter per DPR. Notified her of the following per Dr. Gollan.  Blood pressure numbers still running a little bit high Would increase hydralazine  up to 50 mg 3 times daily Continue Lasix  daily for now but we need a BMP Thx TGollan  Daughter verbalizes understanding. Prescription sent to preferred pharmacy and order placed for BMP.

## 2025-01-28 ENCOUNTER — Other Ambulatory Visit

## 2025-01-31 ENCOUNTER — Ambulatory Visit: Admitting: Internal Medicine

## 2025-02-06 ENCOUNTER — Ambulatory Visit

## 2025-03-06 ENCOUNTER — Encounter

## 2025-03-06 ENCOUNTER — Other Ambulatory Visit

## 2025-04-24 ENCOUNTER — Ambulatory Visit (INDEPENDENT_AMBULATORY_CARE_PROVIDER_SITE_OTHER): Admitting: Nurse Practitioner

## 2025-05-29 ENCOUNTER — Inpatient Hospital Stay

## 2025-11-26 ENCOUNTER — Ambulatory Visit
